# Patient Record
Sex: Female | Born: 1935 | Race: White | Hispanic: No | Marital: Married | Smoking: Former smoker
Health system: Southern US, Community
[De-identification: ages and names within clinical notes are randomized; demographics above are authoritative.]

## PROBLEM LIST (undated history)

## (undated) DIAGNOSIS — D649 Anemia, unspecified: Secondary | ICD-10-CM

## (undated) DIAGNOSIS — F329 Major depressive disorder, single episode, unspecified: Secondary | ICD-10-CM

## (undated) DIAGNOSIS — E538 Deficiency of other specified B group vitamins: Secondary | ICD-10-CM

## (undated) DIAGNOSIS — R7303 Prediabetes: Secondary | ICD-10-CM

## (undated) DIAGNOSIS — D509 Iron deficiency anemia, unspecified: Secondary | ICD-10-CM

## (undated) DIAGNOSIS — E871 Hypo-osmolality and hyponatremia: Secondary | ICD-10-CM

## (undated) DIAGNOSIS — F0390 Unspecified dementia without behavioral disturbance: Secondary | ICD-10-CM

## (undated) DIAGNOSIS — M1990 Unspecified osteoarthritis, unspecified site: Secondary | ICD-10-CM

## (undated) DIAGNOSIS — I10 Essential (primary) hypertension: Secondary | ICD-10-CM

## (undated) HISTORY — PX: COLONOSCOPY: SHX174

## (undated) HISTORY — PX: ABDOMINAL HYSTERECTOMY: SHX81

## (undated) HISTORY — PX: HEMANGIOMA EXCISION: SHX1734

## (undated) HISTORY — DX: Unspecified osteoarthritis, unspecified site: M19.90

## (undated) HISTORY — PX: JOINT REPLACEMENT: SHX530

## (undated) HISTORY — PX: TUBAL LIGATION: SHX77

## (undated) HISTORY — PX: TONSILLECTOMY: SUR1361

---

## 1998-08-11 ENCOUNTER — Ambulatory Visit (HOSPITAL_COMMUNITY): Admission: RE | Admit: 1998-08-11 | Discharge: 1998-08-11 | Payer: Self-pay | Admitting: *Deleted

## 1999-05-16 ENCOUNTER — Ambulatory Visit (HOSPITAL_COMMUNITY): Admission: RE | Admit: 1999-05-16 | Discharge: 1999-05-16 | Payer: Self-pay | Admitting: *Deleted

## 1999-05-25 ENCOUNTER — Ambulatory Visit (HOSPITAL_COMMUNITY): Admission: RE | Admit: 1999-05-25 | Discharge: 1999-05-25 | Payer: Self-pay | Admitting: *Deleted

## 1999-09-09 ENCOUNTER — Ambulatory Visit (HOSPITAL_COMMUNITY): Admission: RE | Admit: 1999-09-09 | Discharge: 1999-09-09 | Payer: Self-pay | Admitting: Emergency Medicine

## 1999-09-09 ENCOUNTER — Encounter: Payer: Self-pay | Admitting: Emergency Medicine

## 2000-01-30 ENCOUNTER — Encounter: Payer: Self-pay | Admitting: Internal Medicine

## 2000-01-30 ENCOUNTER — Ambulatory Visit (HOSPITAL_COMMUNITY): Admission: RE | Admit: 2000-01-30 | Discharge: 2000-01-30 | Payer: Self-pay | Admitting: Internal Medicine

## 2001-05-26 ENCOUNTER — Encounter: Payer: Self-pay | Admitting: Internal Medicine

## 2001-05-26 ENCOUNTER — Ambulatory Visit (HOSPITAL_COMMUNITY): Admission: RE | Admit: 2001-05-26 | Discharge: 2001-05-26 | Payer: Self-pay | Admitting: Internal Medicine

## 2002-03-10 ENCOUNTER — Other Ambulatory Visit: Admission: RE | Admit: 2002-03-10 | Discharge: 2002-03-10 | Payer: Self-pay | Admitting: Internal Medicine

## 2002-08-18 ENCOUNTER — Ambulatory Visit (HOSPITAL_COMMUNITY): Admission: RE | Admit: 2002-08-18 | Discharge: 2002-08-18 | Payer: Self-pay | Admitting: Internal Medicine

## 2002-11-08 ENCOUNTER — Encounter: Payer: Self-pay | Admitting: Emergency Medicine

## 2002-11-08 ENCOUNTER — Emergency Department (HOSPITAL_COMMUNITY): Admission: EM | Admit: 2002-11-08 | Discharge: 2002-11-08 | Payer: Self-pay | Admitting: Emergency Medicine

## 2003-09-09 ENCOUNTER — Ambulatory Visit (HOSPITAL_COMMUNITY): Admission: RE | Admit: 2003-09-09 | Discharge: 2003-09-09 | Payer: Self-pay | Admitting: Internal Medicine

## 2003-09-14 ENCOUNTER — Encounter (INDEPENDENT_AMBULATORY_CARE_PROVIDER_SITE_OTHER): Payer: Self-pay | Admitting: *Deleted

## 2003-09-14 ENCOUNTER — Ambulatory Visit (HOSPITAL_COMMUNITY): Admission: RE | Admit: 2003-09-14 | Discharge: 2003-09-14 | Payer: Self-pay | Admitting: *Deleted

## 2003-11-02 ENCOUNTER — Ambulatory Visit (HOSPITAL_COMMUNITY): Admission: RE | Admit: 2003-11-02 | Discharge: 2003-11-02 | Payer: Self-pay | Admitting: Internal Medicine

## 2004-02-18 ENCOUNTER — Ambulatory Visit (HOSPITAL_COMMUNITY): Admission: RE | Admit: 2004-02-18 | Discharge: 2004-02-18 | Payer: Self-pay | Admitting: Orthopedic Surgery

## 2004-12-12 ENCOUNTER — Ambulatory Visit (HOSPITAL_COMMUNITY): Admission: RE | Admit: 2004-12-12 | Discharge: 2004-12-12 | Payer: Self-pay | Admitting: Internal Medicine

## 2004-12-26 ENCOUNTER — Encounter: Admission: RE | Admit: 2004-12-26 | Discharge: 2004-12-26 | Payer: Self-pay | Admitting: Internal Medicine

## 2005-09-04 ENCOUNTER — Encounter: Admission: RE | Admit: 2005-09-04 | Discharge: 2005-09-04 | Payer: Self-pay | Admitting: Internal Medicine

## 2006-01-15 ENCOUNTER — Ambulatory Visit (HOSPITAL_COMMUNITY): Admission: RE | Admit: 2006-01-15 | Discharge: 2006-01-15 | Payer: Self-pay | Admitting: Internal Medicine

## 2007-02-03 ENCOUNTER — Ambulatory Visit (HOSPITAL_COMMUNITY): Admission: RE | Admit: 2007-02-03 | Discharge: 2007-02-03 | Payer: Self-pay | Admitting: *Deleted

## 2007-03-17 ENCOUNTER — Ambulatory Visit (HOSPITAL_COMMUNITY): Admission: RE | Admit: 2007-03-17 | Discharge: 2007-03-17 | Payer: Self-pay | Admitting: Internal Medicine

## 2010-07-11 NOTE — Op Note (Signed)
Amy Carroll, WENZ            ACCOUNT NO.:  [id number]   MEDICAL RECORD NO.:  [id number]          PATIENT TYPE:  AMB   LOCATION:  ENDO                         FACILITY:  [facility]   PHYSICIAN:  Georgiana Spinner, M.D.    DATE OF BIRTH:  12/05/1934   DATE OF PROCEDURE:  02/03/2007  DATE OF DISCHARGE:                               OPERATIVE REPORT   PROCEDURE:  Colon cancer screening.   ANESTHESIA:  Fentanyl 100 mcg, Versed 8 mg.   PROCEDURE:  With patient mildly sedated in the left lateral decubitus  position, the Pentax videoscopic colonoscope was inserted in the rectum,  passed under direct vision to the cecum, identified by ileocecal valve  and base of cecum, both of which were photographed.  In the cecum were  AVMs that were photographed.  From this point, the endoscope was slowly  withdrawn, taking circumferential views of colonic mucosa, as we  withdrew all the way to the rectum stopping in the sigmoid colon where  diverticulosis was noted and photographed, stopping again in the rectum  which appeared normal on direct and showed hemorrhoids on retroflexed  view.  The endoscope was straightened and withdrawn.  The patient's  vital signs and pulse oximeter remained stable.  The patient tolerated  the procedure well without apparent complications.   FINDINGS:  Diverticulosis of sigmoid colon, AVMs of cecum and internal  hemorrhoids.   PLAN:  Have patient follow-up with me in 5 years or as needed.  Of note,  the patient took half the prep.  There were some areas where small  polyps could be missed because of brownish stool liquid that could be  fully suctioned were encountered.           ______________________________  Georgiana Spinner, M.D.     GMO/MEDQ  D:  02/03/2007  T:  02/03/2007  Job:  [id number]

## 2010-07-14 NOTE — Op Note (Signed)
NAME:  Amy Carroll, Amy Carroll                      ACCOUNT NO.:  [id number]   MEDICAL RECORD NO.:  [id number]                   PATIENT TYPE:  AMB   LOCATION:  ENDO                                 FACILITY:  [facility]   PHYSICIAN:  Georgiana Spinner, M.D.                 DATE OF BIRTH:  1934/10/26   DATE OF PROCEDURE:  09/14/2003  DATE OF DISCHARGE:                                 OPERATIVE REPORT   PROCEDURE:  Colonoscopy.   INDICATIONS FOR PROCEDURE:  Colon polyp.   ANESTHESIA:  Demerol 80, Versed 8 mg.   DESCRIPTION OF PROCEDURE:  The patient mildly sedated in the left lateral  decubitus position, the Olympus videoscopic colonoscope was inserted in the  rectum and passed through a tortuous diverticula filled sigmoid colon to the  cecum.  Identified by base of cecum and ileocecal valve.  Of note, he had  two separate, fairly large, AVMs noted.  These were photographed only since  there was no evidence of bleeding and the colonoscope was then slowly  withdrawn, taking circumferential views of the remaining colonic mucosa,  stopping at 20 cm on the way as we pulled back into the rectum to view a  polyp which was photographed and removed using snare cautery technique,  Setting of 20/200 blended current.  Tissue was retrieved for pathology.  The  endoscope was pulled back to the rectum which appeared normal and showed  small hemorrhoids on retroflexed view.  The endoscope was straightened and  withdrawn.  The patient's vital signs and pulse oximeter remained stable.  The patient tolerated the procedure well without apparent complications.   FINDINGS:  Mild to moderately severe diverticulosis of sigmoid colon,  internal hemorrhoids and polyp at 20 cm from the anal verge removed.  Await  biopsy report.  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   PLAN:  Await biopsy report.  The patient will call me for results and follow  up with me as an  outpatient and we will discuss her problems with her when  we see her as an outpatient.                                               Georgiana Spinner, M.D.    GMO/MEDQ  D:  09/14/2003  T:  09/14/2003  Job:  [id number]

## 2011-03-02 DIAGNOSIS — Z23 Encounter for immunization: Secondary | ICD-10-CM | POA: Diagnosis not present

## 2011-06-12 DIAGNOSIS — H43399 Other vitreous opacities, unspecified eye: Secondary | ICD-10-CM | POA: Diagnosis not present

## 2011-07-09 DIAGNOSIS — Z09 Encounter for follow-up examination after completed treatment for conditions other than malignant neoplasm: Secondary | ICD-10-CM | POA: Diagnosis not present

## 2011-07-09 DIAGNOSIS — N6019 Diffuse cystic mastopathy of unspecified breast: Secondary | ICD-10-CM | POA: Diagnosis not present

## 2011-07-18 DIAGNOSIS — H43399 Other vitreous opacities, unspecified eye: Secondary | ICD-10-CM | POA: Diagnosis not present

## 2011-08-29 DIAGNOSIS — H43399 Other vitreous opacities, unspecified eye: Secondary | ICD-10-CM | POA: Diagnosis not present

## 2011-08-30 DIAGNOSIS — H53029 Refractive amblyopia, unspecified eye: Secondary | ICD-10-CM | POA: Diagnosis not present

## 2011-08-30 DIAGNOSIS — H43819 Vitreous degeneration, unspecified eye: Secondary | ICD-10-CM | POA: Diagnosis not present

## 2011-08-30 DIAGNOSIS — H269 Unspecified cataract: Secondary | ICD-10-CM | POA: Diagnosis not present

## 2011-08-30 DIAGNOSIS — H3530 Unspecified macular degeneration: Secondary | ICD-10-CM | POA: Diagnosis not present

## 2011-12-17 DIAGNOSIS — I10 Essential (primary) hypertension: Secondary | ICD-10-CM | POA: Diagnosis not present

## 2011-12-17 DIAGNOSIS — E7800 Pure hypercholesterolemia, unspecified: Secondary | ICD-10-CM | POA: Diagnosis not present

## 2011-12-20 DIAGNOSIS — Z23 Encounter for immunization: Secondary | ICD-10-CM | POA: Diagnosis not present

## 2011-12-20 DIAGNOSIS — M899 Disorder of bone, unspecified: Secondary | ICD-10-CM | POA: Diagnosis not present

## 2011-12-20 DIAGNOSIS — E559 Vitamin D deficiency, unspecified: Secondary | ICD-10-CM | POA: Diagnosis not present

## 2011-12-20 DIAGNOSIS — Z0000 Encounter for general adult medical examination without abnormal findings: Secondary | ICD-10-CM | POA: Diagnosis not present

## 2012-01-17 DIAGNOSIS — M1710 Unilateral primary osteoarthritis, unspecified knee: Secondary | ICD-10-CM | POA: Diagnosis not present

## 2012-04-03 ENCOUNTER — Other Ambulatory Visit: Payer: Self-pay | Admitting: Orthopedic Surgery

## 2012-04-04 ENCOUNTER — Other Ambulatory Visit: Payer: Self-pay | Admitting: Orthopedic Surgery

## 2012-05-05 DIAGNOSIS — M1710 Unilateral primary osteoarthritis, unspecified knee: Secondary | ICD-10-CM | POA: Diagnosis not present

## 2012-05-09 ENCOUNTER — Encounter (HOSPITAL_COMMUNITY): Payer: Self-pay | Admitting: Pharmacy Technician

## 2012-05-19 MED ORDER — ONDANSETRON HCL 4 MG/2ML IJ SOLN
INTRAMUSCULAR | Status: AC
  Filled 2012-05-19: qty 2

## 2012-05-20 ENCOUNTER — Encounter (HOSPITAL_COMMUNITY)
Admission: RE | Admit: 2012-05-20 | Discharge: 2012-05-20 | Disposition: A | Discharge status: Home / Self Care | Payer: Medicare Other | Source: Ambulatory Visit | Attending: Orthopedic Surgery | Admitting: Orthopedic Surgery

## 2012-05-20 ENCOUNTER — Encounter (HOSPITAL_COMMUNITY): Payer: Self-pay

## 2012-05-20 DIAGNOSIS — M25569 Pain in unspecified knee: Secondary | ICD-10-CM | POA: Diagnosis not present

## 2012-05-20 DIAGNOSIS — Z01812 Encounter for preprocedural laboratory examination: Secondary | ICD-10-CM | POA: Diagnosis not present

## 2012-05-20 DIAGNOSIS — M1710 Unilateral primary osteoarthritis, unspecified knee: Secondary | ICD-10-CM | POA: Diagnosis not present

## 2012-05-20 DIAGNOSIS — Z87891 Personal history of nicotine dependence: Secondary | ICD-10-CM | POA: Diagnosis not present

## 2012-05-20 DIAGNOSIS — I10 Essential (primary) hypertension: Secondary | ICD-10-CM | POA: Diagnosis not present

## 2012-05-20 HISTORY — DX: Essential (primary) hypertension: I10

## 2012-05-20 LAB — BASIC METABOLIC PANEL
GFR calc Af Amer: >90 mL/min (ref >90)
GFR calc non Af Amer: 80 mL/min — ABNORMAL LOW (ref >90)
Glucose, Bld: 89 mg/dL (ref 70–99)
Potassium: 3.4 mEq/L — ABNORMAL LOW (ref 3.5–5.1)
Sodium: 140 mEq/L (ref 135–145)

## 2012-05-20 LAB — ABO/RH: ABO/RH(D): A POS

## 2012-05-20 LAB — URINALYSIS, ROUTINE W REFLEX MICROSCOPIC
Ketones, ur: NEGATIVE mg/dL
Protein, ur: NEGATIVE mg/dL
Urobilinogen, UA: 0.2 mg/dL (ref 0.0–1.0)

## 2012-05-20 LAB — CBC
Hemoglobin: 15 g/dL (ref 12.0–15.0)
Platelets: 246 10*3/uL (ref 150–400)
RBC: 4.35 MIL/uL (ref 3.87–5.11)
WBC: 7.2 10*3/uL (ref 4.0–10.5)

## 2012-05-20 LAB — TYPE AND SCREEN
ABO/RH(D): A POS
Antibody Screen: NEGATIVE

## 2012-05-20 LAB — SURGICAL PCR SCREEN
MRSA, PCR: NEGATIVE
Staphylococcus aureus: NEGATIVE

## 2012-05-20 LAB — URINE MICROSCOPIC-ADD ON

## 2012-05-20 MED ORDER — CHLORHEXIDINE GLUCONATE 4 % EX LIQD: 60.0000 mL | Freq: Once | CUTANEOUS | Status: DC

## 2012-05-20 NOTE — Pre-Procedure Instructions (Signed)
Amy Carroll  05/20/2012   Your procedure is scheduled on:  Tuesday, April 1st  Report to [facility] at 0530 AM.  Call this number if you have problems the morning of surgery: [phone]   Remember:   Do not eat food or drink liquids after midnight.    Take these medicines the morning of surgery with A SIP OF WATER: Sular (Nisoldipine)   Do not wear jewelry, make-up or nail polish.  Do not wear lotions, powders, or perfume, deodorant.  Do not shave 48 hours prior to surgery.  Do not bring valuables to the hospital.  Contacts, dentures or bridgework may not be worn into surgery.  Leave suitcase in the car. After surgery it may be brought to your room.  For patients admitted to the hospital, checkout time is 11:00 AM the day of discharge.   Patients discharged the day of surgery will not be allowed to drive home.   Special Instructions: Shower using CHG 2 nights before surgery and the night before surgery.  If you shower the day of surgery use CHG.  Use special wash - you have one bottle of CHG for all showers.  You should use approximately 1/3 of the bottle for each shower.   Please read over the following fact sheets that you were given: Pain Booklet, Coughing and Deep Breathing, Blood Transfusion Information, MRSA Information and Surgical Site Infection Prevention

## 2012-05-21 LAB — URINE CULTURE

## 2012-05-26 MED ORDER — CEFAZOLIN SODIUM-DEXTROSE 2-3 GM-% IV SOLR
2.0000 g | INTRAVENOUS | Status: AC
  Administered 2012-05-27: 2 g via INTRAVENOUS
  Filled 2012-05-26: qty 50

## 2012-05-26 NOTE — H&P (Signed)
TOTAL KNEE ADMISSION H&P  Patient is being admitted for right total knee arthroplasty.  Subjective:  Chief Complaint:right knee pain.  HPI: Amy Carroll, 78 y.o. female, has a history of pain and functional disability in the right knee due to arthritis and has failed non-surgical conservative treatments for greater than 12 weeks to includeNSAID's and/or analgesics, corticosteriod injections, flexibility and strengthening excercises, use of assistive devices and activity modification.  Onset of symptoms was gradual, starting 7 years ago with gradually worsening course since that time. The patient noted no past surgery on the right knee(s).  Patient currently rates pain in the right knee(s) at 7 out of 10 with activity. Patient has worsening of pain with activity and weight bearing, pain that interferes with activities of daily living, crepitus and joint swelling.  Patient has evidence of subchondral sclerosis and joint space narrowing by imaging studies. This patient has had significant increase in pain leading up to the operation. There is no active infection.  There are no active problems to display for this patient.  Past Medical History  Diagnosis Date  . Hypertension     Past Surgical History  Procedure Laterality Date  . Abdominal hysterectomy      No prescriptions prior to admission   No Known Allergies  History  Substance Use Topics  . Smoking status: Former Games developer  . Smokeless tobacco: Not on file  . Alcohol Use: 0.6 oz/week    1 Glasses of wine per week    No family history on file.   Review of Systems  Constitutional: Negative.   HENT: Negative.   Eyes: Negative.   Respiratory: Negative.   Cardiovascular: Negative.   Gastrointestinal: Negative.   Genitourinary: Negative.   Musculoskeletal: Positive for joint pain.  Skin: Negative.   Neurological: Negative.   Endo/Heme/Allergies: Negative.   Psychiatric/Behavioral: Negative.     Objective:  Physical  Exam  Constitutional: She appears well-developed.  HENT:  Head: Normocephalic.  Eyes: Pupils are equal, round, and reactive to light.  Neck: Normal range of motion.  Cardiovascular: Normal rate.   Respiratory: Effort normal.  GI: Soft.  Neurological: She is alert.  Skin: Skin is warm.  right knee  Good rom - stable collaterals - effusion - dp 2/ 4  - ok sensation dorsal and plantar foot - intact extensor mechanism    Vital signs in last 24 hours:    Labs:   There is no height or weight on file to calculate BMI.   Imaging Review Plain radiographs demonstrate severe degenerative joint disease of the right knee(s). The overall alignment ismild varus. The bone quality appears to be good for age and reported activity level.  Assessment/Plan:  End stage arthritis, right knee   The patient history, physical examination, clinical judgment of the provider and imaging studies are consistent with end stage degenerative joint disease of the right knee(s) and total knee arthroplasty is deemed medically necessary. The treatment options including medical management, injection therapy arthroscopy and arthroplasty were discussed at length. The risks and benefits of total knee arthroplasty were presented and reviewed. The risks due to aseptic loosening, infection, stiffness, patella tracking problems, thromboembolic complications and other imponderables were discussed. The patient acknowledged the explanation, agreed to proceed with the plan and consent was signed. Patient is being admitted for inpatient treatment for surgery, pain control, PT, OT, prophylactic antibiotics, VTE prophylaxis, progressive ambulation and ADL's and discharge planning. The patient is planning to be discharged home with home health services

## 2012-05-27 ENCOUNTER — Encounter (HOSPITAL_COMMUNITY): Payer: Self-pay | Admitting: *Deleted

## 2012-05-27 ENCOUNTER — Inpatient Hospital Stay (HOSPITAL_COMMUNITY)
Admission: RE | Admit: 2012-05-27 | Discharge: 2012-05-30 | DRG: 470 | Disposition: A | Discharge status: Home Health | Payer: Medicare Other | Source: Ambulatory Visit | Attending: Orthopedic Surgery | Admitting: Orthopedic Surgery

## 2012-05-27 ENCOUNTER — Encounter (HOSPITAL_COMMUNITY): Payer: Self-pay | Admitting: Anesthesiology

## 2012-05-27 ENCOUNTER — Encounter (HOSPITAL_COMMUNITY)
Admission: RE | Disposition: A | Discharge status: Home Health | Payer: Self-pay | Source: Ambulatory Visit | Attending: Orthopedic Surgery

## 2012-05-27 ENCOUNTER — Inpatient Hospital Stay (HOSPITAL_COMMUNITY): Payer: Medicare Other | Admitting: Anesthesiology

## 2012-05-27 DIAGNOSIS — Z87891 Personal history of nicotine dependence: Secondary | ICD-10-CM

## 2012-05-27 DIAGNOSIS — M1710 Unilateral primary osteoarthritis, unspecified knee: Principal | ICD-10-CM | POA: Diagnosis present

## 2012-05-27 DIAGNOSIS — Z01812 Encounter for preprocedural laboratory examination: Secondary | ICD-10-CM

## 2012-05-27 DIAGNOSIS — I10 Essential (primary) hypertension: Secondary | ICD-10-CM | POA: Diagnosis not present

## 2012-05-27 DIAGNOSIS — M1711 Unilateral primary osteoarthritis, right knee: Secondary | ICD-10-CM

## 2012-05-27 HISTORY — PX: TOTAL KNEE ARTHROPLASTY: SHX125

## 2012-05-27 SURGERY — ARTHROPLASTY, KNEE, TOTAL
Anesthesia: General | Site: Knee | Laterality: Right | Wound class: Clean

## 2012-05-27 MED ORDER — PROMETHAZINE HCL 25 MG/ML IJ SOLN: 6.2500 mg | INTRAMUSCULAR | Status: DC | PRN

## 2012-05-27 MED ORDER — WARFARIN - PHARMACIST DOSING INPATIENT
Freq: Every day | Status: DC
  Administered 2012-05-29: 18:00:00

## 2012-05-27 MED ORDER — CEFAZOLIN SODIUM 1-5 GM-% IV SOLN
1.0000 g | Freq: Four times a day (QID) | INTRAVENOUS | Status: AC
  Administered 2012-05-27 (×2): 1 g via INTRAVENOUS
  Filled 2012-05-27 (×2): qty 50

## 2012-05-27 MED ORDER — CLONIDINE HCL (ANALGESIA) 100 MCG/ML EP SOLN
150.0000 ug | EPIDURAL | Status: DC
  Filled 2012-05-27: qty 1.5

## 2012-05-27 MED ORDER — CLONIDINE HCL (ANALGESIA) 100 MCG/ML EP SOLN
EPIDURAL | Status: DC | PRN
  Administered 2012-05-27: .75 mL via INTRA_ARTICULAR

## 2012-05-27 MED ORDER — NISOLDIPINE ER 17 MG PO TB24
17.0000 mg | ORAL_TABLET | Freq: Every day | ORAL | Status: DC
  Administered 2012-05-28 – 2012-05-30 (×3): 17 mg via ORAL
  Filled 2012-05-27 (×3): qty 1

## 2012-05-27 MED ORDER — FENTANYL CITRATE 0.05 MG/ML IJ SOLN: 25.0000 ug | INTRAMUSCULAR | Status: DC | PRN

## 2012-05-27 MED ORDER — DIPHENHYDRAMINE HCL 50 MG/ML IJ SOLN: 12.5000 mg | Freq: Four times a day (QID) | INTRAMUSCULAR | Status: DC | PRN

## 2012-05-27 MED ORDER — ONDANSETRON HCL 4 MG/2ML IJ SOLN
INTRAMUSCULAR | Status: DC | PRN
  Administered 2012-05-27: 4 mg via INTRAVENOUS

## 2012-05-27 MED ORDER — ONDANSETRON HCL 4 MG/2ML IJ SOLN: 4.0000 mg | Freq: Four times a day (QID) | INTRAMUSCULAR | Status: DC | PRN

## 2012-05-27 MED ORDER — SODIUM CHLORIDE 0.9 % IR SOLN
Status: DC | PRN
  Administered 2012-05-27: 3000 mL

## 2012-05-27 MED ORDER — KETOROLAC TROMETHAMINE 30 MG/ML IJ SOLN: 15.0000 mg | Freq: Once | INTRAMUSCULAR | Status: DC | PRN

## 2012-05-27 MED ORDER — MORPHINE SULFATE (PF) 1 MG/ML IV SOLN
INTRAVENOUS | Status: AC
  Administered 2012-05-27: 11:00:00
  Filled 2012-05-27: qty 25

## 2012-05-27 MED ORDER — LIDOCAINE HCL (CARDIAC) 20 MG/ML IV SOLN
INTRAVENOUS | Status: DC | PRN
  Administered 2012-05-27: 50 mg via INTRAVENOUS

## 2012-05-27 MED ORDER — ROCURONIUM BROMIDE 100 MG/10ML IV SOLN
INTRAVENOUS | Status: DC | PRN
  Administered 2012-05-27: 40 mg via INTRAVENOUS

## 2012-05-27 MED ORDER — MORPHINE SULFATE (PF) 1 MG/ML IV SOLN
INTRAVENOUS | Status: DC
  Administered 2012-05-27: 3 mg via INTRAVENOUS
  Administered 2012-05-27: 7 mg via INTRAVENOUS
  Administered 2012-05-28: 07:00:00 via INTRAVENOUS
  Administered 2012-05-28: 1 mg via INTRAVENOUS
  Filled 2012-05-27: qty 25

## 2012-05-27 MED ORDER — BUPIVACAINE HCL (PF) 0.5 % IJ SOLN
INTRAMUSCULAR | Status: AC
  Filled 2012-05-27: qty 10

## 2012-05-27 MED ORDER — SODIUM CHLORIDE 0.9 % IJ SOLN: 9.0000 mL | INTRAMUSCULAR | Status: DC | PRN

## 2012-05-27 MED ORDER — MENTHOL 3 MG MT LOZG: 1.0000 | LOZENGE | OROMUCOSAL | Status: DC | PRN

## 2012-05-27 MED ORDER — METHOCARBAMOL 100 MG/ML IJ SOLN
500.0000 mg | Freq: Four times a day (QID) | INTRAVENOUS | Status: DC | PRN
  Filled 2012-05-27: qty 5

## 2012-05-27 MED ORDER — LACTATED RINGERS IV SOLN
INTRAVENOUS | Status: DC | PRN
  Administered 2012-05-27 (×2): via INTRAVENOUS

## 2012-05-27 MED ORDER — MIDAZOLAM HCL 5 MG/5ML IJ SOLN
INTRAMUSCULAR | Status: DC | PRN
  Administered 2012-05-27: 2 mg via INTRAVENOUS

## 2012-05-27 MED ORDER — HYDROCHLOROTHIAZIDE 25 MG PO TABS
25.0000 mg | ORAL_TABLET | Freq: Every day | ORAL | Status: DC
  Administered 2012-05-27 – 2012-05-30 (×4): 25 mg via ORAL
  Filled 2012-05-27 (×4): qty 1

## 2012-05-27 MED ORDER — ONDANSETRON HCL 4 MG PO TABS
4.0000 mg | ORAL_TABLET | Freq: Four times a day (QID) | ORAL | Status: DC | PRN
  Administered 2012-05-28: 4 mg via ORAL
  Filled 2012-05-27: qty 1

## 2012-05-27 MED ORDER — METOCLOPRAMIDE HCL 10 MG PO TABS: 5.0000 mg | ORAL_TABLET | Freq: Three times a day (TID) | ORAL | Status: DC | PRN

## 2012-05-27 MED ORDER — DOCUSATE SODIUM 100 MG PO CAPS
100.0000 mg | ORAL_CAPSULE | Freq: Two times a day (BID) | ORAL | Status: DC
  Administered 2012-05-27 – 2012-05-29 (×4): 100 mg via ORAL
  Filled 2012-05-27 (×7): qty 1

## 2012-05-27 MED ORDER — KCL IN DEXTROSE-NACL 20-5-0.45 MEQ/L-%-% IV SOLN
INTRAVENOUS | Status: DC
  Administered 2012-05-27 – 2012-05-28 (×2): via INTRAVENOUS
  Filled 2012-05-27 (×5): qty 1000

## 2012-05-27 MED ORDER — METOCLOPRAMIDE HCL 5 MG/ML IJ SOLN: 5.0000 mg | Freq: Three times a day (TID) | INTRAMUSCULAR | Status: DC | PRN

## 2012-05-27 MED ORDER — NALOXONE HCL 0.4 MG/ML IJ SOLN: 0.4000 mg | INTRAMUSCULAR | Status: DC | PRN

## 2012-05-27 MED ORDER — IRBESARTAN 300 MG PO TABS
300.0000 mg | ORAL_TABLET | Freq: Every day | ORAL | Status: DC
  Administered 2012-05-27 – 2012-05-29 (×3): 300 mg via ORAL
  Filled 2012-05-27 (×4): qty 1

## 2012-05-27 MED ORDER — PROPOFOL 10 MG/ML IV BOLUS
INTRAVENOUS | Status: DC | PRN
  Administered 2012-05-27: 140 mg via INTRAVENOUS

## 2012-05-27 MED ORDER — FENTANYL CITRATE 0.05 MG/ML IJ SOLN
INTRAMUSCULAR | Status: DC | PRN
  Administered 2012-05-27 (×7): 50 ug via INTRAVENOUS

## 2012-05-27 MED ORDER — ZOLPIDEM TARTRATE 5 MG PO TABS: 5.0000 mg | ORAL_TABLET | Freq: Every evening | ORAL | Status: DC | PRN

## 2012-05-27 MED ORDER — BISACODYL 10 MG RE SUPP: 10.0000 mg | Freq: Every day | RECTAL | Status: DC | PRN

## 2012-05-27 MED ORDER — 0.9 % SODIUM CHLORIDE (POUR BTL) OPTIME
TOPICAL | Status: DC | PRN
  Administered 2012-05-27 (×2): 1000 mL

## 2012-05-27 MED ORDER — OXYCODONE HCL 5 MG PO TABS
5.0000 mg | ORAL_TABLET | ORAL | Status: DC | PRN
  Administered 2012-05-28 – 2012-05-30 (×11): 10 mg via ORAL
  Filled 2012-05-27 (×13): qty 2

## 2012-05-27 MED ORDER — TELMISARTAN-HCTZ 80-25 MG PO TABS: 1.0000 | ORAL_TABLET | Freq: Every day | ORAL | Status: DC

## 2012-05-27 MED ORDER — BUPIVACAINE HCL (PF) 0.5 % IJ SOLN
INTRAMUSCULAR | Status: AC
  Filled 2012-05-27: qty 30

## 2012-05-27 MED ORDER — MIDAZOLAM HCL 2 MG/2ML IJ SOLN: 0.5000 mg | Freq: Once | INTRAMUSCULAR | Status: DC | PRN

## 2012-05-27 MED ORDER — DIPHENHYDRAMINE HCL 12.5 MG/5ML PO ELIX: 12.5000 mg | ORAL_SOLUTION | Freq: Four times a day (QID) | ORAL | Status: DC | PRN

## 2012-05-27 MED ORDER — MORPHINE SULFATE 4 MG/ML IJ SOLN
INTRAMUSCULAR | Status: AC
  Filled 2012-05-27: qty 1

## 2012-05-27 MED ORDER — ACETAMINOPHEN 650 MG RE SUPP: 650.0000 mg | Freq: Four times a day (QID) | RECTAL | Status: DC | PRN

## 2012-05-27 MED ORDER — MEPERIDINE HCL 25 MG/ML IJ SOLN: 6.2500 mg | INTRAMUSCULAR | Status: DC | PRN

## 2012-05-27 MED ORDER — ACETAMINOPHEN 325 MG PO TABS
650.0000 mg | ORAL_TABLET | Freq: Four times a day (QID) | ORAL | Status: DC | PRN
  Administered 2012-05-27 (×2): 650 mg via ORAL
  Filled 2012-05-27 (×2): qty 2

## 2012-05-27 MED ORDER — NISOLDIPINE ER 20 MG PO TB24: 20.0000 mg | ORAL_TABLET | Freq: Every day | ORAL | Status: DC

## 2012-05-27 MED ORDER — METHOCARBAMOL 500 MG PO TABS
500.0000 mg | ORAL_TABLET | Freq: Four times a day (QID) | ORAL | Status: DC | PRN
  Administered 2012-05-27 – 2012-05-29 (×9): 500 mg via ORAL
  Filled 2012-05-27 (×12): qty 1

## 2012-05-27 MED ORDER — WARFARIN VIDEO: 1.0000 | Freq: Once | Status: DC

## 2012-05-27 MED ORDER — WARFARIN SODIUM 5 MG PO TABS
5.0000 mg | ORAL_TABLET | Freq: Once | ORAL | Status: AC
  Administered 2012-05-27: 5 mg via ORAL
  Filled 2012-05-27: qty 1

## 2012-05-27 MED ORDER — FENTANYL CITRATE 0.05 MG/ML IJ SOLN
INTRAMUSCULAR | Status: AC
  Administered 2012-05-27: 25 ug
  Filled 2012-05-27: qty 2

## 2012-05-27 MED ORDER — MORPHINE SULFATE 4 MG/ML IJ SOLN
INTRAMUSCULAR | Status: DC | PRN
  Administered 2012-05-27: 4 mg via SUBCUTANEOUS

## 2012-05-27 MED ORDER — COUMADIN BOOK
1.0000 | Freq: Once | Status: DC
  Filled 2012-05-27: qty 1

## 2012-05-27 MED ORDER — BUPIVACAINE HCL (PF) 0.25 % IJ SOLN
INTRAMUSCULAR | Status: AC
  Filled 2012-05-27: qty 30

## 2012-05-27 MED ORDER — BUPIVACAINE HCL (PF) 0.25 % IJ SOLN
INTRAMUSCULAR | Status: DC | PRN
  Administered 2012-05-27: 15 mL

## 2012-05-27 MED ORDER — FLEET ENEMA 7-19 GM/118ML RE ENEM: 1.0000 | ENEMA | Freq: Once | RECTAL | Status: AC | PRN

## 2012-05-27 MED ORDER — DIPHENHYDRAMINE HCL 12.5 MG/5ML PO ELIX: 12.5000 mg | ORAL_SOLUTION | ORAL | Status: DC | PRN

## 2012-05-27 MED ORDER — SENNOSIDES-DOCUSATE SODIUM 8.6-50 MG PO TABS: 1.0000 | ORAL_TABLET | Freq: Every evening | ORAL | Status: DC | PRN

## 2012-05-27 MED ORDER — PHENOL 1.4 % MT LIQD: 1.0000 | OROMUCOSAL | Status: DC | PRN

## 2012-05-27 SURGICAL SUPPLY — 83 items
BANDAGE ELASTIC 4 VELCRO ST LF (GAUZE/BANDAGES/DRESSINGS) ×2 IMPLANT
BANDAGE ESMARK 6X9 LF (GAUZE/BANDAGES/DRESSINGS) ×1 IMPLANT
BLADE SAG 18X100X1.27 (BLADE) ×2 IMPLANT
BLADE SAW SGTL 13.0X1.19X90.0M (BLADE) ×2 IMPLANT
BNDG COHESIVE 6X5 TAN STRL LF (GAUZE/BANDAGES/DRESSINGS) ×2 IMPLANT
BNDG ELASTIC 6X10 VLCR STRL LF (GAUZE/BANDAGES/DRESSINGS) ×2 IMPLANT
BNDG ESMARK 6X9 LF (GAUZE/BANDAGES/DRESSINGS) ×2
BOWL SMART MIX CTS (DISPOSABLE) ×2 IMPLANT
CEMENT BONE SIMPLEX SPEEDSET (Cement) ×4 IMPLANT
CLOTH BEACON ORANGE TIMEOUT ST (SAFETY) ×2 IMPLANT
COVER SURGICAL LIGHT HANDLE (MISCELLANEOUS) ×2 IMPLANT
CUFF TOURNIQUET SINGLE 34IN LL (TOURNIQUET CUFF) ×2 IMPLANT
CUFF TOURNIQUET SINGLE 44IN (TOURNIQUET CUFF) IMPLANT
DRAPE INCISE IOBAN 66X45 STRL (DRAPES) IMPLANT
DRAPE ORTHO SPLIT 77X108 STRL (DRAPES) ×3
DRAPE SURG ORHT 6 SPLT 77X108 (DRAPES) ×3 IMPLANT
DRAPE U-SHAPE 47X51 STRL (DRAPES) ×2 IMPLANT
DRAPE X-RAY CASS 24X20 (DRAPES) IMPLANT
DRSG PAD ABDOMINAL 8X10 ST (GAUZE/BANDAGES/DRESSINGS) ×4 IMPLANT
DURAPREP 26ML APPLICATOR (WOUND CARE) ×2 IMPLANT
ELECT REM PT RETURN 9FT ADLT (ELECTROSURGICAL) ×2
ELECTRODE REM PT RTRN 9FT ADLT (ELECTROSURGICAL) ×1 IMPLANT
EVACUATOR 1/8 PVC DRAIN (DRAIN) ×2 IMPLANT
FACESHIELD LNG OPTICON STERILE (SAFETY) IMPLANT
GAUZE XEROFORM 5X9 LF (GAUZE/BANDAGES/DRESSINGS) ×2 IMPLANT
GLOVE BIO SURGEON ST LM GN SZ9 (GLOVE) IMPLANT
GLOVE BIO SURGEON STRL SZ7.5 (GLOVE) ×4 IMPLANT
GLOVE BIO SURGEON STRL SZ8.5 (GLOVE) ×2 IMPLANT
GLOVE BIOGEL PI IND STRL 7.5 (GLOVE) ×1 IMPLANT
GLOVE BIOGEL PI IND STRL 8 (GLOVE) IMPLANT
GLOVE BIOGEL PI IND STRL 8.5 (GLOVE) ×1 IMPLANT
GLOVE BIOGEL PI INDICATOR 7.5 (GLOVE) ×1
GLOVE BIOGEL PI INDICATOR 8 (GLOVE)
GLOVE BIOGEL PI INDICATOR 8.5 (GLOVE) ×1
GLOVE ECLIPSE 7.0 STRL STRAW (GLOVE) ×4 IMPLANT
GLOVE SURG 8.5 LATEX PF (GLOVE) ×2 IMPLANT
GLOVE SURG ORTHO 8.0 STRL STRW (GLOVE) ×8 IMPLANT
GLOVE SURG SS PI 6.5 STRL IVOR (GLOVE) ×2 IMPLANT
GOWN PREVENTION PLUS LG XLONG (DISPOSABLE) ×2 IMPLANT
GOWN PREVENTION PLUS XLARGE (GOWN DISPOSABLE) ×2 IMPLANT
GOWN SRG XL XLNG 56XLVL 4 (GOWN DISPOSABLE) ×1 IMPLANT
GOWN STRL NON-REIN LRG LVL3 (GOWN DISPOSABLE) IMPLANT
GOWN STRL NON-REIN XL XLG LVL4 (GOWN DISPOSABLE) ×1
GOWN STRL REIN 2XL XLG LVL4 (GOWN DISPOSABLE) ×2 IMPLANT
HANDPIECE INTERPULSE COAX TIP (DISPOSABLE) ×1
HOOD PEEL AWAY FACE SHEILD DIS (HOOD) ×8 IMPLANT
IMMOBILIZER KNEE 20 (SOFTGOODS)
IMMOBILIZER KNEE 20 THIGH 36 (SOFTGOODS) IMPLANT
IMMOBILIZER KNEE 22 UNIV (SOFTGOODS) ×2 IMPLANT
IMMOBILIZER KNEE 24 THIGH 36 (MISCELLANEOUS) IMPLANT
IMMOBILIZER KNEE 24 UNIV (MISCELLANEOUS)
KIT BASIN OR (CUSTOM PROCEDURE TRAY) ×2 IMPLANT
KIT ROOM TURNOVER OR (KITS) ×2 IMPLANT
MANIFOLD NEPTUNE II (INSTRUMENTS) ×2 IMPLANT
MARKER SPHERE PSV REFLC THRD 5 (MARKER) IMPLANT
NEEDLE 18GX1X1/2 (RX/OR ONLY) (NEEDLE) ×2 IMPLANT
NEEDLE SPNL 18GX3.5 QUINCKE PK (NEEDLE) ×2 IMPLANT
NS IRRIG 1000ML POUR BTL (IV SOLUTION) ×4 IMPLANT
PACK TOTAL JOINT (CUSTOM PROCEDURE TRAY) ×2 IMPLANT
PAD ARMBOARD 7.5X6 YLW CONV (MISCELLANEOUS) ×2 IMPLANT
PAD CAST 4YDX4 CTTN HI CHSV (CAST SUPPLIES) ×1 IMPLANT
PADDING CAST COTTON 4X4 STRL (CAST SUPPLIES) ×1
PADDING CAST COTTON 6X4 STRL (CAST SUPPLIES) ×4 IMPLANT
PATELLA TRIATHLON SZ 29 9 MM (Orthopedic Implant) IMPLANT
PIN SCHANZ 4MM 130MM (PIN) IMPLANT
RUBBERBAND STERILE (MISCELLANEOUS) ×2 IMPLANT
SET HNDPC FAN SPRY TIP SCT (DISPOSABLE) ×1 IMPLANT
SPONGE GAUZE 4X4 12PLY (GAUZE/BANDAGES/DRESSINGS) ×2 IMPLANT
SPONGE LAP 18X18 X RAY DECT (DISPOSABLE) ×2 IMPLANT
STAPLER VISISTAT 35W (STAPLE) ×2 IMPLANT
SUCTION FRAZIER TIP 10 FR DISP (SUCTIONS) ×2 IMPLANT
SUT ETHILON 3 0 PS 1 (SUTURE) ×2 IMPLANT
SUT VIC AB 0 CTB1 27 (SUTURE) ×4 IMPLANT
SUT VIC AB 1 CT1 27 (SUTURE) ×5
SUT VIC AB 1 CT1 27XBRD ANBCTR (SUTURE) ×5 IMPLANT
SUT VIC AB 2-0 CT1 27 (SUTURE) ×3
SUT VIC AB 2-0 CT1 TAPERPNT 27 (SUTURE) ×3 IMPLANT
SYR 30ML SLIP (SYRINGE) ×2 IMPLANT
SYR TB 1ML LUER SLIP (SYRINGE) ×2 IMPLANT
TOWEL OR 17X24 6PK STRL BLUE (TOWEL DISPOSABLE) ×2 IMPLANT
TOWEL OR 17X26 10 PK STRL BLUE (TOWEL DISPOSABLE) ×6 IMPLANT
TRAY FOLEY CATH 14FR (SET/KITS/TRAYS/PACK) ×2 IMPLANT
WATER STERILE IRR 1000ML POUR (IV SOLUTION) ×2 IMPLANT

## 2012-05-27 NOTE — Anesthesia Postprocedure Evaluation (Signed)
  Anesthesia Post Note  Patient: Amy Carroll  Procedure(s) Performed: Procedure(s) (LRB): TOTAL KNEE ARTHROPLASTY (Right)  Anesthesia type: GA  Patient location: PACU  Post pain: Pain level controlled  Post assessment: Post-op Vital signs reviewed  Last Vitals:  Filed Vitals:   05/27/12 1045  BP: 145/70  Pulse: 91  Temp:   Resp: 11    Post vital signs: Reviewed  Level of consciousness: sedated  Complications: No apparent anesthesia complications

## 2012-05-27 NOTE — Progress Notes (Signed)
Report given to kay rn as caregiver 

## 2012-05-27 NOTE — Evaluation (Signed)
Physical Therapy Evaluation Patient Details Name: Amy Carroll MRN: [id number] DOB: 26-Sep-1934 Today's Date: 05/27/2012 Time: 9147-8295 PT Time Calculation (min): 48 min  PT Assessment / Plan / Recommendation Clinical Impression  Amy Carroll is a 78 y/o female s/p R tka.  Pt will benefit from acute PT intervention to maximize fundctional moiblity for safe discharge to home with spouse.      PT Assessment  Patient needs continued PT services    Follow Up Recommendations  Home health PT    Does the patient have the potential to tolerate intense rehabilitation      Barriers to Discharge None      Equipment Recommendations  None recommended by PT    Recommendations for Other Services     Frequency 7X/week    Precautions / Restrictions Precautions Precautions: Fall Restrictions Weight Bearing Restrictions: Yes RLE Weight Bearing: Weight bearing as tolerated   Pertinent Vitals/Pain 5/10 pain in knee.  C/o mild nausea during session but resolved in sitting. Pt using PCA for pain management.       Mobility  Bed Mobility Bed Mobility: Supine to Sit;Sitting - Scoot to Edge of Bed Supine to Sit: 4: Min assist;HOB flat Sitting - Scoot to [employer] of Bed: 4: Min assist Details for Bed Mobility Assistance: VCs for technique and use of rail,  assist for R LE. Transfers Transfers: Sit to Stand;Stand to [employer] Transfers Sit to Stand: 4: Min assist;From chair/3-in-1;With upper extremity assist;From elevated surface Stand to Sit: 4: Min assist;To chair/3-in-1;With upper extremity assist Stand Pivot Transfers: 3: Mod assist Details for Transfer Assistance: step by step verbal and tactile cueing.  Required assistance to steady pt during stand pivot transfer secondary to pain and weakness in RLE.   Ambulation/Gait Ambulation/Gait Assistance: Not tested (comment) Stairs: No Wheelchair Mobility Wheelchair Mobility: No    Exercises Total Joint Exercises Ankle  Circles/Pumps: Left;10 reps   PT Diagnosis: Difficulty walking;Generalized weakness;Acute pain  PT Problem List: Decreased strength;Decreased range of motion;Decreased activity tolerance;Decreased mobility;Decreased knowledge of use of DME;Pain PT Treatment Interventions: DME instruction;Gait training;Stair training;Functional mobility training;Therapeutic activities;Therapeutic exercise;Patient/family education   PT Goals Acute Rehab PT Goals PT Goal Formulation: With patient Time For Goal Achievement: 06/10/12 Potential to Achieve Goals: Good Pt will go Supine/Side to Sit: with modified independence PT Goal: Supine/Side to Sit - Progress: Goal set today Pt will go Sit to Supine/Side: with modified independence PT Goal: Sit to Supine/Side - Progress: Goal set today Pt will Transfer Bed to Chair/Chair to Bed: with modified independence PT Transfer Goal: Bed to Chair/Chair to Bed - Progress: Goal set today Pt will Ambulate: 51 - 150 feet;with supervision;with rolling walker PT Goal: Ambulate - Progress: Goal set today Pt will Go Up / Down Stairs: 6-9 stairs;with supervision;with least restrictive assistive device PT Goal: Up/Down Stairs - Progress: Goal set today Pt will Perform Home Exercise Program: with supervision, verbal cues required/provided PT Goal: Perform Home Exercise Program - Progress: Goal set today  Visit Information  Last PT Received On: 05/27/12 Assistance Needed: +1    Subjective Data  Subjective: Agree to PT eval   Prior Functioning  Home Living Lives With: Spouse Available Help at Discharge: Available 24 hours/day;Family Type of Home: House Home Access: Stairs to enter [employer] of Steps: 6 Entrance Stairs-Rails: Right;Left;Can reach both Home Layout: Two level;Bed/bath upstairs Bathroom Shower/Tub: Tub/shower unit Home Adaptive Equipment: Bedside commode/3-in-1;Walker - rolling Prior Function Level of Independence: Independent Able to Take  Stairs?: Yes Driving: Yes Vocation:  Retired    Copywriter, advertising Overall Cognitive Status: Appears within functional limits for tasks assessed/performed Arousal/Alertness: Awake/alert Orientation Level: Oriented X4 / Intact Behavior During Session: WFL for tasks performed    Extremity/Trunk Assessment Right Lower Extremity Assessment RLE ROM/Strength/Tone: Unable to fully assess;Due to pain Left Lower Extremity Assessment LLE ROM/Strength/Tone: Within functional levels   Balance    End of Session PT - End of Session Equipment Utilized During Treatment: Gait belt;Right knee immobilizer Activity Tolerance: Patient limited by fatigue;Patient limited by pain Patient left: in chair;with call bell/phone within reach;with family/visitor present Nurse Communication: Mobility status;Weight bearing status CPM Right Knee CPM Right Knee: Off  GP     Amy Carroll 05/27/2012, 5:13 PM Amy Carroll DPT [phone]

## 2012-05-27 NOTE — Consult Note (Signed)
ANTICOAGULATION CONSULT NOTE - Initial Consult  Pharmacy Consult for Coumadin Indication: VTE prophylaxis s/p R-TKA  Allergies: No Known Allergies  Height/Weight: Height: 5' 3.39" (161 cm) Weight: 154 lb 5.2 oz (70 kg) IBW/kg (Calculated) : 53.29  Vital Signs: BP 126/55  Pulse 84  Temp(Src) 97.6 F (36.4 C) (Oral)  Resp 14  Ht 5' 3.39" (1.61 m)  Wt 154 lb 5.2 oz (70 kg)  BMI 27.01 kg/m2  SpO2 97%  Active Problems: Principal Problem:   Osteoarthritis of right knee   Labs:  Lab Results  Component Value Date   INR 0.88 05/20/2012   Estimated Creatinine Clearance: 57.6 ml/min (by C-G formula based on Cr of 0.77).  Medical / Surgical History: Past Medical History  Diagnosis Date  . Hypertension    Past Surgical History  Procedure Laterality Date  . Abdominal hysterectomy      Medications:  Prescriptions prior to admission  Medication Sig Dispense Refill  . nisoldipine (SULAR) 20 MG 24 hr tablet Take 20 mg by mouth daily.      Marland Kitchen telmisartan-hydrochlorothiazide (MICARDIS HCT) 80-25 MG per tablet Take 1 tablet by mouth daily.       Scheduled:  .  ceFAZolin (ANCEF) IV  1 g Intravenous Q6H  . [COMPLETED]  ceFAZolin (ANCEF) IV  2 g Intravenous On Call to OR  . docusate sodium  100 mg Oral BID  . [COMPLETED] fentaNYL      . hydrochlorothiazide  25 mg Oral Daily  . irbesartan  300 mg Oral Daily  . morphine   Intravenous Q4H  . [COMPLETED] morphine      . [START ON 05/28/2012] nisoldipine  17 mg Oral Daily  . [DISCONTINUED] cloNIDine  150 mcg Epidural To OR  . [DISCONTINUED] nisoldipine  20 mg Oral Daily  . [DISCONTINUED] telmisartan-hydrochlorothiazide  1 tablet Oral Daily    Assessment:  78 y.o.female with Osteoarthritis of right knee, now s/p R-TKA who will be started on Coumadin for VTE prophylaxis .  Coumadin score of 2.  Hgb 15.  Baseline INR 0.88.   Goal of Therapy:     Target INR of 2-3   Plan:   Coumadin 5 mg po today. Coumadin discharge  education started.  Daily INR's, CBC.   Dimitris Shanahan, Elisha Headland,  Pharm.D.. 05/27/2012,  2:39 PM

## 2012-05-27 NOTE — Brief Op Note (Signed)
05/27/2012  10:32 AM  PATIENT:  Amy Carroll  78 y.o. female  PRE-OPERATIVE DIAGNOSIS:  Right Knee Osteoarthrosis  POST-OPERATIVE DIAGNOSIS:  Right Knee Osteoarthrosis  PROCEDURE:  Procedure(s): TOTAL KNEE ARTHROPLASTY  SURGEON:  Surgeon(s): Cammy Copa, MD  ASSISTANT: Jodene Nam  ANESTHESIA:   general  EBL: 100 ml    Total I/O In: 1500 [I.V.:1500] Out: 300 [Urine:200; Blood:100]  BLOOD ADMINISTERED: none  DRAINS: (right) Hemovact drain(s) in the knee with  Suction Clamped   LOCAL MEDICATIONS USED:  none  SPECIMEN:  No Specimen  COUNTS:  YES  TOURNIQUET:   Total Tourniquet Time Documented: Thigh (Right) - 92 minutes Total: Thigh (Right) - 92 minutes   DICTATION: .Other Dictation: Dictation Number [id number]  PLAN OF CARE: Admit to inpatient   PATIENT DISPOSITION:  PACU - hemodynamically stable

## 2012-05-27 NOTE — Transfer of Care (Signed)
Immediate Anesthesia Transfer of Care Note  Patient: Amy Carroll  Procedure(s) Performed: Procedure(s) with comments: TOTAL KNEE ARTHROPLASTY (Right) - Right Total Knee Arthroplasty  Patient Location: PACU  Anesthesia Type:General  Level of Consciousness: awake, alert  and oriented  Airway & Oxygen Therapy: Patient Spontanous Breathing and Patient connected to nasal cannula oxygen  Post-op Assessment: Report given to PACU RN, Post -op Vital signs reviewed and stable and Patient moving all extremities  Post vital signs: Reviewed and stable  Complications: No apparent anesthesia complications

## 2012-05-27 NOTE — Anesthesia Preprocedure Evaluation (Signed)
Anesthesia Evaluation  Patient identified by MRN, date of birth, ID band Patient awake    Reviewed: Allergy & Precautions, H&P , Patient's Chart, lab work & pertinent test results, reviewed documented beta blocker date and time   History of Anesthesia Complications Negative for: history of anesthetic complications  Airway Mallampati: II TM Distance: >3 FB Neck ROM: full    Dental no notable dental hx.    Pulmonary neg pulmonary ROS,  breath sounds clear to auscultation  Pulmonary exam normal       Cardiovascular Exercise Tolerance: Good hypertension, negative cardio ROS  Rhythm:regular Rate:Normal     Neuro/Psych negative neurological ROS  negative psych ROS   GI/Hepatic negative GI ROS, Neg liver ROS,   Endo/Other  negative endocrine ROS  Renal/GU negative Renal ROS     Musculoskeletal   Abdominal   Peds  Hematology negative hematology ROS (+)   Anesthesia Other Findings   Reproductive/Obstetrics negative OB ROS                           Anesthesia Physical Anesthesia Plan  ASA: II  Anesthesia Plan: General ETT   Post-op Pain Management:    Induction:   Airway Management Planned:   Additional Equipment:   Intra-op Plan:   Post-operative Plan:   Informed Consent: I have reviewed the patients History and Physical, chart, labs and discussed the procedure including the risks, benefits and alternatives for the proposed anesthesia with the patient or authorized representative who has indicated his/her understanding and acceptance.   Dental Advisory Given  Plan Discussed with: CRNA, Surgeon and Anesthesiologist  Anesthesia Plan Comments:         Anesthesia Quick Evaluation

## 2012-05-27 NOTE — Preoperative (Signed)
Beta Blockers   Reason not to administer Beta Blockers:Not Applicable 

## 2012-05-27 NOTE — Progress Notes (Signed)
Orthopedic Tech Progress Note Patient Details:  Amy Carroll [birth year] [id number]  CPM Right Knee CPM Right Knee: On Right Knee Flexion (Degrees): 40 Right Knee Extension (Degrees): 0 Additional Comments: trapeze bar   Amy Carroll, Amy Carroll 05/27/2012, 11:39 AM

## 2012-05-27 NOTE — Interval H&P Note (Signed)
History and Physical Interval Note:  05/27/2012 10:32 AM  Amy Carroll  has presented today for surgery, with the diagnosis of Right Knee Osteoarthrosis  The various methods of treatment have been discussed with the patient and family. After consideration of risks, benefits and other options for treatment, the patient has consented to  Procedure(s) with comments: TOTAL KNEE ARTHROPLASTY (Right) - Right Total Knee Arthroplasty as a surgical intervention .  The patient's history has been reviewed, patient examined, no change in status, stable for surgery.  I have reviewed the patient's chart and labs.  Questions were answered to the patient's satisfaction.     DEAN,GREGORY SCOTT

## 2012-05-27 NOTE — Progress Notes (Signed)
UR COMPLETED  

## 2012-05-28 ENCOUNTER — Encounter (HOSPITAL_COMMUNITY): Payer: Self-pay | Admitting: Orthopedic Surgery

## 2012-05-28 LAB — CBC
MCH: 34 pg (ref 26.0–34.0)
MCV: 97.3 fL (ref 78.0–100.0)
Platelets: 192 10*3/uL (ref 150–400)
RBC: 3.29 MIL/uL — ABNORMAL LOW (ref 3.87–5.11)

## 2012-05-28 LAB — BASIC METABOLIC PANEL
CO2: 31 mEq/L (ref 19–32)
Calcium: 8.2 mg/dL — ABNORMAL LOW (ref 8.4–10.5)
Creatinine, Ser: 0.63 mg/dL (ref 0.50–1.10)
Glucose, Bld: 137 mg/dL — ABNORMAL HIGH (ref 70–99)

## 2012-05-28 MED ORDER — WARFARIN SODIUM 5 MG PO TABS
5.0000 mg | ORAL_TABLET | Freq: Once | ORAL | Status: AC
  Administered 2012-05-28: 5 mg via ORAL
  Filled 2012-05-28: qty 1

## 2012-05-28 NOTE — Progress Notes (Signed)
Seen and agreed 05/28/2012 Fredrich Birks PTA [phone] pager [phone] office

## 2012-05-28 NOTE — Op Note (Signed)
[name], STEHLE            ACCOUNT NO.:  [id number]  MEDICAL RECORD NO.:  [id number]  LOCATION:  5N30C                        FACILITY:  [facility]  PHYSICIAN:  Burnard Bunting, M.D.    DATE OF BIRTH:  [birth year]  DATE OF PROCEDURE:  05/27/2012 DATE OF DISCHARGE:                              OPERATIVE REPORT   PREOPERATIVE DIAGNOSIS:  Right knee arthritis.  POSTOPERATIVE DIAGNOSIS:  Right knee arthritis.  PROCEDURE:  Right total knee replacement.  SURGEON:  Burnard Bunting, M.D.  ASSISTANT:  Wende Neighbors, PA-C  ANESTHESIA:  General endotracheal.  ESTIMATED BLOOD LOSS:  100 mL.  DRAINS:  Hemovac x1.  TOURNIQUET TIME:  Approximately, an hour and 36 minutes at 3 mmHg.  INDICATIONS:  Amy Carroll is a patient with end-stage right knee arthritis, who presents for total knee replacement after explanation of risks and benefits.  PROCEDURE IN DETAIL:  The patient was brought to the operating room, where general endotracheal anesthesia was induced.  Preoperative antibiotics were administered.  Time-out was called.  Right leg was prescrubbed with alcohol and Betadine, which was allowed to dry, prepped with DuraPrep solution and draped in sterile manner.  Collier Flowers was used to cover the operative field.  Leg was elevated, exsanguinated with Esmarch.  Tourniquet was inflated.  Stryker components were used to track the 1 knee size, 4 tibia base plate, 9 mm posterior cruciate size 3 femur posterior cruciate sacrificing, 9 mm poly, and 28 mm 3 PEG patella.  Following exposure of lateral patellofemoral ligament was released, soft tissue was elevated off the proximal medial tibia because of her preop varus deformity.  Fat tissue was removed from the anterior distal femur.  At this time, intramedullary alignment was used to make a tibial cut perpendicular mechanical axis of the tibia taking 9 off the least affected lateral side.  Distal femoral cut was then made 10 mm. This did  except a  9 mm spacer.  Form 1 and box cut was then performed. Tibia was then keel punched align with the middle third of the tibial tubercle.  Patella was then prepared freehand cutting from 20-13 offset, 20 mm patella was then drilled.  Trial components were placed and the patient had excellent stability.  Full extension and good flexion with stability varus valgus stress at 0-39 degrees and excellent patellar tracking with no thumbs technique.  Trial components were removed. Thorough irrigation performed.  True components cemented into position with excess cement removed.  Same stability and range of motion parameters were maintained.  Two 9 mm spacer placed after tourniquet released and bleeding points were controlled using electrocautery. Thorough irrigation again performed and the incision was closed over bolster using #1 Vicryl suture, reapproximated the arthrotomy which had been marked at the time of the initial approach with a #1 Vicryl suture, and then this was followed by interrupted inverted 0 Vicryl suture, 2-0 Vicryl suture, and skin staples.  Solution of Marcaine, morphine, and clonidine was injected into the knee.  Hemovac drain was placed.  This will be removed tomorrow.  Bulky dressing was placed Velna Hatchet Vernon's assistance prior all times during the case for retraction of important neurovascular structures, opening and closing, her assistance  was medical necessity.     Burnard Bunting, M.D.     GSD/MEDQ  D:  05/27/2012  T:  05/28/2012  Job:  [id number]

## 2012-05-28 NOTE — Progress Notes (Signed)
Subjective: Pt stable - pain controlled   Objective: Vital signs in last 24 hours: Temp:  [97.6 F (36.4 C)-98.8 F (37.1 C)] 98.8 F (37.1 C) (04/02 0637) Pulse Rate:  [79-86] 85 (04/02 0637) Resp:  [14-18] 16 (04/02 0646) BP: (120-137)/(52-61) 120/55 mmHg (04/02 0637) SpO2:  [94 %-98 %] 97 % (04/02 0646) Weight:  [70 kg (154 lb 5.2 oz)] 70 kg (154 lb 5.2 oz) (04/01 1418)  Intake/Output from previous day: 04/01 0701 - 04/02 0700 In: 1500 [I.V.:1500] Out: 3375 [Urine:3000; Drains:275; Blood:100] Intake/Output this shift: Total I/O In: 1140 [P.O.:240; I.V.:900] Out: -   Exam:  Neurologically intact Neurovascular intact Sensation intact distally Intact pulses distally Dorsiflexion/Plantar flexion intact  Labs:  Recent Labs  05/28/12 0630  HGB 11.2*    Recent Labs  05/28/12 0630  WBC 6.1  RBC 3.29*  HCT 32.0*  PLT 192    Recent Labs  05/28/12 0630  NA 140  K 3.0*  CL 100  CO2 31  BUN 10  CREATININE 0.63  GLUCOSE 137*  CALCIUM 8.2*    Recent Labs  05/28/12 0630  INR 1.09    Assessment/Plan: PT and CPM today - needs HHPT and HH nursing for Friday dc   Casee Knepp SCOTT 05/28/2012, 11:30 AM

## 2012-05-28 NOTE — Progress Notes (Signed)
ANTICOAGULATION CONSULT NOTE - Follow Up Consult  Pharmacy Consult for Coumadin Indication: VTE prophylaxis s/p R-TKA   Recent Labs  05/28/12 0630  HGB 11.2*  HCT 32.0*  PLT 192  LABPROT 14.0  INR 1.09  CREATININE 0.63   Lab Results  Component Value Date   INR 1.09 05/28/2012   INR 0.88 05/20/2012   Medications:  Scheduled:  . [COMPLETED]  ceFAZolin (ANCEF) IV  1 g Intravenous Q6H  . coumadin book  1 each Does not apply Once  . docusate sodium  100 mg Oral BID  . hydrochlorothiazide  25 mg Oral Daily  . irbesartan  300 mg Oral Daily  . morphine   Intravenous Q4H  . nisoldipine  17 mg Oral Daily  . [COMPLETED] warfarin  5 mg Oral ONCE-1800  . warfarin  1 each Does not apply Once  . Warfarin - Pharmacist Dosing Inpatient   Does not apply q1800  . [DISCONTINUED] cloNIDine  150 mcg Epidural To OR  . [DISCONTINUED] nisoldipine  20 mg Oral Daily  . [DISCONTINUED] telmisartan-hydrochlorothiazide  1 tablet Oral Daily   Assessment:  78 y/o female 1 Day Post-Op s/p R-TKA placed on Coumadin for VTE prophylaxis   INR 1.09.  No bleeding complications noted from anticoagulation  Goal of Therapy:  Target INR 2-3    Plan:  Repeat Coumadin 5 mg today.  Daily INR's, CBC.    Amy Carroll, Deetta Perla.D 05/28/2012, 1:04 PM

## 2012-05-28 NOTE — Care Management Note (Signed)
CARE MANAGEMENT NOTE 05/28/2012  Patient:  ROXY, MASTANDREA   Account Number:  [id number]  Date Initiated:  05/27/2012  Documentation initiated by:  [facility]  Subjective/Objective Assessment:   total right knee arthroplasty     Action/Plan:   SNF vs [facility]  05/28/12 Patient preoperativerly setup with [facility], no changes.patient has rolling walker, 3in1 and CPM.   Anticipated DC Date:  05/30/2012   Anticipated DC Plan:  HOME W HOME HEALTH SERVICES      DC Planning Services  CM consult      [facility] Choice  HOME HEALTH   Choice offered to / List presented to:  C-1 Patient        HH arranged  HH-1 RN  HH-2 PT      [facility] agency  [facility]   Status of service:  Completed, signed off Medicare Important Message given?   (If response is "NO", the following Medicare IM given date fields will be blank) Date Medicare IM given:   Date Additional Medicare IM given:    Discharge Disposition:  HOME W HOME HEALTH SERVICES  Per UR Regulation:    If discussed at Long Length of Stay Meetings, dates discussed:    Comments:

## 2012-05-28 NOTE — Evaluation (Signed)
Occupational Therapy Evaluation Patient Details Name: Amy Carroll MRN: [id number] DOB: Aug 29, 1934 Today's Date: 05/28/2012 Time: 4098-1191 OT Time Calculation (min): 34 min  OT Assessment / Plan / Recommendation Clinical Impression   This 78 y.o. Female admitted for Rt. TKA.  Pt. Appears to have memory deficits, and will need reinformcement and repetition to retain information.  Pt will benefit from OT for the below listed deficits,  to maximize safety and independence with BADLs to allow her to return home with spouse at supervision to min a level.      OT Assessment  Patient needs continued OT Services    Follow Up Recommendations  Home health OT;Supervision/Assistance - 24 hour    Barriers to Discharge None    Equipment Recommendations  None recommended by OT    Recommendations for Other Services    Frequency  Min 2X/week    Precautions / Restrictions Precautions Precautions: Knee Precaution Booklet Issued: No Required Braces or Orthoses: Knee Immobilizer - Right Knee Immobilizer - Right: On when out of bed or walking Restrictions Weight Bearing Restrictions: Yes RLE Weight Bearing: Weight bearing as tolerated       ADL  Eating/Feeding: Independent Where Assessed - Eating/Feeding: Bed level Grooming: Wash/dry hands;Minimal assistance Where Assessed - Grooming: Supported standing Upper Body Bathing: Minimal assistance Where Assessed - Upper Body Bathing: Supported sitting Lower Body Bathing: Maximal assistance Where Assessed - Lower Body Bathing: Supported sit to stand Upper Body Dressing: Minimal assistance Where Assessed - Upper Body Dressing: Unsupported sitting Lower Body Dressing: +1 Total assistance Where Assessed - Lower Body Dressing: Supported sit to Pharmacist, hospital: Minimal assistance Statistician Method: Sit to Barista: Raised toilet seat with arms (or 3-in-1 over toilet) Toileting - Clothing Manipulation and  Hygiene: Minimal assistance Where Assessed - Glass blower/designer Manipulation and Hygiene: Standing Transfers/Ambulation Related to ADLs: Min A.  Requires max cues for sequencing ADL Comments: Pt. requires cues/assist for safety    OT Diagnosis: Generalized weakness;Acute pain  OT Problem List: Decreased strength;Decreased activity tolerance;Decreased cognition;Decreased safety awareness;Decreased knowledge of use of DME or AE;Decreased knowledge of precautions;Pain OT Treatment Interventions: Self-care/ADL training;DME and/or AE instruction;Therapeutic activities;Patient/family education   OT Goals Acute Rehab OT Goals OT Goal Formulation: With patient Time For Goal Achievement: 06/04/12 Potential to Achieve Goals: Good ADL Goals Pt Will Perform Grooming: with supervision;Standing at sink ADL Goal: Grooming - Progress: Goal set today Pt Will Perform Lower Body Bathing: with supervision;Sit to stand from chair;Sit to stand from bed ADL Goal: Lower Body Bathing - Progress: Goal set today Pt Will Perform Lower Body Dressing: with supervision;Sit to stand from chair;Sit to stand from bed ADL Goal: Lower Body Dressing - Progress: Goal set today Pt Will Transfer to Toilet: with supervision;Ambulation;3-in-1 ADL Goal: Toilet Transfer - Progress: Goal set today Pt Will Perform Toileting - Clothing Manipulation: with supervision;Standing ADL Goal: Toileting - Clothing Manipulation - Progress: Goal set today Pt Will Perform Tub/Shower Transfer: with min assist;Shower seat without back;Ambulation;Tub transfer;Shower transfer;with DME ADL Goal: Tub/Shower Transfer - Progress: Goal set today  Visit Information  Last OT Received On: 05/28/12 Assistance Needed: +2    Subjective Data  Subjective: "That must be new.  I've never seen it before"  re: KI Patient Stated Goal: To get better   Prior Functioning     Home Living Lives With: Spouse Available Help at Discharge: Available 24  hours/day;Family Type of Home: House Home Access: Stairs to enter [employer] of Steps: 6 Entrance  Stairs-Rails: Right;Left;Can reach both Home Layout: Two level;Bed/bath upstairs;Full bath on main level Bathroom Shower/Tub: Tub/shower unit;Walk-in shower Bathroom Toilet: Standard Home Adaptive Equipment: Bedside commode/3-in-1;Walker - rolling Prior Function Level of Independence: Independent Able to Take Stairs?: Yes Driving: Yes Vocation: Retired Musician: No difficulties         Vision/Perception     Copywriter, advertising Overall Cognitive Status: Impaired Area of Impairment: Memory Arousal/Alertness: Awake/alert Orientation Level: Appears intact for tasks assessed Behavior During Session: [facility] for tasks performed Memory Deficits: Pt asking the same questions repetitively.  She asked to go to the BR, and as therapist was assiting her OOB, she asked, "what are we going to do?"  SHe was then reminded what we were going to do, and when she stood, she stated "what are we going to do?"   Every time IV alarmed, pt asked what the noise was with no recollection that she had asked and been told previously several times. (Pt with no recollection of using KI - see "s" statement)    Extremity/Trunk Assessment Right Upper Extremity Assessment RUE ROM/Strength/Tone: Within functional levels RUE Coordination: WFL - gross/fine motor Left Upper Extremity Assessment LUE ROM/Strength/Tone: Within functional levels LUE Coordination: WFL - gross/fine motor Trunk Assessment Trunk Assessment: Normal     Mobility Bed Mobility Bed Mobility: Supine to Sit;Sitting - Scoot to Edge of Bed Supine to Sit: 4: Min assist;HOB flat Sitting - Scoot to [place] of Bed: 4: Min assist Sit to Supine: 4: Min assist Details for Bed Mobility Assistance: VC for technique.  Assist R LE. Transfers Transfers: Sit to Stand;Stand to Sit Sit to Stand: 4: Min guard;From bed;From  toilet;With upper extremity assist Stand to Sit: 4: Min guard;To bed;To toilet Details for Transfer Assistance: Cues for hand placement        Balance     End of Session OT - End of Session Equipment Utilized During Treatment: Right knee immobilizer Activity Tolerance: Patient tolerated treatment well Patient left: in chair;with call bell/phone within reach;Other (comment) (on EOB with PT)  GO    Jeani Hawking, OTR/L [phone] Jeani Hawking M 05/28/2012, 3:07 PM

## 2012-05-28 NOTE — Progress Notes (Signed)
Physical Therapy Treatment Patient Details Name: Amy Carroll MRN: [id number] DOB: 14-Aug-1934 Today's Date: 05/28/2012 Time: 8413-2440 PT Time Calculation (min): 46 min  PT Assessment / Plan / Recommendation Comments on Treatment Session  Patient progressing well this morning with mobility and ambulation. Patient is anxious reguarding pain however motivated and progressing well. Will increase ambulation and therex this afternoon    Follow Up Recommendations  Home health PT     Does the patient have the potential to tolerate intense rehabilitation     Barriers to Discharge        Equipment Recommendations  None recommended by PT    Recommendations for Other Services    Frequency 7X/week   Plan Discharge plan remains appropriate;Frequency remains appropriate    Precautions / Restrictions Precautions Precautions: Fall Restrictions RLE Weight Bearing: Weight bearing as tolerated   Pertinent Vitals/Pain no apparent distress     Mobility  Bed Mobility Supine to Sit: 4: Min assist;HOB flat Sitting - Scoot to Edge of Bed: 4: Min assist Details for Bed Mobility Assistance: VCs for technique and use of rail,  assist for R LE. Transfers Sit to Stand: 4: Min guard;From bed;From toilet;With upper extremity assist Stand to Sit: 4: Min guard;With upper extremity assist;To toilet;To chair/3-in-1;With armrests Details for Transfer Assistance: Cues for safe hand placement and R LE positioning Ambulation/Gait Ambulation/Gait Assistance: 4: Min assist Ambulation Distance (Feet): 25 Feet Assistive device: Rolling walker Ambulation/Gait Assistance Details: Cues for gait sequence, WBAT and RW management. A for stability Gait Pattern: Step-to pattern;Decreased step length - right;Decreased step length - left;Antalgic Gait velocity: decreased    Exercises Total Joint Exercises Quad Sets: AROM;Right;10 reps Heel Slides: AAROM;Right;10 reps   PT Diagnosis:    PT Problem List:   PT  Treatment Interventions:     PT Goals Acute Rehab PT Goals PT Goal: Supine/Side to Sit - Progress: Progressing toward goal PT Transfer Goal: Bed to Chair/Chair to Bed - Progress: Progressing toward goal PT Goal: Ambulate - Progress: Progressing toward goal PT Goal: Perform Home Exercise Program - Progress: Progressing toward goal  Visit Information  Last PT Received On: 05/28/12 Assistance Needed: +1    Subjective Data      Cognition  Cognition Overall Cognitive Status: Appears within functional limits for tasks assessed/performed Arousal/Alertness: Awake/alert Orientation Level: Appears intact for tasks assessed Behavior During Session: [facility] for tasks performed    Balance     End of Session PT - End of Session Equipment Utilized During Treatment: Gait belt;Right knee immobilizer Activity Tolerance: Patient tolerated treatment well Patient left: in chair;with call bell/phone within reach Nurse Communication: Mobility status   GP     Fredrich Birks 05/28/2012, 11:06 AM 05/28/2012 Fredrich Birks PTA [phone] pager [phone] office

## 2012-05-28 NOTE — Progress Notes (Signed)
Physical Therapy Treatment Patient Details Name: Amy Carroll MRN: [id number] DOB: 1934-06-18 Today's Date: 05/28/2012 Time: 1330-1400 PT Time Calculation (min): 30 min  PT Assessment / Plan / Recommendation Comments on Treatment Session  Patient progressing with mobility, transfers, ambulation, and exercises.  Patient was limited in ability to recall this morning's treatment session.    Follow Up Recommendations  Home health PT     Does the patient have the potential to tolerate intense rehabilitation     Barriers to Discharge        Equipment Recommendations  None recommended by PT    Recommendations for Other Services    Frequency 7X/week   Plan Discharge plan remains appropriate;Frequency remains appropriate    Precautions / Restrictions Precautions Precautions: Knee Precaution Booklet Issued: No Required Braces or Orthoses: Knee Immobilizer - Right Knee Immobilizer - Right: On when out of bed or walking Restrictions Weight Bearing Restrictions: Yes RLE Weight Bearing: Weight bearing as tolerated   Pertinent Vitals/Pain     Mobility  Bed Mobility Bed Mobility: Sit to Supine Supine to Sit: 4: Min assist;HOB flat Sitting - Scoot to Edge of Bed: 4: Min assist Sit to Supine: 4: Min assist Details for Bed Mobility Assistance: VC for technique.  Assist R LE. Transfers Transfers: Sit to Stand;Stand to Sit Sit to Stand: 4: Min guard;From bed;From toilet;With upper extremity assist Stand to Sit: 4: Min guard Details for Transfer Assistance: Cues for safe hand placement and R LE postioning. Ambulation/Gait Ambulation/Gait Assistance: 4: Min guard Ambulation Distance (Feet): 60 Feet Assistive device: Rolling walker Ambulation/Gait Assistance Details: Cues for stance time on RLE.  Cues for  Gait Pattern: Decreased step length - right;Decreased step length - left;Step-to pattern Gait velocity: Decreased General Gait Details: VC for stance time on right and  posture. Stairs: No    Exercises Total Joint Exercises Quad Sets: AROM;Right;10 reps Short Arc QuadBarbaraann Boys;Right;10 reps Heel Slides: AROM;10 reps Straight Leg Raises: AAROM;Right;10 reps   PT Diagnosis:    PT Problem List:   PT Treatment Interventions:     PT Goals Acute Rehab PT Goals PT Goal: Supine/Side to Sit - Progress: Progressing toward goal PT Goal: Sit to Supine/Side - Progress: Progressing toward goal PT Transfer Goal: Bed to Chair/Chair to Bed - Progress: Progressing toward goal PT Goal: Ambulate - Progress: Progressing toward goal PT Goal: Perform Home Exercise Program - Progress: Progressing toward goal  Visit Information  Last PT Received On: 05/28/12 Assistance Needed: +2    Subjective Data      Cognition  Cognition Overall Cognitive Status: Impaired Area of Impairment: Memory Arousal/Alertness: Awake/alert Orientation Level: Appears intact for tasks assessed Behavior During Session: [name] for tasks performed Memory Deficits: Patient with limited recall of patient's earlier treatment session.      Balance     End of Session PT - End of Session Equipment Utilized During Treatment: Gait belt;Right knee immobilizer Activity Tolerance: Patient tolerated treatment well Patient left: in chair;with call bell/phone within reach Nurse Communication: Mobility status   GP     Catha Gosselin, SPTA 05/28/2012, 2:20 PM

## 2012-05-29 LAB — PROTIME-INR: Prothrombin Time: 20.4 seconds — ABNORMAL HIGH (ref 11.6–15.2)

## 2012-05-29 LAB — CBC
HCT: 30.4 % — ABNORMAL LOW (ref 36.0–46.0)
Hemoglobin: 10.4 g/dL — ABNORMAL LOW (ref 12.0–15.0)
MCH: 33.5 pg (ref 26.0–34.0)
MCHC: 34.2 g/dL (ref 30.0–36.0)

## 2012-05-29 MED ORDER — POTASSIUM CHLORIDE CRYS ER 20 MEQ PO TBCR
30.0000 meq | EXTENDED_RELEASE_TABLET | Freq: Two times a day (BID) | ORAL | Status: DC
  Administered 2012-05-29 (×2): 30 meq via ORAL
  Filled 2012-05-29 (×4): qty 1

## 2012-05-29 MED ORDER — WARFARIN SODIUM 2.5 MG PO TABS
2.5000 mg | ORAL_TABLET | Freq: Once | ORAL | Status: AC
  Administered 2012-05-29: 2.5 mg via ORAL
  Filled 2012-05-29: qty 1

## 2012-05-29 NOTE — Progress Notes (Signed)
ANTICOAGULATION CONSULT NOTE - Follow Up Consult  Pharmacy Consult for Coumadin Indication: VTE prophylaxis s/p R-TKA  Labs:  Recent Labs  05/28/12 0630 05/29/12 0450  HGB 11.2* 10.4*  HCT 32.0* 30.4*  PLT 192 187  LABPROT 14.0 20.4*  INR 1.09 1.82*  CREATININE 0.63  --    Lab Results  Component Value Date   INR 1.82* 05/29/2012   INR 1.09 05/28/2012   INR 0.88 05/20/2012   Medications:  Scheduled:  . coumadin book  1 each Does not apply Once  . docusate sodium  100 mg Oral BID  . hydrochlorothiazide  25 mg Oral Daily  . irbesartan  300 mg Oral Daily  . nisoldipine  17 mg Oral Daily  . potassium chloride  30 mEq Oral BID  . [COMPLETED] warfarin  5 mg Oral ONCE-1800  . warfarin  1 each Does not apply Once  . Warfarin - Pharmacist Dosing Inpatient   Does not apply q1800  . [DISCONTINUED] morphine   Intravenous Q4H    Assessment:  78 y/o female 2 Days Post-Op R-TKA on Coumadin for VTE prophylaxis   Large increase in INR after 2 doses,  1.09 > 1.82.  No identified drug interactions.  No bleeding complications noted   Goal of Therapy:  Target INR 2-3    Plan:  Reduce Coumadin dose to 2.5 mg today.  Daily INR's, CBC.   Yafet Cline, Deetta Perla.D 05/29/2012, 1:26 PM

## 2012-05-29 NOTE — Progress Notes (Signed)
Seen and agreed 05/29/2012 Candiss Galeana Elizabeth PTA [phone] pager [phone] office    

## 2012-05-29 NOTE — Progress Notes (Signed)
Physical Therapy Treatment Patient Details Name: Amy Carroll MRN: [id number] DOB: [birth year] Today's Date: 05/29/2012 Time: 4098-1191 PT Time Calculation (min): 33 min  PT Assessment / Plan / Recommendation Comments on Treatment Session  Patient progressing well with mobility, transfers, ambulation, and exercises. Will attempt stair this afternoon.    Follow Up Recommendations  Home health PT     Does the patient have the potential to tolerate intense rehabilitation     Barriers to Discharge        Equipment Recommendations  None recommended by PT    Recommendations for Other Services    Frequency 7X/week   Plan Discharge plan remains appropriate;Frequency remains appropriate    Precautions / Restrictions Precautions Precautions: Knee Required Braces or Orthoses: Knee Immobilizer - Right Knee Immobilizer - Right: On when out of bed or walking Restrictions Weight Bearing Restrictions: Yes RLE Weight Bearing: Weight bearing as tolerated   Pertinent Vitals/Pain no apparent distress     Mobility  Bed Mobility Bed Mobility: Supine to Sit;Sitting - Scoot to Edge of Bed Supine to Sit: 4: Min assist Sitting - Scoot to [place] of Bed: 5: Supervision Transfers Transfers: Sit to Stand;Stand to Sit Sit to Stand: 4: Min guard Stand to Sit: 4: Min guard Details for Transfer Assistance: Cues for hand placement. Ambulation/Gait Ambulation/Gait Assistance: 4: Min guard Ambulation Distance (Feet): 100 Feet Assistive device: Rolling walker Ambulation/Gait Assistance Details: Cues for equal stance time RLE, Cues for posture. Gait Pattern: Step-through pattern;Decreased step length - left;Decreased step length - right Gait velocity: Decreased General Gait Details: VC for stance time and posture. Stairs: No    Exercises Total Joint Exercises Quad Sets: AROM;Right;10 reps Short Arc QuadBarbaraann Boys;Right;10 reps Heel Slides: AROM;Right;10 reps Straight Leg Raises: AAROM;Right;10  reps   PT Diagnosis:    PT Problem List:   PT Treatment Interventions:     PT Goals Acute Rehab PT Goals PT Goal: Supine/Side to Sit - Progress: Progressing toward goal PT Goal: Ambulate - Progress: Progressing toward goal  Visit Information  Last PT Received On: 05/29/12 Assistance Needed: +1    Subjective Data      Cognition  Cognition Overall Cognitive Status: Appears within functional limits for tasks assessed/performed Arousal/Alertness: Awake/alert Orientation Level: Appears intact for tasks assessed Behavior During Session: [facility] for tasks performed    Balance     End of Session PT - End of Session Equipment Utilized During Treatment: Gait belt;Right knee immobilizer Activity Tolerance: Patient tolerated treatment well Patient left: in bed;with call bell/phone within reach Nurse Communication: Other (comment) CPM Right Knee CPM Right Knee: Off Right Knee Flexion (Degrees): 50 Right Knee Extension (Degrees): 0   GP     Kerryn Tennant JEAN SPTA 05/29/2012, 8:43 AM

## 2012-05-29 NOTE — Progress Notes (Addendum)
Occupational Therapy Treatment Patient Details Name: Amy Carroll MRN: [id number] DOB: October 03, 1934 Today's Date: 05/29/2012 Time: 4098-1191 OT Time Calculation (min): 39 min  OT Assessment / Plan / Recommendation Comments on Treatment Session Pt with improvement in ADLs today. Cues for safety during session as pt was keeping walker too far away when walking. Pt self-distracted and needed redirection during session.     Follow Up Recommendations  Home health OT;Supervision/Assistance - 24 hour    Barriers to Discharge       Equipment Recommendations  None recommended by OT    Recommendations for Other Services    Frequency Min 2X/week   Plan Discharge plan remains appropriate    Precautions / Restrictions Precautions Precautions: Knee Required Braces or Orthoses: Knee Immobilizer - Right Knee Immobilizer - Right: On when out of bed or walking Restrictions Weight Bearing Restrictions: Yes RLE Weight Bearing: Weight bearing as tolerated   Pertinent Vitals/Pain 3/10 pain in Rt knee. Pt requested pain meds, nursing notified    ADL  Grooming: Wash/dry hands;Wash/dry face;Teeth care;Supervision/safety Where Assessed - Grooming: Supported standing Lower Body Bathing: Minimal assistance Where Assessed - Lower Body Bathing: Unsupported sitting Lower Body Dressing: Minimal assistance Where Assessed - Lower Body Dressing: Unsupported sitting Toilet Transfer: Min guard Toilet Transfer Method: Sit to Barista: Raised toilet seat with arms (or 3-in-1 over toilet) Toileting - Clothing Manipulation and Hygiene: Min guard Where Assessed - Glass blower/designer Manipulation and Hygiene: Standing Equipment Used: Gait belt;Rolling walker;Knee Immobilizer Transfers/Ambulation Related to ADLs: Min G with verbal cues to keep walker closer to her ADL Comments: Min A for lower body ADLs without use of AE.      OT Goals Acute Rehab OT Goals OT Goal Formulation: With  patient Time For Goal Achievement: 06/04/12 Potential to Achieve Goals: Good ADL Goals Pt Will Perform Grooming: with supervision;Standing at sink ADL Goal: Grooming - Progress: Met Pt Will Perform Lower Body Bathing: with supervision;Sit to stand from chair;Sit to stand from bed ADL Goal: Lower Body Bathing - Progress: Progressing toward goals Pt Will Perform Lower Body Dressing: with supervision;Sit to stand from chair;Sit to stand from bed ADL Goal: Lower Body Dressing - Progress: Progressing toward goals Pt Will Transfer to Toilet: with supervision;Ambulation;3-in-1 ADL Goal: Toilet Transfer - Progress: Progressing toward goals Pt Will Perform Toileting - Clothing Manipulation: with supervision;Standing ADL Goal: Toileting - Clothing Manipulation - Progress: Progressing toward goals Pt Will Perform Tub/Shower Transfer: with min assist;Shower seat without back;Ambulation;Tub transfer;Shower transfer;with DME  Visit Information  Last OT Received On: 05/29/12 Assistance Needed: +1    Subjective Data    "Why are we putting this on?" (referring to knee immobilizer)   Prior Functioning       Cognition  Cognition Overall Cognitive Status: Impaired Area of Impairment: Memory and Attention Arousal/Alertness: Awake/alert  Orientation Level: Appears intact for tasks assessed  Behavior During Session: [facility] for tasks performed  Memory Deficits: Pt asking multiple times about knee immobilizer and purpose. Also asked nurse repetively about medication that the nurse had just explained.  Attention Deficits:Pt taking excessive amount of time during session due to inattention and also asking repetivie questions due to decreased memory. Arousal/Alertness: Awake/alert Orientation Level: Appears intact for tasks assessed Behavior During Session: [facility] for tasks performed    Mobility  Bed Mobility Bed Mobility: Supine to Sit;Sitting - Scoot to Edge of Bed;Sit to Supine Supine to Sit: 4: Min  assist (assist to move right leg) Sitting - Scoot to [place]  of Bed: 5: Supervision Sit to Supine: 5: Supervision Transfers Transfers: Sit to Stand;Stand to Sit Sit to Stand: 4: Min guard;With upper extremity assist;From bed;From chair/3-in-1 Stand to Sit: 4: Min guard;To bed;To chair/3-in-1 Details for Transfer Assistance: cues for hand placement             End of Session OT - End of Session Equipment Utilized During Treatment: Gait belt;Right knee immobilizer Activity Tolerance: Patient tolerated treatment well Patient left: in bed;with call bell/phone within reach Nurse Communication: Patient requests pain meds  GO     Earlie Raveling OTR/L [phone] 05/29/2012, 10:47 AM

## 2012-05-29 NOTE — Progress Notes (Signed)
Physical Therapy Treatment Patient Details Name: Amy Carroll MRN: [id number] DOB: Jul 22, 1934 Today's Date: 05/29/2012 Time: 1341-1416 PT Time Calculation (min): 35 min  PT Assessment / Plan / Recommendation Comments on Treatment Session  Patient able to tolerate stair training well. Continue to ambulation and stair training in AM and anticipate DC in AM    Follow Up Recommendations  Home health PT     Does the patient have the potential to tolerate intense rehabilitation     Barriers to Discharge        Equipment Recommendations  None recommended by PT    Recommendations for Other Services    Frequency 7X/week   Plan Discharge plan remains appropriate;Frequency remains appropriate    Precautions / Restrictions Precautions Precautions: Knee Required Braces or Orthoses: Knee Immobilizer - Right Knee Immobilizer - Right: On when out of bed or walking Restrictions Weight Bearing Restrictions: Yes RLE Weight Bearing: Weight bearing as tolerated   Pertinent Vitals/Pain     Mobility  Bed Mobility Bed Mobility: Supine to Sit;Sitting - Scoot to Edge of Bed;Sit to Supine Supine to Sit: 4: Min assist (assist to move right leg) Sitting - Scoot to Edge of Bed: 5: Supervision Sit to Supine: 5: Supervision Transfers Sit to Stand: 4: Min guard;With upper extremity assist;From chair/3-in-1 Stand to Sit: 4: Min guard;To chair/3-in-1 Details for Transfer Assistance: cues for hand placement  Ambulation/Gait Ambulation/Gait Assistance: 4: Min guard Ambulation Distance (Feet): 200 Feet Assistive device: Rolling walker Ambulation/Gait Assistance Details: Cues for posture and to increase weight through LEs Gait Pattern: Step-through pattern;Decreased step length - left;Decreased step length - right General Gait Details: VC for stance time and posture. Stairs: Yes Stairs Assistance: 4: Min assist Stairs Assistance Details (indicate cue type and reason): A for balance. Cues for  technique Stair Management Technique: Two rails;Forwards Number of Stairs: 3    Exercises Total Joint Exercises Quad Sets: AROM;Right;15 reps Short Arc QuadBarbaraann Boys;Right;15 reps Heel Slides: AROM;Right;15 reps Straight Leg Raises: AAROM;Right;15 reps   PT Diagnosis:    PT Problem List:   PT Treatment Interventions:     PT Goals Acute Rehab PT Goals PT Transfer Goal: Bed to Chair/Chair to Bed - Progress: Progressing toward goal PT Goal: Ambulate - Progress: Progressing toward goal PT Goal: Up/Down Stairs - Progress: Progressing toward goal PT Goal: Perform Home Exercise Program - Progress: Progressing toward goal  Visit Information  Last PT Received On: 05/29/12 Assistance Needed: +1    Subjective Data      Cognition  Cognition Overall Cognitive Status: Appears within functional limits for tasks assessed/performed Area of Impairment: Attention;Memory Arousal/Alertness: Awake/alert Orientation Level: Appears intact for tasks assessed Behavior During Session: [facility] for tasks performed Attention - Other Comments: Pt taking excessive amount of time during session due to inattention and also asking repetivie questions due to decreased memory.  Memory Deficits: Pt asking multiple times about knee immobilizer and purpose. Also asked nurse repetively about medication that the nurse had just explained.     Balance     End of Session PT - End of Session Equipment Utilized During Treatment: Gait belt;Right knee immobilizer Activity Tolerance: Patient tolerated treatment well Patient left: with call bell/phone within reach;in chair   GP     Robinette, Adline Potter 05/29/2012, 2:24 PM 05/29/2012 Fredrich Birks PTA [phone] pager [phone] office

## 2012-05-29 NOTE — Progress Notes (Signed)
Subjective: Pt stable - pain controlled - progressing on cpm   Objective: Vital signs in last 24 hours: Temp:  [98.2 F (36.8 C)-99.6 F (37.6 C)] 99.3 F (37.4 C) (04/03 0535) Pulse Rate:  [69-103] 69 (04/03 0535) Resp:  [16-20] 18 (04/03 0535) BP: (116-134)/(49-62) 117/57 mmHg (04/03 0535) SpO2:  [93 %-94 %] 94 % (04/03 0535)  Intake/Output from previous day: 04/02 0701 - 04/03 0700 In: 1620 [P.O.:720; I.V.:900] Out: -  Intake/Output this shift:    Exam:  Neurovascular intact Sensation intact distally Intact pulses distally Dorsiflexion/Plantar flexion intact  Labs:  Recent Labs  05/28/12 0630 05/29/12 0450  HGB 11.2* 10.4*    Recent Labs  05/28/12 0630 05/29/12 0450  WBC 6.1 7.7  RBC 3.29* 3.10*  HCT 32.0* 30.4*  PLT 192 187    Recent Labs  05/28/12 0630  NA 140  K 3.0*  CL 100  CO2 31  BUN 10  CREATININE 0.63  GLUCOSE 137*  CALCIUM 8.2*    Recent Labs  05/28/12 0630 05/29/12 0450  INR 1.09 1.82*    Assessment/Plan: Pt stable - more CPM and PT - change dressing am - possible dc am - give K   Mary Hockey SCOTT 05/29/2012, 8:25 AM

## 2012-05-30 LAB — CBC
HCT: 27.8 % — ABNORMAL LOW (ref 36.0–46.0)
Hemoglobin: 9.8 g/dL — ABNORMAL LOW (ref 12.0–15.0)
MCH: 34.6 pg — ABNORMAL HIGH (ref 26.0–34.0)
MCHC: 35.3 g/dL (ref 30.0–36.0)

## 2012-05-30 MED ORDER — OXYCODONE HCL 5 MG PO TABS: 5.0000 mg | ORAL_TABLET | ORAL | Status: DC | PRN

## 2012-05-30 MED ORDER — METHOCARBAMOL 500 MG PO TABS: 500.0000 mg | ORAL_TABLET | Freq: Four times a day (QID) | ORAL | Status: DC | PRN

## 2012-05-30 MED ORDER — WARFARIN SODIUM 5 MG PO TABS: 5.0000 mg | ORAL_TABLET | Freq: Every day | ORAL | Status: DC

## 2012-05-30 MED ORDER — WARFARIN SODIUM 2.5 MG PO TABS
2.5000 mg | ORAL_TABLET | Freq: Once | ORAL | Status: DC
  Filled 2012-05-30: qty 1

## 2012-05-30 NOTE — Progress Notes (Signed)
ANTICOAGULATION CONSULT NOTE - Follow Up Consult  Pharmacy Consult for Coumadin Indication: VTE prophylaxis s/p R-TKA  Labs:  Recent Labs  05/28/12 0630 05/29/12 0450 05/30/12 0430  HGB 11.2* 10.4* 9.8*  HCT 32.0* 30.4* 27.8*  PLT 192 187 176  LABPROT 14.0 20.4* 24.0*  INR 1.09 1.82* 2.26*  CREATININE 0.63  --   --    Lab Results  Component Value Date   INR 2.26* 05/30/2012   INR 1.82* 05/29/2012   INR 1.09 05/28/2012   Medications:  Scheduled:  . coumadin book  1 each Does not apply Once  . docusate sodium  100 mg Oral BID  . hydrochlorothiazide  25 mg Oral Daily  . irbesartan  300 mg Oral Daily  . nisoldipine  17 mg Oral Daily  . potassium chloride  30 mEq Oral BID  . [COMPLETED] warfarin  2.5 mg Oral ONCE-1800  . warfarin  1 each Does not apply Once  . Warfarin - Pharmacist Dosing Inpatient   Does not apply q1800    Assessment:  78 y/o female 4 days post-op for R-TKA on Coumadin for VTE prophylaxis   Started on Warfarin therapy 4/1 with 5 mg and had a large increase in INR after 2 doses,  1.09 > 1.82.  Dose reduced last evening and INR today is 2.26 and is within desired goal range.    No identified drug interactions.    No bleeding complications noted but she has had a slight drop in her hemoglobin 11.2>>10.4>>9.8.  Goal of Therapy:  Target INR 2-3    Plan:  Give Coumadin dose of 2.5 mg today.  Daily INR's, CBC.   Nadara Mustard, PharmD., MS Clinical Pharmacist Pager:  [phone] Thank you for allowing pharmacy to be part of this patients care team. 05/30/2012, 1:10 PM

## 2012-05-30 NOTE — Progress Notes (Signed)
PHYSICAL THERAPY NOTE  05/30/12 1100  PT Visit Information  Last PT Received On 05/30/12  PT Time Calculation  PT Start Time 1116  PT Stop Time 1146  PT Time Calculation (min) 30 min  Precautions  Precautions Knee  Restrictions  Weight Bearing Restrictions Yes  RLE Weight Bearing WBAT  Cognition  Overall Cognitive Status Appears within functional limits for tasks assessed/performed  Area of Impairment Attention;Memory  Behavior During Session [facility] for tasks performed  Bed Mobility  Bed Mobility Supine to Sit;Sit to Supine  Supine to Sit 6: Modified independent (Device/Increase time)  Sitting - Scoot to Edge of Bed 6: Modified independent (Device/Increase time)  Sit to Supine 6: Modified independent (Device/Increase time)  Details for Bed Mobility Assistance Pt able to manage R LE indpendently  Transfers  Transfers Sit to Stand;Stand to Sit  Sit to Stand 6: Modified independent (Device/Increase time)  Stand to Sit 6: Modified independent (Device/Increase time)  Stand Pivot Transfers 6: Modified independent (Device/Increase time)  Ambulation/Gait  Ambulation/Gait Assistance 5: Supervision  Ambulation Distance (Feet) 150 Feet  Assistive device Rolling walker  Ambulation/Gait Assistance Details cues for gait sequencing.   Gait Pattern Step-through pattern;Decreased step length - left;Decreased step length - right  Gait velocity Decreased  Stairs Yes  Stairs Assistance 5: Supervision;6: Modified independent (Device/Increase time)  Stair Management Technique Two rails;Forwards  Number of Stairs 6  Wheelchair Mobility  Wheelchair Mobility No  PT - End of Session  Equipment Utilized During Treatment Gait belt;Right knee immobilizer  Activity Tolerance Patient tolerated treatment well  Patient left with call bell/phone within reach;in chair  Nurse Communication Mobility status  PT - Assessment/Plan  Comments on Treatment Session Pt doing well with mobility and should be able to  d/c home today.   PT Plan Discharge plan remains appropriate;Frequency remains appropriate  PT Frequency 7X/week  Recommendations for Other Services OT consult  Follow Up Recommendations Home health PT  PT equipment None recommended by PT  Acute Rehab PT Goals  PT Goal Formulation With patient  Time For Goal Achievement 06/10/12  Potential to Achieve Goals Good  Pt will go Supine/Side to Sit with modified independence  PT Goal: Supine/Side to Sit - Progress Met  Pt will go Sit to Supine/Side with modified independence  PT Goal: Sit to Supine/Side - Progress Met  Pt will Transfer Bed to Chair/Chair to Bed with modified independence  PT Transfer Goal: Bed to Chair/Chair to Bed - Progress Met  Pt will Ambulate 51 - 150 feet;with supervision;with rolling walker  PT Goal: Ambulate - Progress Met  Pt will Go Up / Down Stairs 6-9 stairs;with supervision;with least restrictive assistive device  PT Goal: Up/Down Stairs - Progress Met  Pt will Perform Home Exercise Program with supervision, verbal cues required/provided  PT Goal: Perform Home Exercise Program - Progress Progressing toward goal  PT General Charges  $$ ACUTE PT VISIT 1 Procedure  PT Treatments  $Gait Training 8-22 mins  $Therapeutic Activity 8-22 mins  Khamya Topp L. Chan Sheahan DPT [phone]

## 2012-05-30 NOTE — Progress Notes (Signed)
Subjective: Pt stable - doing qwell - needs one more PT this am   Objective: Vital signs in last 24 hours: Temp:  [98.2 F (36.8 C)-98.7 F (37.1 C)] 98.7 F (37.1 C) (04/04 0557) Pulse Rate:  [60-102] 60 (04/04 0557) Resp:  [16-18] 16 (04/04 0557) BP: (101-119)/(16-67) 110/64 mmHg (04/04 0557) SpO2:  [93 %-98 %] 96 % (04/04 0557)  Intake/Output from previous day: 04/03 0701 - 04/04 0700 In: 480 [P.O.:480] Out: -  Intake/Output this shift:    Exam:  Neurovascular intact Sensation intact distally Intact pulses distally Dorsiflexion/Plantar flexion intact  Labs:  Recent Labs  05/28/12 0630 05/29/12 0450 05/30/12 0430  HGB 11.2* 10.4* 9.8*    Recent Labs  05/29/12 0450 05/30/12 0430  WBC 7.7 7.0  RBC 3.10* 2.83*  HCT 30.4* 27.8*  PLT 187 176    Recent Labs  05/28/12 0630  NA 140  K 3.0*  CL 100  CO2 31  BUN 10  CREATININE 0.63  GLUCOSE 137*  CALCIUM 8.2*    Recent Labs  05/29/12 0450 05/30/12 0430  INR 1.82* 2.26*    Assessment/Plan: Pt doing well dc home today   DEAN,GREGORY SCOTT 05/30/2012, 8:32 AM

## 2012-05-30 NOTE — Progress Notes (Signed)
Physical Therapy Treatment Patient Details Name: RAISHA BRABENDER MRN: [id number] DOB: 1934/03/30 Today's Date: 05/30/2012 Time: 3474-2595 PT Time Calculation (min): 14 min  PT Assessment / Plan / Recommendation Comments on Treatment Session  All acute PT goals met.     Follow Up Recommendations  Home health PT     Does the patient have the potential to tolerate intense rehabilitation     Barriers to Discharge        Equipment Recommendations  None recommended by PT    Recommendations for Other Services OT consult  Frequency 7X/week   Plan All goals met and education completed, patient dischaged from PT services    Precautions / Restrictions Precautions Precautions: Knee Precaution Booklet Issued: No Precaution Comments: educated pt on position of R knee to promote knee extension and provided pt with HEP handout.    Required Braces or Orthoses: Knee Immobilizer - Right Knee Immobilizer - Right: On when out of bed or walking Restrictions Weight Bearing Restrictions: Yes RLE Weight Bearing: Weight bearing as tolerated   Pertinent Vitals/Pain 2/10 pain in knee.      Mobility  Bed Mobility Bed Mobility: Not assessed Transfers Transfers: Not assessed Ambulation/Gait Ambulation/Gait Assistance: Not tested (comment)    Exercises Total Joint Exercises Ankle Circles/Pumps: Left;10 reps Quad Sets: AROM;Right;15 reps Short Arc QuadBarbaraann Boys;Right;15 reps Heel Slides: AROM;Right;15 reps Straight Leg Raises: AAROM;Right;15 reps Goniometric ROM: 0-92 degrees AAROM in knee.    PT Diagnosis:    PT Problem List:   PT Treatment Interventions:     PT Goals Acute Rehab PT Goals PT Goal Formulation: With patient Time For Goal Achievement: 06/10/12 Potential to Achieve Goals: Good Pt will Perform Home Exercise Program: with supervision, verbal cues required/provided PT Goal: Perform Home Exercise Program - Progress: Met  Visit Information  Last PT Received On: 05/30/12     Subjective Data      Cognition  Cognition Overall Cognitive Status: Appears within functional limits for tasks assessed/performed Area of Impairment: Attention;Memory Arousal/Alertness: Awake/alert Behavior During Session: WFL for tasks performed    Balance     End of Session PT - End of Session Equipment Utilized During Treatment: Gait belt;Right knee immobilizer Activity Tolerance: Patient tolerated treatment well Patient left: with call bell/phone within reach;in chair Nurse Communication: Mobility status   GP     Remonia Otte 05/30/2012, 5:19 PM Dalaney Needle L. Keegan Bensch DPT [phone]

## 2012-05-31 DIAGNOSIS — Z5181 Encounter for therapeutic drug level monitoring: Secondary | ICD-10-CM | POA: Diagnosis not present

## 2012-05-31 DIAGNOSIS — Z96659 Presence of unspecified artificial knee joint: Secondary | ICD-10-CM | POA: Diagnosis not present

## 2012-05-31 DIAGNOSIS — Z4801 Encounter for change or removal of surgical wound dressing: Secondary | ICD-10-CM | POA: Diagnosis not present

## 2012-05-31 DIAGNOSIS — Z471 Aftercare following joint replacement surgery: Secondary | ICD-10-CM | POA: Diagnosis not present

## 2012-05-31 DIAGNOSIS — I10 Essential (primary) hypertension: Secondary | ICD-10-CM | POA: Diagnosis not present

## 2012-05-31 DIAGNOSIS — M1710 Unilateral primary osteoarthritis, unspecified knee: Secondary | ICD-10-CM | POA: Diagnosis not present

## 2012-06-02 DIAGNOSIS — Z96659 Presence of unspecified artificial knee joint: Secondary | ICD-10-CM | POA: Diagnosis not present

## 2012-06-02 DIAGNOSIS — Z471 Aftercare following joint replacement surgery: Secondary | ICD-10-CM | POA: Diagnosis not present

## 2012-06-02 DIAGNOSIS — I10 Essential (primary) hypertension: Secondary | ICD-10-CM | POA: Diagnosis not present

## 2012-06-02 DIAGNOSIS — Z5181 Encounter for therapeutic drug level monitoring: Secondary | ICD-10-CM | POA: Diagnosis not present

## 2012-06-02 DIAGNOSIS — Z4801 Encounter for change or removal of surgical wound dressing: Secondary | ICD-10-CM | POA: Diagnosis not present

## 2012-06-03 DIAGNOSIS — Z96659 Presence of unspecified artificial knee joint: Secondary | ICD-10-CM | POA: Diagnosis not present

## 2012-06-03 DIAGNOSIS — Z5181 Encounter for therapeutic drug level monitoring: Secondary | ICD-10-CM | POA: Diagnosis not present

## 2012-06-03 DIAGNOSIS — I10 Essential (primary) hypertension: Secondary | ICD-10-CM | POA: Diagnosis not present

## 2012-06-03 DIAGNOSIS — Z4801 Encounter for change or removal of surgical wound dressing: Secondary | ICD-10-CM | POA: Diagnosis not present

## 2012-06-03 DIAGNOSIS — Z471 Aftercare following joint replacement surgery: Secondary | ICD-10-CM | POA: Diagnosis not present

## 2012-06-04 DIAGNOSIS — Z96659 Presence of unspecified artificial knee joint: Secondary | ICD-10-CM | POA: Diagnosis not present

## 2012-06-04 DIAGNOSIS — Z471 Aftercare following joint replacement surgery: Secondary | ICD-10-CM | POA: Diagnosis not present

## 2012-06-04 DIAGNOSIS — I10 Essential (primary) hypertension: Secondary | ICD-10-CM | POA: Diagnosis not present

## 2012-06-04 DIAGNOSIS — Z4801 Encounter for change or removal of surgical wound dressing: Secondary | ICD-10-CM | POA: Diagnosis not present

## 2012-06-04 DIAGNOSIS — Z5181 Encounter for therapeutic drug level monitoring: Secondary | ICD-10-CM | POA: Diagnosis not present

## 2012-06-05 DIAGNOSIS — Z471 Aftercare following joint replacement surgery: Secondary | ICD-10-CM | POA: Diagnosis not present

## 2012-06-05 DIAGNOSIS — I10 Essential (primary) hypertension: Secondary | ICD-10-CM | POA: Diagnosis not present

## 2012-06-05 DIAGNOSIS — Z4801 Encounter for change or removal of surgical wound dressing: Secondary | ICD-10-CM | POA: Diagnosis not present

## 2012-06-05 DIAGNOSIS — Z96659 Presence of unspecified artificial knee joint: Secondary | ICD-10-CM | POA: Diagnosis not present

## 2012-06-05 DIAGNOSIS — Z5181 Encounter for therapeutic drug level monitoring: Secondary | ICD-10-CM | POA: Diagnosis not present

## 2012-06-06 DIAGNOSIS — Z96659 Presence of unspecified artificial knee joint: Secondary | ICD-10-CM | POA: Diagnosis not present

## 2012-06-06 DIAGNOSIS — Z4801 Encounter for change or removal of surgical wound dressing: Secondary | ICD-10-CM | POA: Diagnosis not present

## 2012-06-06 DIAGNOSIS — I10 Essential (primary) hypertension: Secondary | ICD-10-CM | POA: Diagnosis not present

## 2012-06-06 DIAGNOSIS — Z5181 Encounter for therapeutic drug level monitoring: Secondary | ICD-10-CM | POA: Diagnosis not present

## 2012-06-06 DIAGNOSIS — Z471 Aftercare following joint replacement surgery: Secondary | ICD-10-CM | POA: Diagnosis not present

## 2012-06-09 DIAGNOSIS — Z5181 Encounter for therapeutic drug level monitoring: Secondary | ICD-10-CM | POA: Diagnosis not present

## 2012-06-09 DIAGNOSIS — Z471 Aftercare following joint replacement surgery: Secondary | ICD-10-CM | POA: Diagnosis not present

## 2012-06-09 DIAGNOSIS — Z96659 Presence of unspecified artificial knee joint: Secondary | ICD-10-CM | POA: Diagnosis not present

## 2012-06-09 DIAGNOSIS — Z4801 Encounter for change or removal of surgical wound dressing: Secondary | ICD-10-CM | POA: Diagnosis not present

## 2012-06-09 DIAGNOSIS — I10 Essential (primary) hypertension: Secondary | ICD-10-CM | POA: Diagnosis not present

## 2012-06-09 DIAGNOSIS — M1710 Unilateral primary osteoarthritis, unspecified knee: Secondary | ICD-10-CM | POA: Diagnosis not present

## 2012-06-10 DIAGNOSIS — Z5181 Encounter for therapeutic drug level monitoring: Secondary | ICD-10-CM | POA: Diagnosis not present

## 2012-06-10 DIAGNOSIS — Z4801 Encounter for change or removal of surgical wound dressing: Secondary | ICD-10-CM | POA: Diagnosis not present

## 2012-06-10 DIAGNOSIS — I10 Essential (primary) hypertension: Secondary | ICD-10-CM | POA: Diagnosis not present

## 2012-06-10 DIAGNOSIS — Z96659 Presence of unspecified artificial knee joint: Secondary | ICD-10-CM | POA: Diagnosis not present

## 2012-06-10 DIAGNOSIS — Z471 Aftercare following joint replacement surgery: Secondary | ICD-10-CM | POA: Diagnosis not present

## 2012-06-11 DIAGNOSIS — Z471 Aftercare following joint replacement surgery: Secondary | ICD-10-CM | POA: Diagnosis not present

## 2012-06-11 DIAGNOSIS — I10 Essential (primary) hypertension: Secondary | ICD-10-CM | POA: Diagnosis not present

## 2012-06-11 DIAGNOSIS — Z96659 Presence of unspecified artificial knee joint: Secondary | ICD-10-CM | POA: Diagnosis not present

## 2012-06-11 DIAGNOSIS — Z5181 Encounter for therapeutic drug level monitoring: Secondary | ICD-10-CM | POA: Diagnosis not present

## 2012-06-11 DIAGNOSIS — Z4801 Encounter for change or removal of surgical wound dressing: Secondary | ICD-10-CM | POA: Diagnosis not present

## 2012-06-12 DIAGNOSIS — I10 Essential (primary) hypertension: Secondary | ICD-10-CM | POA: Diagnosis not present

## 2012-06-12 DIAGNOSIS — Z5181 Encounter for therapeutic drug level monitoring: Secondary | ICD-10-CM | POA: Diagnosis not present

## 2012-06-12 DIAGNOSIS — Z471 Aftercare following joint replacement surgery: Secondary | ICD-10-CM | POA: Diagnosis not present

## 2012-06-12 DIAGNOSIS — Z96659 Presence of unspecified artificial knee joint: Secondary | ICD-10-CM | POA: Diagnosis not present

## 2012-06-12 DIAGNOSIS — Z4801 Encounter for change or removal of surgical wound dressing: Secondary | ICD-10-CM | POA: Diagnosis not present

## 2012-06-13 DIAGNOSIS — Z4801 Encounter for change or removal of surgical wound dressing: Secondary | ICD-10-CM | POA: Diagnosis not present

## 2012-06-13 DIAGNOSIS — I10 Essential (primary) hypertension: Secondary | ICD-10-CM | POA: Diagnosis not present

## 2012-06-13 DIAGNOSIS — Z96659 Presence of unspecified artificial knee joint: Secondary | ICD-10-CM | POA: Diagnosis not present

## 2012-06-13 DIAGNOSIS — Z471 Aftercare following joint replacement surgery: Secondary | ICD-10-CM | POA: Diagnosis not present

## 2012-06-13 DIAGNOSIS — Z5181 Encounter for therapeutic drug level monitoring: Secondary | ICD-10-CM | POA: Diagnosis not present

## 2012-06-16 DIAGNOSIS — Z5181 Encounter for therapeutic drug level monitoring: Secondary | ICD-10-CM | POA: Diagnosis not present

## 2012-06-16 DIAGNOSIS — I10 Essential (primary) hypertension: Secondary | ICD-10-CM | POA: Diagnosis not present

## 2012-06-16 DIAGNOSIS — Z4801 Encounter for change or removal of surgical wound dressing: Secondary | ICD-10-CM | POA: Diagnosis not present

## 2012-06-16 DIAGNOSIS — Z96659 Presence of unspecified artificial knee joint: Secondary | ICD-10-CM | POA: Diagnosis not present

## 2012-06-16 DIAGNOSIS — Z471 Aftercare following joint replacement surgery: Secondary | ICD-10-CM | POA: Diagnosis not present

## 2012-06-16 NOTE — Discharge Summary (Signed)
Physician Discharge Summary  Patient ID: Amy Carroll MRN: [id number] DOB/AGE: [birth year] 78 y.o.  Admit date: 05/27/2012 Discharge date: 05/30/2012 Admission Diagnoses:  Principal Problem:   Osteoarthritis of right knee   Discharge Diagnoses:  Same  Surgeries: Procedure(s): TOTAL KNEE ARTHROPLASTY on 05/27/2012   Consultants:    Discharged Condition: Stable  Hospital Course: Amy Carroll is an 78 y.o. female who was admitted 05/27/2012 with a chief complaint of right knee pain, and found to have a diagnosis of Osteoarthritis of right knee.  They were brought to the operating room on 05/27/2012 and underwent the above named procedures.  She tolerated the procedure well - was walking in hall with PT on POD # 3 - incision ok at time of dc Antibiotics given:  Anti-infectives   Start     Dose/Rate Route Frequency Ordered Stop   05/27/12 1400  ceFAZolin (ANCEF) IVPB 1 g/50 mL premix     1 g 100 mL/hr over 30 Minutes Intravenous Every 6 hours 05/27/12 1327 05/27/12 2156   05/27/12 0600  ceFAZolin (ANCEF) IVPB 2 g/50 mL premix     2 g 100 mL/hr over 30 Minutes Intravenous On call to O.R. 05/26/12 1706 05/27/12 0743    .  Recent vital signs:  Filed Vitals:   05/30/12 0557  BP: 110/64  Pulse: 60  Temp: 98.7 F (37.1 C)  Resp: 16    Recent laboratory studies:  Results for orders placed during the hospital encounter of 05/27/12  PROTIME-INR      Result Value Range   Prothrombin Time 14.0  11.6 - 15.2 seconds   INR 1.09  0.00 - 1.49  CBC      Result Value Range   WBC 6.1  4.0 - 10.5 K/uL   RBC 3.29 (*) 3.87 - 5.11 MIL/uL   Hemoglobin 11.2 (*) 12.0 - 15.0 g/dL   HCT 40.9 (*) 81.1 - 91.4 %   MCV 97.3  78.0 - 100.0 fL   MCH 34.0  26.0 - 34.0 pg   MCHC 35.0  30.0 - 36.0 g/dL   RDW 78.2  95.6 - 21.3 %   Platelets 192  150 - 400 K/uL  BASIC METABOLIC PANEL      Result Value Range   Sodium 140  135 - 145 mEq/L   Potassium 3.0 (*) 3.5 - 5.1 mEq/L   Chloride 100  96 -  112 mEq/L   CO2 31  19 - 32 mEq/L   Glucose, Bld 137 (*) 70 - 99 mg/dL   BUN 10  6 - 23 mg/dL   Creatinine, Ser 0.86  0.50 - 1.10 mg/dL   Calcium 8.2 (*) 8.4 - 10.5 mg/dL   GFR calc non Af Amer 86 (*) >90 mL/min   GFR calc Af Amer >90  >90 mL/min  PROTIME-INR      Result Value Range   Prothrombin Time 20.4 (*) 11.6 - 15.2 seconds   INR 1.82 (*) 0.00 - 1.49  CBC      Result Value Range   WBC 7.7  4.0 - 10.5 K/uL   RBC 3.10 (*) 3.87 - 5.11 MIL/uL   Hemoglobin 10.4 (*) 12.0 - 15.0 g/dL   HCT 57.8 (*) 46.9 - 62.9 %   MCV 98.1  78.0 - 100.0 fL   MCH 33.5  26.0 - 34.0 pg   MCHC 34.2  30.0 - 36.0 g/dL   RDW 52.8  41.3 - 24.4 %   Platelets 187  150 -  400 K/uL  PROTIME-INR      Result Value Range   Prothrombin Time 24.0 (*) 11.6 - 15.2 seconds   INR 2.26 (*) 0.00 - 1.49  CBC      Result Value Range   WBC 7.0  4.0 - 10.5 K/uL   RBC 2.83 (*) 3.87 - 5.11 MIL/uL   Hemoglobin 9.8 (*) 12.0 - 15.0 g/dL   HCT 16.1 (*) 09.6 - 04.5 %   MCV 98.2  78.0 - 100.0 fL   MCH 34.6 (*) 26.0 - 34.0 pg   MCHC 35.3  30.0 - 36.0 g/dL   RDW 40.9  81.1 - 91.4 %   Platelets 176  150 - 400 K/uL    Discharge Medications:     Medication List    TAKE these medications       methocarbamol 500 MG tablet  Commonly known as:  ROBAXIN  Take 1 tablet (500 mg total) by mouth every 6 (six) hours as needed.     nisoldipine 20 MG 24 hr tablet  Commonly known as:  SULAR  Take 20 mg by mouth daily.     oxyCODONE 5 MG immediate release tablet  Commonly known as:  Oxy IR/ROXICODONE  Take 1-2 tablets (5-10 mg total) by mouth every 3 (three) hours as needed.     telmisartan-hydrochlorothiazide 80-25 MG per tablet  Commonly known as:  MICARDIS HCT  Take 1 tablet by mouth daily.     warfarin 5 MG tablet  Commonly known as:  COUMADIN  Take 1 tablet (5 mg total) by mouth daily.        Diagnostic Studies: Dg Chest 2 View  05/20/2012  *RADIOLOGY REPORT*  Clinical Data: Hypertension.  CHEST - 2 VIEW   Comparison: None.  Findings: Cardiomediastinal silhouette appears normal.  No acute pulmonary disease is noted.  Bony thorax is intact.  Old compression fracture of a lower thoracic vertebral body is noted.  IMPRESSION: No acute cardiopulmonary abnormality seen.   Original Report Authenticated By: Lupita Raider.,  M.D.     Disposition: 06-Home-Health Care Svc      Discharge Orders   Future Orders Complete By Expires     Call MD / Call 911  As directed     Comments:      If you experience chest pain or shortness of breath, CALL 911 and be transported to the hospital emergency room.  If you develope a fever above 101 F, pus (white drainage) or increased drainage or redness at the wound, or calf pain, call your surgeon's office.    Constipation Prevention  As directed     Comments:      Drink plenty of fluids.  Prune juice may be helpful.  You may use a stool softener, such as Colace (over the counter) 100 mg twice a day.  Use MiraLax (over the counter) for constipation as needed.    Diet - low sodium heart healthy  As directed     Discharge instructions  As directed     Comments:      1. CPM machine 2 hours per 8 for 6 hours per day total 2.Weight bearing as tolerated 3. Change dressing on Sunday and Tuesday - ok to leave opne to air and to shower without covering next thursday    Increase activity slowly as tolerated  As directed           Signed: Olufemi Mofield SCOTT 06/16/2012, 2:17 PM

## 2012-06-17 DIAGNOSIS — Z4801 Encounter for change or removal of surgical wound dressing: Secondary | ICD-10-CM | POA: Diagnosis not present

## 2012-06-17 DIAGNOSIS — Z96659 Presence of unspecified artificial knee joint: Secondary | ICD-10-CM | POA: Diagnosis not present

## 2012-06-17 DIAGNOSIS — I10 Essential (primary) hypertension: Secondary | ICD-10-CM | POA: Diagnosis not present

## 2012-06-17 DIAGNOSIS — Z471 Aftercare following joint replacement surgery: Secondary | ICD-10-CM | POA: Diagnosis not present

## 2012-06-17 DIAGNOSIS — Z5181 Encounter for therapeutic drug level monitoring: Secondary | ICD-10-CM | POA: Diagnosis not present

## 2012-06-18 DIAGNOSIS — I10 Essential (primary) hypertension: Secondary | ICD-10-CM | POA: Diagnosis not present

## 2012-06-18 DIAGNOSIS — Z471 Aftercare following joint replacement surgery: Secondary | ICD-10-CM | POA: Diagnosis not present

## 2012-06-18 DIAGNOSIS — Z4801 Encounter for change or removal of surgical wound dressing: Secondary | ICD-10-CM | POA: Diagnosis not present

## 2012-06-18 DIAGNOSIS — Z96659 Presence of unspecified artificial knee joint: Secondary | ICD-10-CM | POA: Diagnosis not present

## 2012-06-18 DIAGNOSIS — Z5181 Encounter for therapeutic drug level monitoring: Secondary | ICD-10-CM | POA: Diagnosis not present

## 2012-06-20 DIAGNOSIS — I10 Essential (primary) hypertension: Secondary | ICD-10-CM | POA: Diagnosis not present

## 2012-06-20 DIAGNOSIS — Z471 Aftercare following joint replacement surgery: Secondary | ICD-10-CM | POA: Diagnosis not present

## 2012-06-20 DIAGNOSIS — Z96659 Presence of unspecified artificial knee joint: Secondary | ICD-10-CM | POA: Diagnosis not present

## 2012-06-20 DIAGNOSIS — Z4801 Encounter for change or removal of surgical wound dressing: Secondary | ICD-10-CM | POA: Diagnosis not present

## 2012-06-20 DIAGNOSIS — Z5181 Encounter for therapeutic drug level monitoring: Secondary | ICD-10-CM | POA: Diagnosis not present

## 2012-06-23 DIAGNOSIS — Z4801 Encounter for change or removal of surgical wound dressing: Secondary | ICD-10-CM | POA: Diagnosis not present

## 2012-06-23 DIAGNOSIS — Z471 Aftercare following joint replacement surgery: Secondary | ICD-10-CM | POA: Diagnosis not present

## 2012-06-23 DIAGNOSIS — Z5181 Encounter for therapeutic drug level monitoring: Secondary | ICD-10-CM | POA: Diagnosis not present

## 2012-06-23 DIAGNOSIS — I10 Essential (primary) hypertension: Secondary | ICD-10-CM | POA: Diagnosis not present

## 2012-06-23 DIAGNOSIS — Z96659 Presence of unspecified artificial knee joint: Secondary | ICD-10-CM | POA: Diagnosis not present

## 2012-06-24 DIAGNOSIS — Z96659 Presence of unspecified artificial knee joint: Secondary | ICD-10-CM | POA: Diagnosis not present

## 2012-06-24 DIAGNOSIS — Z4801 Encounter for change or removal of surgical wound dressing: Secondary | ICD-10-CM | POA: Diagnosis not present

## 2012-06-24 DIAGNOSIS — I10 Essential (primary) hypertension: Secondary | ICD-10-CM | POA: Diagnosis not present

## 2012-06-24 DIAGNOSIS — Z471 Aftercare following joint replacement surgery: Secondary | ICD-10-CM | POA: Diagnosis not present

## 2012-06-24 DIAGNOSIS — Z5181 Encounter for therapeutic drug level monitoring: Secondary | ICD-10-CM | POA: Diagnosis not present

## 2012-06-25 DIAGNOSIS — Z5181 Encounter for therapeutic drug level monitoring: Secondary | ICD-10-CM | POA: Diagnosis not present

## 2012-06-25 DIAGNOSIS — Z96659 Presence of unspecified artificial knee joint: Secondary | ICD-10-CM | POA: Diagnosis not present

## 2012-06-25 DIAGNOSIS — Z471 Aftercare following joint replacement surgery: Secondary | ICD-10-CM | POA: Diagnosis not present

## 2012-06-25 DIAGNOSIS — Z4801 Encounter for change or removal of surgical wound dressing: Secondary | ICD-10-CM | POA: Diagnosis not present

## 2012-06-25 DIAGNOSIS — I10 Essential (primary) hypertension: Secondary | ICD-10-CM | POA: Diagnosis not present

## 2012-06-27 DIAGNOSIS — M1710 Unilateral primary osteoarthritis, unspecified knee: Secondary | ICD-10-CM | POA: Diagnosis not present

## 2012-06-27 DIAGNOSIS — Z96659 Presence of unspecified artificial knee joint: Secondary | ICD-10-CM | POA: Diagnosis not present

## 2012-06-30 DIAGNOSIS — M1710 Unilateral primary osteoarthritis, unspecified knee: Secondary | ICD-10-CM | POA: Diagnosis not present

## 2012-06-30 DIAGNOSIS — Z96659 Presence of unspecified artificial knee joint: Secondary | ICD-10-CM | POA: Diagnosis not present

## 2012-07-02 DIAGNOSIS — M1710 Unilateral primary osteoarthritis, unspecified knee: Secondary | ICD-10-CM | POA: Diagnosis not present

## 2012-07-02 DIAGNOSIS — Z96659 Presence of unspecified artificial knee joint: Secondary | ICD-10-CM | POA: Diagnosis not present

## 2012-07-09 DIAGNOSIS — M1710 Unilateral primary osteoarthritis, unspecified knee: Secondary | ICD-10-CM | POA: Diagnosis not present

## 2012-07-09 DIAGNOSIS — Z96659 Presence of unspecified artificial knee joint: Secondary | ICD-10-CM | POA: Diagnosis not present

## 2012-07-11 DIAGNOSIS — Z96659 Presence of unspecified artificial knee joint: Secondary | ICD-10-CM | POA: Diagnosis not present

## 2012-07-11 DIAGNOSIS — M1710 Unilateral primary osteoarthritis, unspecified knee: Secondary | ICD-10-CM | POA: Diagnosis not present

## 2012-07-14 DIAGNOSIS — Z96659 Presence of unspecified artificial knee joint: Secondary | ICD-10-CM | POA: Diagnosis not present

## 2012-07-14 DIAGNOSIS — M1710 Unilateral primary osteoarthritis, unspecified knee: Secondary | ICD-10-CM | POA: Diagnosis not present

## 2012-07-17 DIAGNOSIS — Z96659 Presence of unspecified artificial knee joint: Secondary | ICD-10-CM | POA: Diagnosis not present

## 2012-07-17 DIAGNOSIS — M1710 Unilateral primary osteoarthritis, unspecified knee: Secondary | ICD-10-CM | POA: Diagnosis not present

## 2012-07-22 ENCOUNTER — Ambulatory Visit (INDEPENDENT_AMBULATORY_CARE_PROVIDER_SITE_OTHER): Payer: Medicare Other | Admitting: Family Medicine

## 2012-07-22 VITALS — BP 117/70 | HR 77 | Temp 98.2°F | Resp 18 | Wt 146.0 lb

## 2012-07-22 DIAGNOSIS — L259 Unspecified contact dermatitis, unspecified cause: Secondary | ICD-10-CM | POA: Diagnosis not present

## 2012-07-22 DIAGNOSIS — L239 Allergic contact dermatitis, unspecified cause: Secondary | ICD-10-CM

## 2012-07-22 MED ORDER — HYDROXYZINE HCL 10 MG PO TABS: 10.0000 mg | ORAL_TABLET | Freq: Three times a day (TID) | ORAL | Status: DC | PRN

## 2012-07-22 NOTE — Patient Instructions (Signed)

## 2012-07-22 NOTE — Progress Notes (Signed)
Chief complaint: Itching scalp  History of present illness: Patient is a 78 year old female who is here with a one-day duration of itchy scalp. [place] and used different shampoo than usual. Patient denies any fever, any chills, any bleeding. Patient has not had any itching or rash anywhere else. Patient is just concerned and wants to make sure that this is no site of infection. Patient has no history of this previously. Did notice some improvement after taking a shower.  Past Medical History  Diagnosis Date  . Hypertension   . Arthritis    Past Surgical History  Procedure Laterality Date  . Abdominal hysterectomy    . Total knee arthroplasty Right 05/27/2012    Procedure: TOTAL KNEE ARTHROPLASTY;  Surgeon: Cammy Copa, MD;  Location: [facility];  Service: Orthopedics;  Laterality: Right;  Right Total Knee Arthroplasty  . Joint replacement    . Tubal ligation     Family History  Problem Relation Age of Onset  . Cancer Mother   . Heart disease Father   . Cancer Brother    History  Substance Use Topics  . Smoking status: Former Games developer  . Smokeless tobacco: Not on file  . Alcohol Use: 0.6 oz/week    1 Glasses of wine per week   Physical exam Blood pressure 117/70, pulse 77, temperature 98.2 F (36.8 C), temperature source Oral, resp. rate 18, weight 146 lb (66.225 kg). General: No apparent distress alert and oriented x3 mood and affect normal Respiratory: Patient's speak in full sentences and does not appear short of breath Skin: Warm dry intact with no signs of infection or rash Neuro: Cranial nerves II through XII are intact, neurovascularly intact in all extremities with 2+ DTRs and 2+ pulses. Scalp: On inspection it appears the patient does have a contact dermatitis. There is no significant redundant skin and no bleeding noted. There is no signs of lites her bed does. No signs of welts wheels or any maculopapular rash.  Assessment: Contact  dermatitis likely secondary to use of cheap shampoo  Plan: Patient was given some hydroxyzine low dose and warned of potential side effects. Patient knows of signs and symptoms of infection and will come back. Patient will go home and wash her hair as well. Will return in 48 hours if not better.

## 2012-07-23 DIAGNOSIS — M1710 Unilateral primary osteoarthritis, unspecified knee: Secondary | ICD-10-CM | POA: Diagnosis not present

## 2012-07-23 DIAGNOSIS — Z96659 Presence of unspecified artificial knee joint: Secondary | ICD-10-CM | POA: Diagnosis not present

## 2012-07-29 DIAGNOSIS — M1710 Unilateral primary osteoarthritis, unspecified knee: Secondary | ICD-10-CM | POA: Diagnosis not present

## 2012-07-29 DIAGNOSIS — Z96659 Presence of unspecified artificial knee joint: Secondary | ICD-10-CM | POA: Diagnosis not present

## 2012-07-31 DIAGNOSIS — Z96659 Presence of unspecified artificial knee joint: Secondary | ICD-10-CM | POA: Diagnosis not present

## 2012-07-31 DIAGNOSIS — M1710 Unilateral primary osteoarthritis, unspecified knee: Secondary | ICD-10-CM | POA: Diagnosis not present

## 2012-08-04 DIAGNOSIS — M1710 Unilateral primary osteoarthritis, unspecified knee: Secondary | ICD-10-CM | POA: Diagnosis not present

## 2012-08-04 DIAGNOSIS — Z96659 Presence of unspecified artificial knee joint: Secondary | ICD-10-CM | POA: Diagnosis not present

## 2012-08-07 DIAGNOSIS — Z96659 Presence of unspecified artificial knee joint: Secondary | ICD-10-CM | POA: Diagnosis not present

## 2012-08-07 DIAGNOSIS — M1710 Unilateral primary osteoarthritis, unspecified knee: Secondary | ICD-10-CM | POA: Diagnosis not present

## 2012-08-11 DIAGNOSIS — M1710 Unilateral primary osteoarthritis, unspecified knee: Secondary | ICD-10-CM | POA: Diagnosis not present

## 2012-08-11 DIAGNOSIS — Z96659 Presence of unspecified artificial knee joint: Secondary | ICD-10-CM | POA: Diagnosis not present

## 2012-08-14 DIAGNOSIS — M1710 Unilateral primary osteoarthritis, unspecified knee: Secondary | ICD-10-CM | POA: Diagnosis not present

## 2012-08-14 DIAGNOSIS — Z96659 Presence of unspecified artificial knee joint: Secondary | ICD-10-CM | POA: Diagnosis not present

## 2012-08-19 DIAGNOSIS — Z96659 Presence of unspecified artificial knee joint: Secondary | ICD-10-CM | POA: Diagnosis not present

## 2012-08-19 DIAGNOSIS — M1710 Unilateral primary osteoarthritis, unspecified knee: Secondary | ICD-10-CM | POA: Diagnosis not present

## 2012-08-27 DIAGNOSIS — M1710 Unilateral primary osteoarthritis, unspecified knee: Secondary | ICD-10-CM | POA: Diagnosis not present

## 2012-08-27 DIAGNOSIS — Z96659 Presence of unspecified artificial knee joint: Secondary | ICD-10-CM | POA: Diagnosis not present

## 2012-09-01 DIAGNOSIS — N6019 Diffuse cystic mastopathy of unspecified breast: Secondary | ICD-10-CM | POA: Diagnosis not present

## 2012-09-03 DIAGNOSIS — M1710 Unilateral primary osteoarthritis, unspecified knee: Secondary | ICD-10-CM | POA: Diagnosis not present

## 2012-09-03 DIAGNOSIS — Z96659 Presence of unspecified artificial knee joint: Secondary | ICD-10-CM | POA: Diagnosis not present

## 2012-09-05 DIAGNOSIS — Z96659 Presence of unspecified artificial knee joint: Secondary | ICD-10-CM | POA: Diagnosis not present

## 2012-09-05 DIAGNOSIS — M1710 Unilateral primary osteoarthritis, unspecified knee: Secondary | ICD-10-CM | POA: Diagnosis not present

## 2012-09-12 DIAGNOSIS — M1710 Unilateral primary osteoarthritis, unspecified knee: Secondary | ICD-10-CM | POA: Diagnosis not present

## 2012-09-12 DIAGNOSIS — Z96659 Presence of unspecified artificial knee joint: Secondary | ICD-10-CM | POA: Diagnosis not present

## 2012-10-15 DIAGNOSIS — H269 Unspecified cataract: Secondary | ICD-10-CM | POA: Diagnosis not present

## 2012-10-15 DIAGNOSIS — H3530 Unspecified macular degeneration: Secondary | ICD-10-CM | POA: Diagnosis not present

## 2012-10-15 DIAGNOSIS — H43819 Vitreous degeneration, unspecified eye: Secondary | ICD-10-CM | POA: Diagnosis not present

## 2012-10-15 DIAGNOSIS — H43399 Other vitreous opacities, unspecified eye: Secondary | ICD-10-CM | POA: Diagnosis not present

## 2012-12-02 DIAGNOSIS — H269 Unspecified cataract: Secondary | ICD-10-CM | POA: Diagnosis not present

## 2012-12-02 DIAGNOSIS — H43399 Other vitreous opacities, unspecified eye: Secondary | ICD-10-CM | POA: Diagnosis not present

## 2012-12-02 DIAGNOSIS — H3530 Unspecified macular degeneration: Secondary | ICD-10-CM | POA: Diagnosis not present

## 2012-12-02 DIAGNOSIS — H43819 Vitreous degeneration, unspecified eye: Secondary | ICD-10-CM | POA: Diagnosis not present

## 2012-12-22 DIAGNOSIS — I10 Essential (primary) hypertension: Secondary | ICD-10-CM | POA: Diagnosis not present

## 2012-12-22 DIAGNOSIS — E559 Vitamin D deficiency, unspecified: Secondary | ICD-10-CM | POA: Diagnosis not present

## 2012-12-22 DIAGNOSIS — Z0000 Encounter for general adult medical examination without abnormal findings: Secondary | ICD-10-CM | POA: Diagnosis not present

## 2012-12-25 DIAGNOSIS — Z0000 Encounter for general adult medical examination without abnormal findings: Secondary | ICD-10-CM | POA: Diagnosis not present

## 2012-12-25 DIAGNOSIS — Z1331 Encounter for screening for depression: Secondary | ICD-10-CM | POA: Diagnosis not present

## 2012-12-25 DIAGNOSIS — I10 Essential (primary) hypertension: Secondary | ICD-10-CM | POA: Diagnosis not present

## 2012-12-25 DIAGNOSIS — Z23 Encounter for immunization: Secondary | ICD-10-CM | POA: Diagnosis not present

## 2013-08-17 DIAGNOSIS — M1710 Unilateral primary osteoarthritis, unspecified knee: Secondary | ICD-10-CM | POA: Diagnosis not present

## 2013-11-30 DIAGNOSIS — Z1231 Encounter for screening mammogram for malignant neoplasm of breast: Secondary | ICD-10-CM | POA: Diagnosis not present

## 2013-12-01 DIAGNOSIS — Z23 Encounter for immunization: Secondary | ICD-10-CM | POA: Diagnosis not present

## 2013-12-28 DIAGNOSIS — I10 Essential (primary) hypertension: Secondary | ICD-10-CM | POA: Diagnosis not present

## 2013-12-31 DIAGNOSIS — I10 Essential (primary) hypertension: Secondary | ICD-10-CM | POA: Diagnosis not present

## 2013-12-31 DIAGNOSIS — Z0001 Encounter for general adult medical examination with abnormal findings: Secondary | ICD-10-CM | POA: Diagnosis not present

## 2013-12-31 DIAGNOSIS — E559 Vitamin D deficiency, unspecified: Secondary | ICD-10-CM | POA: Diagnosis not present

## 2013-12-31 DIAGNOSIS — R7309 Other abnormal glucose: Secondary | ICD-10-CM | POA: Diagnosis not present

## 2014-02-08 DIAGNOSIS — H3532 Exudative age-related macular degeneration: Secondary | ICD-10-CM | POA: Diagnosis not present

## 2014-02-08 DIAGNOSIS — H2513 Age-related nuclear cataract, bilateral: Secondary | ICD-10-CM | POA: Diagnosis not present

## 2014-02-08 DIAGNOSIS — H3531 Nonexudative age-related macular degeneration: Secondary | ICD-10-CM | POA: Diagnosis not present

## 2014-02-12 ENCOUNTER — Encounter (INDEPENDENT_AMBULATORY_CARE_PROVIDER_SITE_OTHER): Payer: Medicare Other | Admitting: Ophthalmology

## 2014-02-12 DIAGNOSIS — I10 Essential (primary) hypertension: Secondary | ICD-10-CM

## 2014-02-12 DIAGNOSIS — H43813 Vitreous degeneration, bilateral: Secondary | ICD-10-CM

## 2014-02-12 DIAGNOSIS — H3532 Exudative age-related macular degeneration: Secondary | ICD-10-CM

## 2014-02-12 DIAGNOSIS — H35033 Hypertensive retinopathy, bilateral: Secondary | ICD-10-CM | POA: Diagnosis not present

## 2014-02-12 DIAGNOSIS — H3531 Nonexudative age-related macular degeneration: Secondary | ICD-10-CM | POA: Diagnosis not present

## 2014-03-10 ENCOUNTER — Encounter (INDEPENDENT_AMBULATORY_CARE_PROVIDER_SITE_OTHER): Payer: Medicare Other | Admitting: Ophthalmology

## 2014-03-10 DIAGNOSIS — I10 Essential (primary) hypertension: Secondary | ICD-10-CM

## 2014-03-10 DIAGNOSIS — H35033 Hypertensive retinopathy, bilateral: Secondary | ICD-10-CM | POA: Diagnosis not present

## 2014-03-10 DIAGNOSIS — H43813 Vitreous degeneration, bilateral: Secondary | ICD-10-CM | POA: Diagnosis not present

## 2014-03-10 DIAGNOSIS — H3531 Nonexudative age-related macular degeneration: Secondary | ICD-10-CM

## 2014-03-10 DIAGNOSIS — H3532 Exudative age-related macular degeneration: Secondary | ICD-10-CM | POA: Diagnosis not present

## 2014-04-07 ENCOUNTER — Encounter (INDEPENDENT_AMBULATORY_CARE_PROVIDER_SITE_OTHER): Payer: Medicare Other | Admitting: Ophthalmology

## 2014-04-07 DIAGNOSIS — I10 Essential (primary) hypertension: Secondary | ICD-10-CM | POA: Diagnosis not present

## 2014-04-07 DIAGNOSIS — H3532 Exudative age-related macular degeneration: Secondary | ICD-10-CM | POA: Diagnosis not present

## 2014-04-07 DIAGNOSIS — H2513 Age-related nuclear cataract, bilateral: Secondary | ICD-10-CM

## 2014-04-07 DIAGNOSIS — H43813 Vitreous degeneration, bilateral: Secondary | ICD-10-CM | POA: Diagnosis not present

## 2014-04-07 DIAGNOSIS — H35033 Hypertensive retinopathy, bilateral: Secondary | ICD-10-CM | POA: Diagnosis not present

## 2014-04-07 DIAGNOSIS — H3531 Nonexudative age-related macular degeneration: Secondary | ICD-10-CM | POA: Diagnosis not present

## 2014-05-05 ENCOUNTER — Encounter (INDEPENDENT_AMBULATORY_CARE_PROVIDER_SITE_OTHER): Payer: Medicare Other | Admitting: Ophthalmology

## 2014-05-05 DIAGNOSIS — H3531 Nonexudative age-related macular degeneration: Secondary | ICD-10-CM

## 2014-05-05 DIAGNOSIS — I10 Essential (primary) hypertension: Secondary | ICD-10-CM | POA: Diagnosis not present

## 2014-05-05 DIAGNOSIS — H35033 Hypertensive retinopathy, bilateral: Secondary | ICD-10-CM

## 2014-05-05 DIAGNOSIS — H43813 Vitreous degeneration, bilateral: Secondary | ICD-10-CM | POA: Diagnosis not present

## 2014-05-05 DIAGNOSIS — H3532 Exudative age-related macular degeneration: Secondary | ICD-10-CM | POA: Diagnosis not present

## 2014-06-16 ENCOUNTER — Encounter (INDEPENDENT_AMBULATORY_CARE_PROVIDER_SITE_OTHER): Payer: Medicare Other | Admitting: Ophthalmology

## 2014-06-16 DIAGNOSIS — I10 Essential (primary) hypertension: Secondary | ICD-10-CM | POA: Diagnosis not present

## 2014-06-16 DIAGNOSIS — H43813 Vitreous degeneration, bilateral: Secondary | ICD-10-CM | POA: Diagnosis not present

## 2014-06-16 DIAGNOSIS — H3531 Nonexudative age-related macular degeneration: Secondary | ICD-10-CM | POA: Diagnosis not present

## 2014-06-16 DIAGNOSIS — H3532 Exudative age-related macular degeneration: Secondary | ICD-10-CM

## 2014-06-16 DIAGNOSIS — H35033 Hypertensive retinopathy, bilateral: Secondary | ICD-10-CM | POA: Diagnosis not present

## 2014-06-16 DIAGNOSIS — H2513 Age-related nuclear cataract, bilateral: Secondary | ICD-10-CM

## 2014-07-09 DIAGNOSIS — R7309 Other abnormal glucose: Secondary | ICD-10-CM | POA: Diagnosis not present

## 2014-08-11 ENCOUNTER — Encounter (INDEPENDENT_AMBULATORY_CARE_PROVIDER_SITE_OTHER): Payer: Medicare Other | Admitting: Ophthalmology

## 2014-08-11 DIAGNOSIS — H3531 Nonexudative age-related macular degeneration: Secondary | ICD-10-CM

## 2014-08-11 DIAGNOSIS — H2513 Age-related nuclear cataract, bilateral: Secondary | ICD-10-CM | POA: Diagnosis not present

## 2014-08-11 DIAGNOSIS — H35033 Hypertensive retinopathy, bilateral: Secondary | ICD-10-CM | POA: Diagnosis not present

## 2014-08-11 DIAGNOSIS — I10 Essential (primary) hypertension: Secondary | ICD-10-CM

## 2014-08-11 DIAGNOSIS — H43813 Vitreous degeneration, bilateral: Secondary | ICD-10-CM

## 2014-08-11 DIAGNOSIS — H3532 Exudative age-related macular degeneration: Secondary | ICD-10-CM

## 2014-08-18 DIAGNOSIS — H3531 Nonexudative age-related macular degeneration: Secondary | ICD-10-CM | POA: Diagnosis not present

## 2014-08-18 DIAGNOSIS — H2513 Age-related nuclear cataract, bilateral: Secondary | ICD-10-CM | POA: Diagnosis not present

## 2014-08-18 DIAGNOSIS — H3532 Exudative age-related macular degeneration: Secondary | ICD-10-CM | POA: Diagnosis not present

## 2014-10-13 ENCOUNTER — Encounter (INDEPENDENT_AMBULATORY_CARE_PROVIDER_SITE_OTHER): Payer: Medicare Other | Admitting: Ophthalmology

## 2014-10-13 DIAGNOSIS — H35033 Hypertensive retinopathy, bilateral: Secondary | ICD-10-CM | POA: Diagnosis not present

## 2014-10-13 DIAGNOSIS — H43813 Vitreous degeneration, bilateral: Secondary | ICD-10-CM | POA: Diagnosis not present

## 2014-10-13 DIAGNOSIS — H3532 Exudative age-related macular degeneration: Secondary | ICD-10-CM | POA: Diagnosis not present

## 2014-10-13 DIAGNOSIS — H3531 Nonexudative age-related macular degeneration: Secondary | ICD-10-CM

## 2014-10-13 DIAGNOSIS — I10 Essential (primary) hypertension: Secondary | ICD-10-CM | POA: Diagnosis not present

## 2014-10-14 ENCOUNTER — Ambulatory Visit (INDEPENDENT_AMBULATORY_CARE_PROVIDER_SITE_OTHER): Payer: Medicare Other | Admitting: Ophthalmology

## 2014-12-21 DIAGNOSIS — Z1231 Encounter for screening mammogram for malignant neoplasm of breast: Secondary | ICD-10-CM | POA: Diagnosis not present

## 2014-12-22 DIAGNOSIS — Z23 Encounter for immunization: Secondary | ICD-10-CM | POA: Diagnosis not present

## 2014-12-29 ENCOUNTER — Encounter (INDEPENDENT_AMBULATORY_CARE_PROVIDER_SITE_OTHER): Payer: Medicare Other | Admitting: Ophthalmology

## 2014-12-29 DIAGNOSIS — H35033 Hypertensive retinopathy, bilateral: Secondary | ICD-10-CM | POA: Diagnosis not present

## 2014-12-29 DIAGNOSIS — H2513 Age-related nuclear cataract, bilateral: Secondary | ICD-10-CM

## 2014-12-29 DIAGNOSIS — H353122 Nonexudative age-related macular degeneration, left eye, intermediate dry stage: Secondary | ICD-10-CM

## 2014-12-29 DIAGNOSIS — I10 Essential (primary) hypertension: Secondary | ICD-10-CM | POA: Diagnosis not present

## 2014-12-29 DIAGNOSIS — H43813 Vitreous degeneration, bilateral: Secondary | ICD-10-CM | POA: Diagnosis not present

## 2014-12-29 DIAGNOSIS — H353211 Exudative age-related macular degeneration, right eye, with active choroidal neovascularization: Secondary | ICD-10-CM | POA: Diagnosis not present

## 2015-01-04 DIAGNOSIS — I10 Essential (primary) hypertension: Secondary | ICD-10-CM | POA: Diagnosis not present

## 2015-01-10 DIAGNOSIS — I10 Essential (primary) hypertension: Secondary | ICD-10-CM | POA: Diagnosis not present

## 2015-01-10 DIAGNOSIS — R7301 Impaired fasting glucose: Secondary | ICD-10-CM | POA: Diagnosis not present

## 2015-01-10 DIAGNOSIS — Z780 Asymptomatic menopausal state: Secondary | ICD-10-CM | POA: Diagnosis not present

## 2015-01-10 DIAGNOSIS — M8580 Other specified disorders of bone density and structure, unspecified site: Secondary | ICD-10-CM | POA: Diagnosis not present

## 2015-01-25 DIAGNOSIS — M79672 Pain in left foot: Secondary | ICD-10-CM | POA: Diagnosis not present

## 2015-01-25 DIAGNOSIS — M792 Neuralgia and neuritis, unspecified: Secondary | ICD-10-CM | POA: Diagnosis not present

## 2015-01-25 DIAGNOSIS — M12272 Villonodular synovitis (pigmented), left ankle and foot: Secondary | ICD-10-CM | POA: Diagnosis not present

## 2015-01-25 DIAGNOSIS — M7989 Other specified soft tissue disorders: Secondary | ICD-10-CM | POA: Diagnosis not present

## 2015-02-08 DIAGNOSIS — M792 Neuralgia and neuritis, unspecified: Secondary | ICD-10-CM | POA: Diagnosis not present

## 2015-03-30 ENCOUNTER — Encounter (INDEPENDENT_AMBULATORY_CARE_PROVIDER_SITE_OTHER): Payer: Medicare Other | Admitting: Ophthalmology

## 2015-03-30 DIAGNOSIS — H43813 Vitreous degeneration, bilateral: Secondary | ICD-10-CM

## 2015-03-30 DIAGNOSIS — H2513 Age-related nuclear cataract, bilateral: Secondary | ICD-10-CM | POA: Diagnosis not present

## 2015-03-30 DIAGNOSIS — I10 Essential (primary) hypertension: Secondary | ICD-10-CM

## 2015-03-30 DIAGNOSIS — H353211 Exudative age-related macular degeneration, right eye, with active choroidal neovascularization: Secondary | ICD-10-CM

## 2015-03-30 DIAGNOSIS — H353122 Nonexudative age-related macular degeneration, left eye, intermediate dry stage: Secondary | ICD-10-CM

## 2015-03-30 DIAGNOSIS — H35033 Hypertensive retinopathy, bilateral: Secondary | ICD-10-CM | POA: Diagnosis not present

## 2015-06-01 ENCOUNTER — Encounter (INDEPENDENT_AMBULATORY_CARE_PROVIDER_SITE_OTHER): Payer: Medicare Other | Admitting: Ophthalmology

## 2015-06-01 DIAGNOSIS — H353122 Nonexudative age-related macular degeneration, left eye, intermediate dry stage: Secondary | ICD-10-CM | POA: Diagnosis not present

## 2015-06-01 DIAGNOSIS — H353211 Exudative age-related macular degeneration, right eye, with active choroidal neovascularization: Secondary | ICD-10-CM | POA: Diagnosis not present

## 2015-06-01 DIAGNOSIS — I10 Essential (primary) hypertension: Secondary | ICD-10-CM | POA: Diagnosis not present

## 2015-06-01 DIAGNOSIS — H43813 Vitreous degeneration, bilateral: Secondary | ICD-10-CM

## 2015-06-01 DIAGNOSIS — H2513 Age-related nuclear cataract, bilateral: Secondary | ICD-10-CM | POA: Diagnosis not present

## 2015-06-01 DIAGNOSIS — H35033 Hypertensive retinopathy, bilateral: Secondary | ICD-10-CM

## 2015-06-30 DIAGNOSIS — Z96651 Presence of right artificial knee joint: Secondary | ICD-10-CM | POA: Diagnosis not present

## 2015-06-30 DIAGNOSIS — M25461 Effusion, right knee: Secondary | ICD-10-CM | POA: Diagnosis not present

## 2015-07-26 DIAGNOSIS — R7301 Impaired fasting glucose: Secondary | ICD-10-CM | POA: Diagnosis not present

## 2015-07-26 DIAGNOSIS — I10 Essential (primary) hypertension: Secondary | ICD-10-CM | POA: Diagnosis not present

## 2015-08-01 DIAGNOSIS — I10 Essential (primary) hypertension: Secondary | ICD-10-CM | POA: Diagnosis not present

## 2015-08-01 DIAGNOSIS — R7303 Prediabetes: Secondary | ICD-10-CM | POA: Diagnosis not present

## 2015-08-01 DIAGNOSIS — M8580 Other specified disorders of bone density and structure, unspecified site: Secondary | ICD-10-CM | POA: Diagnosis not present

## 2015-08-03 ENCOUNTER — Encounter (INDEPENDENT_AMBULATORY_CARE_PROVIDER_SITE_OTHER): Payer: Medicare Other | Admitting: Ophthalmology

## 2015-08-03 DIAGNOSIS — H353122 Nonexudative age-related macular degeneration, left eye, intermediate dry stage: Secondary | ICD-10-CM | POA: Diagnosis not present

## 2015-08-03 DIAGNOSIS — I10 Essential (primary) hypertension: Secondary | ICD-10-CM | POA: Diagnosis not present

## 2015-08-03 DIAGNOSIS — H43813 Vitreous degeneration, bilateral: Secondary | ICD-10-CM

## 2015-08-03 DIAGNOSIS — H35033 Hypertensive retinopathy, bilateral: Secondary | ICD-10-CM | POA: Diagnosis not present

## 2015-08-03 DIAGNOSIS — H353211 Exudative age-related macular degeneration, right eye, with active choroidal neovascularization: Secondary | ICD-10-CM

## 2015-08-22 DIAGNOSIS — Z96651 Presence of right artificial knee joint: Secondary | ICD-10-CM | POA: Diagnosis not present

## 2015-08-22 DIAGNOSIS — M25461 Effusion, right knee: Secondary | ICD-10-CM | POA: Diagnosis not present

## 2015-10-19 ENCOUNTER — Encounter (INDEPENDENT_AMBULATORY_CARE_PROVIDER_SITE_OTHER): Payer: Medicare Other | Admitting: Ophthalmology

## 2015-10-19 DIAGNOSIS — H353211 Exudative age-related macular degeneration, right eye, with active choroidal neovascularization: Secondary | ICD-10-CM

## 2015-10-19 DIAGNOSIS — I10 Essential (primary) hypertension: Secondary | ICD-10-CM

## 2015-10-19 DIAGNOSIS — H43813 Vitreous degeneration, bilateral: Secondary | ICD-10-CM | POA: Diagnosis not present

## 2015-10-19 DIAGNOSIS — H35033 Hypertensive retinopathy, bilateral: Secondary | ICD-10-CM

## 2015-10-19 DIAGNOSIS — H2513 Age-related nuclear cataract, bilateral: Secondary | ICD-10-CM

## 2015-10-19 DIAGNOSIS — H353122 Nonexudative age-related macular degeneration, left eye, intermediate dry stage: Secondary | ICD-10-CM | POA: Diagnosis not present

## 2015-10-24 DIAGNOSIS — D225 Melanocytic nevi of trunk: Secondary | ICD-10-CM | POA: Diagnosis not present

## 2015-10-24 DIAGNOSIS — L821 Other seborrheic keratosis: Secondary | ICD-10-CM | POA: Diagnosis not present

## 2015-10-24 DIAGNOSIS — D1801 Hemangioma of skin and subcutaneous tissue: Secondary | ICD-10-CM | POA: Diagnosis not present

## 2015-10-24 DIAGNOSIS — L570 Actinic keratosis: Secondary | ICD-10-CM | POA: Diagnosis not present

## 2015-10-24 DIAGNOSIS — L812 Freckles: Secondary | ICD-10-CM | POA: Diagnosis not present

## 2015-10-24 DIAGNOSIS — L84 Corns and callosities: Secondary | ICD-10-CM | POA: Diagnosis not present

## 2015-11-30 DIAGNOSIS — H353130 Nonexudative age-related macular degeneration, bilateral, stage unspecified: Secondary | ICD-10-CM | POA: Diagnosis not present

## 2015-11-30 DIAGNOSIS — H2513 Age-related nuclear cataract, bilateral: Secondary | ICD-10-CM | POA: Diagnosis not present

## 2015-11-30 DIAGNOSIS — H353230 Exudative age-related macular degeneration, bilateral, stage unspecified: Secondary | ICD-10-CM | POA: Diagnosis not present

## 2015-12-01 ENCOUNTER — Ambulatory Visit (INDEPENDENT_AMBULATORY_CARE_PROVIDER_SITE_OTHER): Payer: Medicare Other | Admitting: Orthopedic Surgery

## 2015-12-01 DIAGNOSIS — M25461 Effusion, right knee: Secondary | ICD-10-CM | POA: Diagnosis not present

## 2015-12-01 DIAGNOSIS — Z96651 Presence of right artificial knee joint: Secondary | ICD-10-CM | POA: Diagnosis not present

## 2015-12-07 DIAGNOSIS — Z23 Encounter for immunization: Secondary | ICD-10-CM | POA: Diagnosis not present

## 2015-12-22 DIAGNOSIS — Z1231 Encounter for screening mammogram for malignant neoplasm of breast: Secondary | ICD-10-CM | POA: Diagnosis not present

## 2015-12-28 ENCOUNTER — Encounter (INDEPENDENT_AMBULATORY_CARE_PROVIDER_SITE_OTHER): Payer: Medicare Other | Admitting: Ophthalmology

## 2015-12-28 DIAGNOSIS — H353122 Nonexudative age-related macular degeneration, left eye, intermediate dry stage: Secondary | ICD-10-CM

## 2015-12-28 DIAGNOSIS — I10 Essential (primary) hypertension: Secondary | ICD-10-CM | POA: Diagnosis not present

## 2015-12-28 DIAGNOSIS — H43813 Vitreous degeneration, bilateral: Secondary | ICD-10-CM | POA: Diagnosis not present

## 2015-12-28 DIAGNOSIS — H353211 Exudative age-related macular degeneration, right eye, with active choroidal neovascularization: Secondary | ICD-10-CM

## 2015-12-28 DIAGNOSIS — H35033 Hypertensive retinopathy, bilateral: Secondary | ICD-10-CM | POA: Diagnosis not present

## 2016-01-31 DIAGNOSIS — R7303 Prediabetes: Secondary | ICD-10-CM | POA: Diagnosis not present

## 2016-01-31 DIAGNOSIS — Z0000 Encounter for general adult medical examination without abnormal findings: Secondary | ICD-10-CM | POA: Diagnosis not present

## 2016-01-31 DIAGNOSIS — I10 Essential (primary) hypertension: Secondary | ICD-10-CM | POA: Diagnosis not present

## 2016-02-06 DIAGNOSIS — M8580 Other specified disorders of bone density and structure, unspecified site: Secondary | ICD-10-CM | POA: Diagnosis not present

## 2016-02-06 DIAGNOSIS — Z23 Encounter for immunization: Secondary | ICD-10-CM | POA: Diagnosis not present

## 2016-02-06 DIAGNOSIS — R072 Precordial pain: Secondary | ICD-10-CM | POA: Diagnosis not present

## 2016-02-06 DIAGNOSIS — I10 Essential (primary) hypertension: Secondary | ICD-10-CM | POA: Diagnosis not present

## 2016-02-06 DIAGNOSIS — R7303 Prediabetes: Secondary | ICD-10-CM | POA: Diagnosis not present

## 2016-02-29 DIAGNOSIS — I10 Essential (primary) hypertension: Secondary | ICD-10-CM | POA: Diagnosis not present

## 2016-02-29 DIAGNOSIS — J438 Other emphysema: Secondary | ICD-10-CM | POA: Diagnosis not present

## 2016-02-29 DIAGNOSIS — R7303 Prediabetes: Secondary | ICD-10-CM | POA: Diagnosis not present

## 2016-02-29 DIAGNOSIS — R0789 Other chest pain: Secondary | ICD-10-CM | POA: Diagnosis not present

## 2016-03-14 ENCOUNTER — Encounter (INDEPENDENT_AMBULATORY_CARE_PROVIDER_SITE_OTHER): Payer: Medicare Other | Admitting: Ophthalmology

## 2016-03-16 ENCOUNTER — Encounter (INDEPENDENT_AMBULATORY_CARE_PROVIDER_SITE_OTHER): Payer: Medicare Other | Admitting: Ophthalmology

## 2016-03-16 DIAGNOSIS — I10 Essential (primary) hypertension: Secondary | ICD-10-CM | POA: Diagnosis not present

## 2016-03-16 DIAGNOSIS — H2513 Age-related nuclear cataract, bilateral: Secondary | ICD-10-CM | POA: Diagnosis not present

## 2016-03-16 DIAGNOSIS — H353211 Exudative age-related macular degeneration, right eye, with active choroidal neovascularization: Secondary | ICD-10-CM

## 2016-03-16 DIAGNOSIS — H35033 Hypertensive retinopathy, bilateral: Secondary | ICD-10-CM | POA: Diagnosis not present

## 2016-03-16 DIAGNOSIS — H43813 Vitreous degeneration, bilateral: Secondary | ICD-10-CM | POA: Diagnosis not present

## 2016-03-16 DIAGNOSIS — H353122 Nonexudative age-related macular degeneration, left eye, intermediate dry stage: Secondary | ICD-10-CM | POA: Diagnosis not present

## 2016-03-19 DIAGNOSIS — Z780 Asymptomatic menopausal state: Secondary | ICD-10-CM | POA: Diagnosis not present

## 2016-03-19 DIAGNOSIS — I10 Essential (primary) hypertension: Secondary | ICD-10-CM | POA: Diagnosis not present

## 2016-03-19 DIAGNOSIS — R7303 Prediabetes: Secondary | ICD-10-CM | POA: Diagnosis not present

## 2016-03-23 DIAGNOSIS — R0789 Other chest pain: Secondary | ICD-10-CM | POA: Diagnosis not present

## 2016-04-02 ENCOUNTER — Ambulatory Visit (INDEPENDENT_AMBULATORY_CARE_PROVIDER_SITE_OTHER): Payer: Medicare Other | Admitting: Orthopedic Surgery

## 2016-04-02 ENCOUNTER — Encounter (INDEPENDENT_AMBULATORY_CARE_PROVIDER_SITE_OTHER): Payer: Self-pay | Admitting: Orthopedic Surgery

## 2016-04-02 DIAGNOSIS — M25461 Effusion, right knee: Secondary | ICD-10-CM | POA: Diagnosis not present

## 2016-04-02 DIAGNOSIS — Z96651 Presence of right artificial knee joint: Secondary | ICD-10-CM | POA: Diagnosis not present

## 2016-04-02 MED ORDER — LIDOCAINE HCL 1 % IJ SOLN
5.0000 mL | INTRAMUSCULAR | Status: AC | PRN
  Administered 2016-04-02: 5 mL

## 2016-04-02 NOTE — Progress Notes (Signed)
Office Visit Note   Patient: Amy Carroll           Date of Birth: November 03, 1934           MRN: [id number] Visit Date: 04/02/2016 Requested by: Lucilla Lame, MD [address] [place], [place] [address] PCP: Lucilla Lame, MD  Subjective: Chief Complaint  Patient presents with  . Right Knee - Follow-up    HPI Amy Carroll is a patient with right knee effusion.  She had a total knee replacement in 2014.  She's had no fevers and chills.  Labs in October 2017 showed slight increase in CRP but sedimentation rate was normal.  She states that the knee stays swollen all the time but she really denies any fever chills or pain in the knee.  No smoking no recent travel.  No medications for this problem.              Review of Systems All systems reviewed are negative as they relate to the chief complaint within the history of present illness.  Patient denies  fevers or chills.    Assessment & Plan: Visit Diagnoses:  1. Presence of right artificial knee joint   2. Effusion, right knee     Plan: Impression is right knee effusion.  Plan on a regional labs today and I also want to aspirate that knee.  Did get a little bit of some cloudy fluid out and I sent that for Gram stain aerobic and anaerobic culture as well as cell count.  This could be either occult indolent infection or potentially polyethylene wear which is giving her a reaction.  We'll see what the culture shows no call her with those results  Follow-Up Instructions: No Follow-up on file.   Orders:  No orders of the defined types were placed in this encounter.  No orders of the defined types were placed in this encounter.     Procedures: Large Joint Inj Date/Time: 04/02/2016 3:00 PM Performed by: Meredith Pel Authorized by: Meredith Pel   Consent Given by:  Patient Site marked: the procedure site was marked   Timeout: prior to procedure the correct patient, procedure, and site was  verified   Indications:  Pain, joint swelling and diagnostic evaluation Location:  Knee Site:  R knee Prep: patient was prepped and draped in usual sterile fashion   Needle Size:  18 G Needle Length:  1.5 inches Approach:  Superolateral Ultrasound Guidance: No   Fluoroscopic Guidance: No   Arthrogram: No   Medications:  5 mL lidocaine 1 % Aspiration Attempted: Yes   Aspirate amount (mL):  45 Aspirate:  Cloudy Patient tolerance:  Patient tolerated the procedure well with no immediate complications   He should be noted that with this particular aspiration great care was taken to prep and drape with alcohol Betadine as well as chlorhexidine gluconate.  This was done on both the aspiration portion as well as the numbing portion of the procedure  Clinical Data: No additional findings.  Objective: Vital Signs: There were no vitals taken for this visit.  Physical Exam   Constitutional: Patient appears well-developed HEENT:  Head: Normocephalic Eyes:EOM are normal Neck: Normal range of motion Cardiovascular: Normal rate Pulmonary/chest: Effort normal Neurologic: Patient is alert Skin: Skin is warm Psychiatric: Patient has normal mood and affect    Ortho Exam examination the right knee demonstrates excellent range of motion moderate effusion no proximal lymphadenopathy diminished warmth with the right knee compared to  her last visit.  Collateral ligaments are stable.  Extensor mechanism is intact.  Skin is intact around the right knee.    Specialty Comments:  No specialty comments available.  Imaging: No results found.   PMFS History: Patient Active Problem List   Diagnosis Date Noted  . Presence of right artificial knee joint 04/02/2016  . Effusion, right knee 04/02/2016  . Osteoarthritis of right knee 05/27/2012    Class: Diagnosis of   Past Medical History:  Diagnosis Date  . Arthritis   . Hypertension     Family History  Problem Relation Age of Onset  .  Cancer Mother   . Heart disease Father   . Cancer Brother     Past Surgical History:  Procedure Laterality Date  . ABDOMINAL HYSTERECTOMY    . JOINT REPLACEMENT    . TOTAL KNEE ARTHROPLASTY Right 05/27/2012   Procedure: TOTAL KNEE ARTHROPLASTY;  Surgeon: Meredith Pel, MD;  Location: [facility];  Service: Orthopedics;  Laterality: Right;  Right Total Knee Arthroplasty  . TUBAL LIGATION     Social History   Occupational History  . Not on file.   Social History Main Topics  . Smoking status: Former Research scientist (life sciences)  . Smokeless tobacco: Never Used  . Alcohol use 0.6 oz/week    1 Glasses of wine per week  . Drug use: No  . Sexual activity: Not on file

## 2016-04-03 LAB — CBC WITH DIFFERENTIAL/PLATELET
BASOS ABS: 0 {cells}/uL (ref 0–200)
Basophils Relative: 0 %
Eosinophils Absolute: 54 cells/uL (ref 15–500)
Eosinophils Relative: 1 %
HCT: 39.4 % (ref 35.0–45.0)
Hemoglobin: 13.2 g/dL (ref 11.7–15.5)
LYMPHS PCT: 29 %
Lymphs Abs: 1566 cells/uL (ref 850–3900)
MCH: 32.1 pg (ref 27.0–33.0)
MCHC: 33.5 g/dL (ref 32.0–36.0)
MCV: 95.9 fL (ref 80.0–100.0)
MONOS PCT: 9 %
MPV: 9.1 fL (ref 7.5–12.5)
Monocytes Absolute: 486 cells/uL (ref 200–950)
Neutro Abs: 3294 cells/uL (ref 1500–7800)
Neutrophils Relative %: 61 %
PLATELETS: 317 10*3/uL (ref 140–400)
RBC: 4.11 MIL/uL (ref 3.80–5.10)
RDW: 13.3 % (ref 11.0–15.0)
WBC: 5.4 10*3/uL (ref 3.8–10.8)

## 2016-04-03 LAB — SEDIMENTATION RATE: SED RATE: 7 mm/h (ref 0–30)

## 2016-04-03 LAB — SYNOVIAL CELL COUNT + DIFF, W/ CRYSTALS
BASOPHILS, %: 0 %
EOSINOPHILS-SYNOVIAL: 0 % (ref 0–2)
LYMPHOCYTES-SYNOVIAL FLD: 14 % (ref 0–74)
MONOCYTE/MACROPHAGE: 3 % (ref 0–69)
NEUTROPHIL, SYNOVIAL: 83 % — AB (ref 0–24)
Synoviocytes, %: 0 % (ref 0–15)
WBC, Synovial: 18420 cells/uL — ABNORMAL HIGH (ref <150)

## 2016-04-03 LAB — C-REACTIVE PROTEIN: CRP: 11.3 mg/L — AB (ref <8.0)

## 2016-04-07 LAB — BODY FLUID CULTURE
Gram Stain: NONE SEEN
Organism ID, Bacteria: NO GROWTH

## 2016-04-09 ENCOUNTER — Telehealth (INDEPENDENT_AMBULATORY_CARE_PROVIDER_SITE_OTHER): Payer: Self-pay | Admitting: Orthopedic Surgery

## 2016-04-09 NOTE — Telephone Encounter (Signed)
Patient request a call back with test results

## 2016-04-10 NOTE — Telephone Encounter (Signed)
IC patient and advised per Dr Randel Pigg note on reports- LMVM

## 2016-05-23 ENCOUNTER — Encounter (INDEPENDENT_AMBULATORY_CARE_PROVIDER_SITE_OTHER): Payer: Medicare Other | Admitting: Ophthalmology

## 2016-05-23 DIAGNOSIS — H353211 Exudative age-related macular degeneration, right eye, with active choroidal neovascularization: Secondary | ICD-10-CM | POA: Diagnosis not present

## 2016-05-23 DIAGNOSIS — H35033 Hypertensive retinopathy, bilateral: Secondary | ICD-10-CM | POA: Diagnosis not present

## 2016-05-23 DIAGNOSIS — H43813 Vitreous degeneration, bilateral: Secondary | ICD-10-CM

## 2016-05-23 DIAGNOSIS — I10 Essential (primary) hypertension: Secondary | ICD-10-CM | POA: Diagnosis not present

## 2016-05-23 DIAGNOSIS — H353122 Nonexudative age-related macular degeneration, left eye, intermediate dry stage: Secondary | ICD-10-CM

## 2016-05-23 DIAGNOSIS — H2513 Age-related nuclear cataract, bilateral: Secondary | ICD-10-CM

## 2016-08-01 ENCOUNTER — Encounter (INDEPENDENT_AMBULATORY_CARE_PROVIDER_SITE_OTHER): Payer: Medicare Other | Admitting: Ophthalmology

## 2016-08-01 DIAGNOSIS — H35033 Hypertensive retinopathy, bilateral: Secondary | ICD-10-CM

## 2016-08-01 DIAGNOSIS — H353211 Exudative age-related macular degeneration, right eye, with active choroidal neovascularization: Secondary | ICD-10-CM | POA: Diagnosis not present

## 2016-08-01 DIAGNOSIS — H43813 Vitreous degeneration, bilateral: Secondary | ICD-10-CM

## 2016-08-01 DIAGNOSIS — H2513 Age-related nuclear cataract, bilateral: Secondary | ICD-10-CM | POA: Diagnosis not present

## 2016-08-01 DIAGNOSIS — H353122 Nonexudative age-related macular degeneration, left eye, intermediate dry stage: Secondary | ICD-10-CM | POA: Diagnosis not present

## 2016-08-01 DIAGNOSIS — I10 Essential (primary) hypertension: Secondary | ICD-10-CM | POA: Diagnosis not present

## 2016-10-04 ENCOUNTER — Ambulatory Visit (INDEPENDENT_AMBULATORY_CARE_PROVIDER_SITE_OTHER): Payer: Medicare Other | Admitting: Orthopedic Surgery

## 2016-10-15 DIAGNOSIS — I10 Essential (primary) hypertension: Secondary | ICD-10-CM | POA: Diagnosis not present

## 2016-10-15 DIAGNOSIS — R7303 Prediabetes: Secondary | ICD-10-CM | POA: Diagnosis not present

## 2016-10-18 ENCOUNTER — Encounter (INDEPENDENT_AMBULATORY_CARE_PROVIDER_SITE_OTHER): Payer: Self-pay | Admitting: Orthopedic Surgery

## 2016-10-18 ENCOUNTER — Ambulatory Visit (INDEPENDENT_AMBULATORY_CARE_PROVIDER_SITE_OTHER): Payer: Medicare Other | Admitting: Orthopedic Surgery

## 2016-10-18 ENCOUNTER — Ambulatory Visit (INDEPENDENT_AMBULATORY_CARE_PROVIDER_SITE_OTHER): Payer: Medicare Other

## 2016-10-18 DIAGNOSIS — G8929 Other chronic pain: Secondary | ICD-10-CM

## 2016-10-18 DIAGNOSIS — M25561 Pain in right knee: Secondary | ICD-10-CM

## 2016-10-18 MED ORDER — DIAZEPAM 5 MG PO TABS: ORAL_TABLET | ORAL | 0 refills | Status: DC

## 2016-10-19 LAB — SYNOVIAL CELL COUNT + DIFF, W/ CRYSTALS
Basophils, %: 0 %
Eosinophils-Synovial: 2 % (ref 0–2)
Lymphocytes-Synovial Fld: 10 % (ref 0–74)
Monocyte/Macrophage: 5 % (ref 0–69)
Neutrophil, Synovial: 83 % — ABNORMAL HIGH (ref 0–24)
SYNOVIOCYTES, %: 0 % (ref 0–15)
WBC, SYNOVIAL: 7065 {cells}/uL — AB (ref <150)

## 2016-10-21 DIAGNOSIS — M25561 Pain in right knee: Secondary | ICD-10-CM

## 2016-10-21 DIAGNOSIS — G8929 Other chronic pain: Secondary | ICD-10-CM | POA: Diagnosis not present

## 2016-10-21 MED ORDER — LIDOCAINE HCL 1 % IJ SOLN
5.0000 mL | INTRAMUSCULAR | Status: AC | PRN
  Administered 2016-10-21: 5 mL

## 2016-10-21 NOTE — Progress Notes (Signed)
Office Visit Note   Patient: Amy Carroll           Date of Birth: [birth year]           MRN: [id number] Visit Date: 10/18/2016 Requested by: Lucilla Lame, [place], [place] [address] PCP: Lucilla Lame, MD  Subjective: Chief Complaint  Patient presents with  . Right Knee - Follow-up    HPI: Amy Carroll is a 82 year old patient with right knee pain.  She reports recurrent swelling in the knee but no pain.  States the knee feels warm to touch but she has not had any fevers.  She denies any weakness giving way popping or locking.  Does have history of right total knee replacement done 4 years ago.  Again she denies any pain symptoms.              ROS: All systems reviewed are negative as they relate to the chief complaint within the history of present illness.  Patient denies  fevers or chills.   Assessment & Plan: Visit Diagnoses:  1. Chronic pain of right knee     Plan: Impression is right knee effusion.  Aspiration performed today.  At the time of this dictation cultures are negative.  The fluid itself was somewhat bloody.  I will write her prescription for Valium to take for sleep as needed.  I'll see her back in 6 months for clinical recheck.  Follow-Up Instructions: No Follow-up on file.   Orders:  Orders Placed This Encounter  Procedures  . Body Fluid Culture  . XR KNEE 3 VIEW RIGHT  . Cell count + diff,  w/ cryst-synvl fld   Meds ordered this encounter  Medications  . diazepam (VALIUM) 5 MG tablet    Sig: 1 po qhs prn    Dispense:  35 tablet    Refill:  0      Procedures: Large Joint Inj Date/Time: 10/21/2016 8:53 PM Performed by: Meredith Pel Authorized by: Meredith Pel   Consent Given by:  Patient Site marked: the procedure site was marked   Timeout: prior to procedure the correct patient, procedure, and site was verified   Indications:  Pain, joint swelling and diagnostic  evaluation Location:  Knee Site:  R knee Prep: patient was prepped and draped in usual sterile fashion   Needle Size:  18 G Needle Length:  1.5 inches Approach:  Superolateral Ultrasound Guidance: No   Fluoroscopic Guidance: No   Arthrogram: No   Medications:  5 mL lidocaine 1 % Aspiration Attempted: Yes   Aspirate amount (mL):  45 Aspirate:  Blood-tinged Lab: fluid sent for laboratory analysis   Patient tolerance:  Patient tolerated the procedure well with no immediate complications     Clinical Data: No additional findings.  Objective: Vital Signs: There were no vitals taken for this visit.  Physical Exam:   Constitutional: Patient appears well-developed HEENT:  Head: Normocephalic Eyes:EOM are normal Neck: Normal range of motion Cardiovascular: Normal rate Pulmonary/chest: Effort normal Neurologic: Patient is alert Skin: Skin is warm Psychiatric: Patient has normal mood and affect    Ortho Exam: Orthopedic exam demonstrates moderate right knee effusion with no lymphadenopathy range of motion is excellent extensor mechanism is intact pedal pulses palpable patella mobility is excellent.  No masses lymph adenopathy or skin changes noted in the right knee region  Specialty Comments:  No specialty comments available.  Imaging: No results found.   PMFS History: Patient Active Problem  List   Diagnosis Date Noted  . Presence of right artificial knee joint 04/02/2016  . Effusion, right knee 04/02/2016  . Osteoarthritis of right knee 05/27/2012    Class: Diagnosis of   Past Medical History:  Diagnosis Date  . Arthritis   . Hypertension     Family History  Problem Relation Age of Onset  . Cancer Mother   . Heart disease Father   . Cancer Brother     Past Surgical History:  Procedure Laterality Date  . ABDOMINAL HYSTERECTOMY    . JOINT REPLACEMENT    . TOTAL KNEE ARTHROPLASTY Right 05/27/2012   Procedure: TOTAL KNEE ARTHROPLASTY;  Surgeon: Meredith Pel, MD;  Location: [facility];  Service: Orthopedics;  Laterality: Right;  Right Total Knee Arthroplasty  . TUBAL LIGATION     Social History   Occupational History  . Not on file.   Social History Main Topics  . Smoking status: Former Research scientist (life sciences)  . Smokeless tobacco: Never Used  . Alcohol use 0.6 oz/week    1 Glasses of wine per week  . Drug use: No  . Sexual activity: Not on file

## 2016-10-22 DIAGNOSIS — I10 Essential (primary) hypertension: Secondary | ICD-10-CM | POA: Diagnosis not present

## 2016-10-22 DIAGNOSIS — R5383 Other fatigue: Secondary | ICD-10-CM | POA: Diagnosis not present

## 2016-10-22 DIAGNOSIS — M8580 Other specified disorders of bone density and structure, unspecified site: Secondary | ICD-10-CM | POA: Diagnosis not present

## 2016-10-22 DIAGNOSIS — R7303 Prediabetes: Secondary | ICD-10-CM | POA: Diagnosis not present

## 2016-10-23 LAB — BODY FLUID CULTURE
Gram Stain: NONE SEEN
Organism ID, Bacteria: NO GROWTH

## 2016-10-24 ENCOUNTER — Encounter (INDEPENDENT_AMBULATORY_CARE_PROVIDER_SITE_OTHER): Payer: Medicare Other | Admitting: Ophthalmology

## 2016-10-24 DIAGNOSIS — H353122 Nonexudative age-related macular degeneration, left eye, intermediate dry stage: Secondary | ICD-10-CM | POA: Diagnosis not present

## 2016-10-24 DIAGNOSIS — H43813 Vitreous degeneration, bilateral: Secondary | ICD-10-CM | POA: Diagnosis not present

## 2016-10-24 DIAGNOSIS — H35033 Hypertensive retinopathy, bilateral: Secondary | ICD-10-CM

## 2016-10-24 DIAGNOSIS — H2513 Age-related nuclear cataract, bilateral: Secondary | ICD-10-CM | POA: Diagnosis not present

## 2016-10-24 DIAGNOSIS — I10 Essential (primary) hypertension: Secondary | ICD-10-CM | POA: Diagnosis not present

## 2016-10-24 DIAGNOSIS — H353211 Exudative age-related macular degeneration, right eye, with active choroidal neovascularization: Secondary | ICD-10-CM | POA: Diagnosis not present

## 2016-10-24 NOTE — Progress Notes (Signed)
Pls call labs neg for infection - still unclear source of swelling - need f/u 6 mos

## 2016-10-31 DIAGNOSIS — H2513 Age-related nuclear cataract, bilateral: Secondary | ICD-10-CM | POA: Diagnosis not present

## 2016-10-31 DIAGNOSIS — H353111 Nonexudative age-related macular degeneration, right eye, early dry stage: Secondary | ICD-10-CM | POA: Diagnosis not present

## 2016-10-31 DIAGNOSIS — H40013 Open angle with borderline findings, low risk, bilateral: Secondary | ICD-10-CM | POA: Diagnosis not present

## 2016-10-31 DIAGNOSIS — H353221 Exudative age-related macular degeneration, left eye, with active choroidal neovascularization: Secondary | ICD-10-CM | POA: Diagnosis not present

## 2016-12-27 DIAGNOSIS — Z1231 Encounter for screening mammogram for malignant neoplasm of breast: Secondary | ICD-10-CM | POA: Diagnosis not present

## 2017-01-23 ENCOUNTER — Encounter (INDEPENDENT_AMBULATORY_CARE_PROVIDER_SITE_OTHER): Payer: Medicare Other | Admitting: Ophthalmology

## 2017-01-23 DIAGNOSIS — H35033 Hypertensive retinopathy, bilateral: Secondary | ICD-10-CM | POA: Diagnosis not present

## 2017-01-23 DIAGNOSIS — H43813 Vitreous degeneration, bilateral: Secondary | ICD-10-CM

## 2017-01-23 DIAGNOSIS — I10 Essential (primary) hypertension: Secondary | ICD-10-CM

## 2017-01-23 DIAGNOSIS — H353122 Nonexudative age-related macular degeneration, left eye, intermediate dry stage: Secondary | ICD-10-CM

## 2017-01-23 DIAGNOSIS — H2513 Age-related nuclear cataract, bilateral: Secondary | ICD-10-CM

## 2017-01-23 DIAGNOSIS — H353211 Exudative age-related macular degeneration, right eye, with active choroidal neovascularization: Secondary | ICD-10-CM

## 2017-01-30 DIAGNOSIS — H2513 Age-related nuclear cataract, bilateral: Secondary | ICD-10-CM | POA: Diagnosis not present

## 2017-01-30 DIAGNOSIS — H353111 Nonexudative age-related macular degeneration, right eye, early dry stage: Secondary | ICD-10-CM | POA: Diagnosis not present

## 2017-01-30 DIAGNOSIS — H353221 Exudative age-related macular degeneration, left eye, with active choroidal neovascularization: Secondary | ICD-10-CM | POA: Diagnosis not present

## 2017-01-30 DIAGNOSIS — H40013 Open angle with borderline findings, low risk, bilateral: Secondary | ICD-10-CM | POA: Diagnosis not present

## 2017-02-08 DIAGNOSIS — N390 Urinary tract infection, site not specified: Secondary | ICD-10-CM | POA: Diagnosis not present

## 2017-02-08 DIAGNOSIS — I10 Essential (primary) hypertension: Secondary | ICD-10-CM | POA: Diagnosis not present

## 2017-02-08 DIAGNOSIS — R7303 Prediabetes: Secondary | ICD-10-CM | POA: Diagnosis not present

## 2017-02-08 DIAGNOSIS — R5383 Other fatigue: Secondary | ICD-10-CM | POA: Diagnosis not present

## 2017-02-11 DIAGNOSIS — I10 Essential (primary) hypertension: Secondary | ICD-10-CM | POA: Diagnosis not present

## 2017-02-11 DIAGNOSIS — M8580 Other specified disorders of bone density and structure, unspecified site: Secondary | ICD-10-CM | POA: Diagnosis not present

## 2017-02-11 DIAGNOSIS — R5383 Other fatigue: Secondary | ICD-10-CM | POA: Diagnosis not present

## 2017-02-11 DIAGNOSIS — Z0000 Encounter for general adult medical examination without abnormal findings: Secondary | ICD-10-CM | POA: Diagnosis not present

## 2017-02-11 DIAGNOSIS — R7303 Prediabetes: Secondary | ICD-10-CM | POA: Diagnosis not present

## 2017-02-11 DIAGNOSIS — Z23 Encounter for immunization: Secondary | ICD-10-CM | POA: Diagnosis not present

## 2017-02-13 DIAGNOSIS — H2513 Age-related nuclear cataract, bilateral: Secondary | ICD-10-CM | POA: Diagnosis not present

## 2017-02-13 DIAGNOSIS — H40013 Open angle with borderline findings, low risk, bilateral: Secondary | ICD-10-CM | POA: Diagnosis not present

## 2017-02-13 DIAGNOSIS — H353211 Exudative age-related macular degeneration, right eye, with active choroidal neovascularization: Secondary | ICD-10-CM | POA: Diagnosis not present

## 2017-02-13 DIAGNOSIS — H353121 Nonexudative age-related macular degeneration, left eye, early dry stage: Secondary | ICD-10-CM | POA: Diagnosis not present

## 2017-04-22 ENCOUNTER — Ambulatory Visit (INDEPENDENT_AMBULATORY_CARE_PROVIDER_SITE_OTHER): Payer: Medicare Other | Admitting: Orthopedic Surgery

## 2017-04-22 ENCOUNTER — Encounter (INDEPENDENT_AMBULATORY_CARE_PROVIDER_SITE_OTHER): Payer: Self-pay | Admitting: Orthopedic Surgery

## 2017-04-22 DIAGNOSIS — M25461 Effusion, right knee: Secondary | ICD-10-CM

## 2017-04-23 ENCOUNTER — Encounter (INDEPENDENT_AMBULATORY_CARE_PROVIDER_SITE_OTHER): Payer: Self-pay | Admitting: Orthopedic Surgery

## 2017-04-23 LAB — SYNOVIAL CELL COUNT + DIFF, W/ CRYSTALS
Basophils, %: 0 %
EOSINOPHILS-SYNOVIAL: 0 % (ref 0–2)
Lymphocytes-Synovial Fld: 15 % (ref 0–74)
MONOCYTE/MACROPHAGE: 6 % (ref 0–69)
Neutrophil, Synovial: 79 % — ABNORMAL HIGH (ref 0–24)
Synoviocytes, %: 0 % (ref 0–15)
WBC, Synovial: 14585 cells/uL — ABNORMAL HIGH (ref <150)

## 2017-04-23 NOTE — Progress Notes (Signed)
Office Visit Note   Patient: Amy Carroll           Date of Birth: Feb 10, 1935           MRN: [id number] Visit Date: 04/22/2017 Requested by: Amy Carroll, [place] [place], [place] [phone] PCP: Amy Lame, MD  Subjective: Chief Complaint  Patient presents with  . Right Knee - Follow-up    HPI: Amy Carroll is a patient who underwent right total knee replacement in April 2014.  She feels good.  Has no issues with the right knee.  Does not have any fevers or chills.  She has had persistent right knee effusion.  Workup to date has been negative.  He has had slightly elevated white count in the fluid but never any growth in culture she is living at [facility].              ROS: All systems reviewed are negative as they relate to the chief complaint within the history of present illness.  Patient denies  fevers or chills.   Assessment & Plan: Visit Diagnoses:  1. Effusion, right knee     Plan: Impression is right knee effusion following total knee arthroplasty.  Range of motion is excellent.  Aspiration of the knee joint is performed today after sterile prepping and draping.  At about 40 cc out which at the time of this dictation shows a white count in the fluid of 14,000.  Cultures are pending.  Would not favor any intervention at this time unless cultures are positive.  Come back in 6 months.  Follow-Up Instructions: Return in about 6 months (around 10/20/2017).   Orders:  Orders Placed This Encounter  Procedures  . Anaerobic and Aerobic Culture  . Cell count + diff,  w/ cryst-synvl fld   No orders of the defined types were placed in this encounter.     Procedures: No procedures performed   Clinical Data: No additional findings.  Objective: Vital Signs: There were no vitals taken for this visit.  Physical Exam:   Constitutional: Patient appears well-developed HEENT:  Head: Normocephalic Eyes:EOM are normal Neck:  Normal range of motion Cardiovascular: Normal rate Pulmonary/chest: Effort normal Neurologic: Patient is alert Skin: Skin is warm Psychiatric: Patient has normal mood and affect    Ortho Exam: Orthopedic exam demonstrates mild right knee effusion.  Collateral and cruciate ligaments are stable.  Range of motion is excellent with flexion easily to 100 degrees or more.  No proximal lymphadenopathy.  No other masses lymphadenopathy or skin changes noted in the right knee region  Specialty Comments:  No specialty comments available.  Imaging: No results found.   PMFS History: Patient Active Problem List   Diagnosis Date Noted  . Presence of right artificial knee joint 04/02/2016  . Effusion, right knee 04/02/2016  . Osteoarthritis of right knee 05/27/2012    Class: Diagnosis of   Past Medical History:  Diagnosis Date  . Arthritis   . Hypertension     Family History  Problem Relation Age of Onset  . Cancer Mother   . Heart disease Father   . Cancer Brother     Past Surgical History:  Procedure Laterality Date  . ABDOMINAL HYSTERECTOMY    . JOINT REPLACEMENT    . TOTAL KNEE ARTHROPLASTY Right 05/27/2012   Procedure: TOTAL KNEE ARTHROPLASTY;  Surgeon: Meredith Pel, MD;  Location: [facility];  Service: Orthopedics;  Laterality: Right;  Right Total Knee Arthroplasty  .  TUBAL LIGATION     Social History   Occupational History  . Not on file  Tobacco Use  . Smoking status: Former Research scientist (life sciences)  . Smokeless tobacco: Never Used  Substance and Sexual Activity  . Alcohol use: Yes    Alcohol/week: 0.6 oz    Types: 1 Glasses of wine per week  . Drug use: No  . Sexual activity: Not on file

## 2017-04-24 ENCOUNTER — Encounter (INDEPENDENT_AMBULATORY_CARE_PROVIDER_SITE_OTHER): Payer: Medicare Other | Admitting: Ophthalmology

## 2017-04-24 DIAGNOSIS — I10 Essential (primary) hypertension: Secondary | ICD-10-CM | POA: Diagnosis not present

## 2017-04-24 DIAGNOSIS — H43813 Vitreous degeneration, bilateral: Secondary | ICD-10-CM | POA: Diagnosis not present

## 2017-04-24 DIAGNOSIS — H353122 Nonexudative age-related macular degeneration, left eye, intermediate dry stage: Secondary | ICD-10-CM | POA: Diagnosis not present

## 2017-04-24 DIAGNOSIS — H2513 Age-related nuclear cataract, bilateral: Secondary | ICD-10-CM | POA: Diagnosis not present

## 2017-04-24 DIAGNOSIS — H353211 Exudative age-related macular degeneration, right eye, with active choroidal neovascularization: Secondary | ICD-10-CM | POA: Diagnosis not present

## 2017-04-24 DIAGNOSIS — H35033 Hypertensive retinopathy, bilateral: Secondary | ICD-10-CM

## 2017-04-25 NOTE — Progress Notes (Signed)
Please call patient with results. Thanks - no growth in culture

## 2017-04-28 LAB — ANAEROBIC AND AEROBIC CULTURE
AER RESULT:: NO GROWTH
MICRO NUMBER: 90243597
MICRO NUMBER: 90243598
SPECIMEN QUALITY: ADEQUATE
SPECIMEN QUALITY: ADEQUATE

## 2017-06-18 DIAGNOSIS — H353211 Exudative age-related macular degeneration, right eye, with active choroidal neovascularization: Secondary | ICD-10-CM | POA: Diagnosis not present

## 2017-06-18 DIAGNOSIS — H2513 Age-related nuclear cataract, bilateral: Secondary | ICD-10-CM | POA: Diagnosis not present

## 2017-06-18 DIAGNOSIS — H40013 Open angle with borderline findings, low risk, bilateral: Secondary | ICD-10-CM | POA: Diagnosis not present

## 2017-06-18 DIAGNOSIS — H353121 Nonexudative age-related macular degeneration, left eye, early dry stage: Secondary | ICD-10-CM | POA: Diagnosis not present

## 2017-07-04 ENCOUNTER — Encounter (INDEPENDENT_AMBULATORY_CARE_PROVIDER_SITE_OTHER): Payer: Medicare Other | Admitting: Ophthalmology

## 2017-07-04 DIAGNOSIS — H353122 Nonexudative age-related macular degeneration, left eye, intermediate dry stage: Secondary | ICD-10-CM

## 2017-07-04 DIAGNOSIS — H43813 Vitreous degeneration, bilateral: Secondary | ICD-10-CM

## 2017-07-04 DIAGNOSIS — H35033 Hypertensive retinopathy, bilateral: Secondary | ICD-10-CM | POA: Diagnosis not present

## 2017-07-04 DIAGNOSIS — H353211 Exudative age-related macular degeneration, right eye, with active choroidal neovascularization: Secondary | ICD-10-CM | POA: Diagnosis not present

## 2017-07-04 DIAGNOSIS — H2513 Age-related nuclear cataract, bilateral: Secondary | ICD-10-CM | POA: Diagnosis not present

## 2017-07-04 DIAGNOSIS — I10 Essential (primary) hypertension: Secondary | ICD-10-CM | POA: Diagnosis not present

## 2017-07-30 DIAGNOSIS — H353211 Exudative age-related macular degeneration, right eye, with active choroidal neovascularization: Secondary | ICD-10-CM | POA: Diagnosis not present

## 2017-07-30 DIAGNOSIS — H40013 Open angle with borderline findings, low risk, bilateral: Secondary | ICD-10-CM | POA: Diagnosis not present

## 2017-07-30 DIAGNOSIS — H2513 Age-related nuclear cataract, bilateral: Secondary | ICD-10-CM | POA: Diagnosis not present

## 2017-07-30 DIAGNOSIS — H353121 Nonexudative age-related macular degeneration, left eye, early dry stage: Secondary | ICD-10-CM | POA: Diagnosis not present

## 2017-08-12 DIAGNOSIS — M8580 Other specified disorders of bone density and structure, unspecified site: Secondary | ICD-10-CM | POA: Diagnosis not present

## 2017-08-12 DIAGNOSIS — I10 Essential (primary) hypertension: Secondary | ICD-10-CM | POA: Diagnosis not present

## 2017-08-12 DIAGNOSIS — R7303 Prediabetes: Secondary | ICD-10-CM | POA: Diagnosis not present

## 2017-08-19 DIAGNOSIS — M8580 Other specified disorders of bone density and structure, unspecified site: Secondary | ICD-10-CM | POA: Diagnosis not present

## 2017-08-19 DIAGNOSIS — M5431 Sciatica, right side: Secondary | ICD-10-CM | POA: Diagnosis not present

## 2017-08-19 DIAGNOSIS — R7303 Prediabetes: Secondary | ICD-10-CM | POA: Diagnosis not present

## 2017-08-19 DIAGNOSIS — I10 Essential (primary) hypertension: Secondary | ICD-10-CM | POA: Diagnosis not present

## 2017-08-28 DIAGNOSIS — M5431 Sciatica, right side: Secondary | ICD-10-CM | POA: Diagnosis not present

## 2017-09-19 ENCOUNTER — Encounter (INDEPENDENT_AMBULATORY_CARE_PROVIDER_SITE_OTHER): Payer: Medicare Other | Admitting: Ophthalmology

## 2017-09-19 DIAGNOSIS — H353211 Exudative age-related macular degeneration, right eye, with active choroidal neovascularization: Secondary | ICD-10-CM

## 2017-09-19 DIAGNOSIS — I10 Essential (primary) hypertension: Secondary | ICD-10-CM

## 2017-09-19 DIAGNOSIS — H2513 Age-related nuclear cataract, bilateral: Secondary | ICD-10-CM | POA: Diagnosis not present

## 2017-09-19 DIAGNOSIS — H353122 Nonexudative age-related macular degeneration, left eye, intermediate dry stage: Secondary | ICD-10-CM | POA: Diagnosis not present

## 2017-09-19 DIAGNOSIS — H43813 Vitreous degeneration, bilateral: Secondary | ICD-10-CM

## 2017-09-19 DIAGNOSIS — H35033 Hypertensive retinopathy, bilateral: Secondary | ICD-10-CM | POA: Diagnosis not present

## 2017-09-20 ENCOUNTER — Telehealth (INDEPENDENT_AMBULATORY_CARE_PROVIDER_SITE_OTHER): Payer: Self-pay | Admitting: Orthopedic Surgery

## 2017-09-20 NOTE — Telephone Encounter (Signed)
mailed

## 2017-09-20 NOTE — Telephone Encounter (Signed)
y

## 2017-09-20 NOTE — Telephone Encounter (Signed)
Pt request please mail  Handicap Placard

## 2017-09-20 NOTE — Telephone Encounter (Signed)
[place] for placard? Patient not seen since 03/2017

## 2017-10-21 ENCOUNTER — Encounter (INDEPENDENT_AMBULATORY_CARE_PROVIDER_SITE_OTHER): Payer: Self-pay | Admitting: Orthopedic Surgery

## 2017-10-21 ENCOUNTER — Ambulatory Visit (INDEPENDENT_AMBULATORY_CARE_PROVIDER_SITE_OTHER): Payer: Medicare Other

## 2017-10-21 ENCOUNTER — Ambulatory Visit (INDEPENDENT_AMBULATORY_CARE_PROVIDER_SITE_OTHER): Payer: Self-pay

## 2017-10-21 ENCOUNTER — Ambulatory Visit (INDEPENDENT_AMBULATORY_CARE_PROVIDER_SITE_OTHER): Payer: Medicare Other | Admitting: Orthopedic Surgery

## 2017-10-21 DIAGNOSIS — Z96651 Presence of right artificial knee joint: Secondary | ICD-10-CM | POA: Diagnosis not present

## 2017-10-21 DIAGNOSIS — M5410 Radiculopathy, site unspecified: Secondary | ICD-10-CM

## 2017-10-21 DIAGNOSIS — M5441 Lumbago with sciatica, right side: Secondary | ICD-10-CM | POA: Diagnosis not present

## 2017-10-21 NOTE — Progress Notes (Signed)
Office Visit Note   Patient: Amy Carroll           Date of Birth: 05-17-1934           MRN: [id number] Visit Date: 10/21/2017 Requested by: Lucilla Lame, [address] [place] [address], [place] [phone] PCP: Lucilla Lame, MD  Subjective: Chief Complaint  Patient presents with  . Right Knee - Follow-up    HPI: Amy Carroll is an 83 year old patient with right leg pain knee pain and back pain.  In general she is been taking Celebrex for a month because of what sounds like radiating sciatica on the right-hand side.  She does state that the pain is primarily in the mid thigh anteriorly but it does radiate down into the leg.  Denies any numbness and tingling.  She is had persistent effusion in both knees but this is been worked up and is negative for infection or loosening.  She currently is not very symptomatic with the knee but does have some pain which wakes her up in the early morning hours.  Denies any fevers or chills.              ROS: All systems reviewed are negative as they relate to the chief complaint within the history of present illness.  Patient denies  fevers or chills.   Assessment & Plan: Visit Diagnoses:  1. Presence of right artificial knee joint     Plan: Impression is recurrent effusion in the right knee with no evidence of infection or loosening.  Not having much pain in that right knee.  Radiographs of the spine are pretty unremarkable but she is having what sounds like right-sided sciatica.  I think MRI scanning in a month would be the next step.  I demonstrated to her different exercises to do for hamstring stretching and strengthening.  Water walking would be good.  If she is not better in a month then I think we should scan the back.  Still unclear why she has recurrent effusions in the knee but we have ruled out infection and loosening in the past.  I will see her back in about 6 months for clinical recheck or sooner if she needs MRI  scanning on her back.  Follow-Up Instructions: No follow-ups on file.   Orders:  Orders Placed This Encounter  Procedures  . XR Knee 1-2 Views Right  . XR Knee 1-2 Views Left   No orders of the defined types were placed in this encounter.     Procedures: No procedures performed   Clinical Data: No additional findings.  Objective: Vital Signs: There were no vitals taken for this visit.  Physical Exam:   Constitutional: Patient appears well-developed HEENT:  Head: Normocephalic Eyes:EOM are normal Neck: Normal range of motion Cardiovascular: Normal rate Pulmonary/chest: Effort normal Neurologic: Patient is alert Skin: Skin is warm Psychiatric: Patient has normal mood and affect    Ortho Exam: Ortho exam demonstrates no warmth in either knee but there is a moderate effusion in the right knee compared to no effusion in the left knee.  Not much in the way of groin pain with internal/external rotation of either leg.  Pedal pulses palpable.  No other masses lymph adenopathy or skin changes noted in the knee regions.  Extensor mechanism is intact on the right.  Specialty Comments:  No specialty comments available.  Imaging: Xr Knee 1-2 Views Right  Result Date: 10/21/2017 AP lateral right knee reviewed.  Posterior cruciate sacrificing  total knee replacement cemented in good position and alignment with no change compared to radiographs from 1 year ago.  No evidence of bony erosion or ostial lysis pockets.    PMFS History: Patient Active Problem List   Diagnosis Date Noted  . Presence of right artificial knee joint 04/02/2016  . Effusion, right knee 04/02/2016  . Osteoarthritis of right knee 05/27/2012    Class: Diagnosis of   Past Medical History:  Diagnosis Date  . Arthritis   . Hypertension     Family History  Problem Relation Age of Onset  . Cancer Mother   . Heart disease Father   . Cancer Brother     Past Surgical History:  Procedure Laterality Date    . ABDOMINAL HYSTERECTOMY    . JOINT REPLACEMENT    . TOTAL KNEE ARTHROPLASTY Right 05/27/2012   Procedure: TOTAL KNEE ARTHROPLASTY;  Surgeon: Meredith Pel, MD;  Location: [facility];  Service: Orthopedics;  Laterality: Right;  Right Total Knee Arthroplasty  . TUBAL LIGATION     Social History   Occupational History  . Not on file  Tobacco Use  . Smoking status: Former Research scientist (life sciences)  . Smokeless tobacco: Never Used  Substance and Sexual Activity  . Alcohol use: Yes    Alcohol/week: 1.0 standard drinks    Types: 1 Glasses of wine per week  . Drug use: No  . Sexual activity: Not on file

## 2017-11-20 ENCOUNTER — Ambulatory Visit (INDEPENDENT_AMBULATORY_CARE_PROVIDER_SITE_OTHER): Payer: Medicare Other | Admitting: Orthopedic Surgery

## 2017-11-20 ENCOUNTER — Encounter (INDEPENDENT_AMBULATORY_CARE_PROVIDER_SITE_OTHER): Payer: Self-pay | Admitting: Orthopedic Surgery

## 2017-11-20 DIAGNOSIS — M5410 Radiculopathy, site unspecified: Secondary | ICD-10-CM

## 2017-11-20 NOTE — Progress Notes (Signed)
Office Visit Note   Patient: Amy Carroll           Date of Birth: 05-04-1934           MRN: [id number] Visit Date: 11/20/2017 Requested by: Lucilla Lame, [address], [place] [address] PCP: Lucilla Lame, MD  Subjective: Chief Complaint  Patient presents with  . Lower Back - Pain    HPI: Patient presents for follow-up evaluation of right leg and back pain.*Month ago and we were going to follow this along.  She reports night pain and pain that wakes her from sleep.  She had right total knee replacement done in the past.  Patient states that the pain starts in her low back radiates around the buttock and down into the leg.  All of her symptoms are on the right side.  She still reports a lot of leg pain which occasionally radiates below the knee but stays above the knee most of the time.              ROS: All systems reviewed are negative as they relate to the chief complaint within the history of present illness.  Patient denies  fevers or chills.   Assessment & Plan: Visit Diagnoses: No diagnosis found.  Plan: Impression is radicular right leg pain long duration with recurrent effusion in the right knee.  We worked up this effusion in the past and nothing clearly definitively can be determined.  The components do not appear loose and does not appear to be infected.  Plan at this time is to get MRI scan of her lumbar spine to evaluate for right-sided sciatica.  I will see her back after that study.  Follow-Up Instructions: No follow-ups on file.   Orders:  No orders of the defined types were placed in this encounter.  No orders of the defined types were placed in this encounter.     Procedures: No procedures performed   Clinical Data: No additional findings.  Objective: Vital Signs: There were no vitals taken for this visit.  Physical Exam:   Constitutional: Patient appears well-developed HEENT:  Head:  Normocephalic Eyes:EOM are normal Neck: Normal range of motion Cardiovascular: Normal rate Pulmonary/chest: Effort normal Neurologic: Patient is alert Skin: Skin is warm Psychiatric: Patient has normal mood and affect    Ortho Exam: Ortho exam demonstrates no groin pain with internal extra rotation of the leg.  Pedal pulses palpable.  There is some moderate effusion in that right knee.  Extensor mechanism is intact.  No real warmth to the knee.  Equivocal nerve root tension signs on the right negative on the left.  No definite paresthesias L1 S1 bilaterally.  Specialty Comments:  No specialty comments available.  Imaging: No results found.   PMFS History: Patient Active Problem List   Diagnosis Date Noted  . Presence of right artificial knee joint 04/02/2016  . Effusion, right knee 04/02/2016  . Osteoarthritis of right knee 05/27/2012    Class: Diagnosis of   Past Medical History:  Diagnosis Date  . Arthritis   . Hypertension     Family History  Problem Relation Age of Onset  . Cancer Mother   . Heart disease Father   . Cancer Brother     Past Surgical History:  Procedure Laterality Date  . ABDOMINAL HYSTERECTOMY    . JOINT REPLACEMENT    . TOTAL KNEE ARTHROPLASTY Right 05/27/2012   Procedure: TOTAL KNEE ARTHROPLASTY;  Surgeon: Tonna Corner  Marlou Sa, MD;  Location: [facility];  Service: Orthopedics;  Laterality: Right;  Right Total Knee Arthroplasty  . TUBAL LIGATION     Social History   Occupational History  . Not on file  Tobacco Use  . Smoking status: Former Research scientist (life sciences)  . Smokeless tobacco: Never Used  Substance and Sexual Activity  . Alcohol use: Yes    Alcohol/week: 1.0 standard drinks    Types: 1 Glasses of wine per week  . Drug use: No  . Sexual activity: Not on file

## 2017-11-21 ENCOUNTER — Telehealth (INDEPENDENT_AMBULATORY_CARE_PROVIDER_SITE_OTHER): Payer: Self-pay | Admitting: Orthopedic Surgery

## 2017-11-21 ENCOUNTER — Encounter (INDEPENDENT_AMBULATORY_CARE_PROVIDER_SITE_OTHER): Payer: Self-pay | Admitting: Orthopedic Surgery

## 2017-11-21 NOTE — Telephone Encounter (Signed)
Patient has MRI of back set up for 9/29. She said that at her last appt Dr. Marlou Sa also wanted her to have an MRI of her left leg since she has still been having problems with it so long. Can this be added to the order? She said if she needs to r/s to later time that would be fine. # [phone]

## 2017-11-21 NOTE — Telephone Encounter (Signed)
Can you please advise on this? I do not have your dictation to look back at. It seems like it would just be of her back. I think that you thought the radicular leg pain was referred from back. Please advise/clarify so I can call her. Thanks.

## 2017-11-21 NOTE — Telephone Encounter (Signed)
You I would just go with the back for now.  MRI scan on that knee is not, give [place] much information other than she has a big effusion.  I think the plan was to look at the back first.  We did call her back to make sure that she is talking about the right leg and on the left leg because it is the right knee that is giving her trouble.  In the right leg it is giving her trouble.

## 2017-11-21 NOTE — Telephone Encounter (Signed)
FYI I talked with her and advised what you suggested. I did confirm with her, she was referring to the right knee/leg. She said that she had a history of right knee surgery-no pain pain in the knee at all just radicular right leg pain in general.

## 2017-11-22 NOTE — Telephone Encounter (Signed)
Ok thx.

## 2017-11-24 ENCOUNTER — Ambulatory Visit
Admission: RE | Admit: 2017-11-24 | Discharge: 2017-11-24 | Disposition: A | Discharge status: Home / Self Care | Payer: Medicare Other | Source: Ambulatory Visit | Attending: Orthopedic Surgery | Admitting: Orthopedic Surgery

## 2017-11-24 DIAGNOSIS — M5410 Radiculopathy, site unspecified: Secondary | ICD-10-CM

## 2017-11-24 DIAGNOSIS — M48061 Spinal stenosis, lumbar region without neurogenic claudication: Secondary | ICD-10-CM | POA: Diagnosis not present

## 2017-11-29 ENCOUNTER — Ambulatory Visit (INDEPENDENT_AMBULATORY_CARE_PROVIDER_SITE_OTHER): Payer: Medicare Other | Admitting: Orthopedic Surgery

## 2017-12-02 ENCOUNTER — Encounter (INDEPENDENT_AMBULATORY_CARE_PROVIDER_SITE_OTHER): Payer: Self-pay | Admitting: Orthopedic Surgery

## 2017-12-02 ENCOUNTER — Ambulatory Visit (INDEPENDENT_AMBULATORY_CARE_PROVIDER_SITE_OTHER): Payer: Medicare Other | Admitting: Orthopedic Surgery

## 2017-12-02 DIAGNOSIS — M79604 Pain in right leg: Secondary | ICD-10-CM | POA: Diagnosis not present

## 2017-12-02 NOTE — Progress Notes (Signed)
Office Visit Note   Patient: Amy Carroll           Date of Birth: 1935/01/03           MRN: [id number] Visit Date: 12/02/2017 Requested by: Lucilla Lame, [address], [place] [id number] PCP: Lucilla Lame, MD  Subjective: Chief Complaint  Patient presents with  . Lower Back - Follow-up    HPI: Patient presents for evaluation of right leg pain and low back pain.  Symptoms are worse when she is lying down and they do radiate down the right leg but not below the knee.  Going from sitting to standing is worse.  She has night pain.  No fevers and chills.  15 years ago she did have a T11 fracture which is gone on to kyphosis.  MRI of the lumbar spine is reviewed today.  It does show some central stenosis and spondylolisthesis at L4-5.  There is also fairly significant compression fracture at T11.  The pain in the leg is keeping her from wanting to use it.  She is using a cane now and a prescription is provided for that.              ROS: All systems reviewed are negative as they relate to the chief complaint within the history of present illness.  Patient denies  fevers or chills.   Assessment & Plan: Visit Diagnoses:  1. Pain in right leg     Plan: Impression is right leg pain which could be explained in part from the findings on the MRI scan of her lumbar spine.  She does and has kept a persistent effusion in that right knee.  Extensive work-up for infection has been negative in the past but that is always a consideration.  Plan at this time is MRI scan of the femur because she is reporting some hip pain in the groin as well as distal thigh pain.  Like to evaluate for femoral loosening as well as for occult right hip arthritis.  She really does not have much in the way of loss of range of motion on exam today.  Would also like to refer her to Dr. Ernestina Patches for L-spine ESI and I will see her back after the MRI scan.  We may need to restart the  work-up for infection at that time with knee aspiration.  Follow-Up Instructions: Return for after MRI.   Orders:  Orders Placed This Encounter  Procedures  . MR FRMUR RIGHT WO CONTRAST  . Ambulatory referral to Physical Medicine Rehab   No orders of the defined types were placed in this encounter.     Procedures: No procedures performed   Clinical Data: No additional findings.  Objective: Vital Signs: There were no vitals taken for this visit.  Physical Exam:   Constitutional: Patient appears well-developed HEENT:  Head: Normocephalic Eyes:EOM are normal Neck: Normal range of motion Cardiovascular: Normal rate Pulmonary/chest: Effort normal Neurologic: Patient is alert Skin: Skin is warm Psychiatric: Patient has normal mood and affect    Ortho Exam: Orthopedic exam demonstrates moderate right knee effusion.  Range of motion is excellent.  No groin pain or restriction of motion of that right hip.  No nerve root tension signs.  Pedal pulses palpable in the right.  No proximal lymphadenopathy present.  Specialty Comments:  No specialty comments available.  Imaging: No results found.   PMFS History: Patient Active Problem List   Diagnosis Date Noted  .  Presence of right artificial knee joint 04/02/2016  . Effusion, right knee 04/02/2016  . Osteoarthritis of right knee 05/27/2012    Class: Diagnosis of   Past Medical History:  Diagnosis Date  . Arthritis   . Hypertension     Family History  Problem Relation Age of Onset  . Cancer Mother   . Heart disease Father   . Cancer Brother     Past Surgical History:  Procedure Laterality Date  . ABDOMINAL HYSTERECTOMY    . JOINT REPLACEMENT    . TOTAL KNEE ARTHROPLASTY Right 05/27/2012   Procedure: TOTAL KNEE ARTHROPLASTY;  Surgeon: Meredith Pel, MD;  Location: [facility];  Service: Orthopedics;  Laterality: Right;  Right Total Knee Arthroplasty  . TUBAL LIGATION     Social History   Occupational History    . Not on file  Tobacco Use  . Smoking status: Former Research scientist (life sciences)  . Smokeless tobacco: Never Used  Substance and Sexual Activity  . Alcohol use: Yes    Alcohol/week: 1.0 standard drinks    Types: 1 Glasses of wine per week  . Drug use: No  . Sexual activity: Not on file

## 2017-12-03 DIAGNOSIS — H353211 Exudative age-related macular degeneration, right eye, with active choroidal neovascularization: Secondary | ICD-10-CM | POA: Diagnosis not present

## 2017-12-03 DIAGNOSIS — H353121 Nonexudative age-related macular degeneration, left eye, early dry stage: Secondary | ICD-10-CM | POA: Diagnosis not present

## 2017-12-03 DIAGNOSIS — H40013 Open angle with borderline findings, low risk, bilateral: Secondary | ICD-10-CM | POA: Diagnosis not present

## 2017-12-03 DIAGNOSIS — H2513 Age-related nuclear cataract, bilateral: Secondary | ICD-10-CM | POA: Diagnosis not present

## 2017-12-04 ENCOUNTER — Ambulatory Visit (INDEPENDENT_AMBULATORY_CARE_PROVIDER_SITE_OTHER): Payer: Medicare Other | Admitting: Orthopedic Surgery

## 2017-12-04 ENCOUNTER — Encounter (INDEPENDENT_AMBULATORY_CARE_PROVIDER_SITE_OTHER): Payer: Medicare Other | Admitting: Ophthalmology

## 2017-12-04 DIAGNOSIS — H353211 Exudative age-related macular degeneration, right eye, with active choroidal neovascularization: Secondary | ICD-10-CM | POA: Diagnosis not present

## 2017-12-04 DIAGNOSIS — H2513 Age-related nuclear cataract, bilateral: Secondary | ICD-10-CM | POA: Diagnosis not present

## 2017-12-04 DIAGNOSIS — I10 Essential (primary) hypertension: Secondary | ICD-10-CM

## 2017-12-04 DIAGNOSIS — H35033 Hypertensive retinopathy, bilateral: Secondary | ICD-10-CM | POA: Diagnosis not present

## 2017-12-04 DIAGNOSIS — H353122 Nonexudative age-related macular degeneration, left eye, intermediate dry stage: Secondary | ICD-10-CM

## 2017-12-04 DIAGNOSIS — H43813 Vitreous degeneration, bilateral: Secondary | ICD-10-CM | POA: Diagnosis not present

## 2017-12-07 ENCOUNTER — Ambulatory Visit
Admission: RE | Admit: 2017-12-07 | Discharge: 2017-12-07 | Disposition: A | Discharge status: Home / Self Care | Payer: Medicare Other | Source: Ambulatory Visit | Attending: Orthopedic Surgery | Admitting: Orthopedic Surgery

## 2017-12-07 DIAGNOSIS — M25451 Effusion, right hip: Secondary | ICD-10-CM | POA: Diagnosis not present

## 2017-12-07 DIAGNOSIS — M79604 Pain in right leg: Secondary | ICD-10-CM

## 2017-12-16 ENCOUNTER — Encounter (INDEPENDENT_AMBULATORY_CARE_PROVIDER_SITE_OTHER): Payer: Self-pay | Admitting: Orthopedic Surgery

## 2017-12-16 ENCOUNTER — Ambulatory Visit (INDEPENDENT_AMBULATORY_CARE_PROVIDER_SITE_OTHER): Payer: Medicare Other | Admitting: Orthopedic Surgery

## 2017-12-16 DIAGNOSIS — M79604 Pain in right leg: Secondary | ICD-10-CM | POA: Diagnosis not present

## 2017-12-16 NOTE — Progress Notes (Signed)
Office Visit Note   Patient: Amy Carroll           Date of Birth: 07-Dec-1934           MRN: [id number] Visit Date: 12/16/2017 Requested by: Lucilla Lame, [address], [place] [id number] PCP: Lucilla Lame, MD  Subjective: Chief Complaint  Patient presents with  . Follow-up    HPI: Amy Carroll is a patient here to follow-up right femur MRI scan.  The scan did show a right knee effusion along with fairly severe right hip arthritis.  She also has some moderate spinal stenosis in her back.  She is having right leg pain.  Difficult to ascertain what percentage of her pain is coming from the hip arthritis and what percentage is coming from her back.              ROS: All systems reviewed are negative as they relate to the chief complaint within the history of present illness.  Patient denies  fevers or chills.   Assessment & Plan: Visit Diagnoses:  1. Pain in right leg     Plan: Impression is right hip arthritis spinal stenosis and some type of particle reaction going on in the knee.  We have evaluated the knee on multiple occasions for infection in does not look to be infected by aspiration or by lab work.  I think that most of her pain is likely coming from the hip and some of it is likely coming from the back.  I like to do diagnostic and therapeutic injections in the both.  To that end I would like to have Dr. Ernestina Patches inject the right hip on Thursday and then 2 weeks later inject the back.  We will try to see what percentage of her pain is coming from these areas.  I will see her back in about 4 weeks just to reassess the situation and likely plan for poly-exchange in that right knee at that time.  Follow-Up Instructions: Return in about 4 weeks (around 01/13/2018).   Orders:  Orders Placed This Encounter  Procedures  . Ambulatory referral to Physical Medicine Rehab   No orders of the defined types were placed in this  encounter.     Procedures: No procedures performed   Clinical Data: No additional findings.  Objective: Vital Signs: There were no vitals taken for this visit.  Physical Exam:   Constitutional: Patient appears well-developed HEENT:  Head: Normocephalic Eyes:EOM are normal Neck: Normal range of motion Cardiovascular: Normal rate Pulmonary/chest: Effort normal Neurologic: Patient is alert Skin: Skin is warm Psychiatric: Patient has normal mood and affect    Ortho Exam: Ortho exam demonstrates full active and passive range of motion of the left hip.  On the right-hand side it is somewhat painful.  She still has a right knee effusion.  No nerve retention signs.  She is walking with a cane.  The rest of her exam is unchanged  Specialty Comments:  No specialty comments available.  Imaging: No results found.   PMFS History: Patient Active Problem List   Diagnosis Date Noted  . Presence of right artificial knee joint 04/02/2016  . Effusion, right knee 04/02/2016  . Osteoarthritis of right knee 05/27/2012    Class: Diagnosis of   Past Medical History:  Diagnosis Date  . Arthritis   . Hypertension     Family History  Problem Relation Age of Onset  . Cancer Mother   . Heart disease  Father   . Cancer Brother     Past Surgical History:  Procedure Laterality Date  . ABDOMINAL HYSTERECTOMY    . JOINT REPLACEMENT    . TOTAL KNEE ARTHROPLASTY Right 05/27/2012   Procedure: TOTAL KNEE ARTHROPLASTY;  Surgeon: Meredith Pel, MD;  Location: [facility];  Service: Orthopedics;  Laterality: Right;  Right Total Knee Arthroplasty  . TUBAL LIGATION     Social History   Occupational History  . Not on file  Tobacco Use  . Smoking status: Former Research scientist (life sciences)  . Smokeless tobacco: Never Used  Substance and Sexual Activity  . Alcohol use: Yes    Alcohol/week: 1.0 standard drinks    Types: 1 Glasses of wine per week  . Drug use: No  . Sexual activity: Not on file

## 2017-12-19 ENCOUNTER — Encounter (INDEPENDENT_AMBULATORY_CARE_PROVIDER_SITE_OTHER): Payer: Self-pay | Admitting: Physical Medicine and Rehabilitation

## 2017-12-19 ENCOUNTER — Ambulatory Visit (INDEPENDENT_AMBULATORY_CARE_PROVIDER_SITE_OTHER): Payer: Medicare Other | Admitting: Physical Medicine and Rehabilitation

## 2017-12-19 ENCOUNTER — Ambulatory Visit (INDEPENDENT_AMBULATORY_CARE_PROVIDER_SITE_OTHER): Payer: Self-pay

## 2017-12-19 DIAGNOSIS — M47816 Spondylosis without myelopathy or radiculopathy, lumbar region: Secondary | ICD-10-CM

## 2017-12-19 DIAGNOSIS — M48062 Spinal stenosis, lumbar region with neurogenic claudication: Secondary | ICD-10-CM

## 2017-12-19 DIAGNOSIS — M25551 Pain in right hip: Secondary | ICD-10-CM | POA: Diagnosis not present

## 2017-12-19 NOTE — Progress Notes (Signed)
 .  Numeric Pain Rating Scale and Functional Assessment Average Pain 6   In the last MONTH (on 0-10 scale) has pain interfered with the following?  1. General activity like being  able to carry out your everyday physical activities such as walking, climbing stairs, carrying groceries, or moving a chair?  Rating(4)    -Dye Allergies.  

## 2017-12-19 NOTE — Progress Notes (Signed)
DEVRI KREHER - 83 y.o. female MRN [id number]  Date of birth: 09/21/1934  Office Visit Note: Visit Date: 12/19/2017 PCP: Lucilla Lame, MD Referred by: Lucilla Lame,*  Subjective: Chief Complaint  Patient presents with  . Right Hip - Pain   HPI:  LADAN VANDERZANDEN is a 83 y.o. female who comes in today At the request of Dr. Anderson Malta for her right hip and leg pain.  She reports chronic worsening severe at times pain in the right hip and groin which is her biggest issue.  She reports this started several months ago without specific trauma.  She is been followed by Dr. Marlou Sa with good conservative care and he has completed MRI of the lumbar spine and hip.  This did show severe hip arthritis.  Also showed moderate multifactorial stenosis at L4-5 with facet arthropathy and listhesis at L4-5 and L5-S2 and somewhat of a stairstep fashion.  She has had old compression fracture with some increased kyphosis as well.  She does report some pain down the leg laterally to the ankle.  Denies real paresthesia or focal weakness although feels weak at times.  She has had no red flag complaints of bowel or bladder issues or focal night pain.  Pain is been relieved at some degree with Tylenol and changing positions.  Review of Systems  Constitutional: Negative for chills, fever, malaise/fatigue and weight loss.  HENT: Negative for hearing loss and sinus pain.   Eyes: Negative for blurred vision, double vision and photophobia.  Respiratory: Negative for cough and shortness of breath.   Cardiovascular: Negative for chest pain, palpitations and leg swelling.  Gastrointestinal: Negative for abdominal pain, nausea and vomiting.  Genitourinary: Negative for flank pain.  Musculoskeletal: Positive for back pain and joint pain. Negative for myalgias.       Right leg pain  Skin: Negative for itching and rash.  Neurological: Negative for tremors, focal weakness and weakness.    Endo/Heme/Allergies: Negative.   Psychiatric/Behavioral: Negative for depression.  All other systems reviewed and are negative.  Otherwise per HPI.  Assessment & Plan: Visit Diagnoses:  1. Pain in right hip   2. Spinal stenosis of lumbar region with neurogenic claudication   3. Spondylosis without myelopathy or radiculopathy, lumbar region     Plan: Findings:  Seemingly 2 distinct issues with the worst appearing to be the right hip with concordant hip and groin pain consistent with exam and imaging findings of severe arthritis.  She also reports pain down the leg and somewhat of an L5 distribution to the ankle.  No real paresthesia.  Does have moderate multifactorial stenosis at L4-5 with listhesis and a stairstep fashion but no frank nerve compression.  Today we are going to complete diagnostic anesthetic hip arthrogram on the right.  Depending on relief were seeing her back in a few weeks for possible lumbar spine intervention.  Obviously this would be more of an L5 transforaminal approach given her symptoms.  Depending on relief of the hip injection however if it is relieving the other symptoms as well she will return to see Dr. Marlou Sa in follow-up.  I have seen patients with L5 or sciatic nerve type distribution pattern even with just hip arthritis which likely comes from innervation of the hip joint capsule posteriorly on the sciatic nerve.  She will continue current activities and current medications.  As of note patient did get a lot of relief with her hip and groin complaints with the diagnostic injection.  Meds & Orders: No orders of the defined types were placed in this encounter.   Orders Placed This Encounter  Procedures  . Large Joint Inj: R hip joint  . XR C-ARM NO REPORT    Follow-up: Return for Right L5 transforaminal epidural steroid injection and f/u Dr. Marlou Sa.   Procedures: Large Joint Inj: R hip joint on 12/19/2017 2:40 PM Indications: pain and diagnostic  evaluation Details: 22 G needle, anterior approach  Arthrogram: Yes  Medications: 80 mg triamcinolone acetonide 40 MG/ML; 3 mL bupivacaine 0.5 % Outcome: tolerated well, no immediate complications  Arthrogram demonstrated excellent flow of contrast throughout the joint surface without extravasation or obvious defect.  The patient had relief of symptoms during the anesthetic phase of the injection.  Procedure, treatment alternatives, risks and benefits explained, specific risks discussed. Consent was given by the patient. Immediately prior to procedure a time out was called to verify the correct patient, procedure, equipment, support staff and site/side marked as required. Patient was prepped and draped in the usual sterile fashion.      No notes on file   Clinical History: MRI LUMBAR SPINE WITHOUT CONTRAST  TECHNIQUE: Multiplanar, multisequence MR imaging of the lumbar spine was performed. No intravenous contrast was administered.  COMPARISON:  None.  FINDINGS: Segmentation:  5 lumbar type vertebral bodies assumed.  Alignment: Increased kyphotic curvature in the lower thoracic region due to an old compression fracture at T11. See below. 6-7 mm of anterolisthesis at L4-5. One or 2 mm of anterolisthesis at L5-S1. There is 3 mm retrolisthesis at T12-L1 and 2 mm of retrolisthesis at L1-2.  Vertebrae: As noted above, there is an old anterior wedge compression fracture at T11 which is completely healed without residual edema. There is increased kyphotic angulation at this level.  Conus medullaris and cauda equina: Conus extends to the T12 level. Conus and cauda equina appear normal.  Paraspinal and other soft tissues: Renal cysts, not primarily or completely evaluated.  Disc levels:  T10-11: Because of the old fracture and kyphotic angulation, there is narrowing of the canal with effacement of the ventral subarachnoid space but no actual cord compression. Foramina  appear sufficiently patent.  T11-12: Unremarkable.  T12-L1: Retrolisthesis of 3 mm. Disc degeneration and bulging. No apparent compressive stenosis.  L1-2: 2 mm retrolisthesis. Disc degeneration and bulging. No compressive stenosis.  L2-3: Desiccation and bulging of the disc slightly more towards the left. Mild narrowing of the left lateral recess but no apparent compressive stenosis.  L3-4: Mild disc bulge and facet degeneration. No compressive stenosis.  L4-5: Bilateral facet arthropathy with 6-7 mm of anterolisthesis. Bulging of the disc. Moderate canal stenosis at this level. Definite neural compression is not established, but the appearance could worsen with standing or flexion.  L5-S1: Bilateral facet degeneration with 1 or 2 mm of anterolisthesis. Bulging of the disc. No apparent compressive stenosis.  IMPRESSION: Old healed anterior wedge compression fracture of T11 with kyphotic angulation of the spine at this level. No apparent neural compression however.  T12-L1 disc degeneration with 3 mm retrolisthesis but no apparent compressive stenosis.  Mild non-compressive degenerative changes at L1-2, L2-3 and L3-4.  L4-5 shows bilateral facet arthropathy with anterolisthesis of 6-7 mm and bulging of the disc. There is moderate canal stenosis at this level. Definite neural compression is not demonstrated, but the appearance could worsen with standing or flexion. The findings could certainly be associated with low back pain.  L5-S1 facet osteoarthritis which could be a cause of back  pain or referred facet syndrome pain. No compressive stenosis.   Electronically Signed   By: Nelson Chimes M.D.   On: 11/25/2017 08:47     Objective:  VS:  HT:    WT:   BMI:     BP:   HR: bpm  TEMP: ( )  RESP:  Physical Exam  Constitutional: She is oriented to person, place, and time. She appears well-developed and well-nourished.  Eyes: Pupils are equal, round,  and reactive to light. Conjunctivae and EOM are normal.  Cardiovascular: Normal rate and intact distal pulses.  Pulmonary/Chest: Effort normal.  Musculoskeletal:  Patient stands with a forward flexed lumbar spine with increased upper kyphosis.  She has pain going into full extension with lumbar extension and facet joint loading.  She has exquisite pain with right hip internal rotation and decreased range of motion.  She has a negative slump test bilaterally although it is difficult for her to do sitting.  She has good distal strength.  Neurological: She is alert and oriented to person, place, and time. She exhibits normal muscle tone. Coordination normal.  Skin: Skin is warm and dry. No rash noted. No erythema.  Psychiatric: She has a normal mood and affect. Her behavior is normal.  Nursing note and vitals reviewed.   Ortho Exam Imaging: No results found.

## 2017-12-19 NOTE — Patient Instructions (Signed)

## 2017-12-21 DIAGNOSIS — Z23 Encounter for immunization: Secondary | ICD-10-CM | POA: Diagnosis not present

## 2017-12-26 MED ORDER — TRIAMCINOLONE ACETONIDE 40 MG/ML IJ SUSP
80.0000 mg | INTRAMUSCULAR | Status: AC | PRN
  Administered 2017-12-19: 80 mg via INTRA_ARTICULAR

## 2017-12-26 MED ORDER — BUPIVACAINE HCL 0.5 % IJ SOLN
3.0000 mL | INTRAMUSCULAR | Status: AC | PRN
  Administered 2017-12-19: 3 mL via INTRA_ARTICULAR

## 2017-12-30 DIAGNOSIS — Z1231 Encounter for screening mammogram for malignant neoplasm of breast: Secondary | ICD-10-CM | POA: Diagnosis not present

## 2018-01-02 ENCOUNTER — Encounter (INDEPENDENT_AMBULATORY_CARE_PROVIDER_SITE_OTHER): Payer: Self-pay | Admitting: Physical Medicine and Rehabilitation

## 2018-01-06 ENCOUNTER — Encounter (INDEPENDENT_AMBULATORY_CARE_PROVIDER_SITE_OTHER): Payer: Medicare Other | Admitting: Ophthalmology

## 2018-01-06 DIAGNOSIS — H2513 Age-related nuclear cataract, bilateral: Secondary | ICD-10-CM

## 2018-01-06 DIAGNOSIS — H43813 Vitreous degeneration, bilateral: Secondary | ICD-10-CM

## 2018-01-06 DIAGNOSIS — I10 Essential (primary) hypertension: Secondary | ICD-10-CM

## 2018-01-06 DIAGNOSIS — H35033 Hypertensive retinopathy, bilateral: Secondary | ICD-10-CM | POA: Diagnosis not present

## 2018-01-06 DIAGNOSIS — H353211 Exudative age-related macular degeneration, right eye, with active choroidal neovascularization: Secondary | ICD-10-CM | POA: Diagnosis not present

## 2018-01-06 DIAGNOSIS — H353122 Nonexudative age-related macular degeneration, left eye, intermediate dry stage: Secondary | ICD-10-CM | POA: Diagnosis not present

## 2018-01-13 ENCOUNTER — Ambulatory Visit (INDEPENDENT_AMBULATORY_CARE_PROVIDER_SITE_OTHER): Payer: Medicare Other | Admitting: Orthopedic Surgery

## 2018-01-13 ENCOUNTER — Encounter (INDEPENDENT_AMBULATORY_CARE_PROVIDER_SITE_OTHER): Payer: Self-pay | Admitting: Orthopedic Surgery

## 2018-01-13 DIAGNOSIS — M79604 Pain in right leg: Secondary | ICD-10-CM

## 2018-01-15 ENCOUNTER — Encounter (INDEPENDENT_AMBULATORY_CARE_PROVIDER_SITE_OTHER): Payer: Self-pay | Admitting: Orthopedic Surgery

## 2018-01-15 NOTE — Progress Notes (Signed)
Office Visit Note   Patient: Amy Carroll           Date of Birth: 1934/03/11           MRN: [id number] Visit Date: 01/13/2018 Requested by: Lucilla Lame, [address] [place], [place] [address] PCP: Lucilla Lame, MD  Subjective: Chief Complaint  Patient presents with  . Right Leg - Follow-up    HPI: There is a patient who is here to follow-up her right hip injection.  This is done 12/19/2017.  This gave her actually significant relief.  Never got the back injected.  She takes occasional Tylenol.  Not having any fevers and chills.  MRI scan also showed particle type effusion in that right knee.  She is had no pain in the knee but has had an effusion for over a year.  Infection work-up has been negative.              ROS: All systems reviewed are negative as they relate to the chief complaint within the history of present illness.  Patient denies  fevers or chills.   Assessment & Plan: Visit Diagnoses:  1. Pain in right leg     Plan: Impression is arthritis causing the right leg symptoms with persistent right knee effusion.  Plan is to refer her to [facility] dermatology for evaluation for metal allergy.  I think we need to get that metal allergy worked out before just doing a poly-exchange if that comes the past.  We will see her back after that evaluation.  She really does not have any great history of issues with her watches or jewelry other than occasional earlobe irritation from pierced earrings.  Follow-Up Instructions: Return in about 6 weeks (around 02/24/2018).   Orders:  Orders Placed This Encounter  Procedures  . Ambulatory referral to Rheumatology   No orders of the defined types were placed in this encounter.     Procedures: No procedures performed   Clinical Data: No additional findings.  Objective: Vital Signs: There were no vitals taken for this visit.  Physical Exam:   Constitutional: Patient appears  well-developed HEENT:  Head: Normocephalic Eyes:EOM are normal Neck: Normal range of motion Cardiovascular: Normal rate Pulmonary/chest: Effort normal Neurologic: Patient is alert Skin: Skin is warm Psychiatric: Patient has normal mood and affect    Ortho Exam: Ortho exam demonstrates full active and passive range of motion of the left knee.  On the right knee she does have an effusion but no lymphadenopathy.  Does have a little groin pain with internal extra rotation of the leg but less so than prior to the injection.  Pedal pulses palpable.  Gait is close to normal.  Specialty Comments:  No specialty comments available.  Imaging: No results found.   PMFS History: Patient Active Problem List   Diagnosis Date Noted  . Presence of right artificial knee joint 04/02/2016  . Effusion, right knee 04/02/2016  . Osteoarthritis of right knee 05/27/2012    Class: Diagnosis of   Past Medical History:  Diagnosis Date  . Arthritis   . Hypertension     Family History  Problem Relation Age of Onset  . Cancer Mother   . Heart disease Father   . Cancer Brother     Past Surgical History:  Procedure Laterality Date  . ABDOMINAL HYSTERECTOMY    . JOINT REPLACEMENT    . TOTAL KNEE ARTHROPLASTY Right 05/27/2012   Procedure: TOTAL KNEE ARTHROPLASTY;  Surgeon:  Meredith Pel, MD;  Location: [facility];  Service: Orthopedics;  Laterality: Right;  Right Total Knee Arthroplasty  . TUBAL LIGATION     Social History   Occupational History  . Not on file  Tobacco Use  . Smoking status: Former Research scientist (life sciences)  . Smokeless tobacco: Never Used  Substance and Sexual Activity  . Alcohol use: Yes    Alcohol/week: 1.0 standard drinks    Types: 1 Glasses of wine per week  . Drug use: No  . Sexual activity: Not on file

## 2018-01-28 ENCOUNTER — Telehealth (INDEPENDENT_AMBULATORY_CARE_PROVIDER_SITE_OTHER): Payer: Self-pay | Admitting: Orthopedic Surgery

## 2018-01-28 DIAGNOSIS — H353230 Exudative age-related macular degeneration, bilateral, stage unspecified: Secondary | ICD-10-CM | POA: Diagnosis not present

## 2018-01-28 DIAGNOSIS — H2513 Age-related nuclear cataract, bilateral: Secondary | ICD-10-CM | POA: Diagnosis not present

## 2018-01-28 DIAGNOSIS — H40013 Open angle with borderline findings, low risk, bilateral: Secondary | ICD-10-CM | POA: Diagnosis not present

## 2018-01-28 DIAGNOSIS — H353130 Nonexudative age-related macular degeneration, bilateral, stage unspecified: Secondary | ICD-10-CM | POA: Diagnosis not present

## 2018-01-28 NOTE — Telephone Encounter (Signed)
Looks like Lauren sent order for rheumatologist? Was that correct in addition to the dermatologist? Or just the dermatologist?

## 2018-01-28 NOTE — Telephone Encounter (Signed)
Patient was called to schedule Rheumatology appt. Patient is confused, stating doctor did not mention anything about a Rheumatology appt. Doctor did mention a referral to an allergist, and patient was waiting on a call for that referral. Patient wants to discuss rheumatology referral before she schedules an appt. Please contact patient to discuss rheumatology referral.

## 2018-01-28 NOTE — Telephone Encounter (Signed)
She needs to be referred to a dermatologist for skin testing for allergies thanks

## 2018-01-29 ENCOUNTER — Other Ambulatory Visit (INDEPENDENT_AMBULATORY_CARE_PROVIDER_SITE_OTHER): Payer: Self-pay

## 2018-01-29 DIAGNOSIS — Z889 Allergy status to unspecified drugs, medicaments and biological substances status: Secondary | ICD-10-CM

## 2018-01-29 NOTE — Telephone Encounter (Signed)
Patient aware that was a mistake and it has been corrected and a order sent to Dermatology

## 2018-02-03 ENCOUNTER — Telehealth (INDEPENDENT_AMBULATORY_CARE_PROVIDER_SITE_OTHER): Payer: Self-pay | Admitting: Orthopedic Surgery

## 2018-02-03 ENCOUNTER — Other Ambulatory Visit (INDEPENDENT_AMBULATORY_CARE_PROVIDER_SITE_OTHER): Payer: Self-pay | Admitting: *Deleted

## 2018-02-03 DIAGNOSIS — Z889 Allergy status to unspecified drugs, medicaments and biological substances status: Secondary | ICD-10-CM

## 2018-02-03 NOTE — Telephone Encounter (Signed)
Can we send her to allergist instead please

## 2018-02-03 NOTE — Telephone Encounter (Signed)
Pt called stating that Dr.Dean referred her to a Dermatologist but the Dermatologist  told her she needed to go to an allergist.

## 2018-02-03 NOTE — Telephone Encounter (Signed)
Allergist referral placed

## 2018-02-06 DIAGNOSIS — H401111 Primary open-angle glaucoma, right eye, mild stage: Secondary | ICD-10-CM | POA: Diagnosis not present

## 2018-02-06 DIAGNOSIS — H25041 Posterior subcapsular polar age-related cataract, right eye: Secondary | ICD-10-CM | POA: Diagnosis not present

## 2018-02-06 DIAGNOSIS — H268 Other specified cataract: Secondary | ICD-10-CM | POA: Diagnosis not present

## 2018-02-28 ENCOUNTER — Encounter (INDEPENDENT_AMBULATORY_CARE_PROVIDER_SITE_OTHER): Payer: Medicare Other | Admitting: Ophthalmology

## 2018-03-07 DIAGNOSIS — I10 Essential (primary) hypertension: Secondary | ICD-10-CM | POA: Diagnosis not present

## 2018-03-07 DIAGNOSIS — R7303 Prediabetes: Secondary | ICD-10-CM | POA: Diagnosis not present

## 2018-03-07 DIAGNOSIS — M8580 Other specified disorders of bone density and structure, unspecified site: Secondary | ICD-10-CM | POA: Diagnosis not present

## 2018-03-07 DIAGNOSIS — R5383 Other fatigue: Secondary | ICD-10-CM | POA: Diagnosis not present

## 2018-03-07 DIAGNOSIS — Z0000 Encounter for general adult medical examination without abnormal findings: Secondary | ICD-10-CM | POA: Diagnosis not present

## 2018-03-12 ENCOUNTER — Encounter (INDEPENDENT_AMBULATORY_CARE_PROVIDER_SITE_OTHER): Payer: Medicare Other | Admitting: Ophthalmology

## 2018-03-12 DIAGNOSIS — H35033 Hypertensive retinopathy, bilateral: Secondary | ICD-10-CM

## 2018-03-12 DIAGNOSIS — H353211 Exudative age-related macular degeneration, right eye, with active choroidal neovascularization: Secondary | ICD-10-CM | POA: Diagnosis not present

## 2018-03-12 DIAGNOSIS — I10 Essential (primary) hypertension: Secondary | ICD-10-CM

## 2018-03-12 DIAGNOSIS — H2512 Age-related nuclear cataract, left eye: Secondary | ICD-10-CM

## 2018-03-12 DIAGNOSIS — H43813 Vitreous degeneration, bilateral: Secondary | ICD-10-CM | POA: Diagnosis not present

## 2018-03-12 DIAGNOSIS — H353122 Nonexudative age-related macular degeneration, left eye, intermediate dry stage: Secondary | ICD-10-CM | POA: Diagnosis not present

## 2018-03-13 DIAGNOSIS — R7303 Prediabetes: Secondary | ICD-10-CM | POA: Diagnosis not present

## 2018-03-13 DIAGNOSIS — I10 Essential (primary) hypertension: Secondary | ICD-10-CM | POA: Diagnosis not present

## 2018-03-13 DIAGNOSIS — R5383 Other fatigue: Secondary | ICD-10-CM | POA: Diagnosis not present

## 2018-03-13 DIAGNOSIS — M8580 Other specified disorders of bone density and structure, unspecified site: Secondary | ICD-10-CM | POA: Diagnosis not present

## 2018-03-17 ENCOUNTER — Telehealth: Payer: Self-pay | Admitting: *Deleted

## 2018-03-17 ENCOUNTER — Encounter: Payer: Self-pay | Admitting: Allergy and Immunology

## 2018-03-17 ENCOUNTER — Ambulatory Visit (INDEPENDENT_AMBULATORY_CARE_PROVIDER_SITE_OTHER): Payer: Medicare Other | Admitting: Allergy and Immunology

## 2018-03-17 ENCOUNTER — Telehealth (INDEPENDENT_AMBULATORY_CARE_PROVIDER_SITE_OTHER): Payer: Self-pay

## 2018-03-17 DIAGNOSIS — L230 Allergic contact dermatitis due to metals: Secondary | ICD-10-CM | POA: Insufficient documentation

## 2018-03-17 NOTE — Telephone Encounter (Signed)
Mildred at [facility], Dr. Mariane Masters office would like to know if they could get a sample of the metal/hardware that was placed into patient's knee to be tested?  Cb# is [phone].  Please advise.  Thank you.

## 2018-03-17 NOTE — Telephone Encounter (Signed)
Pls call Amy Carroll to see if thsat is possible

## 2018-03-17 NOTE — Telephone Encounter (Signed)
Called Dr Randel Pigg office to request sample of metal that was use for knee replacement per Dr Verlin Fester. We would need a sample so we can test patient Amy Carroll stated she would check with Dr Marlou Sa and call us back.

## 2018-03-17 NOTE — Patient Instructions (Addendum)
Allergic contact dermatitis due to metals The patient's history suggest the possibility of allergic contact dermatitis to metal in her prosthetic.  We will contact Dr. Nicki Reaper Dean's office so as to procure a sample of the metal alloy used in the patient's prosthetic.  The patient will return in the near future for metal patch testing.  Instructions have been provided.  Further recommendations will be made based upon patch test results.   Return for metal patch testing.

## 2018-03-17 NOTE — Assessment & Plan Note (Signed)
The patient's history suggest the possibility of allergic contact dermatitis to metal in her prosthetic.  We will contact Dr. Nicki Reaper Dean's office so as to procure a sample of the metal alloy used in the patient's prosthetic.  The patient will return in the near future for metal patch testing.  Instructions have been provided.  Further recommendations will be made based upon patch test results.

## 2018-03-17 NOTE — Progress Notes (Signed)
New Patient Note  RE: FATEN FRIESON MRN: [id number] DOB: [birth year] Date of Office Visit: 03/17/2018  Referring provider: Meredith Pel, MD Primary care provider: Merrilee Seashore, MD  Chief Complaint: Other (possible metal allergy)   History of present illness: Amy Carroll is a 84 y.o. female seen today in consultation requested by Lucilla Lame, MD.  She reports that she had right total knee arthroplasty 3 or 4 years ago under the care of Dr. Alphonzo Severance.  Since that time, she has had right knee swelling and "some heat."  She states that over 6 months she has knee aspirations, however recently very little if any fluid has been aspirated.  She had right knee MRI which "showed debris" in the joint.  The question has surfaced if the inflammation of the knee is related to metal allergy.  She notes that she is unable to wear earrings because the earring posts would cause irritation and pruritus of the earlobes.  She has not worn earrings over the past few decades with the exception of occasional gold earrings.  Assessment and plan: Allergic contact dermatitis due to metals The patient's history suggest the possibility of allergic contact dermatitis to metal in her prosthetic.  We will contact Dr. Nicki Reaper Dean's office so as to procure a sample of the metal alloy used in the patient's prosthetic.  The patient will return in the near future for metal patch testing.  Instructions have been provided.  Further recommendations will be made based upon patch test results.   Diagnostics: Return in the near future for metal patch testing.    Physical examination: Blood pressure 138/88, pulse 83, temperature 97.6 F (36.4 C), temperature source Oral, resp. rate 16, height 5\' 3"  (1.6 m), weight 146 lb (66.2 kg), SpO2 98 %.  General: Alert, interactive, in no acute distress. Neck: Supple without lymphadenopathy. Lungs: Clear to auscultation without wheezing, rhonchi or  rales. CV: Normal S1, S2 without murmurs. Abdomen: Nondistended, nontender. Skin: Warm and dry, without lesions or rashes. Extremities:  No clubbing, cyanosis or dependent edema. Neuro:   Grossly intact.  Review of systems:  Review of systems negative except as noted in HPI / PMHx or noted below: Review of Systems  Constitutional: Negative.   HENT: Negative.   Eyes: Negative.   Respiratory: Negative.   Cardiovascular: Negative.   Gastrointestinal: Negative.   Genitourinary: Negative.   Musculoskeletal: Negative.   Skin: Negative.   Neurological: Negative.   Endo/Heme/Allergies: Negative.   Psychiatric/Behavioral: Negative.     Past medical history:  Past Medical History:  Diagnosis Date  . Arthritis   . Hypertension     Past surgical history:  Past Surgical History:  Procedure Laterality Date  . ABDOMINAL HYSTERECTOMY    . JOINT REPLACEMENT    . TONSILLECTOMY    . TOTAL KNEE ARTHROPLASTY Right 05/27/2012   Procedure: TOTAL KNEE ARTHROPLASTY;  Surgeon: Meredith Pel, MD;  Location: [facility];  Service: Orthopedics;  Laterality: Right;  Right Total Knee Arthroplasty  . TUBAL LIGATION      Family history: Family History  Problem Relation Age of Onset  . Cancer Mother   . Heart disease Father   . Cancer Brother   . Asthma Brother     Social history: Social History   Socioeconomic History  . Marital status: Married    Spouse name: Not on file  . Number of children: Not on file  . Years of education: Not on file  . Highest education  level: Not on file  Occupational History  . Not on file  Social Needs  . Financial resource strain: Not on file  . Food insecurity:    Worry: Not on file    Inability: Not on file  . Transportation needs:    Medical: Not on file    Non-medical: Not on file  Tobacco Use  . Smoking status: Former Research scientist (life sciences)  . Smokeless tobacco: Never Used  Substance and Sexual Activity  . Alcohol use: Yes    Alcohol/week: 1.0 standard drinks      Types: 1 Glasses of wine per week  . Drug use: No  . Sexual activity: Not on file  Lifestyle  . Physical activity:    Days per week: Not on file    Minutes per session: Not on file  . Stress: Not on file  Relationships  . Social connections:    Talks on phone: Not on file    Gets together: Not on file    Attends religious service: Not on file    Active member of club or organization: Not on file    Attends meetings of clubs or organizations: Not on file    Relationship status: Not on file  . Intimate partner violence:    Fear of current or ex partner: Not on file    Emotionally abused: Not on file    Physically abused: Not on file    Forced sexual activity: Not on file  Other Topics Concern  . Not on file  Social History Narrative  . Not on file   Environmental History: The patient lives in a 82 year old apartment with carpeting in the bedroom and central air/heat.  She is a non-smoker without pets.  There is no known mold/water damage in the home.  Allergies as of 03/17/2018   No Known Allergies     Medication List       Accurate as of March 17, 2018  3:32 PM. Always use your most recent med list.        amLODipine 5 MG tablet Commonly known as:  NORVASC   BESIVANCE 0.6 % Susp Generic drug:  Besifloxacin HCl   COMBIGAN 0.2-0.5 % ophthalmic solution Generic drug:  brimonidine-timolol   diazepam 5 MG tablet Commonly known as:  VALIUM 1 po qhs prn   dorzolamide 2 % ophthalmic solution Commonly known as:  TRUSOPT   latanoprost 0.005 % ophthalmic solution Commonly known as:  XALATAN   telmisartan-hydrochlorothiazide 80-25 MG tablet Commonly known as:  MICARDIS HCT Take 1 tablet by mouth daily.       Known medication allergies: No Known Allergies  I appreciate the opportunity to take part in [place] care. Please do not hesitate to contact me with questions.  Sincerely,   R. Edgar Frisk, MD

## 2018-03-18 ENCOUNTER — Telehealth (INDEPENDENT_AMBULATORY_CARE_PROVIDER_SITE_OTHER): Payer: Self-pay | Admitting: Orthopedic Surgery

## 2018-03-18 NOTE — Telephone Encounter (Signed)
Jackelyn Poling can you call Nicki Reaper?  See below.

## 2018-03-18 NOTE — Telephone Encounter (Signed)
Called Amy Carroll w/[employer] to ask if getting a sample of the metal/hardware that was placed in patient's knee is possible.  Email was sent to [facility], along with operative report showing [employer] components used and were placed in the knee May 27, 2012.   Awaiting his reply.

## 2018-03-31 NOTE — Telephone Encounter (Signed)
Abigail Butts from [facility] dropped off metal and components description for patient. Given to [facility] to take to Dr Verlin Fester to review please call Abigail Butts cause she needs to pick up afterwards. Number listed in the back of sheet

## 2018-04-23 ENCOUNTER — Ambulatory Visit (INDEPENDENT_AMBULATORY_CARE_PROVIDER_SITE_OTHER): Payer: Medicare Other | Admitting: Orthopedic Surgery

## 2018-04-23 ENCOUNTER — Encounter (INDEPENDENT_AMBULATORY_CARE_PROVIDER_SITE_OTHER): Payer: Self-pay | Admitting: Orthopedic Surgery

## 2018-04-23 DIAGNOSIS — M25551 Pain in right hip: Secondary | ICD-10-CM | POA: Diagnosis not present

## 2018-04-25 ENCOUNTER — Encounter (INDEPENDENT_AMBULATORY_CARE_PROVIDER_SITE_OTHER): Payer: Self-pay | Admitting: Orthopedic Surgery

## 2018-04-25 NOTE — Progress Notes (Signed)
Office Visit Note   Patient: Amy Carroll           Date of Birth: Apr 17, 1934           MRN: [id number] Visit Date: 04/23/2018 Requested by: Lucilla Lame, MD [place], [place] [id number] PCP: Merrilee Seashore, MD  Subjective: Chief Complaint  Patient presents with  . Follow-up    HPI: [name] is a patient here for 7-month follow-up from right knee effusion.  She had total knee replacement done about 4 years ago.  She is had a right knee effusion since about 6 or 8 months after the index procedure.  Infection work-up including blood work and multiple aspirations has been negative.  She does have what appears to be particle wear within the knee.  She may also have a metal allergy.  We are having that worked up by the allergist with the actual prosthesis so he can create a skin patch.  She also has right hip arthritis.  She is had an injection with Dr. Ernestina Patches in the right hip in the past which is been helpful.              ROS: All systems reviewed are negative as they relate to the chief complaint within the history of present illness.  Patient denies  fevers or chills.   Assessment & Plan: Visit Diagnoses:  1. Pain in right hip     Plan: Impression is right hip arthritis.  Organ to have the right hip injected again for pain relief.  She is getting a second opinion regarding the knee which I think is a good idea.  My plan was to evaluate for metal allergy.  She is not metal allergic then in the absence of pain I think synovectomy and liner exchange could be considered.  The amount of effusion she is had in the knee for this long is atypical but again she is not really having any pain with it.  Alternatively if she is not allergic than we may have to consider some type of revision arthroplasty with [facility] zirconium components.  Follow-Up Instructions: No follow-ups on file.   Orders:  Orders Placed This Encounter  Procedures  .  Ambulatory referral to Physical Medicine Rehab   No orders of the defined types were placed in this encounter.     Procedures: No procedures performed   Clinical Data: No additional findings.  Objective: Vital Signs: There were no vitals taken for this visit.  Physical Exam:   Constitutional: Patient appears well-developed HEENT:  Head: Normocephalic Eyes:EOM are normal Neck: Normal range of motion Cardiovascular: Normal rate Pulmonary/chest: Effort normal Neurologic: Patient is alert Skin: Skin is warm Psychiatric: Patient has normal mood and affect    Ortho Exam: Ortho exam demonstrates moderate effusion with good range of motion and no real warmth to the knee.  Mild groin pain on the right with internal/external rotation.  No proximal lymphadenopathy.  Pedal pulses palpable.  Patient's gait is becoming a little bit more shuffling.  Specialty Comments:  No specialty comments available.  Imaging: No results found.   PMFS History: Patient Active Problem List   Diagnosis Date Noted  . Allergic contact dermatitis due to metals 03/17/2018  . Presence of right artificial knee joint 04/02/2016  . Effusion, right knee 04/02/2016  . Osteoarthritis of right knee 05/27/2012    Class: Diagnosis of   Past Medical History:  Diagnosis Date  . Arthritis   .  Hypertension     Family History  Problem Relation Age of Onset  . Cancer Mother   . Heart disease Father   . Cancer Brother   . Asthma Brother     Past Surgical History:  Procedure Laterality Date  . ABDOMINAL HYSTERECTOMY    . JOINT REPLACEMENT    . TONSILLECTOMY    . TOTAL KNEE ARTHROPLASTY Right 05/27/2012   Procedure: TOTAL KNEE ARTHROPLASTY;  Surgeon: Meredith Pel, MD;  Location: [facility];  Service: Orthopedics;  Laterality: Right;  Right Total Knee Arthroplasty  . TUBAL LIGATION     Social History   Occupational History  . Not on file  Tobacco Use  . Smoking status: Former Research scientist (life sciences)  . Smokeless  tobacco: Never Used  Substance and Sexual Activity  . Alcohol use: Yes    Alcohol/week: 1.0 standard drinks    Types: 1 Glasses of wine per week  . Drug use: No  . Sexual activity: Not on file

## 2018-05-07 ENCOUNTER — Encounter (INDEPENDENT_AMBULATORY_CARE_PROVIDER_SITE_OTHER): Payer: Self-pay | Admitting: Physical Medicine and Rehabilitation

## 2018-05-07 ENCOUNTER — Other Ambulatory Visit: Payer: Self-pay

## 2018-05-07 ENCOUNTER — Ambulatory Visit (INDEPENDENT_AMBULATORY_CARE_PROVIDER_SITE_OTHER): Payer: Medicare Other | Admitting: Physical Medicine and Rehabilitation

## 2018-05-07 ENCOUNTER — Ambulatory Visit (INDEPENDENT_AMBULATORY_CARE_PROVIDER_SITE_OTHER): Payer: Self-pay

## 2018-05-07 DIAGNOSIS — M25551 Pain in right hip: Secondary | ICD-10-CM | POA: Diagnosis not present

## 2018-05-07 NOTE — Progress Notes (Signed)
Amy Carroll - 84 y.o. female MRN [id number]  Date of birth: [birth year]  Office Visit Note: Visit Date: 05/07/2018 PCP: Merrilee Seashore, MD Referred by: Merrilee Seashore, MD  Subjective: Chief Complaint  Patient presents with  . Right Hip - Pain   HPI: Amy Carroll is a 84 y.o. female who comes in today At the request of Dr. Anderson Malta for diagnostic and hopefully therapeutic right hip anesthetic arthrogram.  She had prior injection and October 2019 and it was recorded at the time that she had excellent relief during the anesthetic phase.  Her husband also provides information today that that was indeed the case.  However the patient is a poor historian and really does not remember how long it seemed to last but she does know that she has been having worsening hip pain for several months difficulty with walking and a lot of complaints in general of posterior hip pain.  She denies really any groin pain.  She does have hip osteoarthritis but she also has lumbar spine she has a moderate stenosis.  She has no radicular complaints completely down the leg or paresthesia.  We did complete the injection today as a repeat.  ROS Otherwise per HPI.  Assessment & Plan: Visit Diagnoses:  1. Pain in right hip     Plan: Findings:  Once again diagnostic anesthetic arthrogram shows excellent relief during the anesthetic phase.    Meds & Orders: No orders of the defined types were placed in this encounter.   Orders Placed This Encounter  Procedures  . Large Joint Inj: R hip joint  . XR C-ARM NO REPORT    Follow-up: No follow-ups on file.   Procedures: Large Joint Inj: R hip joint on 05/07/2018 1:45 PM Indications: pain and diagnostic evaluation Details: 22 G needle, anterior approach  Arthrogram: Yes  Medications: 80 mg triamcinolone acetonide 40 MG/ML; 3 mL bupivacaine 0.5 % Outcome: tolerated well, no immediate complications  Arthrogram demonstrated excellent flow of  contrast throughout the joint surface without extravasation or obvious defect.  The patient had relief of symptoms during the anesthetic phase of the injection.  Procedure, treatment alternatives, risks and benefits explained, specific risks discussed. Consent was given by the patient. Immediately prior to procedure a time out was called to verify the correct patient, procedure, equipment, support staff and site/side marked as required. Patient was prepped and draped in the usual sterile fashion.      No notes on file   Clinical History: MRI LUMBAR SPINE WITHOUT CONTRAST  TECHNIQUE: Multiplanar, multisequence MR imaging of the lumbar spine was performed. No intravenous contrast was administered.  COMPARISON:  None.  FINDINGS: Segmentation:  5 lumbar type vertebral bodies assumed.  Alignment: Increased kyphotic curvature in the lower thoracic region due to an old compression fracture at T11. See below. 6-7 mm of anterolisthesis at L4-5. One or 2 mm of anterolisthesis at L5-S1. There is 3 mm retrolisthesis at T12-L1 and 2 mm of retrolisthesis at L1-2.  Vertebrae: As noted above, there is an old anterior wedge compression fracture at T11 which is completely healed without residual edema. There is increased kyphotic angulation at this level.  Conus medullaris and cauda equina: Conus extends to the T12 level. Conus and cauda equina appear normal.  Paraspinal and other soft tissues: Renal cysts, not primarily or completely evaluated.  Disc levels:  T10-11: Because of the old fracture and kyphotic angulation, there is narrowing of the canal with effacement of the ventral  subarachnoid space but no actual cord compression. Foramina appear sufficiently patent.  T11-12: Unremarkable.  T12-L1: Retrolisthesis of 3 mm. Disc degeneration and bulging. No apparent compressive stenosis.  L1-2: 2 mm retrolisthesis. Disc degeneration and bulging. No compressive stenosis.   L2-3: Desiccation and bulging of the disc slightly more towards the left. Mild narrowing of the left lateral recess but no apparent compressive stenosis.  L3-4: Mild disc bulge and facet degeneration. No compressive stenosis.  L4-5: Bilateral facet arthropathy with 6-7 mm of anterolisthesis. Bulging of the disc. Moderate canal stenosis at this level. Definite neural compression is not established, but the appearance could worsen with standing or flexion.  L5-S1: Bilateral facet degeneration with 1 or 2 mm of anterolisthesis. Bulging of the disc. No apparent compressive stenosis.  IMPRESSION: Old healed anterior wedge compression fracture of T11 with kyphotic angulation of the spine at this level. No apparent neural compression however.  T12-L1 disc degeneration with 3 mm retrolisthesis but no apparent compressive stenosis.  Mild non-compressive degenerative changes at L1-2, L2-3 and L3-4.  L4-5 shows bilateral facet arthropathy with anterolisthesis of 6-7 mm and bulging of the disc. There is moderate canal stenosis at this level. Definite neural compression is not demonstrated, but the appearance could worsen with standing or flexion. The findings could certainly be associated with low back pain.  L5-S1 facet osteoarthritis which could be a cause of back pain or referred facet syndrome pain. No compressive stenosis.   Electronically Signed   By: Nelson Chimes M.D.   On: 11/25/2017 08:47   She reports that she has quit smoking. She has never used smokeless tobacco. No results for input(s): HGBA1C, LABURIC in the last 8760 hours.  Objective:  VS:  HT:    WT:   BMI:     BP:   HR: bpm  TEMP: ( )  RESP:  Physical Exam  Ortho Exam Imaging: No results found.  Past Medical/Family/Surgical/Social History: Medications & Allergies reviewed per EMR, new medications updated. Patient Active Problem List   Diagnosis Date Noted  . Allergic contact dermatitis due to  metals 03/17/2018  . Presence of right artificial knee joint 04/02/2016  . Effusion, right knee 04/02/2016  . Osteoarthritis of right knee 05/27/2012    Class: Diagnosis of   Past Medical History:  Diagnosis Date  . Arthritis   . Hypertension    Family History  Problem Relation Age of Onset  . Cancer Mother   . Heart disease Father   . Cancer Brother   . Asthma Brother    Past Surgical History:  Procedure Laterality Date  . ABDOMINAL HYSTERECTOMY    . JOINT REPLACEMENT    . TONSILLECTOMY    . TOTAL KNEE ARTHROPLASTY Right 05/27/2012   Procedure: TOTAL KNEE ARTHROPLASTY;  Surgeon: Meredith Pel, MD;  Location: [facility];  Service: Orthopedics;  Laterality: Right;  Right Total Knee Arthroplasty  . TUBAL LIGATION     Social History   Occupational History  . Not on file  Tobacco Use  . Smoking status: Former Research scientist (life sciences)  . Smokeless tobacco: Never Used  Substance and Sexual Activity  . Alcohol use: Yes    Alcohol/week: 1.0 standard drinks    Types: 1 Glasses of wine per week  . Drug use: No  . Sexual activity: Not on file

## 2018-05-07 NOTE — Progress Notes (Signed)
 .  Numeric Pain Rating Scale and Functional Assessment Average Pain 5   In the last MONTH (on 0-10 scale) has pain interfered with the following?  1. General activity like being  able to carry out your everyday physical activities such as walking, climbing stairs, carrying groceries, or moving a chair?  Rating(4)   -Dye Allergies.  

## 2018-05-09 ENCOUNTER — Telehealth: Payer: Self-pay | Admitting: Allergy and Immunology

## 2018-05-09 MED ORDER — TRIAMCINOLONE ACETONIDE 40 MG/ML IJ SUSP
80.0000 mg | INTRAMUSCULAR | Status: AC | PRN
  Administered 2018-05-07: 80 mg via INTRA_ARTICULAR

## 2018-05-09 MED ORDER — BUPIVACAINE HCL 0.5 % IJ SOLN
3.0000 mL | INTRAMUSCULAR | Status: AC | PRN
  Administered 2018-05-07: 3 mL via INTRA_ARTICULAR

## 2018-05-09 NOTE — Telephone Encounter (Signed)
Patient called and said she is waiting on Dr. Verlin Fester to call her back about a test that Dr. Alphonzo Severance wanted her to have. Not sure what that test is. She said it should be in her chart.

## 2018-05-09 NOTE — Telephone Encounter (Signed)
Please advise 

## 2018-05-09 NOTE — Telephone Encounter (Signed)
Please advise on how to proceed.

## 2018-05-12 NOTE — Telephone Encounter (Signed)
Patient is calling to follow up on her message from Friday.  I explained to her that Dr Verlin Fester is out of the office and a nurse will give her a call once he responds.

## 2018-05-12 NOTE — Telephone Encounter (Signed)
Please inform that patient that she was supposed to go to [facility] or [facility] office on a Monday to have the metal patch test applied (with interpretation on Wed and Friday). Thanks.

## 2018-05-12 NOTE — Telephone Encounter (Signed)
Spoke to patient states she will wait until the pandemic calms down before she schedules appt.

## 2018-05-21 ENCOUNTER — Encounter (INDEPENDENT_AMBULATORY_CARE_PROVIDER_SITE_OTHER): Payer: Medicare Other | Admitting: Ophthalmology

## 2018-05-21 ENCOUNTER — Other Ambulatory Visit: Payer: Self-pay

## 2018-05-21 DIAGNOSIS — H33122 Parasitic cyst of retina, left eye: Secondary | ICD-10-CM | POA: Diagnosis not present

## 2018-05-21 DIAGNOSIS — H43813 Vitreous degeneration, bilateral: Secondary | ICD-10-CM | POA: Diagnosis not present

## 2018-05-21 DIAGNOSIS — H353211 Exudative age-related macular degeneration, right eye, with active choroidal neovascularization: Secondary | ICD-10-CM | POA: Diagnosis not present

## 2018-05-21 DIAGNOSIS — I10 Essential (primary) hypertension: Secondary | ICD-10-CM

## 2018-05-21 DIAGNOSIS — H35033 Hypertensive retinopathy, bilateral: Secondary | ICD-10-CM | POA: Diagnosis not present

## 2018-06-26 DIAGNOSIS — M25461 Effusion, right knee: Secondary | ICD-10-CM | POA: Diagnosis not present

## 2018-06-26 DIAGNOSIS — Z96651 Presence of right artificial knee joint: Secondary | ICD-10-CM | POA: Diagnosis not present

## 2018-06-26 DIAGNOSIS — Z471 Aftercare following joint replacement surgery: Secondary | ICD-10-CM | POA: Diagnosis not present

## 2018-07-01 DIAGNOSIS — M25461 Effusion, right knee: Secondary | ICD-10-CM | POA: Diagnosis not present

## 2018-07-01 DIAGNOSIS — Z96651 Presence of right artificial knee joint: Secondary | ICD-10-CM | POA: Diagnosis not present

## 2018-07-02 ENCOUNTER — Ambulatory Visit (INDEPENDENT_AMBULATORY_CARE_PROVIDER_SITE_OTHER): Payer: Medicare Other | Admitting: Orthopedic Surgery

## 2018-07-02 ENCOUNTER — Ambulatory Visit (INDEPENDENT_AMBULATORY_CARE_PROVIDER_SITE_OTHER): Payer: Self-pay | Admitting: Orthopedic Surgery

## 2018-07-03 ENCOUNTER — Ambulatory Visit (INDEPENDENT_AMBULATORY_CARE_PROVIDER_SITE_OTHER): Payer: Medicare Other | Admitting: Orthopedic Surgery

## 2018-07-04 ENCOUNTER — Ambulatory Visit: Payer: Self-pay | Admitting: Orthopedic Surgery

## 2018-07-11 NOTE — Progress Notes (Signed)
Please place orders in Epic as patient is being scheduled for a pre-op,appointment! Thank you!

## 2018-07-16 DIAGNOSIS — I10 Essential (primary) hypertension: Secondary | ICD-10-CM | POA: Diagnosis not present

## 2018-07-16 DIAGNOSIS — Z01818 Encounter for other preprocedural examination: Secondary | ICD-10-CM | POA: Diagnosis not present

## 2018-07-16 DIAGNOSIS — B373 Candidiasis of vulva and vagina: Secondary | ICD-10-CM | POA: Diagnosis not present

## 2018-07-16 DIAGNOSIS — Z96659 Presence of unspecified artificial knee joint: Secondary | ICD-10-CM | POA: Diagnosis not present

## 2018-07-16 DIAGNOSIS — N390 Urinary tract infection, site not specified: Secondary | ICD-10-CM | POA: Diagnosis not present

## 2018-07-16 DIAGNOSIS — R7303 Prediabetes: Secondary | ICD-10-CM | POA: Diagnosis not present

## 2018-07-28 NOTE — H&P (Signed)
TOTAL KNEE REVISION ADMISSION H&P  Patient is being admitted for right total knee arthroplasty revision  Subjective:  Chief Complaint: Septic arthritis, right knee  HPI: Amy Carroll is a 84 year old female who presents for pre-operative visit in preparation for their right total knee revision, which is scheduled on 08-06-2018 with Dr. Wynelle Link at [facility]. The patient has had previous total knee arthroplasty on the right knee in August 2014 by Dr. Marlou Sa. The patient has had symptoms in the right knee including recurrent effusion which has impacted their quality of life and ability to do activities of daily living. The patient currently has a diagnosis of right knee septic arthritis. Aspirate from arthrocentesis performed in the office on 07/01/2018 showed a cell count of >16,000 with 82% neutrophils. No organisms identified on aerobic/anaerobic cultures.   Patient Active Problem List   Diagnosis Date Noted  . Allergic contact dermatitis due to metals 03/17/2018  . Presence of right artificial knee joint 04/02/2016  . Effusion, right knee 04/02/2016  . Osteoarthritis of right knee 05/27/2012    Class: Diagnosis of   Past Medical History:  Diagnosis Date  . Arthritis   . Hypertension     Past Surgical History:  Procedure Laterality Date  . ABDOMINAL HYSTERECTOMY    . JOINT REPLACEMENT    . TONSILLECTOMY    . TOTAL KNEE ARTHROPLASTY Right 05/27/2012   Procedure: TOTAL KNEE ARTHROPLASTY;  Surgeon: Meredith Pel, MD;  Location: [place];  Service: Orthopedics;  Laterality: Right;  Right Total Knee Arthroplasty  . TUBAL LIGATION      No current facility-administered medications for this encounter.    Current Outpatient Medications  Medication Sig Dispense Refill Last Dose  . amLODipine (NORVASC) 5 MG tablet    Taking  . BESIVANCE 0.6 % SUSP    Taking  . COMBIGAN 0.2-0.5 % ophthalmic solution    Taking  . diazepam (VALIUM) 5 MG tablet 1 po qhs prn (Patient not taking:  Reported on 03/17/2018) 35 tablet 0 Not Taking  . dorzolamide (TRUSOPT) 2 % ophthalmic solution    Taking  . latanoprost (XALATAN) 0.005 % ophthalmic solution    Taking  . telmisartan-hydrochlorothiazide (MICARDIS HCT) 80-25 MG per tablet Take 1 tablet by mouth daily.   Taking   No Known Allergies  Social History   Tobacco Use  . Smoking status: Former Research scientist (life sciences)  . Smokeless tobacco: Never Used  Substance Use Topics  . Alcohol use: Yes    Alcohol/week: 1.0 standard drinks    Types: 1 Glasses of wine per week    Family History  Problem Relation Age of Onset  . Cancer Mother   . Heart disease Father   . Cancer Brother   . Asthma Brother      Review of Systems  Constitutional: Negative for chills and fever.  HENT: Negative for congestion, sore throat and tinnitus.   Eyes: Negative for double vision, photophobia and pain.  Respiratory: Negative for cough, shortness of breath and wheezing.   Cardiovascular: Negative for chest pain, palpitations and orthopnea.  Gastrointestinal: Negative for heartburn, nausea and vomiting.  Genitourinary: Negative for dysuria, frequency and urgency.  Musculoskeletal: Positive for joint pain.  Neurological: Negative for dizziness, weakness and headaches.    Objective:  Physical Exam   Well nourished and well developed.  General: Alert and oriented x3, cooperative and pleasant, no acute distress.  Head: normocephalic, atraumatic, neck supple.  Eyes: EOMI.  Respiratory: breath sounds clear in all fields,  no wheezing, rales, or rhonchi. Cardiovascular: Regular rate and rhythm, no murmurs, gallops or rubs.  Abdomen: non-tender to palpation and soft, normoactive bowel sounds. Musculoskeletal:  Right Knee Exam:  Well healed incision from previous TKA.  Massive effusion, no associated warmth.  Range of motion: 2-3 degrees of hyperextension to 120 degrees of flexion.  Significant varus-valgus and anterior-posterior laxity.   Calves soft and  nontender. Motor function intact in LE. Strength 5/5 LE bilaterally. Neuro: Distal pulses 2+. Sensation to light touch intact in LE.   Vital signs in last 24 hours: Blood pressure: 128/78 mmHg  Labs:   Estimated body mass index is 25.86 kg/m as calculated from the following:   Height as of 03/17/18: 5\' 3"  (1.6 m).   Weight as of 03/17/18: 66.2 kg.   Imaging Review Radiographs- AP and lateral of the right knee obtained at the hospital were reviewed, they demonstrate a right knee prosthesis in good position without evidence of periprosthetic abnormalities. She has excellent alignment.    Assessment/Plan:  Septic arthritis, right knee  The patient history, physical examination, clinical judgment of the provider and imaging studies are consistent with septic arthritis of the right knee(s) and total knee arthroplasty revision is deemed medically necessary. The treatment options including medical management, injection therapy arthroscopy and arthroplasty were discussed at length. The risks and benefits of total knee arthroplasty were presented and reviewed. The risks due to aseptic loosening, infection, stiffness, patella tracking problems, thromboembolic complications and other imponderables were discussed. The patient acknowledged the explanation, agreed to proceed with the plan and consent was signed. Patient is being admitted for inpatient treatment for surgery, pain control, PT, OT, prophylactic antibiotics, VTE prophylaxis, progressive ambulation and ADL's and discharge planning. The patient is planning to be discharged home.  Therapy Plans: outpatient therapy at [facility] Disposition: Home with husband Planned DVT Prophylaxis: aspirin 325mg  BID DME needed: none PCP: Dr. Kela Millin TXA: IV Allergies: NKDA Anesthesia Concerns: none BMI: 24.8  - Patient was instructed on what medications to stop prior to surgery. - Follow-up visit in 2 weeks with Dr. Wynelle Link - Begin physical  therapy following surgery - Pre-operative lab work as pre-surgical testing - Prescriptions will be provided in hospital at time of discharge  Theresa Duty, PA-C Orthopedic Surgery [facility] [place]

## 2018-07-30 ENCOUNTER — Encounter (INDEPENDENT_AMBULATORY_CARE_PROVIDER_SITE_OTHER): Payer: Medicare Other | Admitting: Ophthalmology

## 2018-07-30 ENCOUNTER — Other Ambulatory Visit: Payer: Self-pay

## 2018-07-30 DIAGNOSIS — H35033 Hypertensive retinopathy, bilateral: Secondary | ICD-10-CM | POA: Diagnosis not present

## 2018-07-30 DIAGNOSIS — H2512 Age-related nuclear cataract, left eye: Secondary | ICD-10-CM | POA: Diagnosis not present

## 2018-07-30 DIAGNOSIS — H353122 Nonexudative age-related macular degeneration, left eye, intermediate dry stage: Secondary | ICD-10-CM | POA: Diagnosis not present

## 2018-07-30 DIAGNOSIS — I10 Essential (primary) hypertension: Secondary | ICD-10-CM | POA: Diagnosis not present

## 2018-07-30 DIAGNOSIS — H43813 Vitreous degeneration, bilateral: Secondary | ICD-10-CM | POA: Diagnosis not present

## 2018-07-30 DIAGNOSIS — H353211 Exudative age-related macular degeneration, right eye, with active choroidal neovascularization: Secondary | ICD-10-CM

## 2018-07-31 NOTE — Progress Notes (Signed)
07/16/2018- Medical Clearance from Dr. Ashby Dawes on chart.

## 2018-07-31 NOTE — Patient Instructions (Addendum)
Amy Carroll  07/31/2018   Your procedure is scheduled on: Wednesday 08/06/2018  Report to [facility] Main  Entrance              Report to Admitting at  1245  PM               YOU NEED TO HAVE A COVID 19 TEST ON 08-04-18  @ 3:00 PM, THIS TEST MUST BE DONE BEFORE SURGERY, COME TO [place] ENTRANCE. ONCE YOUR TEST IS COMPLETED, PLEASE BEGIN THE QUARANTINE INSTRUCTIONS AS OUTLINED IN YOUR HANDOUT.   Call this number if you have problems the morning of surgery [phone]    Remember: Do not eat food or drink liquids :After Midnight.               NO SOLID FOOD AFTER MIDNIGHT THE NIGHT PRIOR TO SURGERY. NOTHING BY MOUTH EXCEPT CLEAR LIQUIDS UNTIL 0915 am..              PLEASE FINISH ENSURE DRINK PER SURGEON ORDER  WHICH NEEDS TO BE COMPLETED AT   0430 am.   CLEAR LIQUID DIET   Foods Allowed                                                                     Foods Excluded  Coffee and tea, regular and decaf                             liquids that you cannot  Plain Jell-O in any flavor                                             see through such as: Fruit ices (not with fruit pulp)                                     milk, soups, orange juice  Iced Popsicles                                    All solid food Carbonated beverages, regular and diet                                    Cranberry, grape and apple juices Sports drinks like Gatorade Lightly seasoned clear broth or consume(fat free) Sugar, honey syrup  Sample Menu Breakfast                                Lunch                                     Supper Cranberry juice  Beef broth                            Chicken broth Jell-O                                     Grape juice                           Apple juice Coffee or tea                        Jell-O                                      Popsicle                                                 Coffee or tea                        Coffee or tea  _____________________________________________________________________               BRUSH YOUR TEETH MORNING OF SURGERY AND RINSE YOUR MOUTH OUT, NO CHEWING GUM CANDY OR MINTS.     Take these medicines the morning of surgery with A SIP OF WATER: Amlodipine (Norvasc). You may bring and use your eye drops                                  You may not have any metal on your body including hair pins and              piercings  Do not wear jewelry, make-up, lotions, powders or perfumes, deodorant             Do not wear nail polish.  Do not shave  48 hours prior to surgery.               Do not bring valuables to the hospital. [place].  Contacts, dentures or bridgework may not be worn into surgery.                    Please read over the following fact sheets you were given: _____________________________________________________________________             [facility] - Preparing for Surgery Before surgery, you can play an important role.  Because skin is not sterile, your skin needs to be as free of germs as possible.  You can reduce the number of germs on your skin by washing with CHG (chlorahexidine gluconate) soap before surgery.  CHG is an antiseptic cleaner which kills germs and bonds with the skin to continue killing germs even after washing. Please DO NOT use if you have an allergy to CHG or antibacterial soaps.  If your skin becomes reddened/irritated stop using the CHG and inform your nurse when you arrive at Short Stay. Do not  shave (including legs and underarms) for at least 48 hours prior to the first CHG shower.  You may shave your face/neck. Please follow these instructions carefully:  1.  Shower with CHG Soap the night before surgery and the  morning of Surgery.  2.  If you choose to wash your hair, wash your hair first as usual with your  normal  shampoo.  3.  After  you shampoo, rinse your hair and body thoroughly to remove the  shampoo.                           4.  Use CHG as you would any other liquid soap.  You can apply chg directly  to the skin and wash                       Gently with a scrungie or clean washcloth.  5.  Apply the CHG Soap to your body ONLY FROM THE NECK DOWN.   Do not use on face/ open                           Wound or open sores. Avoid contact with eyes, ears mouth and genitals (private parts).                       Wash face,  Genitals (private parts) with your normal soap.             6.  Wash thoroughly, paying special attention to the area where your surgery  will be performed.  7.  Thoroughly rinse your body with warm water from the neck down.  8.  DO NOT shower/wash with your normal soap after using and rinsing off  the CHG Soap.                9.  Pat yourself dry with a clean towel.            10.  Wear clean pajamas.            11.  Place clean sheets on your bed the night of your first shower and do not  sleep with pets. Day of Surgery : Do not apply any lotions/deodorants the morning of surgery.  Please wear clean clothes to the hospital/surgery center.  FAILURE TO FOLLOW THESE INSTRUCTIONS MAY RESULT IN THE CANCELLATION OF YOUR SURGERY PATIENT SIGNATURE_________________________________  NURSE SIGNATURE__________________________________  ________________________________________________________________________   Adam Phenix  An incentive spirometer is a tool that can help keep your lungs clear and active. This tool measures how well you are filling your lungs with each breath. Taking long deep breaths may help reverse or decrease the chance of developing breathing (pulmonary) problems (especially infection) following:  A long period of time when you are unable to move or be active. BEFORE THE PROCEDURE   If the spirometer includes an indicator to show your best effort, your nurse or respiratory therapist  will set it to a desired goal.  If possible, sit up straight or lean slightly forward. Try not to slouch.  Hold the incentive spirometer in an upright position. INSTRUCTIONS FOR USE  1. Sit on the edge of your bed if possible, or sit up as far as you can in bed or on a chair. 2. Hold the incentive spirometer in an upright position. 3. Breathe out normally. 4. Place the mouthpiece in your  mouth and seal your lips tightly around it. 5. Breathe in slowly and as deeply as possible, raising the piston or the ball toward the top of the column. 6. Hold your breath for 3-5 seconds or for as long as possible. Allow the piston or ball to fall to the bottom of the column. 7. Remove the mouthpiece from your mouth and breathe out normally. 8. Rest for a few seconds and repeat Steps 1 through 7 at least 10 times every 1-2 hours when you are awake. Take your time and take a few normal breaths between deep breaths. 9. The spirometer may include an indicator to show your best effort. Use the indicator as a goal to work toward during each repetition. 10. After each set of 10 deep breaths, practice coughing to be sure your lungs are clear. If you have an incision (the cut made at the time of surgery), support your incision when coughing by placing a pillow or rolled up towels firmly against it. Once you are able to get out of bed, walk around indoors and cough well. You may stop using the incentive spirometer when instructed by your caregiver.  RISKS AND COMPLICATIONS  Take your time so you do not get dizzy or light-headed.  If you are in pain, you may need to take or ask for pain medication before doing incentive spirometry. It is harder to take a deep breath if you are having pain. AFTER USE  Rest and breathe slowly and easily.  It can be helpful to keep track of a log of your progress. Your caregiver can provide you with a simple table to help with this. If you are using the spirometer at home, follow  these instructions: [place] IF:   You are having difficultly using the spirometer.  You have trouble using the spirometer as often as instructed.  Your pain medication is not giving enough relief while using the spirometer.  You develop fever of 100.5 F (38.1 C) or higher. SEEK IMMEDIATE MEDICAL CARE IF:   You cough up bloody sputum that had not been present before.  You develop fever of 102 F (38.9 C) or greater.  You develop worsening pain at or near the incision site. MAKE SURE YOU:   Understand these instructions.  Will watch your condition.  Will get help right away if you are not doing well or get worse. Document Released: 06/25/2006 Document Revised: 05/07/2011 Document Reviewed: 08/26/2006 ExitCare Patient Information 2014 ExitCare, [place].   ________________________________________________________________________  WHAT IS A BLOOD TRANSFUSION? Blood Transfusion Information  A transfusion is the replacement of blood or some of its parts. Blood is made up of multiple cells which provide different functions.  Red blood cells carry oxygen and are used for blood loss replacement.  White blood cells fight against infection.  Platelets control bleeding.  Plasma helps clot blood.  Other blood products are available for specialized needs, such as hemophilia or other clotting disorders. BEFORE THE TRANSFUSION  Who gives blood for transfusions?   Healthy volunteers who are fully evaluated to make sure their blood is safe. This is blood bank blood. Transfusion therapy is the safest it has ever been in the practice of medicine. Before blood is taken from a donor, a complete history is taken to make sure that person has no history of diseases nor engages in risky social behavior (examples are intravenous drug use or sexual activity with multiple partners). The donor's travel history is screened to minimize risk of transmitting  infections, such as malaria. The  donated blood is tested for signs of infectious diseases, such as HIV and hepatitis. The blood is then tested to be sure it is compatible with you in order to minimize the chance of a transfusion reaction. If you or a relative donates blood, this is often done in anticipation of surgery and is not appropriate for emergency situations. It takes many days to process the donated blood. RISKS AND COMPLICATIONS Although transfusion therapy is very safe and saves many lives, the main dangers of transfusion include:   Getting an infectious disease.  Developing a transfusion reaction. This is an allergic reaction to something in the blood you were given. Every precaution is taken to prevent this. The decision to have a blood transfusion has been considered carefully by your caregiver before blood is given. Blood is not given unless the benefits outweigh the risks. AFTER THE TRANSFUSION  Right after receiving a blood transfusion, you will usually feel much better and more energetic. This is especially true if your red blood cells have gotten low (anemic). The transfusion raises the level of the red blood cells which carry oxygen, and this usually causes an energy increase.  The nurse administering the transfusion will monitor you carefully for complications. HOME CARE INSTRUCTIONS  No special instructions are needed after a transfusion. You may find your energy is better. Speak with your caregiver about any limitations on activity for underlying diseases you may have. SEEK MEDICAL CARE IF:   Your condition is not improving after your transfusion.  You develop redness or irritation at the intravenous (IV) site. SEEK IMMEDIATE MEDICAL CARE IF:  Any of the following symptoms occur over the next 12 hours:  Shaking chills.  You have a temperature by mouth above 102 F (38.9 C), not controlled by medicine.  Chest, back, or muscle pain.  People around you feel you are not acting correctly or are  confused.  Shortness of breath or difficulty breathing.  Dizziness and fainting.  You get a rash or develop hives.  You have a decrease in urine output.  Your urine turns a dark color or changes to pink, red, or brown. Any of the following symptoms occur over the next 10 days:  You have a temperature by mouth above 102 F (38.9 C), not controlled by medicine.  Shortness of breath.  Weakness after normal activity.  The white part of the eye turns yellow (jaundice).  You have a decrease in the amount of urine or are urinating less often.  Your urine turns a dark color or changes to pink, red, or brown. Document Released: 02/10/2000 Document Revised: 05/07/2011 Document Reviewed: 09/29/2007 [facility] Patient Information 2014 [place], [place].  _______________________________________________________________________

## 2018-08-01 ENCOUNTER — Other Ambulatory Visit: Payer: Self-pay

## 2018-08-01 ENCOUNTER — Encounter (HOSPITAL_COMMUNITY): Payer: Self-pay

## 2018-08-01 ENCOUNTER — Encounter (HOSPITAL_COMMUNITY)
Admission: RE | Admit: 2018-08-01 | Discharge: 2018-08-01 | Disposition: A | Discharge status: Home / Self Care | Payer: Medicare Other | Source: Ambulatory Visit | Attending: Orthopedic Surgery | Admitting: Orthopedic Surgery

## 2018-08-01 DIAGNOSIS — I119 Hypertensive heart disease without heart failure: Secondary | ICD-10-CM | POA: Insufficient documentation

## 2018-08-01 DIAGNOSIS — Z1159 Encounter for screening for other viral diseases: Secondary | ICD-10-CM | POA: Insufficient documentation

## 2018-08-01 DIAGNOSIS — Z01818 Encounter for other preprocedural examination: Secondary | ICD-10-CM | POA: Diagnosis not present

## 2018-08-01 DIAGNOSIS — M009 Pyogenic arthritis, unspecified: Secondary | ICD-10-CM | POA: Diagnosis not present

## 2018-08-01 LAB — CBC
HCT: 37.1 % (ref 36.0–46.0)
Hemoglobin: 11.8 g/dL — ABNORMAL LOW (ref 12.0–15.0)
MCH: 32 pg (ref 26.0–34.0)
MCHC: 31.8 g/dL (ref 30.0–36.0)
MCV: 100.5 fL — ABNORMAL HIGH (ref 80.0–100.0)
Platelets: 303 10*3/uL (ref 150–400)
RBC: 3.69 MIL/uL — ABNORMAL LOW (ref 3.87–5.11)
RDW: 12.4 % (ref 11.5–15.5)
WBC: 5.1 10*3/uL (ref 4.0–10.5)
nRBC: 0 % (ref 0.0–0.2)

## 2018-08-01 LAB — COMPREHENSIVE METABOLIC PANEL
ALT: 17 U/L (ref 0–44)
AST: 14 U/L — ABNORMAL LOW (ref 15–41)
Albumin: 3.8 g/dL (ref 3.5–5.0)
Alkaline Phosphatase: 74 U/L (ref 38–126)
Anion gap: 8 (ref 5–15)
BUN: 43 mg/dL — ABNORMAL HIGH (ref 8–23)
CO2: 26 mmol/L (ref 22–32)
Calcium: 8.7 mg/dL — ABNORMAL LOW (ref 8.9–10.3)
Chloride: 102 mmol/L (ref 98–111)
Creatinine, Ser: 0.95 mg/dL (ref 0.44–1.00)
GFR calc Af Amer: >60 mL/min (ref >60)
GFR calc non Af Amer: 56 mL/min — ABNORMAL LOW (ref >60)
Glucose, Bld: 100 mg/dL — ABNORMAL HIGH (ref 70–99)
Potassium: 3.9 mmol/L (ref 3.5–5.1)
Sodium: 136 mmol/L (ref 135–145)
Total Bilirubin: 0.7 mg/dL (ref 0.3–1.2)
Total Protein: 7.7 g/dL (ref 6.5–8.1)

## 2018-08-01 LAB — APTT: aPTT: 36 seconds (ref 24–36)

## 2018-08-01 LAB — PROTIME-INR
INR: 0.9 (ref 0.8–1.2)
Prothrombin Time: 12.1 seconds (ref 11.4–15.2)

## 2018-08-01 LAB — SURGICAL PCR SCREEN
MRSA, PCR: NEGATIVE
Staphylococcus aureus: NEGATIVE

## 2018-08-02 LAB — ABO/RH: ABO/RH(D): A POS

## 2018-08-04 ENCOUNTER — Other Ambulatory Visit (HOSPITAL_COMMUNITY)
Admission: RE | Admit: 2018-08-04 | Discharge: 2018-08-04 | Disposition: A | Discharge status: Home / Self Care | Payer: Medicare Other | Source: Ambulatory Visit | Attending: Orthopedic Surgery | Admitting: Orthopedic Surgery

## 2018-08-04 LAB — SARS CORONAVIRUS 2 BY RT PCR (HOSPITAL ORDER, PERFORMED IN ~~LOC~~ HOSPITAL LAB): SARS Coronavirus 2: NEGATIVE

## 2018-08-05 MED ORDER — BUPIVACAINE LIPOSOME 1.3 % IJ SUSP
20.0000 mL | Freq: Once | INTRAMUSCULAR | Status: DC
  Filled 2018-08-05: qty 20

## 2018-08-05 NOTE — Progress Notes (Signed)
SPOKE W/  _patient     SCREENING SYMPTOMS OF COVID 19:   COUGH--no  RUNNY NOSE--- no  SORE THROAT---no  NASAL CONGESTION----no  SNEEZING----no  SHORTNESS OF BREATH---no  DIFFICULTY BREATHING---no  TEMP >100.0 -----no  UNEXPLAINED BODY ACHES------no  CHILLS --------no   HEADACHES ---------no  LOSS OF SMELL/ TASTE --------no    HAVE YOU OR ANY FAMILY MEMBER TRAVELLED PAST 14 DAYS OUT OF THE   COUNTY---no STATE----no COUNTRY----no  HAVE YOU OR ANY FAMILY MEMBER BEEN EXPOSED TO ANYONE WITH COVID 19? no    

## 2018-08-06 ENCOUNTER — Encounter (HOSPITAL_COMMUNITY): Payer: Self-pay | Admitting: General Practice

## 2018-08-06 ENCOUNTER — Encounter: Payer: Self-pay | Admitting: Orthopedic Surgery

## 2018-08-06 ENCOUNTER — Other Ambulatory Visit: Payer: Self-pay

## 2018-08-06 ENCOUNTER — Inpatient Hospital Stay (HOSPITAL_COMMUNITY): Payer: Medicare Other | Admitting: Physician Assistant

## 2018-08-06 ENCOUNTER — Inpatient Hospital Stay (HOSPITAL_COMMUNITY): Payer: Medicare Other | Admitting: Anesthesiology

## 2018-08-06 ENCOUNTER — Encounter (HOSPITAL_COMMUNITY)
Admission: RE | Disposition: A | Discharge status: Home Health | Payer: Self-pay | Source: Home / Self Care | Attending: Orthopedic Surgery

## 2018-08-06 ENCOUNTER — Telehealth (HOSPITAL_COMMUNITY): Payer: Self-pay | Admitting: *Deleted

## 2018-08-06 ENCOUNTER — Inpatient Hospital Stay (HOSPITAL_COMMUNITY)
Admission: RE | Admit: 2018-08-06 | Discharge: 2018-08-08 | DRG: 468 | Disposition: A | Discharge status: Home Health | Payer: Medicare Other | Attending: Orthopedic Surgery | Admitting: Orthopedic Surgery

## 2018-08-06 DIAGNOSIS — Z809 Family history of malignant neoplasm, unspecified: Secondary | ICD-10-CM | POA: Diagnosis not present

## 2018-08-06 DIAGNOSIS — T84012A Broken internal right knee prosthesis, initial encounter: Secondary | ICD-10-CM | POA: Diagnosis not present

## 2018-08-06 DIAGNOSIS — Y792 Prosthetic and other implants, materials and accessory orthopedic devices associated with adverse incidents: Secondary | ICD-10-CM | POA: Diagnosis present

## 2018-08-06 DIAGNOSIS — T8453XA Infection and inflammatory reaction due to internal right knee prosthesis, initial encounter: Secondary | ICD-10-CM | POA: Diagnosis not present

## 2018-08-06 DIAGNOSIS — Z96651 Presence of right artificial knee joint: Secondary | ICD-10-CM | POA: Diagnosis not present

## 2018-08-06 DIAGNOSIS — T84092A Other mechanical complication of internal right knee prosthesis, initial encounter: Principal | ICD-10-CM | POA: Diagnosis present

## 2018-08-06 DIAGNOSIS — T84018A Broken internal joint prosthesis, other site, initial encounter: Secondary | ICD-10-CM

## 2018-08-06 DIAGNOSIS — Z825 Family history of asthma and other chronic lower respiratory diseases: Secondary | ICD-10-CM

## 2018-08-06 DIAGNOSIS — M25561 Pain in right knee: Secondary | ICD-10-CM | POA: Diagnosis not present

## 2018-08-06 DIAGNOSIS — Z8249 Family history of ischemic heart disease and other diseases of the circulatory system: Secondary | ICD-10-CM

## 2018-08-06 DIAGNOSIS — G8918 Other acute postprocedural pain: Secondary | ICD-10-CM | POA: Diagnosis not present

## 2018-08-06 DIAGNOSIS — Z87891 Personal history of nicotine dependence: Secondary | ICD-10-CM

## 2018-08-06 DIAGNOSIS — Z96659 Presence of unspecified artificial knee joint: Secondary | ICD-10-CM

## 2018-08-06 DIAGNOSIS — I10 Essential (primary) hypertension: Secondary | ICD-10-CM | POA: Diagnosis present

## 2018-08-06 HISTORY — PX: TOTAL KNEE REVISION: SHX996

## 2018-08-06 LAB — TYPE AND SCREEN
ABO/RH(D): A POS
Antibody Screen: NEGATIVE

## 2018-08-06 SURGERY — TOTAL KNEE REVISION
Anesthesia: Spinal | Laterality: Right

## 2018-08-06 MED ORDER — FLEET ENEMA 7-19 GM/118ML RE ENEM: 1.0000 | ENEMA | Freq: Once | RECTAL | Status: DC | PRN

## 2018-08-06 MED ORDER — SODIUM CHLORIDE (PF) 0.9 % IJ SOLN
INTRAMUSCULAR | Status: AC
  Filled 2018-08-06: qty 50

## 2018-08-06 MED ORDER — ONDANSETRON HCL 4 MG/2ML IJ SOLN: 4.0000 mg | Freq: Four times a day (QID) | INTRAMUSCULAR | Status: DC | PRN

## 2018-08-06 MED ORDER — FENTANYL CITRATE (PF) 100 MCG/2ML IJ SOLN
INTRAMUSCULAR | Status: AC
  Filled 2018-08-06: qty 2

## 2018-08-06 MED ORDER — MORPHINE SULFATE (PF) 2 MG/ML IV SOLN
1.0000 mg | INTRAVENOUS | Status: DC | PRN
  Administered 2018-08-06: 1 mg via INTRAVENOUS
  Filled 2018-08-06: qty 1

## 2018-08-06 MED ORDER — MIDAZOLAM HCL 2 MG/2ML IJ SOLN: 1.0000 mg | INTRAMUSCULAR | Status: DC

## 2018-08-06 MED ORDER — AMLODIPINE BESYLATE 5 MG PO TABS
5.0000 mg | ORAL_TABLET | Freq: Every day | ORAL | Status: DC
  Administered 2018-08-07 – 2018-08-08 (×2): 5 mg via ORAL
  Filled 2018-08-06 (×2): qty 1

## 2018-08-06 MED ORDER — SODIUM CHLORIDE 0.9 % IR SOLN
Status: DC | PRN
  Administered 2018-08-06: 1000 mL

## 2018-08-06 MED ORDER — METHOCARBAMOL 500 MG PO TABS
500.0000 mg | ORAL_TABLET | Freq: Four times a day (QID) | ORAL | Status: DC | PRN
  Administered 2018-08-06 – 2018-08-08 (×3): 500 mg via ORAL
  Filled 2018-08-06 (×3): qty 1

## 2018-08-06 MED ORDER — BUPIVACAINE HCL (PF) 0.25 % IJ SOLN
INTRAMUSCULAR | Status: AC
  Filled 2018-08-06: qty 30

## 2018-08-06 MED ORDER — TIMOLOL MALEATE 0.5 % OP SOLN
1.0000 [drp] | Freq: Two times a day (BID) | OPHTHALMIC | Status: DC
  Administered 2018-08-06 – 2018-08-07 (×3): 1 [drp] via OPHTHALMIC
  Filled 2018-08-06: qty 5

## 2018-08-06 MED ORDER — BUPIVACAINE LIPOSOME 1.3 % IJ SUSP
INTRAMUSCULAR | Status: DC | PRN
  Administered 2018-08-06: 20 mL

## 2018-08-06 MED ORDER — CHLORHEXIDINE GLUCONATE 4 % EX LIQD: 60.0000 mL | Freq: Once | CUTANEOUS | Status: DC

## 2018-08-06 MED ORDER — ONDANSETRON HCL 4 MG PO TABS: 4.0000 mg | ORAL_TABLET | Freq: Four times a day (QID) | ORAL | Status: DC | PRN

## 2018-08-06 MED ORDER — METHOCARBAMOL 500 MG IVPB - SIMPLE MED
500.0000 mg | Freq: Four times a day (QID) | INTRAVENOUS | Status: DC | PRN
  Filled 2018-08-06: qty 50

## 2018-08-06 MED ORDER — HYDROMORPHONE HCL 1 MG/ML IJ SOLN: 0.2500 mg | INTRAMUSCULAR | Status: DC | PRN

## 2018-08-06 MED ORDER — BRIMONIDINE TARTRATE 0.2 % OP SOLN
1.0000 [drp] | Freq: Two times a day (BID) | OPHTHALMIC | Status: DC
  Administered 2018-08-06 – 2018-08-07 (×3): 1 [drp] via OPHTHALMIC
  Filled 2018-08-06: qty 5

## 2018-08-06 MED ORDER — ACETAMINOPHEN 10 MG/ML IV SOLN
1000.0000 mg | Freq: Four times a day (QID) | INTRAVENOUS | Status: DC
  Administered 2018-08-06: 1000 mg via INTRAVENOUS
  Filled 2018-08-06: qty 100

## 2018-08-06 MED ORDER — STERILE WATER FOR IRRIGATION IR SOLN
Status: DC | PRN
  Administered 2018-08-06: 2000 mL

## 2018-08-06 MED ORDER — 0.9 % SODIUM CHLORIDE (POUR BTL) OPTIME
TOPICAL | Status: DC | PRN
  Administered 2018-08-06: 14:00:00 1000 mL

## 2018-08-06 MED ORDER — DORZOLAMIDE HCL 2 % OP SOLN
1.0000 [drp] | Freq: Two times a day (BID) | OPHTHALMIC | Status: DC
  Administered 2018-08-06 – 2018-08-07 (×3): 1 [drp] via OPHTHALMIC
  Filled 2018-08-06: qty 10

## 2018-08-06 MED ORDER — LACTATED RINGERS IV SOLN
INTRAVENOUS | Status: DC
  Administered 2018-08-06 (×2): via INTRAVENOUS

## 2018-08-06 MED ORDER — TRAMADOL HCL 50 MG PO TABS: 50.0000 mg | ORAL_TABLET | Freq: Four times a day (QID) | ORAL | Status: DC | PRN

## 2018-08-06 MED ORDER — SODIUM CHLORIDE 0.9 % IV SOLN
INTRAVENOUS | Status: DC | PRN
  Administered 2018-08-06: 30 ug/min via INTRAVENOUS

## 2018-08-06 MED ORDER — DEXAMETHASONE SODIUM PHOSPHATE 10 MG/ML IJ SOLN
8.0000 mg | Freq: Once | INTRAMUSCULAR | Status: AC
  Administered 2018-08-06: 10 mg via INTRAVENOUS

## 2018-08-06 MED ORDER — DIPHENHYDRAMINE HCL 12.5 MG/5ML PO ELIX
12.5000 mg | ORAL_SOLUTION | ORAL | Status: DC | PRN
  Administered 2018-08-07: 25 mg via ORAL
  Filled 2018-08-06: qty 10

## 2018-08-06 MED ORDER — POVIDONE-IODINE 10 % EX SWAB: 2.0000 "application " | Freq: Once | CUTANEOUS | Status: DC

## 2018-08-06 MED ORDER — CEFAZOLIN SODIUM-DEXTROSE 2-4 GM/100ML-% IV SOLN
2.0000 g | Freq: Four times a day (QID) | INTRAVENOUS | Status: AC
  Administered 2018-08-06 – 2018-08-07 (×2): 2 g via INTRAVENOUS
  Filled 2018-08-06 (×2): qty 100

## 2018-08-06 MED ORDER — POLYETHYLENE GLYCOL 3350 17 G PO PACK: 17.0000 g | PACK | Freq: Every day | ORAL | Status: DC | PRN

## 2018-08-06 MED ORDER — OXYCODONE HCL 5 MG PO TABS
5.0000 mg | ORAL_TABLET | ORAL | Status: DC | PRN
  Administered 2018-08-07 – 2018-08-08 (×3): 5 mg via ORAL
  Filled 2018-08-06 (×3): qty 1

## 2018-08-06 MED ORDER — PROMETHAZINE HCL 25 MG/ML IJ SOLN: 6.2500 mg | INTRAMUSCULAR | Status: DC | PRN

## 2018-08-06 MED ORDER — DOCUSATE SODIUM 100 MG PO CAPS
100.0000 mg | ORAL_CAPSULE | Freq: Two times a day (BID) | ORAL | Status: DC
  Administered 2018-08-06 – 2018-08-08 (×3): 100 mg via ORAL
  Filled 2018-08-06 (×4): qty 1

## 2018-08-06 MED ORDER — DEXAMETHASONE SODIUM PHOSPHATE 10 MG/ML IJ SOLN
10.0000 mg | Freq: Once | INTRAMUSCULAR | Status: AC
  Administered 2018-08-07: 10 mg via INTRAVENOUS
  Filled 2018-08-06: qty 1

## 2018-08-06 MED ORDER — FENTANYL CITRATE (PF) 100 MCG/2ML IJ SOLN
50.0000 ug | INTRAMUSCULAR | Status: DC
  Administered 2018-08-06: 75 ug via INTRAVENOUS

## 2018-08-06 MED ORDER — METOCLOPRAMIDE HCL 5 MG/ML IJ SOLN: 5.0000 mg | Freq: Three times a day (TID) | INTRAMUSCULAR | Status: DC | PRN

## 2018-08-06 MED ORDER — MIDAZOLAM HCL 2 MG/2ML IJ SOLN
INTRAMUSCULAR | Status: AC
  Filled 2018-08-06: qty 2

## 2018-08-06 MED ORDER — SODIUM CHLORIDE (PF) 0.9 % IJ SOLN
INTRAMUSCULAR | Status: AC
  Filled 2018-08-06: qty 10

## 2018-08-06 MED ORDER — PHENOL 1.4 % MT LIQD
1.0000 | OROMUCOSAL | Status: DC | PRN
  Filled 2018-08-06: qty 177

## 2018-08-06 MED ORDER — OLMESARTAN MEDOXOMIL-HCTZ 40-25 MG PO TABS: 1.0000 | ORAL_TABLET | Freq: Every day | ORAL | Status: DC

## 2018-08-06 MED ORDER — IRBESARTAN 150 MG PO TABS
300.0000 mg | ORAL_TABLET | Freq: Every day | ORAL | Status: DC
  Administered 2018-08-07 – 2018-08-08 (×2): 300 mg via ORAL
  Filled 2018-08-06 (×2): qty 2

## 2018-08-06 MED ORDER — MENTHOL 3 MG MT LOZG: 1.0000 | LOZENGE | OROMUCOSAL | Status: DC | PRN

## 2018-08-06 MED ORDER — TRANEXAMIC ACID-NACL 1000-0.7 MG/100ML-% IV SOLN
1000.0000 mg | INTRAVENOUS | Status: AC
  Administered 2018-08-06: 1000 mg via INTRAVENOUS
  Filled 2018-08-06: qty 100

## 2018-08-06 MED ORDER — SODIUM CHLORIDE (PF) 0.9 % IJ SOLN
INTRAMUSCULAR | Status: DC | PRN
  Administered 2018-08-06: 60 mL

## 2018-08-06 MED ORDER — ROPIVACAINE HCL 7.5 MG/ML IJ SOLN
INTRAMUSCULAR | Status: DC | PRN
  Administered 2018-08-06: 20 mL via PERINEURAL

## 2018-08-06 MED ORDER — LATANOPROST 0.005 % OP SOLN
1.0000 [drp] | Freq: Every day | OPHTHALMIC | Status: DC
  Administered 2018-08-06 – 2018-08-07 (×2): 1 [drp] via OPHTHALMIC
  Filled 2018-08-06: qty 2.5

## 2018-08-06 MED ORDER — SODIUM CHLORIDE 0.9 % IV SOLN
INTRAVENOUS | Status: DC
  Administered 2018-08-06: 18:00:00 via INTRAVENOUS

## 2018-08-06 MED ORDER — PROPOFOL 10 MG/ML IV BOLUS
INTRAVENOUS | Status: AC
  Filled 2018-08-06: qty 60

## 2018-08-06 MED ORDER — TRANEXAMIC ACID-NACL 1000-0.7 MG/100ML-% IV SOLN
1000.0000 mg | Freq: Once | INTRAVENOUS | Status: AC
  Administered 2018-08-06: 18:00:00 1000 mg via INTRAVENOUS
  Filled 2018-08-06: qty 100

## 2018-08-06 MED ORDER — METOCLOPRAMIDE HCL 5 MG PO TABS: 5.0000 mg | ORAL_TABLET | Freq: Three times a day (TID) | ORAL | Status: DC | PRN

## 2018-08-06 MED ORDER — BISACODYL 10 MG RE SUPP: 10.0000 mg | Freq: Every day | RECTAL | Status: DC | PRN

## 2018-08-06 MED ORDER — CLONIDINE HCL (ANALGESIA) 100 MCG/ML EP SOLN
EPIDURAL | Status: DC | PRN
  Administered 2018-08-06: 70 ug

## 2018-08-06 MED ORDER — ACETAMINOPHEN 500 MG PO TABS
1000.0000 mg | ORAL_TABLET | Freq: Four times a day (QID) | ORAL | Status: AC
  Administered 2018-08-06 – 2018-08-07 (×2): 1000 mg via ORAL
  Administered 2018-08-07: 500 mg via ORAL
  Administered 2018-08-07: 06:00:00 1000 mg via ORAL
  Filled 2018-08-06 (×4): qty 2

## 2018-08-06 MED ORDER — PROPOFOL 500 MG/50ML IV EMUL
INTRAVENOUS | Status: DC | PRN
  Administered 2018-08-06: 75 ug/kg/min via INTRAVENOUS

## 2018-08-06 MED ORDER — ASPIRIN EC 325 MG PO TBEC
325.0000 mg | DELAYED_RELEASE_TABLET | Freq: Two times a day (BID) | ORAL | Status: DC
  Administered 2018-08-07 – 2018-08-08 (×3): 325 mg via ORAL
  Filled 2018-08-06 (×3): qty 1

## 2018-08-06 MED ORDER — HYDROCHLOROTHIAZIDE 25 MG PO TABS
25.0000 mg | ORAL_TABLET | Freq: Every day | ORAL | Status: DC
  Administered 2018-08-07 – 2018-08-08 (×2): 25 mg via ORAL
  Filled 2018-08-06 (×2): qty 1

## 2018-08-06 MED ORDER — ONDANSETRON HCL 4 MG/2ML IJ SOLN
INTRAMUSCULAR | Status: DC | PRN
  Administered 2018-08-06: 4 mg via INTRAVENOUS

## 2018-08-06 MED ORDER — CEFAZOLIN SODIUM-DEXTROSE 2-4 GM/100ML-% IV SOLN
2.0000 g | INTRAVENOUS | Status: AC
  Administered 2018-08-06: 2 g via INTRAVENOUS
  Filled 2018-08-06: qty 100

## 2018-08-06 MED ORDER — BRIMONIDINE TARTRATE-TIMOLOL 0.2-0.5 % OP SOLN: 1.0000 [drp] | Freq: Two times a day (BID) | OPHTHALMIC | Status: DC

## 2018-08-06 MED ORDER — PROPOFOL 500 MG/50ML IV EMUL
INTRAVENOUS | Status: DC | PRN
  Administered 2018-08-06: 30 mg via INTRAVENOUS

## 2018-08-06 SURGICAL SUPPLY — 72 items
ADAPTER BOLT FEMORAL +2/-2 (Knees) ×2 IMPLANT
BAG DECANTER FOR FLEXI CONT (MISCELLANEOUS) ×2 IMPLANT
BAG ZIPLOCK 12X15 (MISCELLANEOUS) IMPLANT
BANDAGE ACE 6X5 VEL STRL LF (GAUZE/BANDAGES/DRESSINGS) ×2 IMPLANT
BLADE SAG 18X100X1.27 (BLADE) ×2 IMPLANT
BLADE SAW SGTL 11.0X1.19X90.0M (BLADE) ×2 IMPLANT
BLADE SURG SZ10 CARB STEEL (BLADE) ×4 IMPLANT
BNDG ELASTIC 6X10 VLCR STRL LF (GAUZE/BANDAGES/DRESSINGS) ×2 IMPLANT
BONE CEMENT GENTAMICIN (Cement) ×6 IMPLANT
CEMENT BONE GENTAMICIN 40 (Cement) ×3 IMPLANT
CEMENT RESTRICTOR DEPUY SZ 4 (Cement) ×2 IMPLANT
CLOTH BEACON ORANGE TIMEOUT ST (SAFETY) ×2 IMPLANT
COVER SURGICAL LIGHT HANDLE (MISCELLANEOUS) ×2 IMPLANT
COVER WAND RF STERILE (DRAPES) IMPLANT
CUFF TOURN SGL QUICK 34 (TOURNIQUET CUFF) ×1
CUFF TRNQT CYL 34X4.125X (TOURNIQUET CUFF) ×1 IMPLANT
DECANTER SPIKE VIAL GLASS SM (MISCELLANEOUS) IMPLANT
DRAPE U-SHAPE 47X51 STRL (DRAPES) ×2 IMPLANT
DRSG ADAPTIC 3X8 NADH LF (GAUZE/BANDAGES/DRESSINGS) ×2 IMPLANT
DRSG EMULSION OIL 3X16 NADH (GAUZE/BANDAGES/DRESSINGS) ×2 IMPLANT
DRSG PAD ABDOMINAL 8X10 ST (GAUZE/BANDAGES/DRESSINGS) ×2 IMPLANT
DURAPREP 26ML APPLICATOR (WOUND CARE) ×2 IMPLANT
ELECT REM PT RETURN 15FT ADLT (MISCELLANEOUS) ×2 IMPLANT
EVACUATOR 1/8 PVC DRAIN (DRAIN) ×2 IMPLANT
FEM TC3 RT PFC SIGMA SZ2.5 (Orthopedic Implant) ×2 IMPLANT
FEMORAL ADAPTER (Orthopedic Implant) ×2 IMPLANT
FEMORAL TC3 RT PFC SIGMA SZ2.5 (Orthopedic Implant) ×1 IMPLANT
GAUZE SPONGE 4X4 12PLY STRL (GAUZE/BANDAGES/DRESSINGS) ×2 IMPLANT
GLOVE BIO SURGEON STRL SZ7 (GLOVE) ×2 IMPLANT
GLOVE BIO SURGEON STRL SZ8 (GLOVE) ×2 IMPLANT
GLOVE BIOGEL PI IND STRL 7.0 (GLOVE) ×1 IMPLANT
GLOVE BIOGEL PI IND STRL 8 (GLOVE) ×1 IMPLANT
GLOVE BIOGEL PI INDICATOR 7.0 (GLOVE) ×1
GLOVE BIOGEL PI INDICATOR 8 (GLOVE) ×1
GOWN STRL REUS W/TWL LRG LVL3 (GOWN DISPOSABLE) ×4 IMPLANT
HANDPIECE INTERPULSE COAX TIP (DISPOSABLE) ×1
HOLDER FOLEY CATH W/STRAP (MISCELLANEOUS) IMPLANT
IMMOBILIZER KNEE 20 (SOFTGOODS) ×2
IMMOBILIZER KNEE 20 THIGH 36 (SOFTGOODS) ×1 IMPLANT
INSERT TC3 RP TIB  2.5 17.5MM (Knees) ×1 IMPLANT
INSERT TC3 RP TIB 2.5 17.5MM (Knees) ×1 IMPLANT
JET LAVAGE IRRISEPT WOUND (IRRIGATION / IRRIGATOR) ×2
KIT TURNOVER KIT A (KITS) IMPLANT
LAVAGE JET IRRISEPT WOUND (IRRIGATION / IRRIGATOR) ×1 IMPLANT
MANIFOLD NEPTUNE II (INSTRUMENTS) ×2 IMPLANT
NS IRRIG 1000ML POUR BTL (IV SOLUTION) ×2 IMPLANT
PACK TOTAL KNEE CUSTOM (KITS) ×2 IMPLANT
PADDING CAST COTTON 6X4 STRL (CAST SUPPLIES) ×4 IMPLANT
PIN STEINMAN FIXATION KNEE (PIN) ×2 IMPLANT
POST AUG PFC 4MM SZ 2.5 (Knees) ×4 IMPLANT
PROTECTOR NERVE ULNAR (MISCELLANEOUS) ×2 IMPLANT
SET HNDPC FAN SPRY TIP SCT (DISPOSABLE) ×1 IMPLANT
STEM TIBIA PFC 13X30MM (Stem) ×2 IMPLANT
STEM UNIVERSAL REVISION 75X18 (Stem) ×2 IMPLANT
STRIP CLOSURE SKIN 1/2X4 (GAUZE/BANDAGES/DRESSINGS) ×2 IMPLANT
SUT MNCRL AB 4-0 PS2 18 (SUTURE) ×2 IMPLANT
SUT STRATAFIX 0 PDS 27 VIOLET (SUTURE) ×2
SUT VIC AB 2-0 CT1 27 (SUTURE) ×3
SUT VIC AB 2-0 CT1 TAPERPNT 27 (SUTURE) ×3 IMPLANT
SUTURE STRATFX 0 PDS 27 VIOLET (SUTURE) ×1 IMPLANT
SWAB COLLECTION DEVICE MRSA (MISCELLANEOUS) IMPLANT
SWAB CULTURE ESWAB REG 1ML (MISCELLANEOUS) IMPLANT
SYR 50ML LL SCALE MARK (SYRINGE) ×4 IMPLANT
TOWER CARTRIDGE SMART MIX (DISPOSABLE) ×2 IMPLANT
TRAY FOLEY BAG SILVER LF 14FR (CATHETERS) ×2 IMPLANT
TRAY FOLEY MTR SLVR 16FR STAT (SET/KITS/TRAYS/PACK) IMPLANT
TRAY REVISION SZ 2.5 (Knees) ×2 IMPLANT
TRAY SLEEVE CEM ML (Knees) ×2 IMPLANT
TUBE KAMVAC SUCTION (TUBING) IMPLANT
WATER STERILE IRR 1000ML POUR (IV SOLUTION) ×2 IMPLANT
WEDGE STEP 2.5 10MM (Knees) ×4 IMPLANT
WRAP KNEE MAXI GEL POST OP (GAUZE/BANDAGES/DRESSINGS) ×2 IMPLANT

## 2018-08-06 NOTE — Transfer of Care (Signed)
Immediate Anesthesia Transfer of Care Note  Patient: Amy Carroll  Procedure(s) Performed: TOTAL KNEE REVISION (Right )  Patient Location: PACU  Anesthesia Type:Spinal  Level of Consciousness: sedated, patient cooperative and responds to stimulation  Airway & Oxygen Therapy: Patient Spontanous Breathing and Patient connected to face mask oxygen  Post-op Assessment: Report given to RN and Post -op Vital signs reviewed and stable  Post vital signs: Reviewed and stable  Last Vitals:  Vitals Value Taken Time  BP 91/56 08/06/2018  4:01 PM  Temp    Pulse 76 08/06/2018  4:03 PM  Resp 18 08/06/2018  4:03 PM  SpO2 100 % 08/06/2018  4:03 PM  Vitals shown include unvalidated device data.  Last Pain:  Vitals:   08/06/18 1250  TempSrc:   PainSc: 0-No pain         Complications: No apparent anesthesia complications

## 2018-08-06 NOTE — Progress Notes (Signed)
Assisted Dr. Myrtie Soman with Right Knee Adductor canal block. Side rails up, monitors on throughout procedure. See vital signs in flow sheet. Tolerated Procedure well.

## 2018-08-06 NOTE — Anesthesia Procedure Notes (Signed)
Spinal  Start time: 08/06/2018 1:55 PM End time: 08/06/2018 2:00 PM Staffing Resident/CRNA: Gean Maidens, CRNA Performed: resident/CRNA  Preanesthetic Checklist Completed: patient identified, site marked, surgical consent, pre-op evaluation, timeout performed, IV checked, risks and benefits discussed and monitors and equipment checked Spinal Block Patient position: sitting Prep: DuraPrep Patient monitoring: heart rate, continuous pulse ox and blood pressure Approach: midline Location: L3-4 Injection technique: single-shot Needle Needle type: Pencan  Needle gauge: 24 G Needle length: 9 cm Needle insertion depth: 6 cm Additional Notes Pt sitting position, sterile prep and drape, negative paresthesia/heme

## 2018-08-06 NOTE — Telephone Encounter (Signed)
Got it.

## 2018-08-06 NOTE — Brief Op Note (Signed)
08/06/2018  3:35 PM  PATIENT:  Amy Carroll  84 y.o. female  PRE-OPERATIVE DIAGNOSIS:  septic arthritis right knee  POST-OPERATIVE DIAGNOSIS:  septic arthritis right knee  PROCEDURE:  Procedure(s): TOTAL KNEE REVISION (Right)  SURGEON:  Surgeon(s) and Role:    Gaynelle Arabian, MD - Primary  PHYSICIAN ASSISTANT:   ASSISTANTS: Griffith Citron, PA-C   ANESTHESIA:   Adductor canal and spinal  EBL:  100 mL   BLOOD ADMINISTERED:none  DRAINS: (Medium) Hemovact drain(s) in the right knee with  Suction Open   LOCAL MEDICATIONS USED:  OTHER Exparel  COUNTS:  YES  TOURNIQUET:  * Missing tourniquet times found for documented tourniquets in log: [id number] * Total Tourniquet Time Documented: Thigh (Right) - 29 minutes Total: Thigh (Right) - 29 minutes   DICTATION: .Other Dictation: Dictation Number [id number]  PLAN OF CARE: Admit to inpatient   PATIENT DISPOSITION:  PACU - hemodynamically stable.

## 2018-08-06 NOTE — Anesthesia Procedure Notes (Signed)
Anesthesia [facility] Block: Adductor canal block   Pre-Anesthetic Checklist: ,, timeout performed, Correct Patient, Correct Site, Correct Laterality, Correct Procedure, Correct Position, site marked, Risks and benefits discussed,  Surgical consent,  Pre-op evaluation,  At surgeon's request and post-op pain management  Laterality: Right  Prep: chloraprep       Needles:  Injection technique: Single-shot  Needle Type: Echogenic Needle     Needle Length: 9cm      Additional Needles:   Procedures:,,,, ultrasound used (permanent image in chart),,,,  Narrative:  Start time: 08/06/2018 1:40 PM End time: 08/06/2018 1:47 PM Injection made incrementally with aspirations every 5 mL.  Performed by: Personally  Anesthesiologist: Myrtie Soman, MD  Additional Notes: Patient tolerated the procedure well without complications

## 2018-08-06 NOTE — Anesthesia Postprocedure Evaluation (Signed)
Anesthesia Post Note  Patient: Amy Carroll  Procedure(s) Performed: TOTAL KNEE REVISION (Right )     Patient location during evaluation: PACU Anesthesia Type: Spinal Level of consciousness: oriented and awake and alert Pain management: pain level controlled Vital Signs Assessment: post-procedure vital signs reviewed and stable Respiratory status: spontaneous breathing, respiratory function stable and patient connected to nasal cannula oxygen Cardiovascular status: blood pressure returned to baseline and stable Postop Assessment: no headache, no backache and no apparent nausea or vomiting Anesthetic complications: no    Last Vitals:  Vitals:   08/06/18 1700 08/06/18 1715  BP: 126/72 110/64  Pulse: 74 74  Resp: 16 15  Temp: (!) 36.3 C (!) 36.3 C  SpO2: 100% 100%    Last Pain:  Vitals:   08/06/18 1715  TempSrc:   PainSc: 0-No pain                 Kailena Lubas S

## 2018-08-06 NOTE — Progress Notes (Signed)
PT Cancellation Note  Patient Details Name: CONI HOMESLEY MRN: [id number] DOB: 09/03/1934   Cancelled Treatment:    Reason Eval/Treat Not Completed: Medical issues which prohibited therapy(spinal has not yet worn off. Will follow. )   Philomena Doheny PT 08/06/2018  Acute Rehabilitation Services Pager [phone] Office [phone]

## 2018-08-06 NOTE — Discharge Instructions (Signed)
Dr. Gaynelle Arabian Total Joint Specialist [facility] [address] [place], [place] [address] [phone]  TOTAL KNEE REVISION POSTOPERATIVE DIRECTIONS  Knee Rehabilitation, Guidelines Following Surgery  Results after knee surgery are often greatly improved when you follow the exercise, range of motion and muscle strengthening exercises prescribed by your doctor. Safety measures are also important to protect the knee from further injury. Any time any of these exercises cause you to have increased pain or swelling in your knee joint, decrease the amount until you are comfortable again and slowly increase them. If you have problems or questions, call your caregiver or physical therapist for advice.   HOME CARE INSTRUCTIONS   Remove items at home which could result in a fall. This includes throw rugs or furniture in walking pathways.   ICE to the affected knee every three hours for 30 minutes at a time and then as needed for pain and swelling.  Continue to use ice on the knee for pain and swelling from surgery. You may notice swelling that will progress down to the foot and ankle.  This is normal after surgery.  Elevate the leg when you are not up walking on it.    Continue to use the breathing machine which will help keep your temperature down.  It is common for your temperature to cycle up and down following surgery, especially at night when you are not up moving around and exerting yourself.  The breathing machine keeps your lungs expanded and your temperature down.  Do not place pillow under knee, focus on keeping the knee straight while resting  DIET You may resume your previous home diet once your are discharged from the hospital.  DRESSING / WOUND CARE / SHOWERING You may change your dressing 3-5 days after surgery.  Then change the dressing every day with sterile gauze.  Please use good hand washing techniques before changing the dressing.  Do not use any lotions or  creams on the incision until instructed by your surgeon. You may start showering once you are discharged home but do not submerge the incision under water. Just pat the incision dry and apply a dry gauze dressing on daily. Change the surgical dressing daily and reapply a dry dressing each time.  ACTIVITY Walk with your walker as instructed. Use walker as long as suggested by your caregivers. Avoid periods of inactivity such as sitting longer than an hour when not asleep. This helps prevent blood clots.  You may resume a sexual relationship in one month or when given the OK by your doctor.  You may return to work once you are cleared by your doctor.  Do not drive a car for 6 weeks or until released by you surgeon.  Do not drive while taking narcotics.  WEIGHT BEARING Weight bearing as tolerated with assist device (walker, cane, etc) as directed, use it as long as suggested by your surgeon or therapist, typically at least 4-6 weeks.  POSTOPERATIVE CONSTIPATION PROTOCOL Constipation - defined medically as fewer than three stools per week and severe constipation as less than one stool per week.  One of the most common issues patients have following surgery is constipation.  Even if you have a regular bowel pattern at home, your normal regimen is likely to be disrupted due to multiple reasons following surgery.  Combination of anesthesia, postoperative narcotics, change in appetite and fluid intake all can affect your bowels.  In order to avoid complications following surgery, here are some  recommendations in order to help you during your recovery period. ° °Colace (docusate) - Pick up an over-the-counter form of Colace or another stool softener and take twice a day as long as you are requiring postoperative pain medications.  Take with a full glass of water daily.  If you experience loose stools or diarrhea, hold the colace until you stool forms back up.  If your symptoms do not get better within 1  week or if they get worse, check with your doctor. ° °Dulcolax (bisacodyl) - Pick up over-the-counter and take as directed by the product packaging as needed to assist with the movement of your bowels.  Take with a full glass of water.  Use this product as needed if not relieved by Colace only.  ° °MiraLax (polyethylene glycol) - Pick up over-the-counter to have on hand.  MiraLax is a solution that will increase the amount of water in your bowels to assist with bowel movements.  Take as directed and can mix with a glass of water, juice, soda, coffee, or tea.  Take if you go more than two days without a movement. °Do not use MiraLax more than once per day. Call your doctor if you are still constipated or irregular after using this medication for 7 days in a row. ° °If you continue to have problems with postoperative constipation, please contact the office for further assistance and recommendations.  If you experience "the worst abdominal pain ever" or develop nausea or vomiting, please contact the office immediatly for further recommendations for treatment. ° °ITCHING °If you experience itching with your medications, try taking only a single pain pill, or even half a pain pill at a time.  You can also use Benadryl over the counter for itching or also to help with sleep.  ° °TED HOSE STOCKINGS °Wear the elastic stockings on both legs for three weeks following surgery during the day but you may remove then at night for sleeping. ° °MEDICATIONS °See your medication summary on the “After Visit Summary” that the nursing staff will review with you prior to discharge.  You may have some home medications which will be placed on hold until you complete the course of blood thinner medication.  It is important for you to complete the blood thinner medication as prescribed by your surgeon.  Continue your approved medications as instructed at time of discharge. ° °PRECAUTIONS °If you experience chest pain or shortness of breath -  call 911 immediately for transfer to the hospital emergency department.  °If you develop a fever greater that 101 F, purulent drainage from wound, increased redness or drainage from wound, foul odor from the wound/dressing, or calf pain - CONTACT YOUR SURGEON.   °                                                °FOLLOW-UP APPOINTMENTS °Make sure you keep all of your appointments after your operation with your surgeon and caregivers. You should call the office at the above phone number and make an appointment for approximately two weeks after the date of your surgery or on the date instructed by your surgeon outlined in the "After Visit Summary". ° °RANGE OF MOTION AND STRENGTHENING EXERCISES  °Rehabilitation of the knee is important following a knee injury or an operation. After just a few days of immobilization, the muscles of   the thigh which control the knee become weakened and shrink (atrophy). Knee exercises are designed to build up the tone and strength of the thigh muscles and to improve knee motion. Often times heat used for twenty to thirty minutes before working out will loosen up your tissues and help with improving the range of motion but do not use heat for the first two weeks following surgery. These exercises can be done on a training (exercise) mat, on the floor, on a table or on a bed. Use what ever works the best and is most comfortable for you Knee exercises include:  °• Leg Lifts - While your knee is still immobilized in a splint or cast, you can do straight leg raises. Lift the leg to 60 degrees, hold for 3 sec, and slowly lower the leg. Repeat 10-20 times 2-3 times daily. Perform this exercise against resistance later as your knee gets better.  °• Quad and Hamstring Sets - Tighten up the muscle on the front of the thigh (Quad) and hold for 5-10 sec. Repeat this 10-20 times hourly. Hamstring sets are done by pushing the foot backward against an object and holding for 5-10 sec. Repeat as with quad  sets.  °· Leg Slides: Lying on your back, slowly slide your foot toward your buttocks, bending your knee up off the floor (only go as far as is comfortable). Then slowly slide your foot back down until your leg is flat on the floor again. °· Angel Wings: Lying on your back spread your legs to the side as far apart as you can without causing discomfort.  °A rehabilitation program following serious knee injuries can speed recovery and prevent re-injury in the future due to weakened muscles. Contact your doctor or a physical therapist for more information on knee rehabilitation.  ° °IF YOU ARE TRANSFERRED TO A SKILLED REHAB FACILITY °If the patient is transferred to a skilled rehab facility following release from the hospital, a list of the current medications will be sent to the facility for the patient to continue.  When discharged from the skilled rehab facility, please have the facility set up the patient's Home Health Physical Therapy prior to being released. Also, the skilled facility will be responsible for providing the patient with their medications at time of release from the facility to include their pain medication, the muscle relaxants, and their blood thinner medication. If the patient is still at the rehab facility at time of the two week follow up appointment, the skilled rehab facility will also need to assist the patient in arranging follow up appointment in our office and any transportation needs. ° °MAKE SURE YOU:  °• Understand these instructions.  °• Get help right away if you are not doing well or get worse.  ° ° °Pick up stool softner and laxative for home use following surgery while on pain medications. °Do not submerge incision under water. °Please use good hand washing techniques while changing dressing each day. °May shower starting three days after surgery. °Please use a clean towel to pat the incision dry following showers. °Continue to use ice for pain and swelling after surgery. °Do not  use any lotions or creams on the incision until instructed by your surgeon. ° °

## 2018-08-06 NOTE — Anesthesia Preprocedure Evaluation (Signed)
Anesthesia Evaluation  Patient identified by MRN, date of birth, ID band Patient awake    Reviewed: Allergy & Precautions, NPO status , Patient's Chart, lab work & pertinent test results  Airway Mallampati: II  TM Distance: >3 FB Neck ROM: Full    Dental no notable dental hx.    Pulmonary neg pulmonary ROS, former smoker,    Pulmonary exam normal breath sounds clear to auscultation       Cardiovascular hypertension, Normal cardiovascular exam Rhythm:Regular Rate:Normal     Neuro/Psych negative neurological ROS  negative psych ROS   GI/Hepatic negative GI ROS, Neg liver ROS,   Endo/Other  negative endocrine ROS  Renal/GU negative Renal ROS  negative genitourinary   Musculoskeletal negative musculoskeletal ROS (+)   Abdominal   Peds negative pediatric ROS (+)  Hematology negative hematology ROS (+)   Anesthesia Other Findings   Reproductive/Obstetrics negative OB ROS                             Anesthesia Physical Anesthesia Plan  ASA: II  Anesthesia Plan: Spinal   Post-op Pain Management:  Regional for Post-op pain   Induction: Intravenous  PONV Risk Score and Plan:   Airway Management Planned: Simple Face Mask  Additional Equipment:   Intra-op Plan:   Post-operative Plan:   Informed Consent: I have reviewed the patients History and Physical, chart, labs and discussed the procedure including the risks, benefits and alternatives for the proposed anesthesia with the patient or authorized representative who has indicated his/her understanding and acceptance.     Dental advisory given  Plan Discussed with: CRNA and Surgeon  Anesthesia Plan Comments:         Anesthesia Quick Evaluation

## 2018-08-06 NOTE — Interval H&P Note (Signed)
History and Physical Interval Note:  08/06/2018 12:38 PM  Amy Carroll  has presented today for surgery, with the diagnosis of septic arthritis right knee.  The various methods of treatment have been discussed with the patient and family. After consideration of risks, benefits and other options for treatment, the patient has consented to  Procedure(s): TOTAL KNEE REVISION (Right) as a surgical intervention.  The patient's history has been reviewed, patient examined, no change in status, stable for surgery.  I have reviewed the patient's chart and labs.  Questions were answered to the patient's satisfaction.     [name]

## 2018-08-07 ENCOUNTER — Encounter (HOSPITAL_COMMUNITY): Payer: Self-pay | Admitting: Orthopedic Surgery

## 2018-08-07 LAB — CBC
HCT: 30 % — ABNORMAL LOW (ref 36.0–46.0)
Hemoglobin: 9.8 g/dL — ABNORMAL LOW (ref 12.0–15.0)
MCH: 32.8 pg (ref 26.0–34.0)
MCHC: 32.7 g/dL (ref 30.0–36.0)
MCV: 100.3 fL — ABNORMAL HIGH (ref 80.0–100.0)
Platelets: 223 10*3/uL (ref 150–400)
RBC: 2.99 MIL/uL — ABNORMAL LOW (ref 3.87–5.11)
RDW: 12.4 % (ref 11.5–15.5)
WBC: 9.2 10*3/uL (ref 4.0–10.5)
nRBC: 0 % (ref 0.0–0.2)

## 2018-08-07 LAB — BASIC METABOLIC PANEL
Anion gap: 7 (ref 5–15)
BUN: 12 mg/dL (ref 8–23)
CO2: 23 mmol/L (ref 22–32)
Calcium: 8 mg/dL — ABNORMAL LOW (ref 8.9–10.3)
Chloride: 107 mmol/L (ref 98–111)
Creatinine, Ser: 0.75 mg/dL (ref 0.44–1.00)
GFR calc Af Amer: >60 mL/min (ref >60)
GFR calc non Af Amer: >60 mL/min (ref >60)
Glucose, Bld: 155 mg/dL — ABNORMAL HIGH (ref 70–99)
Potassium: 3.5 mmol/L (ref 3.5–5.1)
Sodium: 137 mmol/L (ref 135–145)

## 2018-08-07 MED ORDER — ASPIRIN 325 MG PO TBEC: 325.0000 mg | DELAYED_RELEASE_TABLET | Freq: Two times a day (BID) | ORAL | 0 refills | Status: AC

## 2018-08-07 MED ORDER — TRAMADOL HCL 50 MG PO TABS: 50.0000 mg | ORAL_TABLET | Freq: Four times a day (QID) | ORAL | 0 refills | Status: DC | PRN

## 2018-08-07 MED ORDER — METHOCARBAMOL 500 MG PO TABS: 500.0000 mg | ORAL_TABLET | Freq: Four times a day (QID) | ORAL | 0 refills | Status: DC | PRN

## 2018-08-07 MED ORDER — OXYCODONE HCL 5 MG PO TABS: 5.0000 mg | ORAL_TABLET | Freq: Four times a day (QID) | ORAL | 0 refills | Status: DC | PRN

## 2018-08-07 NOTE — Plan of Care (Signed)
  Problem: Education: ?Goal: Knowledge of General Education information will improve ?Description: Including pain rating scale, medication(s)/side effects and non-pharmacologic comfort measures ?Outcome: Progressing ?  ?Problem: Clinical Measurements: ?Goal: Will remain free from infection ?Outcome: Progressing ?  ?Problem: Clinical Measurements: ?Goal: Diagnostic test results will improve ?Outcome: Progressing ?  ?Problem: Clinical Measurements: ?Goal: Respiratory complications will improve ?Outcome: Progressing ?  ?Problem: Clinical Measurements: ?Goal: Cardiovascular complication will be avoided ?Outcome: Progressing ?  ?

## 2018-08-07 NOTE — Op Note (Signed)
NAME: NORELL, HENNINGSON MEDICAL RECORD WJ:[id number] ACCOUNT [id number] DATE OF BIRTH:11/08/1934 FACILITY: [facility] LOCATION: [id number] PHYSICIAN:Edi Gorniak Dulcy Fanny, MD  OPERATIVE REPORT  DATE OF PROCEDURE:  08/06/2018  PREOPERATIVE DIAGNOSIS:  Questionable septic arthritis, right knee.  POSTOPERATIVE DIAGNOSIS:  Questionable septic arthritis, right knee.  PROCEDURE:  Right total knee arthroplasty revision.  SURGEON:  Ollen Gross, MD  ASSISTANT:  Dennie Bible, PA-C  ANESTHESIA:  Adductor canal block and spinal.  DRAINS:  Hemovac x1.  TOURNIQUET TIME:  Up 26 minutes at 300 mmHg, down 8 minutes, up additional 23 minutes at 300 mmHg.  COMPLICATIONS:  None.  CONDITION:  Stable to recovery.  BRIEF CLINICAL NOTE:  The patient is an 84 year old female who had a right total knee arthroplasty done several years ago elsewhere and has had recurrent pain and effusion in the knee for several years.  She has had aspirations previously, which showed  no infection.  I saw her in second opinion and performed another aspiration, which showed elevated white cell count and elevated neutrophils, but negative culture.  I re-aspirated and sent it for Synovasure, and it did show markers consistent with a  possible periprosthetic infection.  Again, culture was negative.  Clinical symptoms were suggestive of possible infection.  If it is infected, then it was a low virulent organism.  We decided to perform a revision arthroplasty, and depending on  intraoperative findings would more than likely do a 1-stage revision for this.  We discussed this in detail, and she presents today for treatment.  PROCEDURE IN DETAIL:  After successful administration of adductor canal block and spinal anesthetic, a tourniquet was placed high on the right thigh and right lower extremity, prepped and draped in the usual sterile fashion.  Extremity was wrapped in  Esmarch, knee flexed and tourniquet inflated to 300 mmHg.  Midline  incision was made with a 10 blade through subcutaneous tissue to the extensor mechanism.  A fresh blade was used to make a medial parapatellar arthrotomy.  Immediately upon entering the  knee, we did not encounter fluid but encountered an abundant amount of particulate debris which had the appearance of small rice bodies.  There were thousands of these particles that were emanating from the knee.  They were present posteriorly in the  medial and lateral gutters and the suprapatellar pouch.  There was no evidence of any purulent fluid.  We did send a stat Gram stain of synovial fluid which showed no organisms with moderate white cells. A thorough synovectomy was performed.  Soft tissue  over the proximal medial tibia and subperiosteally elevated the joint line with a knife and into the semimembranosus bursa with a Cobb elevator.  Soft tissue laterally was elevated with attention being paid to avoiding the patellar tendon on the tibial  tubercle.  The patella was everted, knee flexed 90 degrees, and the tibial polyethylene removed from the tibial tray.  Circumferential retractors were placed around the tibia and tibia subluxed forward.  I then used an oscillating saw to disrupt the  interface between the tibial component and bone.  The tibial component was removed with essentially no bone loss.  We then thoroughly irrigated the tibial canal and reamed up to 13 mm for placement of a 13 mm stem.  I then placed the extramedullary  tibial alignment guide, referencing proximally at the medial aspect of the tibial tubercle and distally along the second metatarsal axis and tibial crest.  Block was pinned to remove about 2 mm off the previous  resection level.  Tibial resection was made  with an oscillating saw.  Size 2.5 for the MBT revision tray was the most appropriate size, and the proximal tibia was then prepared with the modular drill, then modular drill plus stem extension.  I then broached for a 29 mm sleeve for  rotational  control.  Tibial preparation was then completed.  I placed a cement restrictor, sized it for a size 4 and then placed a size 4 cement restrictor in the tibial canal at the appropriate depth.  We then addressed the femur.  The femoral component was removed by using osteotomes to disrupt the interface between the femoral component and bone.  The component was removed with essentially no bone loss.  We accessed the femoral canal and thoroughly  irrigated.  I reamed up to 18 mm, which had an excellent press fit.  An 18 mm reamer was left in the canal, and that served as our intramedullary alignment guide.  Distal femoral cutting blocks were placed, and I removed 2 mm off the medial and lateral  sides.  We then placed a sizer and size 2.5 was most appropriate for the femur.  The rotation of the AP cutting block was marked off the epicondylar axis and confirmed by placing a spacer block in the flexion space to create a rectangular flexion gap.   Block was pinned in this rotation, and we resected minimal bone anterior and with the chamfers.  Posteriorly had to go up 4 mm to get any bone.  Four mm posterior augments were placed on the medial and lateral sides.  The femoral preparation was  completed with the intercondylar block for the TC3 component for size 2.5.  We then placed the trials.  On the tibial side, it is a size 2.5 MBT revision tray with 10 mm medial and lateral augments to build the joint line back up and a 29 sleeve and 13 x 30 stem extension.  This had excellent fit and it was impacted with the  keel punch.  On the femoral side, a size 2.5 TC3 femur with 4 mm posterior augments medial and lateral, an 18 x 75 stem in a +2 position in 5 degrees of valgus.  A 15 mm trial was placed and full extension was achieved with excellent varus/valgus and  anterior/posterior balance throughout full range of motion.  We then everted the patella.  I had to do a patelloplasty, removing all the soft  tissue to access the patellar component which did not show any wear and which tracked normally in the trochlear  groove.  Thus, we did not need to revise the patellar component.  I then released the tourniquet for 8 minutes while the components were assembled on the back table.  We thoroughly irrigated with IrriSept solution now and at the end of the procedure.  I  stopped any minor bleeding that was identified with electrocautery.  After 8 minutes, we rewrapped the leg in Esmarch and reinflated the tourniquet to 300 mmHg.  The first tourniquet time was 26 minutes.  The components were assembled on the back table  while the tourniquet was down.  After the tourniquet was reinflated, the cement was mixed, which is 3 batches of gentamicin-impregnated cement.  The cut bone surfaces were prepared with pulsatile lavage.  Once the cement was ready for implantation, the  tibial component was cemented in place in the canal and on the cut bone surface.  The femoral component was cemented distally with  a press-fit stem.  A 15 mm insert trial was placed.  The knee held in full extension and all extruded cement removed.  When  the cement fully hardened, we tested again.  With the 15 there was a tiny bit of AP play, so we went to 17.5, which had fantastic stability throughout full range of motion.  The permanent 17.5 mm TC3 rotating platform insert for the size 2.5 was placed,  and the knee was reduced with outstanding stability throughout full range of motion.  The wound was further irrigated with the IrriSept solution.  A Hemovac drain was placed in the joint and the arthrotomy closed over the drain with a running StrataFix  suture.  The tourniquet was released for the second tourniquet time of 23 minutes.  Subcu was then closed with interrupted 2-0 Vicryl and skin with staples.  Note that prior to closing the retinacular layer and the extensor mechanism, I had injected the  extensor mechanism, the posterior capsule,  the subcu tissues, and the periosteum of the femur with a combination of 20 mL of Exparel and 60 mL of saline.  This injection was performed while the cement was hardening.  After closure, then the drains were  hooked to suction.  Incision clean and dry and a bulky sterile dressing applied.  The patient was awakened and transported to recovery in stable condition.  Note that the surgical assistant was a medical necessity for this procedure to do it in a safe and expeditious manner.  Surgical assistance was necessary for retraction of vital ligaments and neurovascular structures and also for proper positioning of  the limb for safe and accurate removal of the old implant and safe and accurate placement of the new implant.  LN/NUANCE  D:08/06/2018 T:08/07/2018 JOB:[id number]

## 2018-08-07 NOTE — Plan of Care (Signed)
  Problem: Acute Rehab PT Goals(only PT should resolve) Goal: Patient Will Transfer Sit To/From Stand Outcome: Progressing Flowsheets (Taken 08/07/2018 1234) Patient will transfer sit to/from stand:  with supervision  from elevated surface Goal: Pt Will Ambulate Outcome: Progressing Flowsheets (Taken 08/07/2018 1234) Pt will Ambulate:  100 feet  with supervision  with rolling walker Goal: Pt/caregiver will Perform Home Exercise Program Outcome: Progressing Flowsheets (Taken 08/07/2018 1234) Pt/caregiver will Perform Home Exercise Program:  For increased ROM  For increased strengthening  With Supervision, verbal cues required/provided

## 2018-08-07 NOTE — Progress Notes (Signed)
   Subjective: 1 Day Post-Op Procedure(s) (LRB): TOTAL KNEE REVISION (Right) Patient reports pain as mild.   Patient seen in rounds with Dr. Wynelle Link. Patient is well, and has had no acute complaints or problems other than difficulty sleeping last night. Denies chest pain, SOB, or calf pain. Foley catheter removed this AM. Therapy cancelled yesterday due to residual effects of spinal anesthesia.  We will begin therapy today.   Objective: Vital signs in last 24 hours: Temp:  [96.8 F (36 C)-98.3 F (36.8 C)] 97.5 F (36.4 C) (06/11 0446) Pulse Rate:  [74-90] 75 (06/11 0446) Resp:  [14-30] 18 (06/11 0446) BP: (91-145)/(56-85) 119/71 (06/11 0446) SpO2:  [94 %-100 %] 100 % (06/11 0446) Weight:  [65 kg] 65 kg (06/10 1234)  Intake/Output from previous day:  Intake/Output Summary (Last 24 hours) at 08/07/2018 0819 Last data filed at 08/07/2018 0600 Gross per 24 hour  Intake 2865.21 ml  Output 1590 ml  Net 1275.21 ml    Labs: Recent Labs    08/07/18 0523  HGB 9.8*   Recent Labs    08/07/18 0523  WBC 9.2  RBC 2.99*  HCT 30.0*  PLT 223   Recent Labs    08/07/18 0523  NA 137  K 3.5  CL 107  CO2 23  BUN 12  CREATININE 0.75  GLUCOSE 155*  CALCIUM 8.0*   Exam: General - Patient is Alert and Oriented Extremity - Neurologically intact Neurovascular intact Sensation intact distally Dorsiflexion/Plantar flexion intact Dressing - dressing C/D/I Motor Function - intact, moving foot and toes well on exam.   Past Medical History:  Diagnosis Date  . Arthritis   . Hypertension     Assessment/Plan: 1 Day Post-Op Procedure(s) (LRB): TOTAL KNEE REVISION (Right) Principal Problem:   Failed total knee arthroplasty (HCC) Active Problems:   Failed total right knee replacement (HCC)  Estimated body mass index is 24.59 kg/m as calculated from the following:   Height as of this encounter: 5\' 4"  (1.626 m).   Weight as of this encounter: 65 kg. Advance diet Up with  therapy  DVT Prophylaxis - Aspirin Weight bearing as tolerated. D/C O2 and pulse ox and try on room air. Hemovac left in place, will continue therapy.  Intraop gram stain showed moderate WBCs, no organisms seen. Will follow culture.   Plan is to go Home after hospital stay. Plan for discharge tomorrow pending progress with therapy. Scheduled for outpatient physical therapy at [facility].  Theresa Duty, PA-C Orthopedic Surgery 08/07/2018, 8:19 AM

## 2018-08-07 NOTE — Evaluation (Signed)
Physical Therapy Evaluation Patient Details Name: Amy Carroll MRN: [id number] DOB: 06-08-1934 Today's Date: 08/07/2018   History of Present Illness  Amy Carroll is a 84 year old female who is POD 1 s/p Rt TKA revision secondary to septic arthritis wtih PMH of Rt TKA in 2014.    Clinical Impression  Amy Carroll is 1 day s/p Rt TKA revision. She is independent at baseline for mobility and typically uses a quad cane at home. She currently requires min assist for bed mobility, transfers, and gait and is limited from 8-60 degrees for AROM of Rt knee. She was able to ambulate ~ 60 feet with min guard and cues for safe management of RW this session and reported increased knee pain. She does not have any stairs to enter home and does not require stair mobility for safe discharge and her husband is available 24/7 to provide assistance as needed. She will benefit from additional therapy session to provide/review HEP handout and progress mobility.     Follow Up Recommendations Home health PT    Equipment Recommendations  Other (comment)(pt requesting toilet riser)    Recommendations for Other Services       Precautions / Restrictions Precautions Precautions: Fall Restrictions Weight Bearing Restrictions: No      Mobility  Bed Mobility Overal bed mobility: Needs Assistance Bed Mobility: Supine to Sit     Supine to sit: Min assist     General bed mobility comments: pt required assistance for sequencing and management of Rt LE  Transfers Overall transfer level: Needs assistance Equipment used: Rolling walker (2 wheeled) Transfers: Sit to/from Stand Sit to Stand: Min assist;From elevated surface         General transfer comment: pt requries cues for hand placement on bed to initiate stand and elevated surface, pt with reduced WB on Rt LE  Ambulation/Gait Ambulation/Gait assistance: Min guard Gait Distance (Feet): 60 Feet Assistive device: Rolling walker (2  wheeled) Gait Pattern/deviations: Step-to pattern;Decreased step length - right;Decreased step length - left;Decreased stance time - right;Decreased stride length;Decreased weight shift to right;Antalgic Gait velocity: decreased          Balance Overall balance assessment: Needs assistance Sitting-balance support: No upper extremity supported;Feet supported Sitting balance-Leahy Scale: Good     Standing balance support: Bilateral upper extremity supported;During functional activity Standing balance-Leahy Scale: Fair Standing balance comment: pt requires UE support to maintain balance in standing, she was able to perform hand hygeine at sink in hospital room while leaning on sink          Pertinent Vitals/Pain Pain Assessment: 0-10 Pain Score: 5  Pain Location: Rt knee Pain Descriptors / Indicators: Aching;Sore Pain Intervention(s): Limited activity within patient's tolerance;Monitored during session    [place] expects to be discharged to:: Private residence Living Arrangements: Spouse/significant other Available Help at Discharge: Family Type of Home: House Home Access: Level entry     Home Layout: One [place]: Environmental consultant - 2 wheels;Cane - quad;Other (comment);Shower seat - built in Additional Comments: ; she has two built in shower seate but is unsure how well they will work for her because they are low    Prior Function Level of Independence: Independent with assistive device(s)            Extremity/Trunk Assessment   Upper Extremity Assessment Upper Extremity Assessment: Overall WFL for tasks assessed    Lower Extremity Assessment Lower Extremity Assessment: RLE deficits/detail RLE Deficits / Details: Rt quadriceps weakness 3/5  limited by pain and edema    Cervical / Trunk Assessment Cervical / Trunk Assessment: Normal  Communication   Communication: No difficulties  Cognition Arousal/Alertness: Awake/alert Behavior During  Therapy: WFL for tasks assessed/performed Overall Cognitive Status: Within Functional Limits for tasks assessed         General Comments General comments (skin integrity, edema, etc.): Rt knee AROM approximately (8-60)    Exercises Total Joint Exercises Quad Sets: Right;5 reps;Supine Heel Slides: AROM;5 reps;Right   Assessment/Plan    PT Assessment Patient needs continued PT services  PT Problem List Decreased strength;Decreased balance;Pain;Decreased range of motion;Decreased mobility;Decreased activity tolerance;Decreased coordination;Decreased skin integrity       PT Treatment Interventions DME instruction;Functional mobility training;Balance training;Patient/family education;Gait training;Therapeutic activities;Stair training;Therapeutic exercise;Neuromuscular re-education;Manual techniques    PT Goals (Current goals can be found in the Care Plan section)  Acute Rehab PT Goals Patient Stated Goal: pt did not state specific goal besides going home PT Goal Formulation: With patient Time For Goal Achievement: 08/10/18 Potential to Achieve Goals: Good    Frequency BID    AM-PAC PT "6 Clicks" Mobility  Outcome Measure Help needed turning from your back to your side while in a flat bed without using bedrails?: A Little Help needed moving from lying on your back to sitting on the side of a flat bed without using bedrails?: A Little Help needed moving to and from a bed to a chair (including a wheelchair)?: A Little Help needed standing up from a chair using your arms (e.g., wheelchair or bedside chair)?: A Little Help needed to walk in hospital room?: A Little Help needed climbing 3-5 steps with a railing? : A Little 6 Click Score: 18    End of Session Equipment Utilized During Treatment: Gait belt Activity Tolerance: Patient tolerated treatment well Patient left: in bed;with call bell/phone within reach;with bed alarm set Nurse Communication: Mobility status PT Visit  Diagnosis: Unsteadiness on feet (R26.81);Other abnormalities of gait and mobility (R26.89);Muscle weakness (generalized) (M62.81);Difficulty in walking, not elsewhere classified (R26.2);Pain Pain - Right/Left: Right Pain - part of body: Knee    Time: 0071-2197 PT Time Calculation (min) (ACUTE ONLY): 43 min   Charges:   PT Evaluation $PT Eval Low Complexity: 1 Low PT Treatments $Gait Training: 8-22 mins $Therapeutic Exercise: 8-22 mins        Kipp Brood, PT, DPT, [facility] Physical Therapist with [facility]  08/07/2018 12:31 PM

## 2018-08-07 NOTE — Progress Notes (Signed)
Physical Therapy Treatment Patient Details Name: Amy Carroll MRN: [id number] DOB: 09-Jul-1934 Today's Date: 08/07/2018    History of Present Illness Amy Carroll is a 84 year old female who is POD 1 s/p Rt TKA revision secondary to septic arthritis wtih PMH of Rt TKA in 2014.    PT Comments    Pt educated on purpose and benefits of completing HEP to assist with knee mobility and strengthening.  Reviewed all exercises with verbalized understanding.  Gait training with min guard, very slow cadence with RW for stability, no LOB.  EOS pt left in bed with ice on knee, bed alarm set, call bell within reach and RN aware of status.  No reoprts of increased pain through session.    Follow Up Recommendations  Home health PT     Equipment Recommendations  Other (comment)(pt requested toilet raiser)    Recommendations for Other Services       Precautions / Restrictions Precautions Precautions: Fall Restrictions Weight Bearing Restrictions: No Other Position/Activity Restrictions: WBAT    Mobility  Bed Mobility   Bed Mobility: Supine to Sit;Sit to Supine     Supine to sit: Min assist     General bed mobility comments: pt required assistance for sequencing and management of Rt LE  Transfers Overall transfer level: Needs assistance Equipment used: Rolling walker (2 wheeled) Transfers: Sit to/from Stand Sit to Stand: Min guard         General transfer comment: pt requries cues for hand placement on bed to initiate stand and elevated surface, pt with reduced WB on Rt LE  Ambulation/Gait Ambulation/Gait assistance: Min guard Gait Distance (Feet): 85 Feet Assistive device: Rolling walker (2 wheeled) Gait Pattern/deviations: Step-to pattern;Decreased step length - right;Decreased step length - left;Decreased stance time - right;Decreased stride length;Decreased weight shift to right;Antalgic Gait velocity: decreased   General Gait Details: slow cadence, no  LOB   Stairs             Wheelchair Mobility    Modified Rankin (Stroke Patients Only)       Balance                                            Cognition Arousal/Alertness: Awake/alert Behavior During Therapy: WFL for tasks assessed/performed Overall Cognitive Status: Within Functional Limits for tasks assessed                                        Exercises Total Joint Exercises Ankle Circles/Pumps: Both;10 reps;Supine Quad Sets: Right;Supine;10 reps Short Arc Quad: Strengthening;Right;10 reps;Supine Heel Slides: AROM;Right;10 reps Hip ABduction/ADduction: Both;10 reps;Supine Straight Leg Raises: Right;10 reps;Supine Long Arc Quad: Right;10 reps;Seated    General Comments        Pertinent Vitals/Pain Pain Assessment: 0-10 Pain Score: 5  Pain Location: Rt knee Pain Descriptors / Indicators: Aching;Sore Pain Intervention(s): Monitored during session;Repositioned;Ice applied    Home Living                      Prior Function            PT Goals (current goals can now be found in the care plan section)      Frequency    BID      PT Plan Current  plan remains appropriate    Co-evaluation              AM-PAC PT "6 Clicks" Mobility   Outcome Measure  Help needed turning from your back to your side while in a flat bed without using bedrails?: A Little Help needed moving from lying on your back to sitting on the side of a flat bed without using bedrails?: A Little Help needed moving to and from a bed to a chair (including a wheelchair)?: A Little Help needed standing up from a chair using your arms (e.g., wheelchair or bedside chair)?: A Little Help needed to walk in hospital room?: A Little Help needed climbing 3-5 steps with a railing? : A Little 6 Click Score: 18    End of Session Equipment Utilized During Treatment: Gait belt Activity Tolerance: Patient tolerated treatment well Patient  left: in bed;with call bell/phone within reach;with bed alarm set(Ice applied to knee, RN aware, called for CPM) Nurse Communication: Mobility status PT Visit Diagnosis: Unsteadiness on feet (R26.81);Other abnormalities of gait and mobility (R26.89);Muscle weakness (generalized) (M62.81);Difficulty in walking, not elsewhere classified (R26.2);Pain Pain - Right/Left: Right Pain - part of body: Knee     Time: 0034-9179 PT Time Calculation (min) (ACUTE ONLY): 58 min  Charges:  $Gait Training: 23-37 mins $Therapeutic Exercise: 8-22 mins $Therapeutic Activity: 8-22 mins                     [address] LPTA; CBIS [phone]  Aldona Lento 08/07/2018, 4:59 PM

## 2018-08-08 LAB — BASIC METABOLIC PANEL
Anion gap: 9 (ref 5–15)
BUN: 14 mg/dL (ref 8–23)
CO2: 25 mmol/L (ref 22–32)
Calcium: 8.3 mg/dL — ABNORMAL LOW (ref 8.9–10.3)
Chloride: 104 mmol/L (ref 98–111)
Creatinine, Ser: 0.7 mg/dL (ref 0.44–1.00)
GFR calc Af Amer: >60 mL/min (ref >60)
GFR calc non Af Amer: >60 mL/min (ref >60)
Glucose, Bld: 111 mg/dL — ABNORMAL HIGH (ref 70–99)
Potassium: 3.2 mmol/L — ABNORMAL LOW (ref 3.5–5.1)
Sodium: 138 mmol/L (ref 135–145)

## 2018-08-08 LAB — CBC
HCT: 29.9 % — ABNORMAL LOW (ref 36.0–46.0)
Hemoglobin: 9.5 g/dL — ABNORMAL LOW (ref 12.0–15.0)
MCH: 32.4 pg (ref 26.0–34.0)
MCHC: 31.8 g/dL (ref 30.0–36.0)
MCV: 102 fL — ABNORMAL HIGH (ref 80.0–100.0)
Platelets: 235 10*3/uL (ref 150–400)
RBC: 2.93 MIL/uL — ABNORMAL LOW (ref 3.87–5.11)
RDW: 12.6 % (ref 11.5–15.5)
WBC: 8.8 10*3/uL (ref 4.0–10.5)
nRBC: 0 % (ref 0.0–0.2)

## 2018-08-08 MED ORDER — POTASSIUM CHLORIDE CRYS ER 20 MEQ PO TBCR
40.0000 meq | EXTENDED_RELEASE_TABLET | Freq: Once | ORAL | Status: AC
  Administered 2018-08-08: 40 meq via ORAL
  Filled 2018-08-08: qty 2

## 2018-08-08 NOTE — Plan of Care (Signed)
  Problem: Education: Goal: Knowledge of General Education information will improve Description: Including pain rating scale, medication(s)/side effects and non-pharmacologic comfort measures Outcome: Adequate for Discharge   Problem: Health Behavior/Discharge Planning: Goal: Ability to manage health-related needs will improve Outcome: Adequate for Discharge   Problem: Clinical Measurements: Goal: Ability to maintain clinical measurements within normal limits will improve Outcome: Adequate for Discharge Goal: Will remain free from infection Outcome: Adequate for Discharge Goal: Diagnostic test results will improve Outcome: Adequate for Discharge Goal: Respiratory complications will improve Outcome: Adequate for Discharge Goal: Cardiovascular complication will be avoided Outcome: Adequate for Discharge   Problem: Activity: Goal: Risk for activity intolerance will decrease Outcome: Adequate for Discharge   Problem: Nutrition: Goal: Adequate nutrition will be maintained Outcome: Adequate for Discharge   Problem: Coping: Goal: Level of anxiety will decrease Outcome: Adequate for Discharge   Problem: Elimination: Goal: Will not experience complications related to bowel motility Outcome: Adequate for Discharge Goal: Will not experience complications related to urinary retention Outcome: Adequate for Discharge   Problem: Pain Managment: Goal: General experience of comfort will improve Outcome: Adequate for Discharge   Problem: Safety: Goal: Ability to remain free from injury will improve Outcome: Adequate for Discharge   Problem: Skin Integrity: Goal: Risk for impaired skin integrity will decrease Outcome: Adequate for Discharge  Home with husband.  Discharge teaching done with patient and her husband over the phone.

## 2018-08-08 NOTE — Discharge Summary (Signed)
Physician Discharge Summary   Patient ID: Amy Carroll MRN: [id number] DOB/AGE: 1934-03-23 84 y.o.  Admit date: 08/06/2018 Discharge date: 08/08/2018  Primary Diagnosis: Questionable septic arthritis, right knee  Admission Diagnoses:  Past Medical History:  Diagnosis Date   Arthritis    Hypertension    Discharge Diagnoses:   Principal Problem:   Failed total knee arthroplasty ([place]) Active Problems:   Failed total right knee replacement ([place])  Estimated body mass index is 24.59 kg/m as calculated from the following:   Height as of this encounter: 5\' 4"  (1.626 m).   Weight as of this encounter: 65 kg.  Procedure:  Procedure(s) (LRB): TOTAL KNEE REVISION (Right)   Consults: None  HPI: The patient is an 84 year old female who had a right total knee arthroplasty done several years ago elsewhere and has had recurrent pain and effusion in the knee for several years.  She has had aspirations previously, which showed no infection.  I saw her in second opinion and performed another aspiration, which showed elevated white cell count and elevated neutrophils, but negative culture.  I re-aspirated and sent it for Synovasure, and it did show markers consistent with a possible periprosthetic infection.  Again, culture was negative.  Clinical symptoms were suggestive of possible infection.  If it is infected, then it was a low virulent organism.  We decided to perform a revision arthroplasty, and depending on intraoperative findings would more than likely do a 1-stage revision for this.  We discussed this in detail, and she presents today for treatment.   Laboratory Data: Admission on 08/06/2018, Discharged on 08/08/2018  Component Date Value Ref Range Status   Specimen Description 08/06/2018    Final                   Value:KNEE RIGHT Performed at [facility], [place] [address] [place], [place] [address]    Special Requests 08/06/2018    Final   Value:FLUID ON SWABS Performed at [facility], [place] [address], [place], [place] [address]    Gram Stain 08/06/2018    Final                   Value:MODERATE WBC PRESENT,BOTH PMN AND MONONUCLEAR NO ORGANISMS SEEN Gram Stain Report Called to,Read Back By and Verified With: T.POZIL AT 1533 ON 08/06/18 BY N.THOMPSON Performed at [facility], [place] [address] [place], [place] [address]    Culture 08/06/2018    Final                   Value:NO GROWTH 2 DAYS NO GROWTH 5 DAYS Performed at [place] [address] [place], [place] [address]    Report Status 08/06/2018 PENDING   Incomplete   WBC 08/07/2018 9.2  4.0 - 10.5 K/uL Final   RBC 08/07/2018 2.99* 3.87 - 5.11 MIL/uL Final   Hemoglobin 08/07/2018 9.8* 12.0 - 15.0 g/dL Final   HCT 08/07/2018 30.0* 36.0 - 46.0 % Final   MCV 08/07/2018 100.3* 80.0 - 100.0 fL Final   MCH 08/07/2018 32.8  26.0 - 34.0 pg Final   MCHC 08/07/2018 32.7  30.0 - 36.0 g/dL Final   RDW 08/07/2018 12.4  11.5 - 15.5 % Final   Platelets 08/07/2018 223  150 - 400 K/uL Final   nRBC 08/07/2018 0.0  0.0 - 0.2 % Final   Performed at [facility], [place] [address], [place], [place] [address]  Sodium 08/07/2018 137  135 - 145 mmol/L Final   Potassium 08/07/2018 3.5  3.5 - 5.1 mmol/L Final   Chloride 08/07/2018 107  98 - 111 mmol/L Final   CO2 08/07/2018 23  22 - 32 mmol/L Final   Glucose, Bld 08/07/2018 155* 70 - 99 mg/dL Final   BUN 08/07/2018 12  8 - 23 mg/dL Final   Creatinine, Ser 08/07/2018 0.75  0.44 - 1.00 mg/dL Final   Calcium 08/07/2018 8.0* 8.9 - 10.3 mg/dL Final   GFR calc non Af Amer 08/07/2018 >60  >60 mL/min Final   GFR calc Af Amer 08/07/2018 >60  >60 mL/min Final   Anion gap 08/07/2018 7  5 - 15 Final   Performed at [facility], [place] [address] [place], [place] [address]   WBC 08/08/2018 8.8  4.0 - 10.5 K/uL Final   RBC 08/08/2018 2.93* 3.87 - 5.11 MIL/uL  Final   Hemoglobin 08/08/2018 9.5* 12.0 - 15.0 g/dL Final   HCT 08/08/2018 29.9* 36.0 - 46.0 % Final   MCV 08/08/2018 102.0* 80.0 - 100.0 fL Final   MCH 08/08/2018 32.4  26.0 - 34.0 pg Final   MCHC 08/08/2018 31.8  30.0 - 36.0 g/dL Final   RDW 08/08/2018 12.6  11.5 - 15.5 % Final   Platelets 08/08/2018 235  150 - 400 K/uL Final   nRBC 08/08/2018 0.0  0.0 - 0.2 % Final   Performed at [facility], [place] [address] [place], [place] [address]   Sodium 08/08/2018 138  135 - 145 mmol/L Final   Potassium 08/08/2018 3.2* 3.5 - 5.1 mmol/L Final   Chloride 08/08/2018 104  98 - 111 mmol/L Final   CO2 08/08/2018 25  22 - 32 mmol/L Final   Glucose, Bld 08/08/2018 111* 70 - 99 mg/dL Final   BUN 08/08/2018 14  8 - 23 mg/dL Final   Creatinine, Ser 08/08/2018 0.70  0.44 - 1.00 mg/dL Final   Calcium 08/08/2018 8.3* 8.9 - 10.3 mg/dL Final   GFR calc non Af Amer 08/08/2018 >60  >60 mL/min Final   GFR calc Af Amer 08/08/2018 >60  >60 mL/min Final   Anion gap 08/08/2018 9  5 - 15 Final   Performed at [facility], [place] [address], [place], [place] [address]  Hospital Outpatient Visit on 08/04/2018  Component Date Value Ref Range Status   SARS Coronavirus 2 08/04/2018 NEGATIVE  NEGATIVE Final   Comment: (NOTE) If result is NEGATIVE SARS-CoV-2 target nucleic acids are NOT DETECTED. The SARS-CoV-2 RNA is generally detectable in upper and lower  respiratory specimens during the acute phase of infection. The lowest  concentration of SARS-CoV-2 viral copies this assay can detect is 250  copies / mL. A negative result does not preclude SARS-CoV-2 infection  and should not be used as the sole basis for treatment or other  patient management decisions.  A negative result may occur with  improper specimen collection / handling, submission of specimen other  than nasopharyngeal swab, presence of viral mutation(s) within the  areas targeted by this assay,  and inadequate number of viral copies  (<250 copies / mL). A negative result must be combined with clinical  observations, patient history, and epidemiological information. If result is POSITIVE SARS-CoV-2 target nucleic acids are DETECTED. The SARS-CoV-2 RNA is generally detectable in upper and lower  respiratory specimens dur  ing the acute phase of infection.  Positive  results are indicative of active infection with SARS-CoV-2.  Clinical  correlation with patient history and other diagnostic information is  necessary to determine patient infection status.  Positive results do  not rule out bacterial infection or co-infection with other viruses. If result is PRESUMPTIVE POSTIVE SARS-CoV-2 nucleic acids MAY BE PRESENT.   A presumptive positive result was obtained on the submitted specimen  and confirmed on repeat testing.  While 2019 novel coronavirus  (SARS-CoV-2) nucleic acids may be present in the submitted sample  additional confirmatory testing may be necessary for epidemiological  and / or clinical management purposes  to differentiate between  SARS-CoV-2 and other Sarbecovirus currently known to infect humans.  If clinically indicated additional testing with an alternate test  methodology [phone]) is advised. The SARS-CoV-2 RNA is generally  detectable in upper and lower respiratory sp                          ecimens during the acute  phase of infection. The expected result is Negative. Fact Sheet for Patients:  StrictlyIdeas.no Fact Sheet for Healthcare Providers: BankingDealers.co.za This test is not yet approved or cleared by the [place] FDA and has been authorized for detection and/or diagnosis of SARS-CoV-2 by FDA under an Emergency Use Authorization (EUA).  This EUA will remain in effect (meaning this test can be used) for the duration of the COVID-19 declaration under Section 564(b)(1) of  the Act, 21 U.S.C. section 360bbb-3(b)(1), unless the authorization is terminated or revoked sooner. Performed at [facility], [place] [address] [place], [place] [address]   Hospital Outpatient Visit on 08/01/2018  Component Date Value Ref Range Status   aPTT 08/01/2018 36  24 - 36 seconds Final   Performed at [facility], [place] [address] [place], [place] [address]   WBC 08/01/2018 5.1  4.0 - 10.5 K/uL Final   RBC 08/01/2018 3.69* 3.87 - 5.11 MIL/uL Final   Hemoglobin 08/01/2018 11.8* 12.0 - 15.0 g/dL Final   HCT 08/01/2018 37.1  36.0 - 46.0 % Final   MCV 08/01/2018 100.5* 80.0 - 100.0 fL Final   MCH 08/01/2018 32.0  26.0 - 34.0 pg Final   MCHC 08/01/2018 31.8  30.0 - 36.0 g/dL Final   RDW 08/01/2018 12.4  11.5 - 15.5 % Final   Platelets 08/01/2018 303  150 - 400 K/uL Final   nRBC 08/01/2018 0.0  0.0 - 0.2 % Final   Performed at [facility], [place] [address] [place], [place] [address]   Sodium 08/01/2018 136  135 - 145 mmol/L Final   Potassium 08/01/2018 3.9  3.5 - 5.1 mmol/L Final   Chloride 08/01/2018 102  98 - 111 mmol/L Final   CO2 08/01/2018 26  22 - 32 mmol/L Final   Glucose, Bld 08/01/2018 100* 70 - 99 mg/dL Final   BUN 08/01/2018 43* 8 - 23 mg/dL Final   Creatinine, Ser 08/01/2018 0.95  0.44 - 1.00 mg/dL Final   Calcium 08/01/2018 8.7* 8.9 - 10.3 mg/dL Final   Total Protein 08/01/2018 7.7  6.5 - 8.1 g/dL Final   Albumin 08/01/2018 3.8  3.5 - 5.0 g/dL Final   AST 08/01/2018 14* 15 - 41 U/L Final   ALT 08/01/2018 17  0 - 44 U/L Final   Alkaline Phosphatase 08/01/2018 74  38 - 126 U/L Final   Total Bilirubin 08/01/2018 0.7  0.3 -  1.2 mg/dL Final   GFR calc non Af Amer 08/01/2018 56* >60 mL/min Final   GFR calc Af Amer 08/01/2018 >60  >60 mL/min Final   Anion gap 08/01/2018 8  5 - 15 Final   Performed at [facility], [place] [address] [place], [place] [address]    Prothrombin Time 08/01/2018 12.1  11.4 - 15.2 seconds Final   INR 08/01/2018 0.9  0.8 - 1.2 Final   Comment: (NOTE) INR goal varies based on device and disease states. Performed at [facility], [place] [address], [place], [place] [address]    ABO/RH(D) 08/01/2018 A POS   Final   Antibody Screen 08/01/2018 NEG   Final   Sample Expiration 08/01/2018 08/09/2018,2359   Final   Extend sample reason 08/01/2018    Final                   Value:NO TRANSFUSIONS OR PREGNANCY IN THE PAST 3 MONTHS Performed at [facility], [place] [address], [place], [place] [address]    MRSA, PCR 08/01/2018 NEGATIVE  NEGATIVE Final   Staphylococcus aureus 08/01/2018 NEGATIVE  NEGATIVE Final   Comment: (NOTE) The Xpert SA Assay (FDA approved for NASAL specimens in patients 70 years of age and older), is one component of a comprehensive surveillance program. It is not intended to diagnose infection nor to guide or monitor treatment. Performed at [facility], [place] [address] [place], [place] [address]    ABO/RH(D) 08/01/2018    Final                   Value:A POS Performed at [facility], [place] [address], [place], [place] [address]      X-Rays:No results found.  EKG: Orders placed or performed during the hospital encounter of 08/01/18   EKG 12 lead   EKG 12 lead     Hospital Course: DELLE ANDRZEJEWSKI is a 84 y.o. who was admitted to [facility]. They were brought to the operating room on 08/06/2018 and underwent Procedure(s): [place].  Patient tolerated the procedure well and was later transferred to the recovery room and then to the orthopaedic floor for postoperative care. They were given PO and IV analgesics for pain control following their surgery. They were given 24 hours of postoperative antibiotics of  Anti-infectives (From admission, onward)   Start     Dose/Rate Route Frequency Ordered Stop    08/07/18 0600  ceFAZolin (ANCEF) IVPB 2g/100 mL premix     2 g 200 mL/hr over 30 Minutes Intravenous On call to O.R. 08/06/18 1232 08/06/18 1442   08/06/18 2030  ceFAZolin (ANCEF) IVPB 2g/100 mL premix     2 g 200 mL/hr over 30 Minutes Intravenous Every 6 hours 08/06/18 1739 08/07/18 0245     and started on DVT prophylaxis in the form of Aspirin.   PT and OT were ordered for total joint protocol. Discharge planning consulted to help with postop disposition and equipment needs. Patient had a good night on the evening of surgery. They started to get up OOB with therapy on POD #1. Hemovac drain was pulled without difficulty on day one. Continued to work with therapy into POD #2. Pt was seen during rounds on day two and was ready to go home pending progress with therapy. Potassium of 3.2, which was treated with one dose of potassium chloride 40 mEq. Dressing was changed and the incision was clean and  intact. She was discharged to home later that day in stable condition.  Diet: Regular diet Activity: WBAT Follow-up: in 2 weeks Disposition: Home Discharged Condition: good   Discharge Instructions    Call MD / Call 911   Complete by: As directed    If you experience chest pain or shortness of breath, CALL 911 and be transported to the hospital emergency room.  If you develope a fever above 101 F, pus (white drainage) or increased drainage or redness at the wound, or calf pain, call your surgeon's office.   Change dressing   Complete by: As directed    Change the dressing daily with sterile 4 x 4 inch gauze dressing and apply TED hose.   Constipation Prevention   Complete by: As directed    Drink plenty of fluids.  Prune juice may be helpful.  You may use a stool softener, such as Colace (over the counter) 100 mg twice a day.  Use MiraLax (over the counter) for constipation as needed.   Diet - low sodium heart healthy   Complete by: As directed    Discharge instructions   Complete by: As  directed    Dr. Gaynelle Arabian Total Joint Specialist [facility] [address] [place], [place] [address] [phone]  TOTAL KNEE REVISION POSTOPERATIVE DIRECTIONS  Knee Rehabilitation, Guidelines Following Surgery  Results after knee surgery are often greatly improved when you follow the exercise, range of motion and muscle strengthening exercises prescribed by your doctor. Safety measures are also important to protect the knee from further injury. Any time any of these exercises cause you to have increased pain or swelling in your knee joint, decrease the amount until you are comfortable again and slowly increase them. If you have problems or questions, call your caregiver or physical therapist for advice.   HOME CARE INSTRUCTIONS  Remove items at home which could result in a fall. This includes throw rugs or furniture in walking pathways.  ICE to the affected knee every three hours for 30 minutes at a time and then as needed for pain and swelling.  Continue to use ice on the knee for pain and swelling from surgery. You may notice swelling that will progress down to the foot and ankle.  This is normal after surgery.  Elevate the leg when you are not up walking on it.   Continue to use the breathing machine which will help keep your temperature down.  It is common for your temperature to cycle up and down following surgery, especially at night when you are not up moving around and exerting yourself.  The breathing machine keeps your lungs expanded and your temperature down. Do not place pillow under knee, focus on keeping the knee straight while resting   DIET You may resume your previous home diet once your are discharged from the hospital.  DRESSING / WOUND CARE / SHOWERING You may change your dressing 3-5 days after surgery.  Then change the dressing every day with sterile gauze.  Please use good hand washing techniques before changing the dressing.  Do not use any lotions  or creams on the incision until instructed by your surgeon. You may start showering once you are discharged home but do not submerge the incision under water. Just pat the incision dry and apply a dry gauze dressing on daily. Change the surgical dressing daily and reapply a dry dressing each time.  ACTIVITY Walk with your walker as instructed. Use walker as long  as suggested by your caregivers. Avoid periods of inactivity such as sitting longer than an hour when not asleep. This helps prevent blood clots.  You may resume a sexual relationship in one month or when given the OK by your doctor.  You may return to work once you are cleared by your doctor.  Do not drive a car for 6 weeks or until released by you surgeon.  Do not drive while taking narcotics.  WEIGHT BEARING Weight bearing as tolerated with assist device (walker, cane, etc) as directed, use it as long as suggested by your surgeon or therapist, typically at least 4-6 weeks.  POSTOPERATIVE CONSTIPATION PROTOCOL Constipation - defined medically as fewer than three stools per week and severe constipation as less than one stool per week.  One of the most common issues patients have following surgery is constipation.  Even if you have a regular bowel pattern at home, your normal regimen is likely to be disrupted due to multiple reasons following surgery.  Combination of anesthesia, postoperative narcotics, change in appetite and fluid intake all can affect your bowels.  In order to avoid complications following surgery, here are some recommendations in order to help you during your recovery period.  Colace (docusate) - Pick up an over-the-counter form of Colace or another stool softener and take twice a day as long as you are requiring postoperative pain medications.  Take with a full glass of water daily.  If you experience loose stools or diarrhea, hold the colace until you stool forms back up.  If your symptoms do not get better within 1  week or if they get worse, check with your doctor.  Dulcolax (bisacodyl) - Pick up over-the-counter and take as directed by the product packaging as needed to assist with the movement of your bowels.  Take with a full glass of water.  Use this product as needed if not relieved by Colace only.   MiraLax (polyethylene glycol) - Pick up over-the-counter to have on hand.  MiraLax is a solution that will increase the amount of water in your bowels to assist with bowel movements.  Take as directed and can mix with a glass of water, juice, soda, coffee, or tea.  Take if you go more than two days without a movement. Do not use MiraLax more than once per day. Call your doctor if you are still constipated or irregular after using this medication for 7 days in a row.  If you continue to have problems with postoperative constipation, please contact the office for further assistance and recommendations.  If you experience "the worst abdominal pain ever" or develop nausea or vomiting, please contact the office immediatly for further recommendations for treatment.  ITCHING  If you experience itching with your medications, try taking only a single pain pill, or even half a pain pill at a time.  You can also use Benadryl over the counter for itching or also to help with sleep.   TED HOSE STOCKINGS Wear the elastic stockings on both legs for three weeks following surgery during the day but you may remove then at night for sleeping.  MEDICATIONS See your medication summary on the "After Visit Summary" that the nursing staff will review with you prior to discharge.  You may have some home medications which will be placed on hold until you complete the course of blood thinner medication.  It is important for you to complete the blood thinner medication as prescribed by your surgeon.  Continue your  approved medications as instructed at time of discharge.  PRECAUTIONS If you experience chest pain or shortness of breath  - call 911 immediately for transfer to the hospital emergency department.  If you develop a fever greater that 101 F, purulent drainage from wound, increased redness or drainage from wound, foul odor from the wound/dressing, or calf pain - CONTACT YOUR SURGEON.                                                   FOLLOW-UP APPOINTMENTS Make sure you keep all of your appointments after your operation with your surgeon and caregivers. You should call the office at the above phone number and make an appointment for approximately two weeks after the date of your surgery or on the date instructed by your surgeon outlined in the "After Visit Summary".   RANGE OF MOTION AND STRENGTHENING EXERCISES  Rehabilitation of the knee is important following a knee injury or an operation. After just a few days of immobilization, the muscles of the thigh which control the knee become weakened and shrink (atrophy). Knee exercises are designed to build up the tone and strength of the thigh muscles and to improve knee motion. Often times heat used for twenty to thirty minutes before working out will loosen up your tissues and help with improving the range of motion but do not use heat for the first two weeks following surgery. These exercises can be done on a training (exercise) mat, on the floor, on a table or on a bed. Use what ever works the best and is most comfortable for you Knee exercises include:  Leg Lifts - While your knee is still immobilized in a splint or cast, you can do straight leg raises. Lift the leg to 60 degrees, hold for 3 sec, and slowly lower the leg. Repeat 10-20 times 2-3 times daily. Perform this exercise against resistance later as your knee gets better.  Quad and Hamstring Sets - Tighten up the muscle on the front of the thigh (Quad) and hold for 5-10 sec. Repeat this 10-20 times hourly. Hamstring sets are done by pushing the foot backward against an object and holding for 5-10 sec. Repeat as with quad  sets.  Leg Slides: Lying on your back, slowly slide your foot toward your buttocks, bending your knee up off the floor (only go as far as is comfortable). Then slowly slide your foot back down until your leg is flat on the floor again. Angel Wings: Lying on your back spread your legs to the side as far apart as you can without causing discomfort.  A rehabilitation program following serious knee injuries can speed recovery and prevent re-injury in the future due to weakened muscles. Contact your doctor or a physical therapist for more information on knee rehabilitation.   IF YOU ARE TRANSFERRED TO A SKILLED REHAB FACILITY If the patient is transferred to a skilled rehab facility following release from the hospital, a list of the current medications will be sent to the facility for the patient to continue.  When discharged from the skilled rehab facility, please have the facility set up the patient's [place] prior to being released. Also, the skilled facility will be responsible for providing the patient with their medications at time of release from the facility to include their pain medication, the  muscle relaxants, and their blood thinner medication. If the patient is still at the rehab facility at time of the two week follow up appointment, the skilled rehab facility will also need to assist the patient in arranging follow up appointment in our office and any transportation needs.  MAKE SURE YOU:  Understand these instructions.  Get help right away if you are not doing well or get worse.    Pick up stool softner and laxative for home use following surgery while on pain medications. Do not submerge incision under water. Please use good hand washing techniques while changing dressing each day. May shower starting three days after surgery. Please use a clean towel to pat the incision dry following showers. Continue to use ice for pain and swelling after surgery. Do not use any  lotions or creams on the incision until instructed by your surgeon.   Do not put a pillow under the knee. Place it under the heel.   Complete by: As directed    Driving restrictions   Complete by: As directed    No driving for two weeks   TED hose   Complete by: As directed    Use stockings (TED hose) for three weeks on both leg(s).  You may remove them at night for sleeping.   Weight bearing as tolerated   Complete by: As directed      Allergies as of 08/08/2018   No Known Allergies     Medication List    STOP taking these medications   celecoxib 200 MG capsule Commonly known as: CELEBREX     TAKE these medications   acetaminophen 325 MG tablet Commonly known as: TYLENOL Take 650 mg by mouth every 6 (six) hours as needed for moderate pain.   amLODipine 5 MG tablet Commonly known as: NORVASC Take 5 mg by mouth daily.   aspirin 325 MG EC tablet Take 1 tablet (325 mg total) by mouth 2 (two) times daily for 19 days. Then take one 81 mg aspirin once a day for three weeks. Then discontinue aspirin.   cholecalciferol 25 MCG (1000 UT) tablet Commonly known as: VITAMIN D3 Take 1,000 Units by mouth daily.   Combigan 0.2-0.5 % ophthalmic solution Generic drug: brimonidine-timolol Place 1 drop into the right eye every 12 (twelve) hours.   diazepam 5 MG tablet Commonly known as: VALIUM 1 po qhs prn   dorzolamide 2 % ophthalmic solution Commonly known as: TRUSOPT Place 1 drop into the right eye 2 (two) times daily.   latanoprost 0.005 % ophthalmic solution Commonly known as: XALATAN Place 1 drop into both eyes at bedtime.   methocarbamol 500 MG tablet Commonly known as: ROBAXIN Take 1 tablet (500 mg total) by mouth every 6 (six) hours as needed for muscle spasms.   olmesartan-hydrochlorothiazide 40-25 MG tablet Commonly known as: BENICAR HCT Take 1 tablet by mouth daily.   oxyCODONE 5 MG immediate release tablet Commonly known as: Oxy IR/ROXICODONE Take 1-2 tablets  (5-10 mg total) by mouth every 6 (six) hours as needed for severe pain.   PRESERVISION AREDS 2 PO Take 2 capsules by mouth daily.   traMADol 50 MG tablet Commonly known as: ULTRAM Take 1-2 tablets (50-100 mg total) by mouth every 6 (six) hours as needed for moderate pain.            Discharge Care Instructions  (From admission, onward)         Start     Ordered   08/07/18 0000  Weight bearing as tolerated     08/07/18 0825   08/07/18 0000  Change dressing    Comments: Change the dressing daily with sterile 4 x 4 inch gauze dressing and apply TED hose.   08/07/18 0825         Follow-up Information    Gaynelle Arabian, MD. Schedule an appointment as soon as possible for a visit on 08/19/2018.   Specialty: Orthopedic Surgery Contact information: [address] [place] [address] [phone]           Signed: Griffith Citron, PA-C Orthopedic Surgery 08/08/2018, 3:11 PM

## 2018-08-08 NOTE — Progress Notes (Addendum)
   Subjective: 2 Days Post-Op Procedure(s) (LRB): TOTAL KNEE REVISION (Right) Patient reports pain as mild.   Patient seen in rounds with Dr. Wynelle Link. Patient is well, and has had no acute complaints or problems other than pain in the right knee. No acute events overnight. Voiding without difficulty, positive flatus. Patient states she is ready to go home today. Denies CP, SHOB.  Plan is to go Home after hospital stay.  Objective: Vital signs in last 24 hours: Temp:  [97.5 F (36.4 C)-97.9 F (36.6 C)] 97.8 F (36.6 C) (06/12 0459) Pulse Rate:  [76-88] 88 (06/12 0459) Resp:  [16-18] 18 (06/12 0459) BP: (112-138)/(57-85) 128/74 (06/12 0459) SpO2:  [97 %-100 %] 99 % (06/12 0459)  Intake/Output from previous day:  Intake/Output Summary (Last 24 hours) at 08/08/2018 0714 Last data filed at 08/08/2018 0459 Gross per 24 hour  Intake 480 ml  Output 1970 ml  Net -1490 ml    Intake/Output this shift: No intake/output data recorded.  Labs: Recent Labs    08/07/18 0523 08/08/18 0456  HGB 9.8* 9.5*   Recent Labs    08/07/18 0523 08/08/18 0456  WBC 9.2 8.8  RBC 2.99* 2.93*  HCT 30.0* 29.9*  PLT 223 235   Recent Labs    08/07/18 0523 08/08/18 0456  NA 137 138  K 3.5 3.2*  CL 107 104  CO2 23 25  BUN 12 14  CREATININE 0.75 0.70  GLUCOSE 155* 111*  CALCIUM 8.0* 8.3*   No results for input(s): LABPT, INR in the last 72 hours.  Exam: General - Patient is Alert and Appropriate Extremity - Neurologically intact Sensation intact distally Intact pulses distally Dorsiflexion/Plantar flexion intact Dressing/Incision - clean, dry Motor Function - intact, moving foot and toes well on exam.   Past Medical History:  Diagnosis Date  . Arthritis   . Hypertension     Assessment/Plan: 2 Days Post-Op Procedure(s) (LRB): TOTAL KNEE REVISION (Right) Principal Problem:   Failed total knee arthroplasty (HCC) Active Problems:   Failed total right knee replacement (HCC)   Estimated body mass index is 24.59 kg/m as calculated from the following:   Height as of this encounter: 5\' 4"  (1.626 m).   Weight as of this encounter: 65 kg. Advance diet Up with therapy D/C IV fluids  DVT Prophylaxis - Aspirin Weight-bearing as tolerated Hemovac pulled without difficulty.  Potassium of 3.2 today, we will give one dose of potassium chloride 40 mEq. Plan for discharge home today after 1-2 sessions of therapy, as long as she continues to meet goals. She is scheduled for outpatient PT at [facility]. Follow up in the office in 2 weeks.  Griffith Citron, PA-C Orthopedic Surgery 08/08/2018, 7:14 AM

## 2018-08-08 NOTE — Progress Notes (Addendum)
Physical Therapy Treatment Patient Details Name: Amy Carroll MRN: [id number] DOB: 1935/01/19 Today's Date: 08/08/2018    History of Present Illness Amy Carroll is a 84 year old female who is POD 1 s/p Rt TKA revision secondary to septic arthritis wtih PMH of Rt TKA in 2014.    PT Comments    POD # 2 am session Assisted OOB to amb to bathroom.  General bed mobility comments: pt required assistance for sequencing and management of Rt LE.  General transfer comment: assisted off bed and also performed toilet transfer.  Required 50% VC's safety with turns and use of walker. General Gait Details: 50% VC's on proper walker to self distance and safety with backward gait to toilet.   Follow Up Recommendations  Home health PT     Equipment Recommendations       Recommendations for Other Services       Precautions / Restrictions Precautions Precautions: Fall Restrictions Weight Bearing Restrictions: No Other Position/Activity Restrictions: WBAT    Mobility  Bed Mobility Overal bed mobility: Needs Assistance Bed Mobility: Supine to Sit     Supine to sit: Min guard;Min assist     General bed mobility comments: pt required assistance for sequencing and management of Rt LE  Transfers Overall transfer level: Needs assistance Equipment used: Rolling walker (2 wheeled) Transfers: Sit to/from Stand Sit to Stand: Min guard         General transfer comment: assisted off bed and also performed toilet transfer.  Required 50% VC's safety with turns and use of walker.  Ambulation/Gait Ambulation/Gait assistance: Supervision;Min guard Gait Distance (Feet): 10 Feet to bathroom Assistive device: Rolling walker (2 wheeled) Gait Pattern/deviations: Step-to pattern;Decreased step length - right;Decreased step length - left;Decreased stance time - right;Decreased stride length;Decreased weight shift to right;Antalgic Gait velocity: decreased   General Gait Details: 50% VC's on  proper walker to self distance and safety with backward gait to recliner.   Stairs             Wheelchair Mobility    Modified Rankin (Stroke Patients Only)       Balance                                            Cognition Arousal/Alertness: Awake/alert                                     General Comments: required repeat functional VC's and was easily distracted.  Impaired safety cognition.      Exercises      General Comments        Pertinent Vitals/Pain Pain Assessment: Faces Faces Pain Scale: Hurts little more Pain Location: Rt knee Pain Descriptors / Indicators: Aching;Sore Pain Intervention(s): Monitored during session    Home Living                      Prior Function            PT Goals (current goals can now be found in the care plan section) Progress towards PT goals: Progressing toward goals    Frequency           PT Plan Current plan remains appropriate    Co-evaluation  AM-PAC PT "6 Clicks" Mobility   Outcome Measure  Help needed turning from your back to your side while in a flat bed without using bedrails?: A Little Help needed moving from lying on your back to sitting on the side of a flat bed without using bedrails?: A Little Help needed moving to and from a bed to a chair (including a wheelchair)?: A Little Help needed standing up from a chair using your arms (e.g., wheelchair or bedside chair)?: A Little Help needed to walk in hospital room?: A Little Help needed climbing 3-5 steps with a railing? : A Little 6 Click Score: 18    End of Session Equipment Utilized During Treatment: Gait belt Activity Tolerance: Patient tolerated treatment well Patient left: Other (comment)(in bahtroom) Nurse Communication: Mobility status PT Visit Diagnosis: Unsteadiness on feet (R26.81);Other abnormalities of gait and mobility (R26.89);Muscle weakness (generalized)  (M62.81);Difficulty in walking, not elsewhere classified (R26.2);Pain Pain - Right/Left: Right Pain - part of body: Knee     Time: 2122-4825 PT Time Calculation (min) (ACUTE ONLY): 15 min  Charges:  $Gait Training: 8-22 mins                     Rica Koyanagi  PTA Acute  Rehabilitation Services Pager      [phone] Office      [phone]

## 2018-08-08 NOTE — Progress Notes (Signed)
Physical Therapy Treatment Patient Details Name: Amy Carroll MRN: [id number] DOB: January 01, 1935 Today's Date: 08/08/2018    History of Present Illness Amy Carroll is a 84 year old female who is POD 1 s/p Rt TKA revision secondary to septic arthritis wtih PMH of Rt TKA in 2014.    PT Comments    POD # 2 pm session Assisted out of bathroom to amb in hallway and practice one step.General transfer comment: assisted off toilet.    Required 50% VC's safety with turns and use of walker. Pt attempted to "park" walker to side and amb to sink without AD.  General Gait Details: 50% VC's on proper walker to self distance and safety with backward gait to recliner.General stair comments: practiced twice one step forward with walker required 50% VC's on proper walker placement and proper sequencing. Then returned to room to perform some TE's following HEP handout.  Instructed on proper tech, freq as well as use of ICE.   Addressed all mobility questions, discussed appropriate activity, educated on use of ICE.  Pt ready for D/C to home.   Follow Up Recommendations  Home health PT     Equipment Recommendations       Recommendations for Other Services       Precautions / Restrictions Precautions Precautions: Fall Restrictions Weight Bearing Restrictions: No Other Position/Activity Restrictions: WBAT    Mobility  Bed Mobility Overal bed mobility: Needs Assistance Bed Mobility: Supine to Sit     Supine to sit: Min guard;Min assist     General bed mobility comments: OOB in bathroom  Transfers Overall transfer level: Needs assistance Equipment used: Rolling walker (2 wheeled) Transfers: Sit to/from Stand Sit to Stand: Min guard         General transfer comment: assisted off toilet.    Required 50% VC's safety with turns and use of walker. Pt attempted to "park" walker to side and amb to sink without AD.  Ambulation/Gait Ambulation/Gait assistance: Supervision;Min  guard Gait Distance (Feet): 55 Feet Assistive device: Rolling walker (2 wheeled) Gait Pattern/deviations: Step-to pattern;Decreased step length - right;Decreased step length - left;Decreased stance time - right;Decreased stride length;Decreased weight shift to right;Antalgic Gait velocity: decreased   General Gait Details: 50% VC's on proper walker to self distance and safety with backward gait to recliner.   Stairs Stairs: Yes Stairs assistance: Min guard;Min assist Stair Management: No rails;Step to pattern;With walker;Forwards Number of Stairs: 1 General stair comments: practiced twice one step forward with walker required 50% VC's on proper walker placement and proper sequencing.   Wheelchair Mobility    Modified Rankin (Stroke Patients Only)       Balance                                            Cognition Arousal/Alertness: Awake/alert                                     General Comments: required repeat functional VC's and was easily distracted.  Impaired safety cognition.      Exercises   Total Knee Replacement TE's 10 reps B LE ankle pumps 10 reps heel slides  10 reps SLR's 10 reps ABD Followed by ICE     General Comments        Pertinent  Vitals/Pain Pain Assessment: Faces Faces Pain Scale: Hurts little more Pain Location: Rt knee Pain Descriptors / Indicators: Aching;Sore Pain Intervention(s): Monitored during session    Home Living                      Prior Function            PT Goals (current goals can now be found in the care plan section) Progress towards PT goals: Progressing toward goals    Frequency           PT Plan Current plan remains appropriate    Co-evaluation              AM-PAC PT "6 Clicks" Mobility   Outcome Measure  Help needed turning from your back to your side while in a flat bed without using bedrails?: A Little Help needed moving from lying on your back  to sitting on the side of a flat bed without using bedrails?: A Little Help needed moving to and from a bed to a chair (including a wheelchair)?: A Little Help needed standing up from a chair using your arms (e.g., wheelchair or bedside chair)?: A Little Help needed to walk in hospital room?: A Little Help needed climbing 3-5 steps with a railing? : A Little 6 Click Score: 18    End of Session Equipment Utilized During Treatment: Gait belt Activity Tolerance: Patient tolerated treatment well Patient left: Other (comment)(in bahtroom) Nurse Communication: Mobility status PT Visit Diagnosis: Unsteadiness on feet (R26.81);Other abnormalities of gait and mobility (R26.89);Muscle weakness (generalized) (M62.81);Difficulty in walking, not elsewhere classified (R26.2);Pain Pain - Right/Left: Right Pain - part of body: Knee     Time: 0277-4128 PT Time Calculation (min) (ACUTE ONLY): 26 min  Charges:  $Gait Training: 8-22 mins $Therapeutic Exercise: 8-22 mins                     Rica Koyanagi  PTA Acute  Rehabilitation Services Pager      [phone] Office      [phone]

## 2018-08-08 NOTE — Care Management Important Message (Signed)
Important Message  Patient Details IM Letter given to [name] to present to the Patient Name: Amy Carroll MRN: [id number] Date of Birth: May 14, 1934   Medicare Important Message Given:  Yes    Kerin Salen 08/08/2018, 11:13 AM

## 2018-08-09 DIAGNOSIS — Z20828 Contact with and (suspected) exposure to other viral communicable diseases: Secondary | ICD-10-CM | POA: Diagnosis not present

## 2018-08-12 DIAGNOSIS — M62551 Muscle wasting and atrophy, not elsewhere classified, right thigh: Secondary | ICD-10-CM | POA: Diagnosis not present

## 2018-08-12 DIAGNOSIS — Z4733 Aftercare following explantation of knee joint prosthesis: Secondary | ICD-10-CM | POA: Diagnosis not present

## 2018-08-12 DIAGNOSIS — Z96659 Presence of unspecified artificial knee joint: Secondary | ICD-10-CM | POA: Diagnosis not present

## 2018-08-12 DIAGNOSIS — Z471 Aftercare following joint replacement surgery: Secondary | ICD-10-CM | POA: Diagnosis not present

## 2018-08-12 DIAGNOSIS — R2689 Other abnormalities of gait and mobility: Secondary | ICD-10-CM | POA: Diagnosis not present

## 2018-08-12 DIAGNOSIS — R278 Other lack of coordination: Secondary | ICD-10-CM | POA: Diagnosis not present

## 2018-08-12 LAB — AEROBIC/ANAEROBIC CULTURE W GRAM STAIN (SURGICAL/DEEP WOUND): Culture: NO GROWTH

## 2018-08-13 DIAGNOSIS — Z4733 Aftercare following explantation of knee joint prosthesis: Secondary | ICD-10-CM | POA: Diagnosis not present

## 2018-08-13 DIAGNOSIS — Z96659 Presence of unspecified artificial knee joint: Secondary | ICD-10-CM | POA: Diagnosis not present

## 2018-08-13 DIAGNOSIS — M62551 Muscle wasting and atrophy, not elsewhere classified, right thigh: Secondary | ICD-10-CM | POA: Diagnosis not present

## 2018-08-13 DIAGNOSIS — R278 Other lack of coordination: Secondary | ICD-10-CM | POA: Diagnosis not present

## 2018-08-13 DIAGNOSIS — R2689 Other abnormalities of gait and mobility: Secondary | ICD-10-CM | POA: Diagnosis not present

## 2018-08-13 DIAGNOSIS — Z471 Aftercare following joint replacement surgery: Secondary | ICD-10-CM | POA: Diagnosis not present

## 2018-08-18 DIAGNOSIS — R2689 Other abnormalities of gait and mobility: Secondary | ICD-10-CM | POA: Diagnosis not present

## 2018-08-18 DIAGNOSIS — Z471 Aftercare following joint replacement surgery: Secondary | ICD-10-CM | POA: Diagnosis not present

## 2018-08-18 DIAGNOSIS — Z96659 Presence of unspecified artificial knee joint: Secondary | ICD-10-CM | POA: Diagnosis not present

## 2018-08-18 DIAGNOSIS — Z4733 Aftercare following explantation of knee joint prosthesis: Secondary | ICD-10-CM | POA: Diagnosis not present

## 2018-08-18 DIAGNOSIS — M62551 Muscle wasting and atrophy, not elsewhere classified, right thigh: Secondary | ICD-10-CM | POA: Diagnosis not present

## 2018-08-18 DIAGNOSIS — R278 Other lack of coordination: Secondary | ICD-10-CM | POA: Diagnosis not present

## 2018-08-22 DIAGNOSIS — R278 Other lack of coordination: Secondary | ICD-10-CM | POA: Diagnosis not present

## 2018-08-22 DIAGNOSIS — R2689 Other abnormalities of gait and mobility: Secondary | ICD-10-CM | POA: Diagnosis not present

## 2018-08-22 DIAGNOSIS — Z471 Aftercare following joint replacement surgery: Secondary | ICD-10-CM | POA: Diagnosis not present

## 2018-08-22 DIAGNOSIS — M62551 Muscle wasting and atrophy, not elsewhere classified, right thigh: Secondary | ICD-10-CM | POA: Diagnosis not present

## 2018-08-22 DIAGNOSIS — Z96659 Presence of unspecified artificial knee joint: Secondary | ICD-10-CM | POA: Diagnosis not present

## 2018-08-22 DIAGNOSIS — Z4733 Aftercare following explantation of knee joint prosthesis: Secondary | ICD-10-CM | POA: Diagnosis not present

## 2018-08-25 DIAGNOSIS — R278 Other lack of coordination: Secondary | ICD-10-CM | POA: Diagnosis not present

## 2018-08-25 DIAGNOSIS — R2689 Other abnormalities of gait and mobility: Secondary | ICD-10-CM | POA: Diagnosis not present

## 2018-08-25 DIAGNOSIS — Z96659 Presence of unspecified artificial knee joint: Secondary | ICD-10-CM | POA: Diagnosis not present

## 2018-08-25 DIAGNOSIS — Z4733 Aftercare following explantation of knee joint prosthesis: Secondary | ICD-10-CM | POA: Diagnosis not present

## 2018-08-25 DIAGNOSIS — M62551 Muscle wasting and atrophy, not elsewhere classified, right thigh: Secondary | ICD-10-CM | POA: Diagnosis not present

## 2018-08-25 DIAGNOSIS — Z471 Aftercare following joint replacement surgery: Secondary | ICD-10-CM | POA: Diagnosis not present

## 2018-08-29 DIAGNOSIS — R278 Other lack of coordination: Secondary | ICD-10-CM | POA: Diagnosis not present

## 2018-08-29 DIAGNOSIS — M62551 Muscle wasting and atrophy, not elsewhere classified, right thigh: Secondary | ICD-10-CM | POA: Diagnosis not present

## 2018-08-29 DIAGNOSIS — Z471 Aftercare following joint replacement surgery: Secondary | ICD-10-CM | POA: Diagnosis not present

## 2018-08-29 DIAGNOSIS — R2689 Other abnormalities of gait and mobility: Secondary | ICD-10-CM | POA: Diagnosis not present

## 2018-09-01 DIAGNOSIS — M62551 Muscle wasting and atrophy, not elsewhere classified, right thigh: Secondary | ICD-10-CM | POA: Diagnosis not present

## 2018-09-01 DIAGNOSIS — R2689 Other abnormalities of gait and mobility: Secondary | ICD-10-CM | POA: Diagnosis not present

## 2018-09-01 DIAGNOSIS — R278 Other lack of coordination: Secondary | ICD-10-CM | POA: Diagnosis not present

## 2018-09-01 DIAGNOSIS — Z471 Aftercare following joint replacement surgery: Secondary | ICD-10-CM | POA: Diagnosis not present

## 2018-09-04 DIAGNOSIS — R2689 Other abnormalities of gait and mobility: Secondary | ICD-10-CM | POA: Diagnosis not present

## 2018-09-04 DIAGNOSIS — R278 Other lack of coordination: Secondary | ICD-10-CM | POA: Diagnosis not present

## 2018-09-04 DIAGNOSIS — Z471 Aftercare following joint replacement surgery: Secondary | ICD-10-CM | POA: Diagnosis not present

## 2018-09-04 DIAGNOSIS — M62551 Muscle wasting and atrophy, not elsewhere classified, right thigh: Secondary | ICD-10-CM | POA: Diagnosis not present

## 2018-09-08 DIAGNOSIS — R2689 Other abnormalities of gait and mobility: Secondary | ICD-10-CM | POA: Diagnosis not present

## 2018-09-08 DIAGNOSIS — Z471 Aftercare following joint replacement surgery: Secondary | ICD-10-CM | POA: Diagnosis not present

## 2018-09-08 DIAGNOSIS — M62551 Muscle wasting and atrophy, not elsewhere classified, right thigh: Secondary | ICD-10-CM | POA: Diagnosis not present

## 2018-09-08 DIAGNOSIS — R278 Other lack of coordination: Secondary | ICD-10-CM | POA: Diagnosis not present

## 2018-09-09 DIAGNOSIS — Z96651 Presence of right artificial knee joint: Secondary | ICD-10-CM | POA: Diagnosis not present

## 2018-09-09 DIAGNOSIS — Z471 Aftercare following joint replacement surgery: Secondary | ICD-10-CM | POA: Diagnosis not present

## 2018-09-11 DIAGNOSIS — M62551 Muscle wasting and atrophy, not elsewhere classified, right thigh: Secondary | ICD-10-CM | POA: Diagnosis not present

## 2018-09-11 DIAGNOSIS — Z471 Aftercare following joint replacement surgery: Secondary | ICD-10-CM | POA: Diagnosis not present

## 2018-09-11 DIAGNOSIS — R2689 Other abnormalities of gait and mobility: Secondary | ICD-10-CM | POA: Diagnosis not present

## 2018-09-11 DIAGNOSIS — R278 Other lack of coordination: Secondary | ICD-10-CM | POA: Diagnosis not present

## 2018-09-19 DIAGNOSIS — Z471 Aftercare following joint replacement surgery: Secondary | ICD-10-CM | POA: Diagnosis not present

## 2018-09-19 DIAGNOSIS — R278 Other lack of coordination: Secondary | ICD-10-CM | POA: Diagnosis not present

## 2018-09-19 DIAGNOSIS — R2689 Other abnormalities of gait and mobility: Secondary | ICD-10-CM | POA: Diagnosis not present

## 2018-09-19 DIAGNOSIS — M62551 Muscle wasting and atrophy, not elsewhere classified, right thigh: Secondary | ICD-10-CM | POA: Diagnosis not present

## 2018-09-23 DIAGNOSIS — M62551 Muscle wasting and atrophy, not elsewhere classified, right thigh: Secondary | ICD-10-CM | POA: Diagnosis not present

## 2018-09-23 DIAGNOSIS — Z471 Aftercare following joint replacement surgery: Secondary | ICD-10-CM | POA: Diagnosis not present

## 2018-09-23 DIAGNOSIS — R278 Other lack of coordination: Secondary | ICD-10-CM | POA: Diagnosis not present

## 2018-09-23 DIAGNOSIS — R2689 Other abnormalities of gait and mobility: Secondary | ICD-10-CM | POA: Diagnosis not present

## 2018-09-26 DIAGNOSIS — M62551 Muscle wasting and atrophy, not elsewhere classified, right thigh: Secondary | ICD-10-CM | POA: Diagnosis not present

## 2018-09-26 DIAGNOSIS — R2689 Other abnormalities of gait and mobility: Secondary | ICD-10-CM | POA: Diagnosis not present

## 2018-09-26 DIAGNOSIS — Z471 Aftercare following joint replacement surgery: Secondary | ICD-10-CM | POA: Diagnosis not present

## 2018-09-26 DIAGNOSIS — R278 Other lack of coordination: Secondary | ICD-10-CM | POA: Diagnosis not present

## 2018-09-29 DIAGNOSIS — R278 Other lack of coordination: Secondary | ICD-10-CM | POA: Diagnosis not present

## 2018-09-29 DIAGNOSIS — Z471 Aftercare following joint replacement surgery: Secondary | ICD-10-CM | POA: Diagnosis not present

## 2018-09-29 DIAGNOSIS — R2689 Other abnormalities of gait and mobility: Secondary | ICD-10-CM | POA: Diagnosis not present

## 2018-09-29 DIAGNOSIS — M62551 Muscle wasting and atrophy, not elsewhere classified, right thigh: Secondary | ICD-10-CM | POA: Diagnosis not present

## 2018-10-03 DIAGNOSIS — Z471 Aftercare following joint replacement surgery: Secondary | ICD-10-CM | POA: Diagnosis not present

## 2018-10-03 DIAGNOSIS — R278 Other lack of coordination: Secondary | ICD-10-CM | POA: Diagnosis not present

## 2018-10-03 DIAGNOSIS — R2689 Other abnormalities of gait and mobility: Secondary | ICD-10-CM | POA: Diagnosis not present

## 2018-10-03 DIAGNOSIS — M62551 Muscle wasting and atrophy, not elsewhere classified, right thigh: Secondary | ICD-10-CM | POA: Diagnosis not present

## 2018-10-08 ENCOUNTER — Other Ambulatory Visit: Payer: Self-pay

## 2018-10-08 ENCOUNTER — Encounter (INDEPENDENT_AMBULATORY_CARE_PROVIDER_SITE_OTHER): Payer: Medicare Other | Admitting: Ophthalmology

## 2018-10-08 DIAGNOSIS — H43813 Vitreous degeneration, bilateral: Secondary | ICD-10-CM

## 2018-10-08 DIAGNOSIS — H353122 Nonexudative age-related macular degeneration, left eye, intermediate dry stage: Secondary | ICD-10-CM

## 2018-10-08 DIAGNOSIS — H353211 Exudative age-related macular degeneration, right eye, with active choroidal neovascularization: Secondary | ICD-10-CM

## 2018-10-08 DIAGNOSIS — H35033 Hypertensive retinopathy, bilateral: Secondary | ICD-10-CM | POA: Diagnosis not present

## 2018-10-08 DIAGNOSIS — H2512 Age-related nuclear cataract, left eye: Secondary | ICD-10-CM

## 2018-10-08 DIAGNOSIS — I10 Essential (primary) hypertension: Secondary | ICD-10-CM

## 2018-10-15 DIAGNOSIS — R2689 Other abnormalities of gait and mobility: Secondary | ICD-10-CM | POA: Diagnosis not present

## 2018-10-15 DIAGNOSIS — M62551 Muscle wasting and atrophy, not elsewhere classified, right thigh: Secondary | ICD-10-CM | POA: Diagnosis not present

## 2018-10-15 DIAGNOSIS — R278 Other lack of coordination: Secondary | ICD-10-CM | POA: Diagnosis not present

## 2018-10-15 DIAGNOSIS — Z471 Aftercare following joint replacement surgery: Secondary | ICD-10-CM | POA: Diagnosis not present

## 2018-10-30 DIAGNOSIS — M25551 Pain in right hip: Secondary | ICD-10-CM | POA: Diagnosis not present

## 2018-11-06 DIAGNOSIS — I10 Essential (primary) hypertension: Secondary | ICD-10-CM | POA: Diagnosis not present

## 2018-11-06 DIAGNOSIS — R7303 Prediabetes: Secondary | ICD-10-CM | POA: Diagnosis not present

## 2018-11-13 DIAGNOSIS — I10 Essential (primary) hypertension: Secondary | ICD-10-CM | POA: Diagnosis not present

## 2018-11-13 DIAGNOSIS — Z7189 Other specified counseling: Secondary | ICD-10-CM | POA: Diagnosis not present

## 2018-11-13 DIAGNOSIS — R7303 Prediabetes: Secondary | ICD-10-CM | POA: Diagnosis not present

## 2018-11-13 DIAGNOSIS — M8949 Other hypertrophic osteoarthropathy, multiple sites: Secondary | ICD-10-CM | POA: Diagnosis not present

## 2018-11-25 DIAGNOSIS — H401131 Primary open-angle glaucoma, bilateral, mild stage: Secondary | ICD-10-CM | POA: Diagnosis not present

## 2018-11-25 DIAGNOSIS — H3530 Unspecified macular degeneration: Secondary | ICD-10-CM | POA: Diagnosis not present

## 2018-11-25 DIAGNOSIS — H2512 Age-related nuclear cataract, left eye: Secondary | ICD-10-CM | POA: Diagnosis not present

## 2018-11-25 DIAGNOSIS — Z961 Presence of intraocular lens: Secondary | ICD-10-CM | POA: Diagnosis not present

## 2018-11-25 DIAGNOSIS — H353121 Nonexudative age-related macular degeneration, left eye, early dry stage: Secondary | ICD-10-CM | POA: Diagnosis not present

## 2018-11-27 DIAGNOSIS — R600 Localized edema: Secondary | ICD-10-CM | POA: Diagnosis not present

## 2018-11-27 DIAGNOSIS — I10 Essential (primary) hypertension: Secondary | ICD-10-CM | POA: Diagnosis not present

## 2018-11-27 DIAGNOSIS — B379 Candidiasis, unspecified: Secondary | ICD-10-CM | POA: Diagnosis not present

## 2018-12-11 DIAGNOSIS — Z23 Encounter for immunization: Secondary | ICD-10-CM | POA: Diagnosis not present

## 2018-12-12 ENCOUNTER — Other Ambulatory Visit: Payer: Self-pay

## 2018-12-12 ENCOUNTER — Other Ambulatory Visit (HOSPITAL_COMMUNITY): Payer: Self-pay | Admitting: Internal Medicine

## 2018-12-12 ENCOUNTER — Ambulatory Visit (HOSPITAL_COMMUNITY)
Admission: RE | Admit: 2018-12-12 | Discharge: 2018-12-12 | Disposition: A | Discharge status: Home / Self Care | Payer: Medicare Other | Source: Ambulatory Visit | Attending: Family | Admitting: Family

## 2018-12-12 DIAGNOSIS — R600 Localized edema: Secondary | ICD-10-CM

## 2018-12-12 DIAGNOSIS — R609 Edema, unspecified: Secondary | ICD-10-CM | POA: Diagnosis not present

## 2018-12-16 DIAGNOSIS — R600 Localized edema: Secondary | ICD-10-CM | POA: Diagnosis not present

## 2018-12-16 DIAGNOSIS — I872 Venous insufficiency (chronic) (peripheral): Secondary | ICD-10-CM | POA: Diagnosis not present

## 2018-12-16 DIAGNOSIS — Z7189 Other specified counseling: Secondary | ICD-10-CM | POA: Diagnosis not present

## 2018-12-24 ENCOUNTER — Other Ambulatory Visit: Payer: Self-pay

## 2018-12-24 ENCOUNTER — Encounter (INDEPENDENT_AMBULATORY_CARE_PROVIDER_SITE_OTHER): Payer: Medicare Other | Admitting: Ophthalmology

## 2018-12-24 DIAGNOSIS — H353122 Nonexudative age-related macular degeneration, left eye, intermediate dry stage: Secondary | ICD-10-CM

## 2018-12-24 DIAGNOSIS — H2512 Age-related nuclear cataract, left eye: Secondary | ICD-10-CM

## 2018-12-24 DIAGNOSIS — H35033 Hypertensive retinopathy, bilateral: Secondary | ICD-10-CM | POA: Diagnosis not present

## 2018-12-24 DIAGNOSIS — H353211 Exudative age-related macular degeneration, right eye, with active choroidal neovascularization: Secondary | ICD-10-CM

## 2018-12-24 DIAGNOSIS — H43813 Vitreous degeneration, bilateral: Secondary | ICD-10-CM

## 2018-12-24 DIAGNOSIS — I10 Essential (primary) hypertension: Secondary | ICD-10-CM

## 2019-01-08 DIAGNOSIS — Z471 Aftercare following joint replacement surgery: Secondary | ICD-10-CM | POA: Diagnosis not present

## 2019-01-08 DIAGNOSIS — Z96651 Presence of right artificial knee joint: Secondary | ICD-10-CM | POA: Diagnosis not present

## 2019-01-08 DIAGNOSIS — M1611 Unilateral primary osteoarthritis, right hip: Secondary | ICD-10-CM | POA: Diagnosis not present

## 2019-01-08 DIAGNOSIS — M25551 Pain in right hip: Secondary | ICD-10-CM | POA: Diagnosis not present

## 2019-01-27 DIAGNOSIS — Z01818 Encounter for other preprocedural examination: Secondary | ICD-10-CM | POA: Diagnosis not present

## 2019-01-29 DIAGNOSIS — M47812 Spondylosis without myelopathy or radiculopathy, cervical region: Secondary | ICD-10-CM | POA: Diagnosis not present

## 2019-01-29 DIAGNOSIS — M792 Neuralgia and neuritis, unspecified: Secondary | ICD-10-CM | POA: Diagnosis not present

## 2019-01-29 DIAGNOSIS — M8949 Other hypertrophic osteoarthropathy, multiple sites: Secondary | ICD-10-CM | POA: Diagnosis not present

## 2019-01-29 DIAGNOSIS — Z01818 Encounter for other preprocedural examination: Secondary | ICD-10-CM | POA: Diagnosis not present

## 2019-01-29 DIAGNOSIS — M50323 Other cervical disc degeneration at C6-C7 level: Secondary | ICD-10-CM | POA: Diagnosis not present

## 2019-01-29 DIAGNOSIS — R202 Paresthesia of skin: Secondary | ICD-10-CM | POA: Diagnosis not present

## 2019-01-29 DIAGNOSIS — R7303 Prediabetes: Secondary | ICD-10-CM | POA: Diagnosis not present

## 2019-01-29 DIAGNOSIS — I10 Essential (primary) hypertension: Secondary | ICD-10-CM | POA: Diagnosis not present

## 2019-02-04 NOTE — H&P (Signed)
TOTAL HIP ADMISSION H&P  Patient is admitted for right total hip arthroplasty.  Subjective:  Chief Complaint: right hip pain  HPI: Amy Carroll, 84 y.o. female, has a history of pain and functional disability in the right hip(s) due to arthritis and patient has failed non-surgical conservative treatments for greater than 12 weeks to include corticosteriod injections and activity modification.  Onset of symptoms was gradual starting several years ago with gradually worsening course since that time.The patient noted no past surgery on the right hip(s).  Patient currently rates pain in the right hip at 6 out of 10 with activity. Patient has worsening of pain with activity and weight bearing, pain that interfers with activities of daily living and crepitus. Patient has evidence of severe end-stage osteoarthritis of the right hip with bone-on-bone degeneration throughout, large subchondral cysts, and marginal osteophytes by imaging studies. This condition presents safety issues increasing the risk of falls. There is no current active infection.  Patient Active Problem List   Diagnosis Date Noted  . Failed total knee arthroplasty ([facility]) 08/06/2018  . Failed total right knee replacement ([facility]) 08/06/2018  . Allergic contact dermatitis due to metals 03/17/2018  . Presence of right artificial knee joint 04/02/2016  . Effusion, right knee 04/02/2016  . Osteoarthritis of right knee 05/27/2012    Class: Diagnosis of   Past Medical History:  Diagnosis Date  . Arthritis   . Hypertension     Past Surgical History:  Procedure Laterality Date  . ABDOMINAL HYSTERECTOMY    . COLONOSCOPY    . HEMANGIOMA EXCISION    . JOINT REPLACEMENT    . TONSILLECTOMY    . TOTAL KNEE ARTHROPLASTY Right 05/27/2012   Procedure: TOTAL KNEE ARTHROPLASTY;  Surgeon: Meredith Pel, MD;  Location: [facility];  Service: Orthopedics;  Laterality: Right;  Right Total Knee Arthroplasty  . TOTAL KNEE REVISION Right 08/06/2018   Procedure: TOTAL KNEE REVISION;  Surgeon: Gaynelle Arabian, MD;  Location: [facility];  Service: Orthopedics;  Laterality: Right;  . TUBAL LIGATION      No current facility-administered medications for this encounter.    Current Outpatient Medications  Medication Sig Dispense Refill Last Dose  . acetaminophen (TYLENOL) 325 MG tablet Take 650 mg by mouth every 6 (six) hours as needed for moderate pain.   08/05/2018 at Unknown time  . amLODipine (NORVASC) 5 MG tablet Take 5 mg by mouth daily.    08/06/2018 at 0730  . cholecalciferol (VITAMIN D3) 25 MCG (1000 UT) tablet Take 1,000 Units by mouth daily.   08/01/2018  . COMBIGAN 0.2-0.5 % ophthalmic solution Place 1 drop into the right eye every 12 (twelve) hours.    08/06/2018 at 0730  . diazepam (VALIUM) 5 MG tablet 1 po qhs prn (Patient not taking: Reported on 03/17/2018) 35 tablet 0 Not Taking  . dorzolamide (TRUSOPT) 2 % ophthalmic solution Place 1 drop into the right eye 2 (two) times daily.    08/06/2018 at Unknown time  . latanoprost (XALATAN) 0.005 % ophthalmic solution Place 1 drop into both eyes at bedtime.    08/05/2018  . methocarbamol (ROBAXIN) 500 MG tablet Take 1 tablet (500 mg total) by mouth every 6 (six) hours as needed for muscle spasms. 40 tablet 0   . Multiple Vitamins-Minerals (PRESERVISION AREDS 2 PO) Take 2 capsules by mouth daily.   08/01/2018  . olmesartan-hydrochlorothiazide (BENICAR HCT) 40-25 MG tablet Take 1 tablet by mouth daily.   08/01/2018 at Unknown time  . oxyCODONE (OXY  IR/ROXICODONE) 5 MG immediate release tablet Take 1-2 tablets (5-10 mg total) by mouth every 6 (six) hours as needed for severe pain. 56 tablet 0   . traMADol (ULTRAM) 50 MG tablet Take 1-2 tablets (50-100 mg total) by mouth every 6 (six) hours as needed for moderate pain. 40 tablet 0    No Known Allergies  Social History   Tobacco Use  . Smoking status: Former Smoker    Types: Cigarettes    Quit date: 1999    Years since quitting: 21.9  . Smokeless tobacco:  Never Used  Substance Use Topics  . Alcohol use: Yes    Alcohol/week: 1.0 standard drinks    Types: 1 Glasses of wine per week    Comment: daily    Family History  Problem Relation Age of Onset  . Cancer Mother   . Heart disease Father   . Cancer Brother   . Asthma Brother      Review of Systems  Constitutional: Negative for chills and fever.  HENT: Negative for congestion, sore throat and tinnitus.   Eyes: Negative for double vision, photophobia and pain.  Respiratory: Negative for cough, shortness of breath and wheezing.   Cardiovascular: Negative for chest pain, palpitations and orthopnea.  Gastrointestinal: Negative for heartburn, nausea and vomiting.  Genitourinary: Negative for dysuria, frequency and urgency.  Musculoskeletal: Positive for joint pain.  Neurological: Negative for dizziness, weakness and headaches.    Objective:  Physical Exam  Well nourished and well developed.  General: Alert and oriented x3, cooperative and pleasant, no acute distress.  Head: normocephalic, atraumatic, neck supple.  Eyes: EOMI.  Respiratory: breath sounds clear in all fields, no wheezing, rales, or rhonchi. Cardiovascular: Regular rate and rhythm, no murmurs, gallops or rubs.  Abdomen: non-tender to palpation and soft, normoactive bowel sounds. Musculoskeletal:  Right Hip Exam: The range of motion: Flexion to 100 degrees, Internal Rotation is minimal , External Rotation to 20 degrees, and abduction to 20 degrees with pain.  There is no tenderness over the greater trochanteric bursa.  There is no pain on provocative testing of the hip.  Calves soft and nontender. Motor function intact in LE. Strength 5/5 LE bilaterally. Neuro: Distal pulses 2+. Sensation to light touch intact in LE.   Vital signs in last 24 hours: Blood pressure: 136/78 mmHg Pulse: 80 bpm  Labs:   Estimated body mass index is 24.59 kg/m as calculated from the following:   Height as of 08/06/18: 5\' 4"   (1.626 m).   Weight as of 08/06/18: 65 kg.   Imaging Review Plain radiographs demonstrate severe degenerative joint disease of the right hip(s). The bone quality appears to be adequate for age and reported activity level.  Assessment/Plan:  End stage arthritis, right hip(s)  The patient history, physical examination, clinical judgement of the provider and imaging studies are consistent with end stage degenerative joint disease of the right hip(s) and total hip arthroplasty is deemed medically necessary. The treatment options including medical management, injection therapy, arthroscopy and arthroplasty were discussed at length. The risks and benefits of total hip arthroplasty were presented and reviewed. The risks due to aseptic loosening, infection, stiffness, dislocation/subluxation,  thromboembolic complications and other imponderables were discussed.  The patient acknowledged the explanation, agreed to proceed with the plan and consent was signed. Patient is being admitted for inpatient treatment for surgery, pain control, PT, OT, prophylactic antibiotics, VTE prophylaxis, progressive ambulation and ADL's and discharge planning.The patient is planning to be discharged home.  Anticipated LOS equal to or greater than 2 midnights due to - Age 72 and older with one or more of the following:  - Obesity  - Expected need for hospital services (PT, OT, Nursing) required for safe  discharge  - Anticipated need for postoperative skilled nursing care or inpatient rehab  - Active co-morbidities: None OR   - Unanticipated findings during/Post Surgery: None  - Patient is a high risk of re-admission due to: None  Therapy Plans: Home to [facility] with [facility]P Disposition: Home with husband Planned DVT Prophylaxis: Aspirin 325 mg BID DME needed: [facility] PCP: Merrilee Seashore, MD TXA: IV Allergies: NKDA Anesthesia Concerns: None BMI: 25  - Patient was instructed on what medications to stop prior to  surgery. - Follow-up visit in 2 weeks with Dr. Wynelle Link - Begin physical therapy following surgery - Pre-operative lab work as pre-surgical testing - Prescriptions will be provided in hospital at time of discharge  Theresa Duty, PA-C Orthopedic Surgery [facility] [place]

## 2019-02-12 NOTE — Patient Instructions (Addendum)
DUE TO COVID-19 ONLY ONE VISITOR IS ALLOWED TO COME WITH YOU AND STAY IN THE WAITING ROOM ONLY DURING PRE OP AND PROCEDURE DAY OF SURGERY. THE 1 VISITOR MAY VISIT WITH YOU AFTER SURGERY IN YOUR PRIVATE ROOM DURING VISITING HOURS ONLY!  YOU NEED TO HAVE A COVID 19 TEST ON__Sat 12/19_____ @__10 :30_____, THIS TEST MUST BE DONE BEFORE SURGERY, COME  [address], [place] , [address].  ([place]) ONCE YOUR COVID TEST IS COMPLETED, PLEASE BEGIN THE QUARANTINE INSTRUCTIONS AS OUTLINED IN YOUR HANDOUT.                Amy Carroll    Your procedure is scheduled on: 02/18/19   Report to [facility] Main  Entrance   Report to admitting at  6:40 AM     Call this number if you have problems the morning of surgery [place], NO CHEWING GUM [place].   Do not eat food After Midnight.   YOU MAY HAVE CLEAR LIQUIDS FROM MIDNIGHT UNTIL 6:00 AM.   At 6:30 AM Please finish the prescribed Pre-Surgery  drink  . Nothing by mouth after you finish the  drink !   Take these medicines the morning of surgery with A SIP OF WATER: Amlodipine, eye drops                                 You may not have any metal on your body including hair pins and              piercings  Do not wear jewelry, make-up, lotions, powders or perfumes, deodorant             Do not wear nail polish on your fingernails.  Do not shave  48 hours prior to surgery.               Do not bring valuables to the hospital. [place].  Contacts, dentures or bridgework may not be worn into surgery.       Patients discharged the day of surgery will not be allowed to drive home.  IF YOU ARE HAVING SURGERY AND GOING HOME THE SAME DAY, YOU MUST HAVE AN ADULT TO DRIVE YOU HOME AND BE WITH YOU FOR 24 HOURS.  YOU MAY GO HOME BY TAXI OR UBER OR ORTHERWISE, BUT AN ADULT MUST ACCOMPANY YOU  HOME AND STAY WITH YOU FOR 24 HOURS.  Name and phone number of your driver:  Special Instructions: N/A              Please read over the following fact sheets you were given: _____________________________________________________________________             [facility] - Preparing for Surgery  Before surgery, you can play an important role.   Because skin is not sterile, your skin needs to be as free of germs as possible .  You can reduce the number of germs on your skin by washing with CHG (chlorahexidine gluconate) soap before surgery.   CHG is an antiseptic cleaner which kills germs and bonds with the skin to continue killing germs even after washing. Please DO NOT use if you have an allergy to CHG or antibacterial soaps.  If your skin becomes reddened/irritated stop using the CHG and inform your nurse when you arrive at Short Stay. Do not shave (including legs and underarms) for at least 48 hours prior to the first CHG shower. Please follow these instructions carefully:  1.  Shower with CHG Soap the night before surgery and the  morning of Surgery.  2.  If you choose to wash your hair, wash your hair first as usual with your  normal  shampoo.  3.  After you shampoo, rinse your hair and body thoroughly to remove the  shampoo.                                        4.  Use CHG as you would any other liquid soap.  You can apply chg directly  to the skin and wash                       Gently with a scrungie or clean washcloth.  5.  Apply the CHG Soap to your body ONLY FROM THE NECK DOWN.   Do not use on face/ open                           Wound or open sores. Avoid contact with eyes, ears mouth and genitals (private parts).                       Wash face,  Genitals (private parts) with your normal soap.             6.  Wash thoroughly, paying special attention to the area where your surgery  will be performed.  7.  Thoroughly rinse your body with warm water from the neck down.  8.  DO  NOT shower/wash with your normal soap after using and rinsing off  the CHG Soap.             9.  Pat yourself dry with a clean towel.            10.  Wear clean pajamas.            11.  Place clean sheets on your bed the night of your first shower and do not  sleep with pets. Day of Surgery : Do not apply any lotions/deodorants the morning of surgery.  Please wear clean clothes to the hospital/surgery center.  FAILURE TO FOLLOW THESE INSTRUCTIONS MAY RESULT IN THE CANCELLATION OF YOUR SURGERY PATIENT SIGNATURE_________________________________  NURSE SIGNATURE__________________________________  ________________________________________________________________________   Amy Carroll  An incentive spirometer is a tool that can help keep your lungs clear and active. This tool measures how well you are filling your lungs with each breath. Taking long deep breaths may help reverse or decrease the chance of developing breathing (pulmonary) problems (especially infection) following:  A long period of time when you are unable to move or be active. BEFORE THE PROCEDURE   If the spirometer includes an indicator to show your best effort, your nurse or respiratory therapist will set it to a desired goal.  If possible, sit up straight or lean slightly forward. Try not to slouch.  Hold the incentive spirometer in an upright position. INSTRUCTIONS FOR USE  1. Sit on the edge of your bed if possible, or sit up as far as you can in  bed or on a chair. 2. Hold the incentive spirometer in an upright position. 3. Breathe out normally. 4. Place the mouthpiece in your mouth and seal your lips tightly around it. 5. Breathe in slowly and as deeply as possible, raising the piston or the ball toward the top of the column. 6. Hold your breath for 3-5 seconds or for as long as possible. Allow the piston or ball to fall to the bottom of the column. 7. Remove the mouthpiece from your mouth and breathe out  normally. 8. Rest for a few seconds and repeat Steps 1 through 7 at least 10 times every 1-2 hours when you are awake. Take your time and take a few normal breaths between deep breaths. 9. The spirometer may include an indicator to show your best effort. Use the indicator as a goal to work toward during each repetition. 10. After each set of 10 deep breaths, practice coughing to be sure your lungs are clear. If you have an incision (the cut made at the time of surgery), support your incision when coughing by placing a pillow or rolled up towels firmly against it. Once you are able to get out of bed, walk around indoors and cough well. You may stop using the incentive spirometer when instructed by your caregiver.  RISKS AND COMPLICATIONS  Take your time so you do not get dizzy or light-headed.  If you are in pain, you may need to take or ask for pain medication before doing incentive spirometry. It is harder to take a deep breath if you are having pain. AFTER USE  Rest and breathe slowly and easily.  It can be helpful to keep track of a log of your progress. Your caregiver can provide you with a simple table to help with this. If you are using the spirometer at home, follow these instructions: [place] IF:   You are having difficultly using the spirometer.  You have trouble using the spirometer as often as instructed.  Your pain medication is not giving enough relief while using the spirometer.  You develop fever of 100.5 F (38.1 C) or higher. SEEK IMMEDIATE MEDICAL CARE IF:   You cough up bloody sputum that had not been present before.  You develop fever of 102 F (38.9 C) or greater.  You develop worsening pain at or near the incision site. MAKE SURE YOU:   Understand these instructions.  Will watch your condition.  Will get help right away if you are not doing well or get worse. Document Released: 06/25/2006 Document Revised: 05/07/2011 Document Reviewed:  08/26/2006 [facility] Patient Information 2014 [place], [place].   ________________________________________________________________________

## 2019-02-13 ENCOUNTER — Encounter (HOSPITAL_COMMUNITY)
Admission: RE | Admit: 2019-02-13 | Discharge: 2019-02-13 | Disposition: A | Discharge status: Home / Self Care | Payer: Medicare Other | Source: Ambulatory Visit | Attending: Orthopedic Surgery | Admitting: Orthopedic Surgery

## 2019-02-13 ENCOUNTER — Other Ambulatory Visit: Payer: Self-pay

## 2019-02-13 ENCOUNTER — Encounter (HOSPITAL_COMMUNITY): Payer: Self-pay

## 2019-02-13 DIAGNOSIS — Z01812 Encounter for preprocedural laboratory examination: Secondary | ICD-10-CM | POA: Insufficient documentation

## 2019-02-13 NOTE — Progress Notes (Signed)
PCP - Dr. Roselle Locus Cardiologist - no  Chest x-ray - no EKG - 08/01/18 Stress Test - no ECHO - no Cardiac Cath - no DVT US-neg  Sleep Study - no CPAP -   Fasting Blood Sugar - NA Checks Blood Sugar _____ times a day  Blood Thinner Instructions:NA Aspirin Instructions: Last Dose:  Anesthesia review:   Patient denies shortness of breath, fever, cough and chest pain at PAT appointment yes  Patient verbalized understanding of instructions that were given to them at the PAT appointment. Patient was also instructed that they will need to review over the PAT instructions again at home before surgery. yes

## 2019-02-14 ENCOUNTER — Other Ambulatory Visit (HOSPITAL_COMMUNITY)
Admission: RE | Admit: 2019-02-14 | Discharge: 2019-02-14 | Disposition: A | Discharge status: Home / Self Care | Payer: Medicare Other | Source: Ambulatory Visit | Attending: Orthopedic Surgery | Admitting: Orthopedic Surgery

## 2019-02-14 DIAGNOSIS — Z01812 Encounter for preprocedural laboratory examination: Secondary | ICD-10-CM | POA: Diagnosis present

## 2019-02-14 DIAGNOSIS — Z20828 Contact with and (suspected) exposure to other viral communicable diseases: Secondary | ICD-10-CM | POA: Diagnosis not present

## 2019-02-15 LAB — NOVEL CORONAVIRUS, NAA (HOSP ORDER, SEND-OUT TO REF LAB; TAT 18-24 HRS): SARS-CoV-2, NAA: NOT DETECTED

## 2019-02-16 ENCOUNTER — Other Ambulatory Visit: Payer: Self-pay

## 2019-02-16 ENCOUNTER — Encounter (HOSPITAL_COMMUNITY)
Admission: RE | Admit: 2019-02-16 | Discharge: 2019-02-16 | Disposition: A | Discharge status: Home / Self Care | Payer: Medicare Other | Source: Ambulatory Visit | Attending: Orthopedic Surgery | Admitting: Orthopedic Surgery

## 2019-02-16 DIAGNOSIS — Z01812 Encounter for preprocedural laboratory examination: Secondary | ICD-10-CM | POA: Diagnosis not present

## 2019-02-16 LAB — COMPREHENSIVE METABOLIC PANEL
ALT: 23 U/L (ref 0–44)
AST: 16 U/L (ref 15–41)
Albumin: 4.1 g/dL (ref 3.5–5.0)
Alkaline Phosphatase: 59 U/L (ref 38–126)
Anion gap: 10 (ref 5–15)
BUN: 38 mg/dL — ABNORMAL HIGH (ref 8–23)
CO2: 24 mmol/L (ref 22–32)
Calcium: 8.8 mg/dL — ABNORMAL LOW (ref 8.9–10.3)
Chloride: 102 mmol/L (ref 98–111)
Creatinine, Ser: 0.98 mg/dL (ref 0.44–1.00)
GFR calc Af Amer: >60 mL/min (ref >60)
GFR calc non Af Amer: 54 mL/min — ABNORMAL LOW (ref >60)
Glucose, Bld: 101 mg/dL — ABNORMAL HIGH (ref 70–99)
Potassium: 4 mmol/L (ref 3.5–5.1)
Sodium: 136 mmol/L (ref 135–145)
Total Bilirubin: 0.7 mg/dL (ref 0.3–1.2)
Total Protein: 7 g/dL (ref 6.5–8.1)

## 2019-02-16 LAB — CBC
HCT: 36.5 % (ref 36.0–46.0)
Hemoglobin: 11.7 g/dL — ABNORMAL LOW (ref 12.0–15.0)
MCH: 32.9 pg (ref 26.0–34.0)
MCHC: 32.1 g/dL (ref 30.0–36.0)
MCV: 102.5 fL — ABNORMAL HIGH (ref 80.0–100.0)
Platelets: 256 10*3/uL (ref 150–400)
RBC: 3.56 MIL/uL — ABNORMAL LOW (ref 3.87–5.11)
RDW: 12.4 % (ref 11.5–15.5)
WBC: 5 10*3/uL (ref 4.0–10.5)
nRBC: 0 % (ref 0.0–0.2)

## 2019-02-16 LAB — SURGICAL PCR SCREEN
MRSA, PCR: NEGATIVE
Staphylococcus aureus: NEGATIVE

## 2019-02-16 LAB — PROTIME-INR
INR: 0.9 (ref 0.8–1.2)
Prothrombin Time: 12.4 seconds (ref 11.4–15.2)

## 2019-02-16 LAB — APTT: aPTT: 28 seconds (ref 24–36)

## 2019-02-18 ENCOUNTER — Inpatient Hospital Stay (HOSPITAL_COMMUNITY): Payer: Medicare Other | Admitting: Physician Assistant

## 2019-02-18 ENCOUNTER — Encounter (HOSPITAL_COMMUNITY)
Admission: RE | Disposition: A | Discharge status: Home Health | Payer: Self-pay | Source: Home / Self Care | Attending: Orthopedic Surgery

## 2019-02-18 ENCOUNTER — Inpatient Hospital Stay (HOSPITAL_COMMUNITY): Payer: Medicare Other

## 2019-02-18 ENCOUNTER — Other Ambulatory Visit: Payer: Self-pay

## 2019-02-18 ENCOUNTER — Inpatient Hospital Stay (HOSPITAL_COMMUNITY)
Admission: RE | Admit: 2019-02-18 | Discharge: 2019-02-20 | DRG: 470 | Disposition: A | Discharge status: Home Health | Payer: Medicare Other | Attending: Orthopedic Surgery | Admitting: Orthopedic Surgery

## 2019-02-18 ENCOUNTER — Encounter (HOSPITAL_COMMUNITY): Payer: Self-pay | Admitting: Orthopedic Surgery

## 2019-02-18 DIAGNOSIS — Z96649 Presence of unspecified artificial hip joint: Secondary | ICD-10-CM

## 2019-02-18 DIAGNOSIS — I10 Essential (primary) hypertension: Secondary | ICD-10-CM | POA: Diagnosis present

## 2019-02-18 DIAGNOSIS — Z8249 Family history of ischemic heart disease and other diseases of the circulatory system: Secondary | ICD-10-CM

## 2019-02-18 DIAGNOSIS — Z809 Family history of malignant neoplasm, unspecified: Secondary | ICD-10-CM

## 2019-02-18 DIAGNOSIS — M1611 Unilateral primary osteoarthritis, right hip: Principal | ICD-10-CM | POA: Diagnosis present

## 2019-02-18 DIAGNOSIS — Z6824 Body mass index (BMI) 24.0-24.9, adult: Secondary | ICD-10-CM

## 2019-02-18 DIAGNOSIS — E669 Obesity, unspecified: Secondary | ICD-10-CM | POA: Diagnosis present

## 2019-02-18 DIAGNOSIS — Z825 Family history of asthma and other chronic lower respiratory diseases: Secondary | ICD-10-CM

## 2019-02-18 DIAGNOSIS — Z20828 Contact with and (suspected) exposure to other viral communicable diseases: Secondary | ICD-10-CM | POA: Diagnosis present

## 2019-02-18 DIAGNOSIS — Z419 Encounter for procedure for purposes other than remedying health state, unspecified: Secondary | ICD-10-CM

## 2019-02-18 DIAGNOSIS — Z87891 Personal history of nicotine dependence: Secondary | ICD-10-CM

## 2019-02-18 DIAGNOSIS — M169 Osteoarthritis of hip, unspecified: Secondary | ICD-10-CM

## 2019-02-18 DIAGNOSIS — Z96651 Presence of right artificial knee joint: Secondary | ICD-10-CM | POA: Diagnosis present

## 2019-02-18 HISTORY — PX: TOTAL HIP ARTHROPLASTY: SHX124

## 2019-02-18 LAB — TYPE AND SCREEN
ABO/RH(D): A POS
Antibody Screen: NEGATIVE

## 2019-02-18 SURGERY — ARTHROPLASTY, HIP, TOTAL, ANTERIOR APPROACH
Anesthesia: Spinal | Site: Hip | Laterality: Right

## 2019-02-18 MED ORDER — PHENYLEPHRINE HCL (PRESSORS) 10 MG/ML IV SOLN
INTRAVENOUS | Status: AC
  Filled 2019-02-18: qty 1

## 2019-02-18 MED ORDER — PHENYLEPHRINE HCL-NACL 10-0.9 MG/250ML-% IV SOLN
INTRAVENOUS | Status: DC | PRN
  Administered 2019-02-18: 25 ug/min via INTRAVENOUS

## 2019-02-18 MED ORDER — ACETAMINOPHEN 10 MG/ML IV SOLN: 1000.0000 mg | Freq: Once | INTRAVENOUS | Status: DC | PRN

## 2019-02-18 MED ORDER — METHOCARBAMOL 500 MG IVPB - SIMPLE MED
INTRAVENOUS | Status: AC
  Filled 2019-02-18: qty 50

## 2019-02-18 MED ORDER — TRANEXAMIC ACID-NACL 1000-0.7 MG/100ML-% IV SOLN
INTRAVENOUS | Status: AC
  Filled 2019-02-18: qty 100

## 2019-02-18 MED ORDER — TRANEXAMIC ACID-NACL 1000-0.7 MG/100ML-% IV SOLN
1000.0000 mg | INTRAVENOUS | Status: AC
  Administered 2019-02-18: 09:00:00 1000 mg via INTRAVENOUS

## 2019-02-18 MED ORDER — HYDROCODONE-ACETAMINOPHEN 5-325 MG PO TABS
1.0000 | ORAL_TABLET | ORAL | Status: DC | PRN
  Administered 2019-02-18: 2 via ORAL
  Administered 2019-02-19 – 2019-02-20 (×6): 1 via ORAL
  Filled 2019-02-18 (×3): qty 1
  Filled 2019-02-18: qty 2
  Filled 2019-02-18 (×3): qty 1

## 2019-02-18 MED ORDER — SODIUM CHLORIDE 0.9 % IV SOLN: INTRAVENOUS | Status: DC

## 2019-02-18 MED ORDER — BRIMONIDINE TARTRATE 0.2 % OP SOLN
1.0000 [drp] | Freq: Every day | OPHTHALMIC | Status: DC
  Filled 2019-02-18: qty 5

## 2019-02-18 MED ORDER — DOCUSATE SODIUM 100 MG PO CAPS
100.0000 mg | ORAL_CAPSULE | Freq: Two times a day (BID) | ORAL | Status: DC
  Administered 2019-02-18 – 2019-02-20 (×4): 100 mg via ORAL
  Filled 2019-02-18 (×5): qty 1

## 2019-02-18 MED ORDER — WATER FOR IRRIGATION, STERILE IR SOLN
Status: DC | PRN
  Administered 2019-02-18: 2000 mL

## 2019-02-18 MED ORDER — BRIMONIDINE TARTRATE-TIMOLOL 0.2-0.5 % OP SOLN: 1.0000 [drp] | Freq: Every day | OPHTHALMIC | Status: DC

## 2019-02-18 MED ORDER — FLEET ENEMA 7-19 GM/118ML RE ENEM: 1.0000 | ENEMA | Freq: Once | RECTAL | Status: DC | PRN

## 2019-02-18 MED ORDER — PROPOFOL 10 MG/ML IV BOLUS
INTRAVENOUS | Status: AC
  Filled 2019-02-18: qty 20

## 2019-02-18 MED ORDER — METOCLOPRAMIDE HCL 5 MG/ML IJ SOLN: 5.0000 mg | Freq: Three times a day (TID) | INTRAMUSCULAR | Status: DC | PRN

## 2019-02-18 MED ORDER — EPHEDRINE SULFATE-NACL 50-0.9 MG/10ML-% IV SOSY
PREFILLED_SYRINGE | INTRAVENOUS | Status: DC | PRN
  Administered 2019-02-18: 10 mg via INTRAVENOUS
  Administered 2019-02-18: 5 mg via INTRAVENOUS

## 2019-02-18 MED ORDER — SODIUM CHLORIDE (PF) 0.9 % IJ SOLN
INTRAMUSCULAR | Status: AC
  Filled 2019-02-18: qty 50

## 2019-02-18 MED ORDER — TIMOLOL MALEATE 0.5 % OP SOLN
1.0000 [drp] | Freq: Every day | OPHTHALMIC | Status: DC
  Filled 2019-02-18: qty 5

## 2019-02-18 MED ORDER — PHENOL 1.4 % MT LIQD
1.0000 | OROMUCOSAL | Status: DC | PRN
  Filled 2019-02-18: qty 177

## 2019-02-18 MED ORDER — METOCLOPRAMIDE HCL 5 MG PO TABS: 5.0000 mg | ORAL_TABLET | Freq: Three times a day (TID) | ORAL | Status: DC | PRN

## 2019-02-18 MED ORDER — CEFAZOLIN SODIUM-DEXTROSE 2-4 GM/100ML-% IV SOLN
INTRAVENOUS | Status: AC
  Filled 2019-02-18: qty 100

## 2019-02-18 MED ORDER — 0.9 % SODIUM CHLORIDE (POUR BTL) OPTIME
TOPICAL | Status: DC | PRN
  Administered 2019-02-18: 10:00:00 1000 mL

## 2019-02-18 MED ORDER — PROPOFOL 10 MG/ML IV BOLUS
INTRAVENOUS | Status: DC | PRN
  Administered 2019-02-18: 20 mg via INTRAVENOUS
  Administered 2019-02-18 (×3): 10 mg via INTRAVENOUS
  Administered 2019-02-18: 20 mg via INTRAVENOUS

## 2019-02-18 MED ORDER — OLMESARTAN MEDOXOMIL-HCTZ 40-25 MG PO TABS: 1.0000 | ORAL_TABLET | Freq: Every day | ORAL | Status: DC

## 2019-02-18 MED ORDER — CEFAZOLIN SODIUM-DEXTROSE 2-4 GM/100ML-% IV SOLN
2.0000 g | INTRAVENOUS | Status: AC
  Administered 2019-02-18: 2 g via INTRAVENOUS

## 2019-02-18 MED ORDER — CEFAZOLIN SODIUM-DEXTROSE 1-4 GM/50ML-% IV SOLN
1.0000 g | Freq: Four times a day (QID) | INTRAVENOUS | Status: AC
  Administered 2019-02-18 (×2): 1 g via INTRAVENOUS
  Filled 2019-02-18 (×3): qty 50

## 2019-02-18 MED ORDER — PROPOFOL 500 MG/50ML IV EMUL
INTRAVENOUS | Status: AC
  Filled 2019-02-18: qty 50

## 2019-02-18 MED ORDER — MEPERIDINE HCL 50 MG/ML IJ SOLN: 6.2500 mg | INTRAMUSCULAR | Status: DC | PRN

## 2019-02-18 MED ORDER — MENTHOL 3 MG MT LOZG: 1.0000 | LOZENGE | OROMUCOSAL | Status: DC | PRN

## 2019-02-18 MED ORDER — BUPIVACAINE HCL (PF) 0.5 % IJ SOLN
INTRAMUSCULAR | Status: AC
  Filled 2019-02-18: qty 30

## 2019-02-18 MED ORDER — DEXAMETHASONE SODIUM PHOSPHATE 10 MG/ML IJ SOLN
INTRAMUSCULAR | Status: AC
  Filled 2019-02-18: qty 1

## 2019-02-18 MED ORDER — PROMETHAZINE HCL 25 MG/ML IJ SOLN: 6.2500 mg | INTRAMUSCULAR | Status: DC | PRN

## 2019-02-18 MED ORDER — DORZOLAMIDE HCL 2 % OP SOLN
1.0000 [drp] | Freq: Two times a day (BID) | OPHTHALMIC | Status: DC
  Administered 2019-02-18: 1 [drp] via OPHTHALMIC
  Filled 2019-02-18: qty 10

## 2019-02-18 MED ORDER — ASPIRIN EC 325 MG PO TBEC
325.0000 mg | DELAYED_RELEASE_TABLET | Freq: Two times a day (BID) | ORAL | Status: DC
  Administered 2019-02-19 – 2019-02-20 (×3): 325 mg via ORAL
  Filled 2019-02-18 (×3): qty 1

## 2019-02-18 MED ORDER — IRBESARTAN 150 MG PO TABS
300.0000 mg | ORAL_TABLET | Freq: Every day | ORAL | Status: DC
  Administered 2019-02-19 – 2019-02-20 (×2): 300 mg via ORAL
  Filled 2019-02-18 (×2): qty 2

## 2019-02-18 MED ORDER — DEXAMETHASONE SODIUM PHOSPHATE 10 MG/ML IJ SOLN
10.0000 mg | Freq: Once | INTRAMUSCULAR | Status: AC
  Administered 2019-02-19: 10 mg via INTRAVENOUS
  Filled 2019-02-18: qty 1

## 2019-02-18 MED ORDER — CHLORHEXIDINE GLUCONATE 4 % EX LIQD: 60.0000 mL | Freq: Once | CUTANEOUS | Status: DC

## 2019-02-18 MED ORDER — LATANOPROST 0.005 % OP SOLN
1.0000 [drp] | Freq: Every day | OPHTHALMIC | Status: DC
  Administered 2019-02-18: 1 [drp] via OPHTHALMIC
  Filled 2019-02-18: qty 2.5

## 2019-02-18 MED ORDER — ONDANSETRON HCL 4 MG PO TABS: 4.0000 mg | ORAL_TABLET | Freq: Four times a day (QID) | ORAL | Status: DC | PRN

## 2019-02-18 MED ORDER — AMLODIPINE BESYLATE 5 MG PO TABS
5.0000 mg | ORAL_TABLET | Freq: Every day | ORAL | Status: DC
  Administered 2019-02-19 – 2019-02-20 (×2): 5 mg via ORAL
  Filled 2019-02-18 (×2): qty 1

## 2019-02-18 MED ORDER — BISACODYL 10 MG RE SUPP: 10.0000 mg | Freq: Every day | RECTAL | Status: DC | PRN

## 2019-02-18 MED ORDER — ACETAMINOPHEN 10 MG/ML IV SOLN
1000.0000 mg | Freq: Four times a day (QID) | INTRAVENOUS | Status: DC
  Administered 2019-02-18: 1000 mg via INTRAVENOUS

## 2019-02-18 MED ORDER — ACETAMINOPHEN 500 MG PO TABS
500.0000 mg | ORAL_TABLET | Freq: Four times a day (QID) | ORAL | Status: AC
  Administered 2019-02-18 – 2019-02-19 (×4): 500 mg via ORAL
  Filled 2019-02-18 (×4): qty 1

## 2019-02-18 MED ORDER — HYDROCHLOROTHIAZIDE 25 MG PO TABS
25.0000 mg | ORAL_TABLET | Freq: Every day | ORAL | Status: DC
  Administered 2019-02-19 – 2019-02-20 (×2): 25 mg via ORAL
  Filled 2019-02-18 (×2): qty 1

## 2019-02-18 MED ORDER — DEXAMETHASONE SODIUM PHOSPHATE 10 MG/ML IJ SOLN
8.0000 mg | Freq: Once | INTRAMUSCULAR | Status: AC
  Administered 2019-02-18: 09:00:00 8 mg via INTRAVENOUS

## 2019-02-18 MED ORDER — HYDROMORPHONE HCL 1 MG/ML IJ SOLN
0.2500 mg | INTRAMUSCULAR | Status: DC | PRN
  Administered 2019-02-18 (×2): 0.5 mg via INTRAVENOUS

## 2019-02-18 MED ORDER — METHOCARBAMOL 500 MG IVPB - SIMPLE MED
500.0000 mg | Freq: Four times a day (QID) | INTRAVENOUS | Status: DC | PRN
  Administered 2019-02-18: 500 mg via INTRAVENOUS
  Filled 2019-02-18: qty 50

## 2019-02-18 MED ORDER — LACTATED RINGERS IV SOLN: INTRAVENOUS | Status: DC

## 2019-02-18 MED ORDER — DIPHENHYDRAMINE HCL 12.5 MG/5ML PO ELIX: 12.5000 mg | ORAL_SOLUTION | ORAL | Status: DC | PRN

## 2019-02-18 MED ORDER — POVIDONE-IODINE 10 % EX SWAB
2.0000 "application " | Freq: Once | CUTANEOUS | Status: AC
  Administered 2019-02-18: 2 via TOPICAL

## 2019-02-18 MED ORDER — BUPIVACAINE HCL (PF) 0.75 % IJ SOLN
INTRAMUSCULAR | Status: DC | PRN
  Administered 2019-02-18: 1.4 mL

## 2019-02-18 MED ORDER — BUPIVACAINE HCL (PF) 0.5 % IJ SOLN
INTRAMUSCULAR | Status: DC | PRN
  Administered 2019-02-18: 30 mL

## 2019-02-18 MED ORDER — ACETAMINOPHEN 10 MG/ML IV SOLN
INTRAVENOUS | Status: AC
  Filled 2019-02-18: qty 100

## 2019-02-18 MED ORDER — POLYETHYLENE GLYCOL 3350 17 G PO PACK: 17.0000 g | PACK | Freq: Every day | ORAL | Status: DC | PRN

## 2019-02-18 MED ORDER — PROPOFOL 500 MG/50ML IV EMUL
INTRAVENOUS | Status: DC | PRN
  Administered 2019-02-18: 125 ug/kg/min via INTRAVENOUS

## 2019-02-18 MED ORDER — ONDANSETRON HCL 4 MG/2ML IJ SOLN
INTRAMUSCULAR | Status: AC
  Filled 2019-02-18: qty 2

## 2019-02-18 MED ORDER — HYDROMORPHONE HCL 1 MG/ML IJ SOLN
INTRAMUSCULAR | Status: AC
  Filled 2019-02-18: qty 1

## 2019-02-18 MED ORDER — ONDANSETRON HCL 4 MG/2ML IJ SOLN: 4.0000 mg | Freq: Four times a day (QID) | INTRAMUSCULAR | Status: DC | PRN

## 2019-02-18 MED ORDER — PHENYLEPHRINE 40 MCG/ML (10ML) SYRINGE FOR IV PUSH (FOR BLOOD PRESSURE SUPPORT)
PREFILLED_SYRINGE | INTRAVENOUS | Status: DC | PRN
  Administered 2019-02-18: 160 ug via INTRAVENOUS

## 2019-02-18 MED ORDER — MORPHINE SULFATE (PF) 4 MG/ML IV SOLN: 1.0000 mg | INTRAVENOUS | Status: DC | PRN

## 2019-02-18 MED ORDER — METHOCARBAMOL 500 MG PO TABS
500.0000 mg | ORAL_TABLET | Freq: Four times a day (QID) | ORAL | Status: DC | PRN
  Administered 2019-02-19 – 2019-02-20 (×3): 500 mg via ORAL
  Filled 2019-02-18 (×3): qty 1

## 2019-02-18 MED ORDER — TRAMADOL HCL 50 MG PO TABS
50.0000 mg | ORAL_TABLET | Freq: Four times a day (QID) | ORAL | Status: DC | PRN
  Filled 2019-02-18: qty 1

## 2019-02-18 MED ORDER — ONDANSETRON HCL 4 MG/2ML IJ SOLN
INTRAMUSCULAR | Status: DC | PRN
  Administered 2019-02-18: 4 mg via INTRAVENOUS

## 2019-02-18 SURGICAL SUPPLY — 46 items
BAG DECANTER FOR FLEXI CONT (MISCELLANEOUS) IMPLANT
BAG ZIPLOCK 12X15 (MISCELLANEOUS) IMPLANT
BLADE SAG 18X100X1.27 (BLADE) ×2 IMPLANT
CLSR STERI-STRIP ANTIMIC 1/2X4 (GAUZE/BANDAGES/DRESSINGS) ×4 IMPLANT
COVER PERINEAL POST (MISCELLANEOUS) ×2 IMPLANT
COVER SURGICAL LIGHT HANDLE (MISCELLANEOUS) ×2 IMPLANT
COVER WAND RF STERILE (DRAPES) ×2 IMPLANT
CUP ACETBLR 48 OD SECTOR II (Hips) ×2 IMPLANT
DECANTER SPIKE VIAL GLASS SM (MISCELLANEOUS) ×2 IMPLANT
DRAPE STERI IOBAN 125X83 (DRAPES) ×2 IMPLANT
DRAPE U-SHAPE 47X51 STRL (DRAPES) ×4 IMPLANT
DRSG ADAPTIC 3X8 NADH LF (GAUZE/BANDAGES/DRESSINGS) ×2 IMPLANT
DRSG MEPILEX BORDER 4X4 (GAUZE/BANDAGES/DRESSINGS) ×2 IMPLANT
DRSG MEPILEX BORDER 4X8 (GAUZE/BANDAGES/DRESSINGS) ×2 IMPLANT
DURAPREP 26ML APPLICATOR (WOUND CARE) ×2 IMPLANT
ELECT REM PT RETURN 15FT ADLT (MISCELLANEOUS) ×2 IMPLANT
EVACUATOR 1/8 PVC DRAIN (DRAIN) ×2 IMPLANT
GLOVE BIO SURGEON STRL SZ 6 (GLOVE) ×2 IMPLANT
GLOVE BIO SURGEON STRL SZ7 (GLOVE) IMPLANT
GLOVE BIO SURGEON STRL SZ8 (GLOVE) ×2 IMPLANT
GLOVE BIOGEL PI IND STRL 6.5 (GLOVE) ×1 IMPLANT
GLOVE BIOGEL PI IND STRL 7.0 (GLOVE) IMPLANT
GLOVE BIOGEL PI IND STRL 8 (GLOVE) ×1 IMPLANT
GLOVE BIOGEL PI INDICATOR 6.5 (GLOVE) ×1
GLOVE BIOGEL PI INDICATOR 7.0 (GLOVE)
GLOVE BIOGEL PI INDICATOR 8 (GLOVE) ×1
GOWN STRL REUS W/TWL LRG LVL3 (GOWN DISPOSABLE) ×2 IMPLANT
GOWN STRL REUS W/TWL XL LVL3 (GOWN DISPOSABLE) IMPLANT
HEAD FEM STD 28X+1.5 STRL (Hips) ×2 IMPLANT
HOLDER FOLEY CATH W/STRAP (MISCELLANEOUS) ×2 IMPLANT
KIT TURNOVER KIT A (KITS) IMPLANT
LINER MARATHON 28 48 (Hips) ×2 IMPLANT
MANIFOLD NEPTUNE II (INSTRUMENTS) ×2 IMPLANT
PACK ANTERIOR HIP CUSTOM (KITS) ×2 IMPLANT
PENCIL SMOKE EVACUATOR (MISCELLANEOUS) ×2 IMPLANT
STEM FEMORAL SZ 5MM STD ACTIS (Stem) ×2 IMPLANT
STRIP CLOSURE SKIN 1/2X4 (GAUZE/BANDAGES/DRESSINGS) ×2 IMPLANT
SUT ETHIBOND NAB CT1 #1 30IN (SUTURE) ×2 IMPLANT
SUT MNCRL AB 4-0 PS2 18 (SUTURE) ×2 IMPLANT
SUT STRATAFIX 0 PDS 27 VIOLET (SUTURE) ×2
SUT VIC AB 2-0 CT1 27 (SUTURE) ×2
SUT VIC AB 2-0 CT1 TAPERPNT 27 (SUTURE) ×2 IMPLANT
SUTURE STRATFX 0 PDS 27 VIOLET (SUTURE) ×1 IMPLANT
SYR 50ML LL SCALE MARK (SYRINGE) IMPLANT
TRAY FOLEY MTR SLVR 16FR STAT (SET/KITS/TRAYS/PACK) ×2 IMPLANT
YANKAUER SUCT BULB TIP 10FT TU (MISCELLANEOUS) ×2 IMPLANT

## 2019-02-18 NOTE — Care Plan (Signed)
Ortho Bundle Case Management Note  Patient Details  Name: Amy Carroll MRN: [id number] Date of Birth: 1934/03/21  R THA on 02-18-19 DCP:  Home with spouse.  Resides at [place] in a 1 story home with 2 ste. DME:  RW ordered through [place].  Does not need a 3-in-1, has elevated toilets with grab bars. PT:  [facility].  Order and office note have been sent.                     DME Arranged:  Gilford Rile rolling DME Agency:  [employer]  HH Arranged:  PT HH Agency:  ([place])  Additional Comments: Please contact me with any questions of if this plan should need to change.  Marianne Sofia, RN,CCM [facility]  [phone] 02/18/2019, 8:49 AM

## 2019-02-18 NOTE — Transfer of Care (Signed)
Immediate Anesthesia Transfer of Care Note  Patient: Amy Carroll  Procedure(s) Performed: TOTAL HIP ARTHROPLASTY ANTERIOR APPROACH (Right Hip)  Patient Location: PACU  Anesthesia Type:MAC and Spinal  Level of Consciousness: awake, alert , oriented and patient cooperative  Airway & Oxygen Therapy: Patient Spontanous Breathing and Patient connected to face mask oxygen  Post-op Assessment: Report given to RN and Post -op Vital signs reviewed and stable  Post vital signs: Reviewed and stable  Last Vitals:  Vitals Value Taken Time  BP 131/76 02/18/19 1046  Temp    Pulse 81 02/18/19 1047  Resp 20 02/18/19 1047  SpO2 97 % 02/18/19 1047  Vitals shown include unvalidated device data.  Last Pain:  Vitals:   02/18/19 0739  TempSrc:   PainSc: 0-No pain         Complications: No apparent anesthesia complications

## 2019-02-18 NOTE — Anesthesia Procedure Notes (Signed)
Spinal  Patient location during procedure: OR Start time: 02/18/2019 8:57 AM End time: 02/18/2019 9:01 AM Staffing Performed: anesthesiologist  Anesthesiologist: Lyn Hollingshead, MD Preanesthetic Checklist Completed: patient identified, IV checked, site marked, risks and benefits discussed, surgical consent, monitors and equipment checked, pre-op evaluation and timeout performed Spinal Block Patient position: sitting Prep: DuraPrep and site prepped and draped Patient monitoring: continuous pulse ox and blood pressure Approach: right paramedian Location: L3-4 Injection technique: single-shot Needle Needle type: Pencan  Needle gauge: 24 G Needle length: 10 cm Needle insertion depth: 6 cm Assessment Sensory level: T8

## 2019-02-18 NOTE — Discharge Instructions (Signed)
Dr. Gaynelle Arabian Total Joint Specialist [facility] [address] [place], [place] [address] [phone]  ANTERIOR APPROACH TOTAL HIP REPLACEMENT POSTOPERATIVE DIRECTIONS   Hip Rehabilitation, Guidelines Following Surgery  The results of a hip operation are greatly improved after range of motion and muscle strengthening exercises. Follow all safety measures which are given to protect your hip. If any of these exercises cause increased pain or swelling in your joint, decrease the amount until you are comfortable again. Then slowly increase the exercises. Call your caregiver if you have problems or questions.   HOME CARE INSTRUCTIONS  . Remove items at home which could result in a fall. This includes throw rugs or furniture in walking pathways.   ICE to the affected hip every three hours for 30 minutes at a time and then as needed for pain and swelling.  Continue to use ice on the hip for pain and swelling from surgery. You may notice swelling that will progress down to the foot and ankle.  This is normal after surgery.  Elevate the leg when you are not up walking on it.    Continue to use the breathing machine which will help keep your temperature down.  It is common for your temperature to cycle up and down following surgery, especially at night when you are not up moving around and exerting yourself.  The breathing machine keeps your lungs expanded and your temperature down.  DIET You may resume your previous home diet once your are discharged from the hospital.  DRESSING / WOUND CARE / SHOWERING You may change your dressing 3-5 days after surgery.  Then change the dressing every day with sterile gauze.  Please use good hand washing techniques before changing the dressing.  Do not use any lotions or creams on the incision until instructed by your surgeon. You may start showering once you are discharged home but do not submerge the incision under water. Just pat the  incision dry and apply a dry gauze dressing on daily. Change the surgical dressing daily and reapply a dry dressing each time.  ACTIVITY Walk with your walker as instructed. Use walker as long as suggested by your caregivers. Avoid periods of inactivity such as sitting longer than an hour when not asleep. This helps prevent blood clots.  You may resume a sexual relationship in one month or when given the OK by your doctor.  You may return to work once you are cleared by your doctor.  Do not drive a car for 6 weeks or until released by you surgeon.  Do not drive while taking narcotics.  WEIGHT BEARING Weight bearing as tolerated with assist device (walker, cane, etc) as directed, use it as long as suggested by your surgeon or therapist, typically at least 4-6 weeks.  POSTOPERATIVE CONSTIPATION PROTOCOL Constipation - defined medically as fewer than three stools per week and severe constipation as less than one stool per week.  One of the most common issues patients have following surgery is constipation.  Even if you have a regular bowel pattern at home, your normal regimen is likely to be disrupted due to multiple reasons following surgery.  Combination of anesthesia, postoperative narcotics, change in appetite and fluid intake all can affect your bowels.  In order to avoid complications following surgery, here are some recommendations in order to help you during your recovery period.  Colace (docusate) - Pick up an over-the-counter form of Colace or another stool softener and take twice a day  as long as you are requiring postoperative pain medications.  Take with a full glass of water daily.  If you experience loose stools or diarrhea, hold the colace until you stool forms back up.  If your symptoms do not get better within 1 week or if they get worse, check with your doctor.  Dulcolax (bisacodyl) - Pick up over-the-counter and take as directed by the product packaging as needed to assist with  the movement of your bowels.  Take with a full glass of water.  Use this product as needed if not relieved by Colace only.   MiraLax (polyethylene glycol) - Pick up over-the-counter to have on hand.  MiraLax is a solution that will increase the amount of water in your bowels to assist with bowel movements.  Take as directed and can mix with a glass of water, juice, soda, coffee, or tea.  Take if you go more than two days without a movement. Do not use MiraLax more than once per day. Call your doctor if you are still constipated or irregular after using this medication for 7 days in a row.  If you continue to have problems with postoperative constipation, please contact the office for further assistance and recommendations.  If you experience "the worst abdominal pain ever" or develop nausea or vomiting, please contact the office immediatly for further recommendations for treatment.  ITCHING  If you experience itching with your medications, try taking only a single pain pill, or even half a pain pill at a time.  You can also use Benadryl over the counter for itching or also to help with sleep.   TED HOSE STOCKINGS Wear the elastic stockings on both legs for three weeks following surgery during the day but you may remove then at night for sleeping.  MEDICATIONS See your medication summary on the "After Visit Summary" that the nursing staff will review with you prior to discharge.  You may have some home medications which will be placed on hold until you complete the course of blood thinner medication.  It is important for you to complete the blood thinner medication as prescribed by your surgeon.  Continue your approved medications as instructed at time of discharge.  PRECAUTIONS If you experience chest pain or shortness of breath - call 911 immediately for transfer to the hospital emergency department.  If you develop a fever greater that 101 F, purulent drainage from wound, increased redness or  drainage from wound, foul odor from the wound/dressing, or calf pain - CONTACT YOUR SURGEON.                                                   FOLLOW-UP APPOINTMENTS Make sure you keep all of your appointments after your operation with your surgeon and caregivers. You should call the office at the above phone number and make an appointment for approximately two weeks after the date of your surgery or on the date instructed by your surgeon outlined in the "After Visit Summary".  RANGE OF MOTION AND STRENGTHENING EXERCISES  These exercises are designed to help you keep full movement of your hip joint. Follow your caregiver's or physical therapist's instructions. Perform all exercises about fifteen times, three times per day or as directed. Exercise both hips, even if you have had only one joint replacement. These exercises can be done on  a training (exercise) mat, on the floor, on a table or on a bed. Use whatever works the best and is most comfortable for you. Use music or television while you are exercising so that the exercises are a pleasant break in your day. This will make your life better with the exercises acting as a break in routine you can look forward to.  . Lying on your back, slowly slide your foot toward your buttocks, raising your knee up off the floor. Then slowly slide your foot back down until your leg is straight again.  . Lying on your back spread your legs as far apart as you can without causing discomfort.  . Lying on your side, raise your upper leg and foot straight up from the floor as far as is comfortable. Slowly lower the leg and repeat.  . Lying on your back, tighten up the muscle in the front of your thigh (quadriceps muscles). You can do this by keeping your leg straight and trying to raise your heel off the floor. This helps strengthen the largest muscle supporting your knee.  . Lying on your back, tighten up the muscles of your buttocks both with the legs straight and with  the knee bent at a comfortable angle while keeping your heel on the floor.   IF YOU ARE TRANSFERRED TO A SKILLED REHAB FACILITY If the patient is transferred to a skilled rehab facility following release from the hospital, a list of the current medications will be sent to the facility for the patient to continue.  When discharged from the skilled rehab facility, please have the facility set up the patient's [place] prior to being released. Also, the skilled facility will be responsible for providing the patient with their medications at time of release from the facility to include their pain medication, the muscle relaxants, and their blood thinner medication. If the patient is still at the rehab facility at time of the two week follow up appointment, the skilled rehab facility will also need to assist the patient in arranging follow up appointment in our office and any transportation needs.  MAKE SURE YOU:  . Understand these instructions.  . Get help right away if you are not doing well or get worse.    Pick up stool softner and laxative for home use following surgery while on pain medications. Do not submerge incision under water. Please use good hand washing techniques while changing dressing each day. May shower starting three days after surgery. Please use a clean towel to pat the incision dry following showers. Continue to use ice for pain and swelling after surgery. Do not use any lotions or creams on the incision until instructed by your surgeon.

## 2019-02-18 NOTE — Anesthesia Preprocedure Evaluation (Signed)
Anesthesia Evaluation  Patient identified by MRN, date of birth, ID band Patient awake    Reviewed: Allergy & Precautions, NPO status , Patient's Chart, lab work & pertinent test results  Airway Mallampati: II  TM Distance: >3 FB Neck ROM: Full    Dental no notable dental hx. (+) Teeth Intact   Pulmonary former smoker,    Pulmonary exam normal breath sounds clear to auscultation       Cardiovascular hypertension, Pt. on medications Normal cardiovascular exam Rhythm:Regular Rate:Normal     Neuro/Psych negative neurological ROS  negative psych ROS   GI/Hepatic negative GI ROS, Neg liver ROS,   Endo/Other  negative endocrine ROS  Renal/GU negative Renal ROS  negative genitourinary   Musculoskeletal  (+) Arthritis , Osteoarthritis,    Abdominal Normal abdominal exam  (+)   Peds negative pediatric ROS (+)  Hematology negative hematology ROS (+)   Anesthesia Other Findings   Reproductive/Obstetrics                             Anesthesia Physical  Anesthesia Plan  ASA: II  Anesthesia Plan: Spinal   Post-op Pain Management:    Induction:   PONV Risk Score and Plan: 2 and Ondansetron and Dexamethasone  Airway Management Planned: Simple Face Mask, Nasal Cannula and Natural Airway  Additional Equipment: None  Intra-op Plan:   Post-operative Plan:   Informed Consent: I have reviewed the patients History and Physical, chart, labs and discussed the procedure including the risks, benefits and alternatives for the proposed anesthesia with the patient or authorized representative who has indicated his/her understanding and acceptance.       Plan Discussed with: CRNA  Anesthesia Plan Comments:         Anesthesia Quick Evaluation

## 2019-02-18 NOTE — Interval H&P Note (Signed)
History and Physical Interval Note:  02/18/2019 7:05 AM  Amy Carroll  has presented today for surgery, with the diagnosis of right hip osteoarthritis.  The various methods of treatment have been discussed with the patient and family. After consideration of risks, benefits and other options for treatment, the patient has consented to  Procedure(s) with comments: [place] (Right) - 158min as a surgical intervention.  The patient's history has been reviewed, patient examined, no change in status, stable for surgery.  I have reviewed the patient's chart and labs.  Questions were answered to the patient's satisfaction.     [name]

## 2019-02-18 NOTE — Anesthesia Procedure Notes (Signed)
Procedure Name: MAC Date/Time: 02/18/2019 8:45 AM Performed by: Niel Hummer, CRNA Pre-anesthesia Checklist: Patient identified, Emergency Drugs available, Suction available and Patient being monitored Patient Re-evaluated:Patient Re-evaluated prior to induction Oxygen Delivery Method: Simple face mask

## 2019-02-18 NOTE — Evaluation (Signed)
Physical Therapy Evaluation Patient Details Name: Amy Carroll MRN: [id number] DOB: December 25, 1934 Today's Date: 02/18/2019   History of Present Illness  Patient is 84 y.o. female s/p Rt THA anterior approach on 02/18/19 with PMH significant for HTNN, and OA.  Clinical Impression  Amy Carroll is a 84 y.o. female POD 0 s/p Rt THA anterior approach. Patient reports independence with use of SPC for mobility at baseline. Patient is now limited by functional impairments (see PT problem list below) and requires min assist for transfers and gait with RW. Patient was able to ambulate ~50 feet with RW and min assist with cues for safe walker positioning. Patient instructed in exercise to facilitate ROM and circulation. Patient will benefit from continued skilled PT interventions to address impairments and progress towards PLOF. Acute PT will follow to progress mobility and stair training in preparation for safe discharge home.    Follow Up Recommendations Follow surgeon's recommendation for DC plan and follow-up therapies    Equipment Recommendations  Rolling walker with 5" wheels    Recommendations for Other Services       Precautions / Restrictions Precautions Precautions: Fall Restrictions Weight Bearing Restrictions: No      Mobility  Bed Mobility Overal bed mobility: Needs Assistance Bed Mobility: Supine to Sit     Supine to sit: HOB elevated;Min assist     General bed mobility comments: cues for sequencing with use of bed rail and assist to walk feet to EOB, pt fearful of moving Rt hip initially. assist required to raise trunk upright.  Transfers Overall transfer level: Needs assistance Equipment used: Rolling walker (2 wheeled) Transfers: Sit to/from Stand Sit to Stand: Min assist         General transfer comment: cues for hand placement and technique with RW, assist required to initiate power up and rise.  Ambulation/Gait Ambulation/Gait assistance: Min  assist Gait Distance (Feet): 50 Feet Assistive device: Rolling walker (2 wheeled) Gait Pattern/deviations: Step-through pattern;Decreased stride length;Decreased step length - left;Decreased stance time - right;Narrow base of support;Antalgic;Decreased step length - right Gait velocity: slow   General Gait Details: verbal cues for hand placement and assist for RW positioning, pt improved walker management as she continued. pt with no overt LOB, pt slow with short step length bil.  Stairs            Wheelchair Mobility    Modified Rankin (Stroke Patients Only)       Balance Overall balance assessment: Needs assistance Sitting-balance support: Feet supported Sitting balance-Leahy Scale: Good     Standing balance support: During functional activity;Bilateral upper extremity supported Standing balance-Leahy Scale: Fair              Pertinent Vitals/Pain Pain Assessment: Faces Faces Pain Scale: Hurts even more Pain Location: Rt hip Pain Descriptors / Indicators: Aching;Sore Pain Intervention(s): Limited activity within patient's tolerance;Monitored during session;Repositioned;Ice applied    Home Living Family/patient expects to be discharged to:: Private residence Living Arrangements: Other (Comment);Spouse/significant other(pt lives at [place]) Available Help at Discharge: Family;Other (Comment)(pt has assistance available if needed via staff at [facility]) Type of Home: House Home Access: Level entry     Home Layout: One [place]: [place] - 2 wheels;Cane - quad;Other (comment);Shower seat - built in;Grab bars - tub/shower Additional Comments: pt reports she has two built in shower seats but is unsure how well they will work for her because they are low    Prior Function Level of Independence: Independent with assistive device(s)  Comments: pt has been using cane for mobiltiy     Hand Dominance   Dominant Hand: Right     Extremity/Trunk Assessment   Upper Extremity Assessment Upper Extremity Assessment: Overall WFL for tasks assessed    Lower Extremity Assessment Lower Extremity Assessment: Generalized weakness;RLE deficits/detail RLE Deficits / Details: pt with good quad activaiton and 3+/5 for MMT with quad, limited by pain RLE Sensation: WNL RLE Coordination: WNL    Cervical / Trunk Assessment Cervical / Trunk Assessment: Normal  Communication   Communication: No difficulties  Cognition Arousal/Alertness: Awake/alert Behavior During Therapy: WFL for tasks assessed/performed Overall Cognitive Status: Within Functional Limits for tasks assessed             General Comments      Exercises Total Joint Exercises Ankle Circles/Pumps: AROM;10 reps;Seated;Both Quad Sets: AROM;5 reps;Seated;Right Heel Slides: AROM;5 reps;Seated;Right   Assessment/Plan    PT Assessment Patient needs continued PT services  PT Problem List Decreased strength;Decreased range of motion;Decreased balance;Decreased activity tolerance;Decreased mobility;Decreased knowledge of use of DME       PT Treatment Interventions      PT Goals (Current goals can be found in the Care Plan section)  Acute Rehab PT Goals Patient Stated Goal: to return home and regain independence PT Goal Formulation: With patient Time For Goal Achievement: 02/25/19 Potential to Achieve Goals: Good    Frequency 7X/week    AM-PAC PT "6 Clicks" Mobility  Outcome Measure Help needed turning from your back to your side while in a flat bed without using bedrails?: A Little Help needed moving from lying on your back to sitting on the side of a flat bed without using bedrails?: A Little Help needed moving to and from a bed to a chair (including a wheelchair)?: A Little Help needed standing up from a chair using your arms (e.g., wheelchair or bedside chair)?: A Little Help needed to walk in hospital room?: A Little Help needed climbing 3-5  steps with a railing? : A Little 6 Click Score: 18    End of Session Equipment Utilized During Treatment: Gait belt Activity Tolerance: Patient tolerated treatment well Patient left: with call bell/phone within reach;in chair;with chair alarm set Nurse Communication: Mobility status PT Visit Diagnosis: Muscle weakness (generalized) (M62.81);Difficulty in walking, not elsewhere classified (R26.2)    Time: [id number] PT Time Calculation (min) (ACUTE ONLY): 35 min   Charges:   PT Evaluation $PT Eval Low Complexity: 1 Low PT Treatments $Gait Training: 8-22 mins        Gwynneth Albright PT, DPT Physical Therapist with [facility]  02/18/2019 5:43 PM

## 2019-02-18 NOTE — Op Note (Signed)
OPERATIVE REPORT- TOTAL HIP ARTHROPLASTY   PREOPERATIVE DIAGNOSIS: Osteoarthritis of the Right hip.   POSTOPERATIVE DIAGNOSIS: Osteoarthritis of the Right  hip.   PROCEDURE: Right total hip arthroplasty, anterior approach.   SURGEON: Gaynelle Arabian, MD   ASSISTANT: Griffith Citron, PA-C  ANESTHESIA:  Spinal  ESTIMATED BLOOD LOSS:-450 mL    DRAINS: Hemovac x1.   COMPLICATIONS: None   CONDITION: PACU - hemodynamically stable.   BRIEF CLINICAL NOTE: Amy Carroll is a 84 y.o. female who has advanced end-  stage arthritis of their Right  hip with progressively worsening pain and  dysfunction.The patient has failed nonoperative management and presents for  total hip arthroplasty.   PROCEDURE IN DETAIL: After successful administration of spinal  anesthetic, the traction boots for the [facility] bed were placed on both  feet and the patient was placed onto the [facility] bed, boots placed into the leg  holders. The Right hip was then isolated from the perineum with plastic  drapes and prepped and draped in the usual sterile fashion. ASIS and  greater trochanter were marked and a oblique incision was made, starting  at about 1 cm lateral and 2 cm distal to the ASIS and coursing towards  the anterior cortex of the femur. The skin was cut with a 10 blade  through subcutaneous tissue to the level of the fascia overlying the  tensor fascia lata muscle. The fascia was then incised in line with the  incision at the junction of the anterior third and posterior 2/3rd. The  muscle was teased off the fascia and then the interval between the TFL  and the rectus was developed. The Hohmann retractor was then placed at  the top of the femoral neck over the capsule. The vessels overlying the  capsule were cauterized and the fat on top of the capsule was removed.  A Hohmann retractor was then placed anterior underneath the rectus  femoris to give exposure to the entire anterior capsule. A T-shaped   capsulotomy was performed. The edges were tagged and the femoral head  was identified.       Osteophytes are removed off the superior acetabulum.  The femoral neck was then cut in situ with an oscillating saw. Traction  was then applied to the left lower extremity utilizing the [facility]  traction. The femoral head was then removed. Retractors were placed  around the acetabulum and then circumferential removal of the labrum was  performed. Osteophytes were also removed. Reaming starts at 45 mm to  medialize and  Increased in 2 mm increments to 47 mm. We reamed in  approximately 40 degrees of abduction, 20 degrees anteversion. A 48 mm  pinnacle acetabular shell was then impacted in anatomic position under  fluoroscopic guidance with excellent purchase. We did not need to place  any additional dome screws. A 28 mm neutral + 4 marathon liner was then  placed into the acetabular shell.       The femoral lift was then placed along the lateral aspect of the femur  just distal to the vastus ridge. The leg was  externally rotated and capsule  was stripped off the inferior aspect of the femoral neck down to the  level of the lesser trochanter, this was done with electrocautery. The femur was lifted after this was performed. The  leg was then placed in an extended and adducted position essentially delivering the femur. We also removed the capsule superiorly and the piriformis from the piriformis  fossa to gain excellent exposure of the  proximal femur. Rongeur was used to remove some cancellous bone to get  into the lateral portion of the proximal femur for placement of the  initial starter reamer. The starter broaches was placed  the starter broach  and was shown to go down the center of the canal. Broaching  with the Actis system was then performed starting at size 0  coursing  Up to size 5. A size 5 had excellent torsional and rotational  and axial stability. The trial standard offset neck was then  placed  with a 28 + 1.5 trial head. The hip was then reduced. We confirmed that  the stem was in the canal both on AP and lateral x-rays. It also has excellent sizing. The hip was reduced with outstanding stability through full extension and full external rotation.. AP pelvis was taken and the leg lengths were measured and found to be equal. Hip was then dislocated again and the femoral head and neck removed. The  femoral broach was removed. Size 5 Actis stem with a standard offset  neck was then impacted into the femur following native anteversion. Has  excellent purchase in the canal. Excellent torsional and rotational and  axial stability. It is confirmed to be in the canal on AP and lateral  fluoroscopic views. The 28 + 1.5 metal head was placed and the hip  reduced with outstanding stability. Again AP pelvis was taken and it  confirmed that the leg lengths were equal. The wound was then copiously  irrigated with saline solution and the capsule reattached and repaired  with Ethibond suture. 30 ml of .25% Bupivicaine was  injected into the capsule and into the edge of the tensor fascia lata as well as subcutaneous tissue. The fascia overlying the tensor fascia lata was then closed with a running #1 V-Loc. Subcu was closed with interrupted 2-0 Vicryl and subcuticular running 4-0 Monocryl. Incision was cleaned  and dried. Steri-Strips and a bulky sterile dressing applied. Hemovac  drain was hooked to suction and then the patient was awakened and transported to  recovery in stable condition.        Please note that a surgical assistant was a medical necessity for this procedure to perform it in a safe and expeditious manner. Assistant was necessary to provide appropriate retraction of vital neurovascular structures and to prevent femoral fracture and allow for anatomic placement of the prosthesis.  Gaynelle Arabian, M.D.

## 2019-02-19 DIAGNOSIS — Z8249 Family history of ischemic heart disease and other diseases of the circulatory system: Secondary | ICD-10-CM | POA: Diagnosis not present

## 2019-02-19 DIAGNOSIS — M1611 Unilateral primary osteoarthritis, right hip: Secondary | ICD-10-CM | POA: Diagnosis not present

## 2019-02-19 DIAGNOSIS — E669 Obesity, unspecified: Secondary | ICD-10-CM | POA: Diagnosis not present

## 2019-02-19 DIAGNOSIS — Z20828 Contact with and (suspected) exposure to other viral communicable diseases: Secondary | ICD-10-CM | POA: Diagnosis not present

## 2019-02-19 DIAGNOSIS — Z809 Family history of malignant neoplasm, unspecified: Secondary | ICD-10-CM | POA: Diagnosis not present

## 2019-02-19 DIAGNOSIS — Z96651 Presence of right artificial knee joint: Secondary | ICD-10-CM | POA: Diagnosis not present

## 2019-02-19 DIAGNOSIS — Z825 Family history of asthma and other chronic lower respiratory diseases: Secondary | ICD-10-CM | POA: Diagnosis not present

## 2019-02-19 DIAGNOSIS — Z6824 Body mass index (BMI) 24.0-24.9, adult: Secondary | ICD-10-CM | POA: Diagnosis not present

## 2019-02-19 DIAGNOSIS — I10 Essential (primary) hypertension: Secondary | ICD-10-CM | POA: Diagnosis not present

## 2019-02-19 DIAGNOSIS — Z87891 Personal history of nicotine dependence: Secondary | ICD-10-CM | POA: Diagnosis not present

## 2019-02-19 LAB — BASIC METABOLIC PANEL
Anion gap: 8 (ref 5–15)
BUN: 22 mg/dL (ref 8–23)
CO2: 24 mmol/L (ref 22–32)
Calcium: 8.6 mg/dL — ABNORMAL LOW (ref 8.9–10.3)
Chloride: 105 mmol/L (ref 98–111)
Creatinine, Ser: 0.61 mg/dL (ref 0.44–1.00)
GFR calc Af Amer: >60 mL/min (ref >60)
GFR calc non Af Amer: >60 mL/min (ref >60)
Glucose, Bld: 126 mg/dL — ABNORMAL HIGH (ref 70–99)
Potassium: 3.6 mmol/L (ref 3.5–5.1)
Sodium: 137 mmol/L (ref 135–145)

## 2019-02-19 LAB — CBC
HCT: 30.8 % — ABNORMAL LOW (ref 36.0–46.0)
Hemoglobin: 10 g/dL — ABNORMAL LOW (ref 12.0–15.0)
MCH: 32.8 pg (ref 26.0–34.0)
MCHC: 32.5 g/dL (ref 30.0–36.0)
MCV: 101 fL — ABNORMAL HIGH (ref 80.0–100.0)
Platelets: 204 10*3/uL (ref 150–400)
RBC: 3.05 MIL/uL — ABNORMAL LOW (ref 3.87–5.11)
RDW: 12.2 % (ref 11.5–15.5)
WBC: 10.1 10*3/uL (ref 4.0–10.5)
nRBC: 0 % (ref 0.0–0.2)

## 2019-02-19 MED ORDER — ASPIRIN 325 MG PO TBEC: 325.0000 mg | DELAYED_RELEASE_TABLET | Freq: Two times a day (BID) | ORAL | 0 refills | Status: AC

## 2019-02-19 MED ORDER — HYDROCODONE-ACETAMINOPHEN 5-325 MG PO TABS: 1.0000 | ORAL_TABLET | Freq: Four times a day (QID) | ORAL | 0 refills | Status: DC | PRN

## 2019-02-19 MED ORDER — TRAMADOL HCL 50 MG PO TABS: 50.0000 mg | ORAL_TABLET | Freq: Four times a day (QID) | ORAL | 0 refills | Status: DC | PRN

## 2019-02-19 MED ORDER — METHOCARBAMOL 500 MG PO TABS: 500.0000 mg | ORAL_TABLET | Freq: Four times a day (QID) | ORAL | 0 refills | Status: DC | PRN

## 2019-02-19 NOTE — Progress Notes (Signed)
   Subjective: 1 Day Post-Op Procedure(s) (LRB): TOTAL HIP ARTHROPLASTY ANTERIOR APPROACH (Right) Patient reports pain as moderate.   Patient seen in rounds for Dr. Wynelle Link. Patient is well, and has had no acute complaints or problems other than pain in the right hip. No issues overnight. Denies chest pain or SOB. Foley catheter to be removed this AM. We will continue therapy today.   Objective: Vital signs in last 24 hours: Temp:  [97.4 F (36.3 C)-98.1 F (36.7 C)] 98.1 F (36.7 C) (12/24 0544) Pulse Rate:  [73-103] 92 (12/24 0544) Resp:  [13-20] 18 (12/24 0544) BP: (111-143)/(59-83) 123/61 (12/24 0544) SpO2:  [98 %-100 %] 98 % (12/24 0544)  Intake/Output from previous day:  Intake/Output Summary (Last 24 hours) at 02/19/2019 0756 Last data filed at 02/19/2019 0727 Gross per 24 hour  Intake 3148.75 ml  Output 2530 ml  Net 618.75 ml     Intake/Output this shift: Total I/O In: 578.8 [P.O.:100; I.V.:478.8] Out: 0   Labs: Recent Labs    02/16/19 1405 02/19/19 0418  HGB 11.7* 10.0*   Recent Labs    02/16/19 1405 02/19/19 0418  WBC 5.0 10.1  RBC 3.56* 3.05*  HCT 36.5 30.8*  PLT 256 204   Recent Labs    02/16/19 1405 02/19/19 0418  NA 136 137  K 4.0 3.6  CL 102 105  CO2 24 24  BUN 38* 22  CREATININE 0.98 0.61  GLUCOSE 101* 126*  CALCIUM 8.8* 8.6*   Recent Labs    02/16/19 1405  INR 0.9    Exam: General - Patient is Alert and Oriented Extremity - Neurologically intact Neurovascular intact Sensation intact distally Dorsiflexion/Plantar flexion intact Dressing - dressing C/D/I Motor Function - intact, moving foot and toes well on exam.   Past Medical History:  Diagnosis Date  . Arthritis   . Hypertension     Assessment/Plan: 1 Day Post-Op Procedure(s) (LRB): TOTAL HIP ARTHROPLASTY ANTERIOR APPROACH (Right) Principal Problem:   OA (osteoarthritis) of hip  Estimated body mass index is 24.45 kg/m as calculated from the following:  Height as of this encounter: 5' 5.5" (1.664 m).   Weight as of this encounter: 67.7 kg. Advance diet Up with therapy D/C IV fluids  DVT Prophylaxis - Aspirin Weight bearing as tolerated. D/C O2 and pulse ox and try on room air. Hemovac pulled without difficulty, will continue therapy.  Plan is to go Home after hospital stay. Plan for discharge later today with HEP if progresses with therapy and is meeting her goals. Follow-up in the office in 2 weeks.   Theresa Duty, PA-C Orthopedic Surgery 02/19/2019, 7:56 AM

## 2019-02-19 NOTE — Care Management CC44 (Signed)
Condition Code 44 Documentation Completed  Patient Details  Name: Amy Carroll MRN: [id number] Date of Birth: 12-04-1934   Condition Code 44 given:  Yes Patient signature on Condition Code 44 notice:  Yes Documentation of 2 MD's agreement:  Yes Code 44 added to claim:  Yes    Trish Mage, LCSW 02/19/2019, 9:29 AM

## 2019-02-19 NOTE — Progress Notes (Signed)
Physical Therapy Treatment Patient Details Name: Amy Carroll MRN: [id number] DOB: 07-Mar-1934 Today's Date: 02/19/2019    History of Present Illness Patient is 84 y.o. female s/p Rt THA anterior approach on 02/18/19 with PMH significant for HTNN, and OA.    PT Comments    Pt with marked improvement in activity tolerance.   Follow Up Recommendations  Follow surgeon's recommendation for DC plan and follow-up therapies;Home health PT     Equipment Recommendations  Rolling walker with 5" wheels    Recommendations for Other Services       Precautions / Restrictions Precautions Precautions: Fall Restrictions Weight Bearing Restrictions: No    Mobility  Bed Mobility               General bed mobility comments: Pt up in chair and left in bathroom  Transfers Overall transfer level: Needs assistance Equipment used: Rolling walker (2 wheeled) Transfers: Sit to/from Stand Sit to Stand: Min assist         General transfer comment: cues for hand placement and technique with RW  Ambulation/Gait Ambulation/Gait assistance: Min assist;Min guard Gait Distance (Feet): 100 Feet Assistive device: Rolling walker (2 wheeled) Gait Pattern/deviations: Decreased stride length;Decreased step length - left;Decreased stance time - right;Narrow base of support;Antalgic;Decreased step length - right;Step-to pattern Gait velocity: slow   General Gait Details: cues for posture and position from RW; noted improvement in stability and speed   Stairs             Wheelchair Mobility    Modified Rankin (Stroke Patients Only)       Balance Overall balance assessment: Needs assistance Sitting-balance support: Feet supported Sitting balance-Leahy Scale: Good     Standing balance support: During functional activity;Bilateral upper extremity supported Standing balance-Leahy Scale: Fair                              Cognition Arousal/Alertness:  Awake/alert Behavior During Therapy: WFL for tasks assessed/performed Overall Cognitive Status: Within Functional Limits for tasks assessed                                        Exercises      General Comments        Pertinent Vitals/Pain Pain Assessment: 0-10 Pain Score: 4  Pain Location: Rt hip Pain Descriptors / Indicators: Aching;Sore Pain Intervention(s): Limited activity within patient's tolerance;Monitored during session;Premedicated before session;Ice applied    Home Living                      Prior Function            PT Goals (current goals can now be found in the care plan section) Acute Rehab PT Goals Patient Stated Goal: to return home and regain independence PT Goal Formulation: With patient Time For Goal Achievement: 02/25/19 Potential to Achieve Goals: Good Progress towards PT goals: Progressing toward goals    Frequency    7X/week      PT Plan Current plan remains appropriate    Co-evaluation              AM-PAC PT "6 Clicks" Mobility   Outcome Measure  Help needed turning from your back to your side while in a flat bed without using bedrails?: A Little Help needed moving from lying on your back to  sitting on the side of a flat bed without using bedrails?: A Little Help needed moving to and from a bed to a chair (including a wheelchair)?: A Little Help needed standing up from a chair using your arms (e.g., wheelchair or bedside chair)?: A Little Help needed to walk in hospital room?: A Little Help needed climbing 3-5 steps with a railing? : A Little 6 Click Score: 18    End of Session Equipment Utilized During Treatment: Gait belt Activity Tolerance: Patient tolerated treatment well Patient left: Other (comment)(bathroom) Nurse Communication: Mobility status PT Visit Diagnosis: Muscle weakness (generalized) (M62.81);Difficulty in walking, not elsewhere classified (R26.2)     Time: 1478-2956 PT Time  Calculation (min) (ACUTE ONLY): 25 min  Charges:  $Gait Training: 23-37 mins                     Mauro Kaufmann PT Acute Rehabilitation Services Pager [phone] Office [phone]    Treysean Petruzzi 02/19/2019, 5:16 PM

## 2019-02-19 NOTE — Progress Notes (Signed)
MD paged regarding patients hemovac that was pulled out of the site while transporting her to bed.

## 2019-02-19 NOTE — Progress Notes (Signed)
Physical Therapy Treatment Patient Details Name: Amy Carroll MRN: [id number] DOB: 12-20-1934 Today's Date: 02/19/2019    History of Present Illness Patient is 84 y.o. female s/p Rt THA anterior approach on 02/18/19 with PMH significant for HTNN, and OA.    PT Comments    Pt agreeable to therex program but "I just don't feel up to getting up and around right now"   Follow Up Recommendations  Follow surgeon's recommendation for DC plan and follow-up therapies     Equipment Recommendations  Rolling walker with 5" wheels    Recommendations for Other Services       Precautions / Restrictions Precautions Precautions: Fall Restrictions Weight Bearing Restrictions: No    Mobility  Bed Mobility               General bed mobility comments: NT - pt up in recliner  Transfers                    Ambulation/Gait                 Stairs             Wheelchair Mobility    Modified Rankin (Stroke Patients Only)       Balance                                            Cognition Arousal/Alertness: Awake/alert Behavior During Therapy: WFL for tasks assessed/performed Overall Cognitive Status: Within Functional Limits for tasks assessed                                        Exercises Total Joint Exercises Ankle Circles/Pumps: AROM;Seated;Both;15 reps Quad Sets: AROM;Right;10 reps;Supine Heel Slides: AROM;Right;20 reps;Supine Hip ABduction/ADduction: AAROM;Right;15 reps;Supine    General Comments        Pertinent Vitals/Pain Pain Assessment: 0-10 Pain Score: 5  Pain Location: Rt hip Pain Descriptors / Indicators: Aching;Sore Pain Intervention(s): Limited activity within patient's tolerance;Monitored during session;Premedicated before session    Home Living                      Prior Function            PT Goals (current goals can now be found in the care plan section) Acute Rehab  PT Goals Patient Stated Goal: to return home and regain independence PT Goal Formulation: With patient Time For Goal Achievement: 02/25/19 Potential to Achieve Goals: Good Progress towards PT goals: Progressing toward goals    Frequency    7X/week      PT Plan Current plan remains appropriate    Co-evaluation              AM-PAC PT "6 Clicks" Mobility   Outcome Measure  Help needed turning from your back to your side while in a flat bed without using bedrails?: A Little Help needed moving from lying on your back to sitting on the side of a flat bed without using bedrails?: A Little Help needed moving to and from a bed to a chair (including a wheelchair)?: A Little Help needed standing up from a chair using your arms (e.g., wheelchair or bedside chair)?: A Little Help needed to walk in hospital room?: A Little Help needed climbing  3-5 steps with a railing? : A Little 6 Click Score: 18    End of Session   Activity Tolerance: Patient tolerated treatment well Patient left: with call bell/phone within reach;in chair;with chair alarm set Nurse Communication: Mobility status PT Visit Diagnosis: Muscle weakness (generalized) (M62.81);Difficulty in walking, not elsewhere classified (R26.2)     Time: 2952-8413 PT Time Calculation (min) (ACUTE ONLY): 24 min  Charges:  $Therapeutic Exercise: 23-37 mins                     Mauro Kaufmann PT Acute Rehabilitation Services Pager [phone] Office [phone]    Jennae Hakeem 02/19/2019, 9:31 AM

## 2019-02-19 NOTE — Anesthesia Postprocedure Evaluation (Signed)
Anesthesia Post Note  Patient: Amy Carroll  Procedure(s) Performed: TOTAL HIP ARTHROPLASTY ANTERIOR APPROACH (Right Hip)     Patient location during evaluation: PACU Anesthesia Type: Spinal Level of consciousness: awake Pain management: pain level controlled Vital Signs Assessment: post-procedure vital signs reviewed and stable Respiratory status: spontaneous breathing Cardiovascular status: stable Postop Assessment: no headache, no backache, spinal receding, no apparent nausea or vomiting and patient able to bend at knees Anesthetic complications: no    Last Vitals:  Vitals:   02/19/19 0858 02/19/19 1300  BP: (!) 122/56 108/62  Pulse: 96 92  Resp: 18 16  Temp:  36.7 C  SpO2: 98% 98%    Last Pain:  Vitals:   02/19/19 1407  TempSrc:   PainSc: 2    Pain Goal: Patients Stated Pain Goal: 3 (02/19/19 0906)                 Huston Foley

## 2019-02-19 NOTE — Care Management Obs Status (Signed)
[place] NOTIFICATION   Patient Details  Name: Amy Carroll MRN: [id number] Date of Birth: [birth year]   Medicare Observation Status Notification Given:  Yes    Trish Mage, LCSW 02/19/2019, 9:29 AM

## 2019-02-19 NOTE — Progress Notes (Signed)
Physical Therapy Treatment Patient Details Name: Amy Carroll MRN: [id number] DOB: 01-09-1935 Today's Date: 02/19/2019    History of Present Illness Patient is 84 y.o. female s/p Rt THA anterior approach on 02/18/19 with PMH significant for HTNN, and OA.    PT Comments    Pt cooperative but requiring increased time and cues for task completion and ltd by pain/fatigue.   Follow Up Recommendations  Follow surgeon's recommendation for DC plan and follow-up therapies;Home health PT     Equipment Recommendations  Rolling walker with 5" wheels    Recommendations for Other Services       Precautions / Restrictions Precautions Precautions: Fall Restrictions Weight Bearing Restrictions: No    Mobility  Bed Mobility               General bed mobility comments: Pt up in chair and returned to chair 2* fatigue/pain with ambulation  Transfers Overall transfer level: Needs assistance Equipment used: Rolling walker (2 wheeled) Transfers: Sit to/from Stand Sit to Stand: Min assist         General transfer comment: cues for hand placement and technique with RW, assist required to initiate power up and rise.  Ambulation/Gait Ambulation/Gait assistance: Min assist;Min guard Gait Distance (Feet): 50 Feet Assistive device: Rolling walker (2 wheeled) Gait Pattern/deviations: Decreased stride length;Decreased step length - left;Decreased stance time - right;Narrow base of support;Antalgic;Decreased step length - right;Step-to pattern Gait velocity: slow   General Gait Details: verbal cues for hand placement and assist for RW positioning, pt improved walker management as she continued. pt with no overt LOB, pt slow with short step length bil.   Stairs             Wheelchair Mobility    Modified Rankin (Stroke Patients Only)       Balance Overall balance assessment: Needs assistance Sitting-balance support: Feet supported Sitting balance-Leahy Scale: Good      Standing balance support: During functional activity;Bilateral upper extremity supported Standing balance-Leahy Scale: Fair                              Cognition Arousal/Alertness: Awake/alert Behavior During Therapy: WFL for tasks assessed/performed Overall Cognitive Status: Within Functional Limits for tasks assessed                                        Exercises Total Joint Exercises Ankle Circles/Pumps: AROM;Seated;Both;15 reps Quad Sets: AROM;Right;10 reps;Supine Heel Slides: AROM;Right;20 reps;Supine Hip ABduction/ADduction: AAROM;Right;15 reps;Supine    General Comments        Pertinent Vitals/Pain Pain Assessment: 0-10 Pain Score: 6  Pain Location: Rt hip Pain Descriptors / Indicators: Aching;Sore Pain Intervention(s): Limited activity within patient's tolerance;Monitored during session;Premedicated before session;Ice applied    Home Living                      Prior Function            PT Goals (current goals can now be found in the care plan section) Acute Rehab PT Goals Patient Stated Goal: to return home and regain independence PT Goal Formulation: With patient Time For Goal Achievement: 02/25/19 Potential to Achieve Goals: Good Progress towards PT goals: Progressing toward goals    Frequency    7X/week      PT Plan Current plan remains appropriate  Co-evaluation              AM-PAC PT "6 Clicks" Mobility   Outcome Measure  Help needed turning from your back to your side while in a flat bed without using bedrails?: A Little Help needed moving from lying on your back to sitting on the side of a flat bed without using bedrails?: A Little Help needed moving to and from a bed to a chair (including a wheelchair)?: A Little Help needed standing up from a chair using your arms (e.g., wheelchair or bedside chair)?: A Little Help needed to walk in hospital room?: A Little Help needed climbing 3-5  steps with a railing? : A Little 6 Click Score: 18    End of Session Equipment Utilized During Treatment: Gait belt Activity Tolerance: Patient tolerated treatment well Patient left: with call bell/phone within reach;in chair;with chair alarm set Nurse Communication: Mobility status PT Visit Diagnosis: Muscle weakness (generalized) (M62.81);Difficulty in walking, not elsewhere classified (R26.2)     Time: 4098-1191 PT Time Calculation (min) (ACUTE ONLY): 25 min  Charges:  $Gait Training: 23-37 mins $Therapeutic Exercise: 23-37 mins                     Amy Carroll PT Acute Rehabilitation Services Pager [phone] Office [phone]    Amy Carroll 02/19/2019, 10:49 AM

## 2019-02-19 NOTE — Progress Notes (Signed)
Physical Therapy Treatment Patient Details Name: Amy Carroll MRN: [id number] DOB: October 16, 1934 Today's Date: 02/19/2019    History of Present Illness Patient is 84 y.o. female s/p Rt THA anterior approach on 02/18/19 with PMH significant for HTNN, and OA.    PT Comments    Pt assisted up in bathroom and to sink to perform hygiene.  Ambulated to bedside to perform bed mobility tasks with cues and assist.  Pt up from bed to recliner.  Pt hopeful for dc home tomorrow.   Follow Up Recommendations  Follow surgeon's recommendation for DC plan and follow-up therapies;Home health PT     Equipment Recommendations  Rolling walker with 5" wheels    Recommendations for Other Services       Precautions / Restrictions Precautions Precautions: Fall Restrictions Weight Bearing Restrictions: No    Mobility  Bed Mobility Overal bed mobility: Needs Assistance Bed Mobility: Supine to Sit;Sit to Supine     Supine to sit: Min assist Sit to supine: Min assist;Mod assist   General bed mobility comments: cues for sequence and use of L LE to self assist; physical assist to manage R LE off bed and bil LEs onto bed  Transfers Overall transfer level: Needs assistance Equipment used: Rolling walker (2 wheeled) Transfers: Sit to/from Stand Sit to Stand: Min assist         General transfer comment: cues for hand placement and technique with RW  Ambulation/Gait Ambulation/Gait assistance: Min guard Gait Distance (Feet): 35 Feet Assistive device: Rolling walker (2 wheeled) Gait Pattern/deviations: Decreased stride length;Decreased step length - left;Decreased stance time - right;Narrow base of support;Antalgic;Decreased step length - right;Step-to pattern Gait velocity: slow   General Gait Details: cues for posture and position from RW; noted improvement in stability and speed   Stairs             Wheelchair Mobility    Modified Rankin (Stroke Patients Only)        Balance Overall balance assessment: Needs assistance Sitting-balance support: Feet supported Sitting balance-Leahy Scale: Good     Standing balance support: During functional activity;Bilateral upper extremity supported Standing balance-Leahy Scale: Fair                              Cognition Arousal/Alertness: Awake/alert Behavior During Therapy: WFL for tasks assessed/performed Overall Cognitive Status: Within Functional Limits for tasks assessed                                        Exercises      General Comments        Pertinent Vitals/Pain Pain Assessment: 0-10 Pain Score: 4  Pain Location: Rt hip Pain Descriptors / Indicators: Aching;Sore Pain Intervention(s): Limited activity within patient's tolerance;Monitored during session;Premedicated before session;Ice applied    Home Living                      Prior Function            PT Goals (current goals can now be found in the care plan section) Acute Rehab PT Goals Patient Stated Goal: to return home and regain independence PT Goal Formulation: With patient Time For Goal Achievement: 02/25/19 Potential to Achieve Goals: Good Progress towards PT goals: Progressing toward goals    Frequency    7X/week  PT Plan Current plan remains appropriate    Co-evaluation              AM-PAC PT "6 Clicks" Mobility   Outcome Measure  Help needed turning from your back to your side while in a flat bed without using bedrails?: A Little Help needed moving from lying on your back to sitting on the side of a flat bed without using bedrails?: A Little Help needed moving to and from a bed to a chair (including a wheelchair)?: A Little Help needed standing up from a chair using your arms (e.g., wheelchair or bedside chair)?: A Little Help needed to walk in hospital room?: A Little Help needed climbing 3-5 steps with a railing? : A Little 6 Click Score: 18    End of  Session Equipment Utilized During Treatment: Gait belt Activity Tolerance: Patient tolerated treatment well Patient left: in chair;with call bell/phone within reach Nurse Communication: Mobility status PT Visit Diagnosis: Muscle weakness (generalized) (M62.81);Difficulty in walking, not elsewhere classified (R26.2)     Time: 9811-9147 PT Time Calculation (min) (ACUTE ONLY): 25 min  Charges:  $Gait Training: 8-22 mins $Therapeutic Activity: 8-22 mins                     Mauro Kaufmann PT Acute Rehabilitation Services Pager [phone] Office [phone]    Nikoloz Huy 02/19/2019, 5:26 PM

## 2019-02-20 LAB — BASIC METABOLIC PANEL
Anion gap: 9 (ref 5–15)
BUN: 23 mg/dL (ref 8–23)
CO2: 26 mmol/L (ref 22–32)
Calcium: 8.8 mg/dL — ABNORMAL LOW (ref 8.9–10.3)
Chloride: 102 mmol/L (ref 98–111)
Creatinine, Ser: 0.77 mg/dL (ref 0.44–1.00)
GFR calc Af Amer: >60 mL/min (ref >60)
GFR calc non Af Amer: >60 mL/min (ref >60)
Glucose, Bld: 107 mg/dL — ABNORMAL HIGH (ref 70–99)
Potassium: 3.5 mmol/L (ref 3.5–5.1)
Sodium: 137 mmol/L (ref 135–145)

## 2019-02-20 LAB — CBC
HCT: 29.6 % — ABNORMAL LOW (ref 36.0–46.0)
Hemoglobin: 9.5 g/dL — ABNORMAL LOW (ref 12.0–15.0)
MCH: 33.5 pg (ref 26.0–34.0)
MCHC: 32.1 g/dL (ref 30.0–36.0)
MCV: 104.2 fL — ABNORMAL HIGH (ref 80.0–100.0)
Platelets: 182 10*3/uL (ref 150–400)
RBC: 2.84 MIL/uL — ABNORMAL LOW (ref 3.87–5.11)
RDW: 12.6 % (ref 11.5–15.5)
WBC: 8.6 10*3/uL (ref 4.0–10.5)
nRBC: 0 % (ref 0.0–0.2)

## 2019-02-20 LAB — SARS CORONAVIRUS 2 (TAT 6-24 HRS): SARS Coronavirus 2: NEGATIVE

## 2019-02-20 NOTE — Progress Notes (Signed)
Physical Therapy Treatment Patient Details Name: Amy Carroll MRN: [id number] DOB: [birth year] Today's Date: 02/20/2019    History of Present Illness Patient is 84 y.o. female s/p Rt THA anterior approach on 02/18/19 with PMH significant for HTNN, and OA.    PT Comments    Pt performed home therex program with assist.  Written instruction provided in writing and reviewed.   Follow Up Recommendations  Follow surgeon's recommendation for DC plan and follow-up therapies;Home health PT     Equipment Recommendations  Rolling walker with 5" wheels    Recommendations for Other Services       Precautions / Restrictions Precautions Precautions: Fall Restrictions Weight Bearing Restrictions: No    Mobility  Bed Mobility               General bed mobility comments: Pt declines to attempt stating she feels comfortable with performing task.  RN reports that pt has taken herself to the bathroom unassisted from the bed  Transfers Overall transfer level: Needs assistance Equipment used: Rolling walker (2 wheeled) Transfers: Sit to/from Stand Sit to Stand: Min guard;Supervision         General transfer comment: cues for hand placement and technique with RW  Ambulation/Gait Ambulation/Gait assistance: Min guard;Supervision Gait Distance (Feet): 150 Feet Assistive device: Rolling walker (2 wheeled) Gait Pattern/deviations: Step-through pattern;Decreased step length - right;Decreased step length - left;Shuffle;Trunk flexed     General Gait Details: cues for posture and position from RW; noted improvement in stability and speed   Stairs Stairs: Yes Stairs assistance: Min assist Stair Management: No rails;Step to pattern;Backwards;With walker Number of Stairs: 4 General stair comments: single step 4x bkwd with cues for sequence and foot placement   Wheelchair Mobility    Modified Rankin (Stroke Patients Only)       Balance Overall balance assessment: Needs  assistance Sitting-balance support: Feet supported Sitting balance-Leahy Scale: Good     Standing balance support: During functional activity;Bilateral upper extremity supported Standing balance-Leahy Scale: Fair                              Cognition Arousal/Alertness: Awake/alert Behavior During Therapy: WFL for tasks assessed/performed Overall Cognitive Status: Within Functional Limits for tasks assessed                                 General Comments: Pt appears forgetful with increased repetition of information required      Exercises Total Joint Exercises Ankle Circles/Pumps: AROM;Seated;Both;15 reps Quad Sets: AROM;Right;10 reps;Supine Heel Slides: AROM;Right;20 reps;Supine Hip ABduction/ADduction: AAROM;Right;15 reps;Supine Long Arc Quad: AAROM;AROM;Right;10 reps;Seated    General Comments        Pertinent Vitals/Pain Pain Assessment: 0-10 Pain Score: 4  Pain Location: Rt hip Pain Descriptors / Indicators: Aching;Sore Pain Intervention(s): Limited activity within patient's tolerance;Monitored during session;Premedicated before session    Home Living                      Prior Function            PT Goals (current goals can now be found in the care plan section) Acute Rehab PT Goals Patient Stated Goal: to return home and regain independence PT Goal Formulation: With patient Time For Goal Achievement: 02/25/19 Potential to Achieve Goals: Good Progress towards PT goals: Progressing toward goals    Frequency  7X/week      PT Plan Current plan remains appropriate    Co-evaluation              AM-PAC PT "6 Clicks" Mobility   Outcome Measure  Help needed turning from your back to your side while in a flat bed without using bedrails?: A Little Help needed moving from lying on your back to sitting on the side of a flat bed without using bedrails?: A Little Help needed moving to and from a bed to a chair  (including a wheelchair)?: A Little Help needed standing up from a chair using your arms (e.g., wheelchair or bedside chair)?: A Little Help needed to walk in hospital room?: A Little Help needed climbing 3-5 steps with a railing? : A Little 6 Click Score: 18    End of Session Equipment Utilized During Treatment: Gait belt Activity Tolerance: Patient tolerated treatment well Patient left: in chair;with call bell/phone within reach Nurse Communication: Mobility status PT Visit Diagnosis: Muscle weakness (generalized) (M62.81);Difficulty in walking, not elsewhere classified (R26.2)     Time: 1245-1311 PT Time Calculation (min) (ACUTE ONLY): 26 min  Charges:  $Gait Training: 8-22 mins $Therapeutic Exercise: 23-37 mins $Therapeutic Activity: 8-22 mins                     Mauro Kaufmann PT Acute Rehabilitation Services Pager [phone] Office [phone]    Amy Carroll 02/20/2019, 2:03 PM

## 2019-02-20 NOTE — TOC Transition Note (Signed)
Transition of Care [facility]) - CM/SW Discharge Note   Patient Details  Name: KIARALEE VALDIVIEZO MRN: [id number] Date of Birth: 04/22/1934  Transition of Care [facility]) CM/SW Contact:  Dessa Phi, RN Phone Number: 02/20/2019, 9:35 AM   Clinical Narrative:  Steva Ready from office has set up all d/c needs. No CM needs.           Patient Goals and CMS Choice        Discharge Placement                       Discharge Plan and Services                DME Arranged: Walker rolling DME Agency: [employer]       HH Arranged: PT [place] Agency: ([place])        Social Determinants of Health (SDOH) Interventions     Readmission Risk Interventions No flowsheet data found.

## 2019-02-20 NOTE — Progress Notes (Signed)
Orthopedics Progress Note  Subjective: Patient having some pain this morning but well controlled with po meds. Able to get up to the restroom by herself  Objective:  Vitals:   02/19/19 2045 02/20/19 0426  BP: (!) 115/53 134/68  Pulse: 92 88  Resp: 16 16  Temp: 97.9 F (36.6 C) 98 F (36.7 C)  SpO2: 97% 98%    General: Awake and alert  Musculoskeletal: right hip wound dressed with no drainage and no swelling. No pain with AROM of the ankle. Neg Homans. Leg lengths equal Neurovascularly intact  Lab Results  Component Value Date   WBC 8.6 02/20/2019   HGB 9.5 (L) 02/20/2019   HCT 29.6 (L) 02/20/2019   MCV 104.2 (H) 02/20/2019   PLT 182 02/20/2019       Component Value Date/Time   NA 137 02/20/2019 0422   K 3.5 02/20/2019 0422   CL 102 02/20/2019 0422   CO2 26 02/20/2019 0422   GLUCOSE 107 (H) 02/20/2019 0422   BUN 23 02/20/2019 0422   CREATININE 0.77 02/20/2019 0422   CALCIUM 8.8 (L) 02/20/2019 0422   GFRNONAA >60 02/20/2019 0422   GFRAA >60 02/20/2019 0422    Lab Results  Component Value Date   INR 0.9 02/16/2019   INR 0.9 08/01/2018   INR 2.26 (H) 05/30/2012    Assessment/Plan: POD #2 s/p Procedure(s): TOTAL HIP ARTHROPLASTY ANTERIOR APPROACH Discharge to home  Doran Heater. Veverly Fells, MD 02/20/2019 7:48 AM

## 2019-02-20 NOTE — Progress Notes (Signed)
Physical Therapy Treatment Patient Details Name: Amy Carroll MRN: [id number] DOB: October 22, 1934 Today's Date: 02/20/2019    History of Present Illness Patient is 84 y.o. female s/p Rt THA anterior approach on 02/18/19 with PMH significant for HTNN, and OA.    PT Comments    Pt up in bathroom and ambulating increased distance in hall with noted improvement in speed.  Pt up/down single step multiple times to practice for stool into high bed - written instruction provided.   Follow Up Recommendations  Follow surgeon's recommendation for DC plan and follow-up therapies;Home health PT     Equipment Recommendations  Rolling walker with 5" wheels    Recommendations for Other Services       Precautions / Restrictions Precautions Precautions: Fall Restrictions Weight Bearing Restrictions: No    Mobility  Bed Mobility               General bed mobility comments: Pt declines to attempt stating she feels comfortable with performing task.  RN reports that pt has taken herself to the bathroom unassisted from the bed  Transfers Overall transfer level: Needs assistance Equipment used: Rolling walker (2 wheeled) Transfers: Sit to/from Stand Sit to Stand: Min guard;Supervision         General transfer comment: cues for hand placement and technique with RW  Ambulation/Gait Ambulation/Gait assistance: Min guard;Supervision Gait Distance (Feet): 150 Feet Assistive device: Rolling walker (2 wheeled) Gait Pattern/deviations: Step-through pattern;Decreased step length - right;Decreased step length - left;Shuffle;Trunk flexed     General Gait Details: cues for posture and position from RW; noted improvement in stability and speed   Stairs Stairs: Yes Stairs assistance: Min assist Stair Management: No rails;Step to pattern;Backwards;With walker Number of Stairs: 4 General stair comments: single step 4x bkwd with cues for sequence and foot placement   Wheelchair  Mobility    Modified Rankin (Stroke Patients Only)       Balance Overall balance assessment: Needs assistance Sitting-balance support: Feet supported Sitting balance-Leahy Scale: Good     Standing balance support: During functional activity;Bilateral upper extremity supported Standing balance-Leahy Scale: Fair                              Cognition Arousal/Alertness: Awake/alert Behavior During Therapy: WFL for tasks assessed/performed Overall Cognitive Status: Within Functional Limits for tasks assessed                                        Exercises      General Comments        Pertinent Vitals/Pain Pain Assessment: 0-10 Pain Score: 4  Pain Location: Rt hip Pain Descriptors / Indicators: Aching;Sore Pain Intervention(s): Limited activity within patient's tolerance;Monitored during session;Premedicated before session    Home Living                      Prior Function            PT Goals (current goals can now be found in the care plan section) Acute Rehab PT Goals Patient Stated Goal: to return home and regain independence PT Goal Formulation: With patient Time For Goal Achievement: 02/25/19 Potential to Achieve Goals: Good Progress towards PT goals: Progressing toward goals    Frequency    7X/week      PT Plan Current plan remains appropriate  Co-evaluation              AM-PAC PT "6 Clicks" Mobility   Outcome Measure  Help needed turning from your back to your side while in a flat bed without using bedrails?: A Little Help needed moving from lying on your back to sitting on the side of a flat bed without using bedrails?: A Little Help needed moving to and from a bed to a chair (including a wheelchair)?: A Little Help needed standing up from a chair using your arms (e.g., wheelchair or bedside chair)?: A Little Help needed to walk in hospital room?: A Little Help needed climbing 3-5 steps with a  railing? : A Little 6 Click Score: 18    End of Session Equipment Utilized During Treatment: Gait belt Activity Tolerance: Patient tolerated treatment well Patient left: in chair;with call bell/phone within reach Nurse Communication: Mobility status PT Visit Diagnosis: Muscle weakness (generalized) (M62.81);Difficulty in walking, not elsewhere classified (R26.2)     Time: 8413-2440 PT Time Calculation (min) (ACUTE ONLY): 31 min  Charges:  $Gait Training: 8-22 mins $Therapeutic Activity: 8-22 mins                     Mauro Kaufmann PT Acute Rehabilitation Services Pager [phone] Office [phone]    Raegen Tarpley 02/20/2019, 1:58 PM

## 2019-02-20 NOTE — Discharge Summary (Signed)
Orthopedic Discharge Summary        Physician Discharge Summary  Patient ID: Amy Carroll MRN: [id number] DOB/AGE: May 03, 1934 84 y.o.  Admit date: 02/18/2019 Discharge date: 02/20/2019   Procedures:  Procedure(s) (LRB): TOTAL HIP ARTHROPLASTY ANTERIOR APPROACH (Right)  Attending Physician:  Dr. Hector Shade  Admission Diagnoses:   Right hip OA  Discharge Diagnoses:  same   Past Medical History:  Diagnosis Date  . Arthritis   . Hypertension     PCP: Merrilee Seashore, MD   Discharged Condition: good  Hospital Course:  Patient underwent the above stated procedure on 02/18/2019. Patient tolerated the procedure well and brought to the recovery room in good condition and subsequently to the floor. Patient had an uncomplicated hospital course and was stable for discharge.   Disposition: Discharge disposition: 01-Home or Self Care      with follow up in 2 weeks   Follow-up Information    Gaynelle Arabian, MD. Go on 03/03/2019.   Specialty: Orthopedic Surgery Why: You are scheduled for a post-operative appointment on 03-03-19 at 3:30 pm.  Contact information: [address] [place] [address] [id number]           Discharge Instructions    Call MD / Call 911   Complete by: As directed    If you experience chest pain or shortness of breath, CALL 911 and be transported to the hospital emergency room.  If you develope a fever above 101 F, pus (white drainage) or increased drainage or redness at the wound, or calf pain, call your surgeon's office.   Call MD / Call 911   Complete by: As directed    If you experience chest pain or shortness of breath, CALL 911 and be transported to the hospital emergency room.  If you develope a fever above 101 F, pus (white drainage) or increased drainage or redness at the wound, or calf pain, call your surgeon's office.   Change dressing   Complete by: As directed    You may change your dressing on  Friday, then change the dressing daily with sterile 4 x 4 inch gauze dressing and paper tape.   Constipation Prevention   Complete by: As directed    Drink plenty of fluids.  Prune juice may be helpful.  You may use a stool softener, such as Colace (over the counter) 100 mg twice a day.  Use MiraLax (over the counter) for constipation as needed.   Constipation Prevention   Complete by: As directed    Drink plenty of fluids.  Prune juice may be helpful.  You may use a stool softener, such as Colace (over the counter) 100 mg twice a day.  Use MiraLax (over the counter) for constipation as needed.   Diet - low sodium heart healthy   Complete by: As directed    Discharge instructions   Complete by: As directed    Dr. Gaynelle Arabian Total Joint Specialist [facility] [address] [place], [place] [address] [phone]  ANTERIOR APPROACH TOTAL HIP REPLACEMENT POSTOPERATIVE DIRECTIONS   Hip Rehabilitation, Guidelines Following Surgery  The results of a hip operation are greatly improved after range of motion and muscle strengthening exercises. Follow all safety measures which are given to protect your hip. If any of these exercises cause increased pain or swelling in your joint, decrease the amount until you are comfortable again. Then slowly increase the exercises. Call your caregiver if you have problems or questions.  HOME CARE INSTRUCTIONS  Remove items at home which could result in a fall. This includes throw rugs or furniture in walking pathways.  ICE to the affected hip every three hours for 30 minutes at a time and then as needed for pain and swelling.  Continue to use ice on the hip for pain and swelling from surgery. You may notice swelling that will progress down to the foot and ankle.  This is normal after surgery.  Elevate the leg when you are not up walking on it.   Continue to use the breathing machine which will help keep your temperature down.  It is common for  your temperature to cycle up and down following surgery, especially at night when you are not up moving around and exerting yourself.  The breathing machine keeps your lungs expanded and your temperature down.  DIET You may resume your previous home diet once your are discharged from the hospital.  DRESSING / WOUND CARE / SHOWERING You may change your dressing 3-5 days after surgery.  Then change the dressing every day with sterile gauze.  Please use good hand washing techniques before changing the dressing.  Do not use any lotions or creams on the incision until instructed by your surgeon. You may start showering once you are discharged home but do not submerge the incision under water. Just pat the incision dry and apply a dry gauze dressing on daily. Change the surgical dressing daily and reapply a dry dressing each time.  ACTIVITY Walk with your walker as instructed. Use walker as long as suggested by your caregivers. Avoid periods of inactivity such as sitting longer than an hour when not asleep. This helps prevent blood clots.  You may resume a sexual relationship in one month or when given the OK by your doctor.  You may return to work once you are cleared by your doctor.  Do not drive a car for 6 weeks or until released by you surgeon.  Do not drive while taking narcotics.  WEIGHT BEARING Weight bearing as tolerated with assist device (walker, cane, etc) as directed, use it as long as suggested by your surgeon or therapist, typically at least 4-6 weeks.  POSTOPERATIVE CONSTIPATION PROTOCOL Constipation - defined medically as fewer than three stools per week and severe constipation as less than one stool per week.  One of the most common issues patients have following surgery is constipation.  Even if you have a regular bowel pattern at home, your normal regimen is likely to be disrupted due to multiple reasons following surgery.  Combination of anesthesia, postoperative narcotics,  change in appetite and fluid intake all can affect your bowels.  In order to avoid complications following surgery, here are some recommendations in order to help you during your recovery period.  Colace (docusate) - Pick up an over-the-counter form of Colace or another stool softener and take twice a day as long as you are requiring postoperative pain medications.  Take with a full glass of water daily.  If you experience loose stools or diarrhea, hold the colace until you stool forms back up.  If your symptoms do not get better within 1 week or if they get worse, check with your doctor.  Dulcolax (bisacodyl) - Pick up over-the-counter and take as directed by the product packaging as needed to assist with the movement of your bowels.  Take with a full glass of water.  Use this product as needed if not relieved by Colace only.  MiraLax (polyethylene glycol) - Pick up over-the-counter to have on hand.  MiraLax is a solution that will increase the amount of water in your bowels to assist with bowel movements.  Take as directed and can mix with a glass of water, juice, soda, coffee, or tea.  Take if you go more than two days without a movement. Do not use MiraLax more than once per day. Call your doctor if you are still constipated or irregular after using this medication for 7 days in a row.  If you continue to have problems with postoperative constipation, please contact the office for further assistance and recommendations.  If you experience "the worst abdominal pain ever" or develop nausea or vomiting, please contact the office immediatly for further recommendations for treatment.  ITCHING  If you experience itching with your medications, try taking only a single pain pill, or even half a pain pill at a time.  You can also use Benadryl over the counter for itching or also to help with sleep.   TED HOSE STOCKINGS Wear the elastic stockings on both legs for three weeks following surgery during the  day but you may remove then at night for sleeping.  MEDICATIONS See your medication summary on the "After Visit Summary" that the nursing staff will review with you prior to discharge.  You may have some home medications which will be placed on hold until you complete the course of blood thinner medication.  It is important for you to complete the blood thinner medication as prescribed by your surgeon.  Continue your approved medications as instructed at time of discharge.  PRECAUTIONS If you experience chest pain or shortness of breath - call 911 immediately for transfer to the hospital emergency department.  If you develop a fever greater that 101 F, purulent drainage from wound, increased redness or drainage from wound, foul odor from the wound/dressing, or calf pain - CONTACT YOUR SURGEON.                                                   FOLLOW-UP APPOINTMENTS Make sure you keep all of your appointments after your operation with your surgeon and caregivers. You should call the office at the above phone number and make an appointment for approximately two weeks after the date of your surgery or on the date instructed by your surgeon outlined in the "After Visit Summary".  RANGE OF MOTION AND STRENGTHENING EXERCISES  These exercises are designed to help you keep full movement of your hip joint. Follow your caregiver's or physical therapist's instructions. Perform all exercises about fifteen times, three times per day or as directed. Exercise both hips, even if you have had only one joint replacement. These exercises can be done on a training (exercise) mat, on the floor, on a table or on a bed. Use whatever works the best and is most comfortable for you. Use music or television while you are exercising so that the exercises are a pleasant break in your day. This will make your life better with the exercises acting as a break in routine you can look forward to.  Lying on your back, slowly slide your  foot toward your buttocks, raising your knee up off the floor. Then slowly slide your foot back down until your leg is straight again.  Lying on your back spread  your legs as far apart as you can without causing discomfort.  Lying on your side, raise your upper leg and foot straight up from the floor as far as is comfortable. Slowly lower the leg and repeat.  Lying on your back, tighten up the muscle in the front of your thigh (quadriceps muscles). You can do this by keeping your leg straight and trying to raise your heel off the floor. This helps strengthen the largest muscle supporting your knee.  Lying on your back, tighten up the muscles of your buttocks both with the legs straight and with the knee bent at a comfortable angle while keeping your heel on the floor.   IF YOU ARE TRANSFERRED TO A SKILLED REHAB FACILITY If the patient is transferred to a skilled rehab facility following release from the hospital, a list of the current medications will be sent to the facility for the patient to continue.  When discharged from the skilled rehab facility, please have the facility set up the patient's [place] prior to being released. Also, the skilled facility will be responsible for providing the patient with their medications at time of release from the facility to include their pain medication, the muscle relaxants, and their blood thinner medication. If the patient is still at the rehab facility at time of the two week follow up appointment, the skilled rehab facility will also need to assist the patient in arranging follow up appointment in our office and any transportation needs.  MAKE SURE YOU:  Understand these instructions.  Get help right away if you are not doing well or get worse.    Pick up stool softner and laxative for home use following surgery while on pain medications. Do not submerge incision under water. Please use good hand washing techniques while changing  dressing each day. May shower starting three days after surgery. Please use a clean towel to pat the incision dry following showers. Continue to use ice for pain and swelling after surgery. Do not use any lotions or creams on the incision until instructed by your surgeon.   Do not sit on low chairs, stoools or toilet seats, as it may be difficult to get up from low surfaces   Complete by: As directed    Driving restrictions   Complete by: As directed    No driving for two weeks   Increase activity slowly as tolerated   Complete by: As directed    TED hose   Complete by: As directed    Use stockings (TED hose) for three weeks on both leg(s).  You may remove them at night for sleeping.   Weight bearing as tolerated   Complete by: As directed       Allergies as of 02/20/2019   No Known Allergies     Medication List    STOP taking these medications   celecoxib 200 MG capsule Commonly known as: CELEBREX     TAKE these medications   acetaminophen 325 MG tablet Commonly known as: TYLENOL Take 650 mg by mouth every 6 (six) hours as needed for moderate pain.   amLODipine 5 MG tablet Commonly known as: NORVASC Take 5 mg by mouth daily.   aspirin 325 MG EC tablet Take 1 tablet (325 mg total) by mouth 2 (two) times daily for 20 days. Then take one 81 mg aspirin once a day for three weeks. Then discontinue aspirin.   cholecalciferol 25 MCG (1000 UT) tablet Commonly known as: VITAMIN D3  Take 1,000 Units by mouth daily.   Combigan 0.2-0.5 % ophthalmic solution Generic drug: brimonidine-timolol Place 1 drop into the right eye daily.   dorzolamide 2 % ophthalmic solution Commonly known as: TRUSOPT Place 1 drop into the right eye 2 (two) times daily.   HYDROcodone-acetaminophen 5-325 MG tablet Commonly known as: NORCO/VICODIN Take 1-2 tablets by mouth every 6 (six) hours as needed for severe pain.   latanoprost 0.005 % ophthalmic solution Commonly known as: XALATAN Place 1  drop into both eyes at bedtime.   methocarbamol 500 MG tablet Commonly known as: ROBAXIN Take 1 tablet (500 mg total) by mouth every 6 (six) hours as needed for muscle spasms.   olmesartan-hydrochlorothiazide 40-25 MG tablet Commonly known as: BENICAR HCT Take 1 tablet by mouth daily.   PRESERVISION AREDS 2 PO Take 2 capsules by mouth daily.   traMADol 50 MG tablet Commonly known as: ULTRAM Take 1-2 tablets (50-100 mg total) by mouth every 6 (six) hours as needed for moderate pain.            Discharge Care Instructions  (From admission, onward)         Start     Ordered   02/19/19 0000  Weight bearing as tolerated     02/19/19 0759   02/19/19 0000  Change dressing    Comments: You may change your dressing on Friday, then change the dressing daily with sterile 4 x 4 inch gauze dressing and paper tape.   02/19/19 0759            Signed: Augustin Schooling 02/20/2019, 7:53 AM  [facility] is now [facility] [address], [place], [place], [place] [address] Phone: [place]

## 2019-02-23 ENCOUNTER — Encounter: Payer: Self-pay | Admitting: *Deleted

## 2019-02-24 ENCOUNTER — Other Ambulatory Visit: Payer: Self-pay | Admitting: *Deleted

## 2019-02-24 ENCOUNTER — Encounter: Payer: Self-pay | Admitting: *Deleted

## 2019-02-24 DIAGNOSIS — R278 Other lack of coordination: Secondary | ICD-10-CM | POA: Diagnosis not present

## 2019-02-24 DIAGNOSIS — R2689 Other abnormalities of gait and mobility: Secondary | ICD-10-CM | POA: Diagnosis not present

## 2019-02-24 DIAGNOSIS — M25551 Pain in right hip: Secondary | ICD-10-CM | POA: Diagnosis not present

## 2019-02-24 DIAGNOSIS — M62561 Muscle wasting and atrophy, not elsewhere classified, right lower leg: Secondary | ICD-10-CM | POA: Diagnosis not present

## 2019-02-24 DIAGNOSIS — Z96641 Presence of right artificial hip joint: Secondary | ICD-10-CM | POA: Diagnosis not present

## 2019-02-24 DIAGNOSIS — Z471 Aftercare following joint replacement surgery: Secondary | ICD-10-CM | POA: Diagnosis not present

## 2019-02-24 NOTE — Patient Outreach (Signed)
Telephone outreach to follow up on [place] on [facility] CALL: Unsure about new medications.  Spoke with Amy Carroll today, his wife was sleeping. He reports that they got their medication questions answered yesterday. Pt did get 3 new prescriptions: hydrocodone/apap 5/325 mg, methocarbamol 500 mg and tramadol 50 mg. Amy Carroll states they picked up 2 but not the tramadol as she had tried it in the past and it did not agree with her.  He reports she took 2 hydrocodone at a time the first couple of days but is now down to one at a time and her pain is controlled.  We discussed options for ongoing pain control: reducing her hydrocodone to 1/2 tab if she doesn't feel like she needs a whole tablet and/or stopping the hydrocodone and taking plain APAP [id number] mg tid, not to exceed 3000 mg/day.   Amy Carroll states that his wife is most uncomfortable and tense at bed time. We discussed using the muscle relaxer at bed time and her choice of pain reliever to help her get comfortable and sleep.  Amy Carroll reports the therapist will come today to begin her PT.   No further issues identified.   Introduced [facility] and I will send them a letter so that they have our information for any future need.  [name]. [name], MSN, [facility] Gerontological Nurse Practitioner [facility] [phone]

## 2019-02-26 DIAGNOSIS — M25551 Pain in right hip: Secondary | ICD-10-CM | POA: Diagnosis not present

## 2019-02-26 DIAGNOSIS — Z96641 Presence of right artificial hip joint: Secondary | ICD-10-CM | POA: Diagnosis not present

## 2019-02-26 DIAGNOSIS — R278 Other lack of coordination: Secondary | ICD-10-CM | POA: Diagnosis not present

## 2019-02-26 DIAGNOSIS — M62561 Muscle wasting and atrophy, not elsewhere classified, right lower leg: Secondary | ICD-10-CM | POA: Diagnosis not present

## 2019-02-26 DIAGNOSIS — Z471 Aftercare following joint replacement surgery: Secondary | ICD-10-CM | POA: Diagnosis not present

## 2019-02-26 DIAGNOSIS — R2689 Other abnormalities of gait and mobility: Secondary | ICD-10-CM | POA: Diagnosis not present

## 2019-03-04 DIAGNOSIS — R2689 Other abnormalities of gait and mobility: Secondary | ICD-10-CM | POA: Diagnosis not present

## 2019-03-04 DIAGNOSIS — Z96641 Presence of right artificial hip joint: Secondary | ICD-10-CM | POA: Diagnosis not present

## 2019-03-04 DIAGNOSIS — R278 Other lack of coordination: Secondary | ICD-10-CM | POA: Diagnosis not present

## 2019-03-04 DIAGNOSIS — Z471 Aftercare following joint replacement surgery: Secondary | ICD-10-CM | POA: Diagnosis not present

## 2019-03-04 DIAGNOSIS — M25551 Pain in right hip: Secondary | ICD-10-CM | POA: Diagnosis not present

## 2019-03-04 DIAGNOSIS — M62561 Muscle wasting and atrophy, not elsewhere classified, right lower leg: Secondary | ICD-10-CM | POA: Diagnosis not present

## 2019-03-06 DIAGNOSIS — R278 Other lack of coordination: Secondary | ICD-10-CM | POA: Diagnosis not present

## 2019-03-06 DIAGNOSIS — R2689 Other abnormalities of gait and mobility: Secondary | ICD-10-CM | POA: Diagnosis not present

## 2019-03-06 DIAGNOSIS — Z96641 Presence of right artificial hip joint: Secondary | ICD-10-CM | POA: Diagnosis not present

## 2019-03-06 DIAGNOSIS — M25551 Pain in right hip: Secondary | ICD-10-CM | POA: Diagnosis not present

## 2019-03-06 DIAGNOSIS — M62561 Muscle wasting and atrophy, not elsewhere classified, right lower leg: Secondary | ICD-10-CM | POA: Diagnosis not present

## 2019-03-06 DIAGNOSIS — Z471 Aftercare following joint replacement surgery: Secondary | ICD-10-CM | POA: Diagnosis not present

## 2019-03-09 DIAGNOSIS — R2689 Other abnormalities of gait and mobility: Secondary | ICD-10-CM | POA: Diagnosis not present

## 2019-03-09 DIAGNOSIS — Z96641 Presence of right artificial hip joint: Secondary | ICD-10-CM | POA: Diagnosis not present

## 2019-03-09 DIAGNOSIS — R278 Other lack of coordination: Secondary | ICD-10-CM | POA: Diagnosis not present

## 2019-03-09 DIAGNOSIS — M62561 Muscle wasting and atrophy, not elsewhere classified, right lower leg: Secondary | ICD-10-CM | POA: Diagnosis not present

## 2019-03-09 DIAGNOSIS — M25551 Pain in right hip: Secondary | ICD-10-CM | POA: Diagnosis not present

## 2019-03-09 DIAGNOSIS — Z471 Aftercare following joint replacement surgery: Secondary | ICD-10-CM | POA: Diagnosis not present

## 2019-03-11 ENCOUNTER — Other Ambulatory Visit: Payer: Self-pay

## 2019-03-11 ENCOUNTER — Encounter (INDEPENDENT_AMBULATORY_CARE_PROVIDER_SITE_OTHER): Payer: Medicare Other | Admitting: Ophthalmology

## 2019-03-11 DIAGNOSIS — H353211 Exudative age-related macular degeneration, right eye, with active choroidal neovascularization: Secondary | ICD-10-CM

## 2019-03-11 DIAGNOSIS — Z23 Encounter for immunization: Secondary | ICD-10-CM | POA: Diagnosis not present

## 2019-03-11 DIAGNOSIS — H35033 Hypertensive retinopathy, bilateral: Secondary | ICD-10-CM

## 2019-03-11 DIAGNOSIS — H353122 Nonexudative age-related macular degeneration, left eye, intermediate dry stage: Secondary | ICD-10-CM

## 2019-03-11 DIAGNOSIS — H43813 Vitreous degeneration, bilateral: Secondary | ICD-10-CM | POA: Diagnosis not present

## 2019-03-11 DIAGNOSIS — I10 Essential (primary) hypertension: Secondary | ICD-10-CM | POA: Diagnosis not present

## 2019-03-24 DIAGNOSIS — Z96641 Presence of right artificial hip joint: Secondary | ICD-10-CM | POA: Diagnosis not present

## 2019-03-24 DIAGNOSIS — Z471 Aftercare following joint replacement surgery: Secondary | ICD-10-CM | POA: Diagnosis not present

## 2019-03-31 DIAGNOSIS — Z471 Aftercare following joint replacement surgery: Secondary | ICD-10-CM | POA: Diagnosis not present

## 2019-03-31 DIAGNOSIS — Z96641 Presence of right artificial hip joint: Secondary | ICD-10-CM | POA: Diagnosis not present

## 2019-03-31 DIAGNOSIS — M62561 Muscle wasting and atrophy, not elsewhere classified, right lower leg: Secondary | ICD-10-CM | POA: Diagnosis not present

## 2019-03-31 DIAGNOSIS — R2689 Other abnormalities of gait and mobility: Secondary | ICD-10-CM | POA: Diagnosis not present

## 2019-03-31 DIAGNOSIS — M25551 Pain in right hip: Secondary | ICD-10-CM | POA: Diagnosis not present

## 2019-03-31 DIAGNOSIS — R278 Other lack of coordination: Secondary | ICD-10-CM | POA: Diagnosis not present

## 2019-04-03 DIAGNOSIS — R2689 Other abnormalities of gait and mobility: Secondary | ICD-10-CM | POA: Diagnosis not present

## 2019-04-03 DIAGNOSIS — Z96641 Presence of right artificial hip joint: Secondary | ICD-10-CM | POA: Diagnosis not present

## 2019-04-03 DIAGNOSIS — M25551 Pain in right hip: Secondary | ICD-10-CM | POA: Diagnosis not present

## 2019-04-03 DIAGNOSIS — R278 Other lack of coordination: Secondary | ICD-10-CM | POA: Diagnosis not present

## 2019-04-03 DIAGNOSIS — M62561 Muscle wasting and atrophy, not elsewhere classified, right lower leg: Secondary | ICD-10-CM | POA: Diagnosis not present

## 2019-04-03 DIAGNOSIS — Z471 Aftercare following joint replacement surgery: Secondary | ICD-10-CM | POA: Diagnosis not present

## 2019-04-07 DIAGNOSIS — M25551 Pain in right hip: Secondary | ICD-10-CM | POA: Diagnosis not present

## 2019-04-07 DIAGNOSIS — R278 Other lack of coordination: Secondary | ICD-10-CM | POA: Diagnosis not present

## 2019-04-07 DIAGNOSIS — Z96641 Presence of right artificial hip joint: Secondary | ICD-10-CM | POA: Diagnosis not present

## 2019-04-07 DIAGNOSIS — M62561 Muscle wasting and atrophy, not elsewhere classified, right lower leg: Secondary | ICD-10-CM | POA: Diagnosis not present

## 2019-04-07 DIAGNOSIS — R2689 Other abnormalities of gait and mobility: Secondary | ICD-10-CM | POA: Diagnosis not present

## 2019-04-07 DIAGNOSIS — Z471 Aftercare following joint replacement surgery: Secondary | ICD-10-CM | POA: Diagnosis not present

## 2019-04-08 DIAGNOSIS — Z23 Encounter for immunization: Secondary | ICD-10-CM | POA: Diagnosis not present

## 2019-04-10 DIAGNOSIS — M62561 Muscle wasting and atrophy, not elsewhere classified, right lower leg: Secondary | ICD-10-CM | POA: Diagnosis not present

## 2019-04-10 DIAGNOSIS — R278 Other lack of coordination: Secondary | ICD-10-CM | POA: Diagnosis not present

## 2019-04-10 DIAGNOSIS — M25551 Pain in right hip: Secondary | ICD-10-CM | POA: Diagnosis not present

## 2019-04-10 DIAGNOSIS — Z471 Aftercare following joint replacement surgery: Secondary | ICD-10-CM | POA: Diagnosis not present

## 2019-04-10 DIAGNOSIS — Z96641 Presence of right artificial hip joint: Secondary | ICD-10-CM | POA: Diagnosis not present

## 2019-04-10 DIAGNOSIS — R2689 Other abnormalities of gait and mobility: Secondary | ICD-10-CM | POA: Diagnosis not present

## 2019-04-15 ENCOUNTER — Encounter: Payer: Self-pay | Admitting: *Deleted

## 2019-05-13 ENCOUNTER — Encounter (INDEPENDENT_AMBULATORY_CARE_PROVIDER_SITE_OTHER): Payer: Medicare Other | Admitting: Ophthalmology

## 2019-05-13 ENCOUNTER — Other Ambulatory Visit: Payer: Self-pay

## 2019-05-13 DIAGNOSIS — H353211 Exudative age-related macular degeneration, right eye, with active choroidal neovascularization: Secondary | ICD-10-CM

## 2019-05-13 DIAGNOSIS — I10 Essential (primary) hypertension: Secondary | ICD-10-CM

## 2019-05-13 DIAGNOSIS — H43813 Vitreous degeneration, bilateral: Secondary | ICD-10-CM

## 2019-05-13 DIAGNOSIS — H35033 Hypertensive retinopathy, bilateral: Secondary | ICD-10-CM | POA: Diagnosis not present

## 2019-05-13 DIAGNOSIS — H353122 Nonexudative age-related macular degeneration, left eye, intermediate dry stage: Secondary | ICD-10-CM | POA: Diagnosis not present

## 2019-06-11 DIAGNOSIS — I10 Essential (primary) hypertension: Secondary | ICD-10-CM | POA: Diagnosis not present

## 2019-06-11 DIAGNOSIS — R7303 Prediabetes: Secondary | ICD-10-CM | POA: Diagnosis not present

## 2019-06-11 DIAGNOSIS — Z0000 Encounter for general adult medical examination without abnormal findings: Secondary | ICD-10-CM | POA: Diagnosis not present

## 2019-06-11 DIAGNOSIS — M8949 Other hypertrophic osteoarthropathy, multiple sites: Secondary | ICD-10-CM | POA: Diagnosis not present

## 2019-06-18 DIAGNOSIS — M8949 Other hypertrophic osteoarthropathy, multiple sites: Secondary | ICD-10-CM | POA: Diagnosis not present

## 2019-06-18 DIAGNOSIS — R7303 Prediabetes: Secondary | ICD-10-CM | POA: Diagnosis not present

## 2019-06-18 DIAGNOSIS — R202 Paresthesia of skin: Secondary | ICD-10-CM | POA: Diagnosis not present

## 2019-06-18 DIAGNOSIS — I872 Venous insufficiency (chronic) (peripheral): Secondary | ICD-10-CM | POA: Diagnosis not present

## 2019-06-18 DIAGNOSIS — I10 Essential (primary) hypertension: Secondary | ICD-10-CM | POA: Diagnosis not present

## 2019-07-22 ENCOUNTER — Encounter (INDEPENDENT_AMBULATORY_CARE_PROVIDER_SITE_OTHER): Payer: Medicare Other | Admitting: Ophthalmology

## 2019-07-22 ENCOUNTER — Other Ambulatory Visit: Payer: Self-pay

## 2019-07-22 DIAGNOSIS — H43813 Vitreous degeneration, bilateral: Secondary | ICD-10-CM | POA: Diagnosis not present

## 2019-07-22 DIAGNOSIS — H35033 Hypertensive retinopathy, bilateral: Secondary | ICD-10-CM | POA: Diagnosis not present

## 2019-07-22 DIAGNOSIS — I10 Essential (primary) hypertension: Secondary | ICD-10-CM

## 2019-07-22 DIAGNOSIS — H353122 Nonexudative age-related macular degeneration, left eye, intermediate dry stage: Secondary | ICD-10-CM

## 2019-07-22 DIAGNOSIS — H353211 Exudative age-related macular degeneration, right eye, with active choroidal neovascularization: Secondary | ICD-10-CM | POA: Diagnosis not present

## 2019-07-31 DIAGNOSIS — Z96651 Presence of right artificial knee joint: Secondary | ICD-10-CM | POA: Diagnosis not present

## 2019-07-31 DIAGNOSIS — Z96641 Presence of right artificial hip joint: Secondary | ICD-10-CM | POA: Diagnosis not present

## 2019-07-31 DIAGNOSIS — M25869 Other specified joint disorders, unspecified knee: Secondary | ICD-10-CM | POA: Diagnosis not present

## 2019-08-03 DIAGNOSIS — H3530 Unspecified macular degeneration: Secondary | ICD-10-CM | POA: Diagnosis not present

## 2019-08-03 DIAGNOSIS — Z961 Presence of intraocular lens: Secondary | ICD-10-CM | POA: Diagnosis not present

## 2019-08-03 DIAGNOSIS — H401131 Primary open-angle glaucoma, bilateral, mild stage: Secondary | ICD-10-CM | POA: Diagnosis not present

## 2019-08-03 DIAGNOSIS — H2512 Age-related nuclear cataract, left eye: Secondary | ICD-10-CM | POA: Diagnosis not present

## 2019-08-03 DIAGNOSIS — H353121 Nonexudative age-related macular degeneration, left eye, early dry stage: Secondary | ICD-10-CM | POA: Diagnosis not present

## 2019-08-28 DIAGNOSIS — B029 Zoster without complications: Secondary | ICD-10-CM | POA: Diagnosis not present

## 2019-09-06 IMAGING — MR MR FEMUR*R* W/O CM
5 series · 29 of 40 positions shown · non-contrast
Comparison: Right knee x-rays dated October 21, 2017..

CLINICAL DATA: Diffuse right thigh pain for the past few months.
Prior right knee replacement.

EXAM:
MRI OF THE RIGHT FEMUR WITHOUT CONTRAST
TECHNIQUE: Multiplanar, multisequence MR imaging of the right femur was
performed. No intravenous contrast was administered.

[Series 6: STIR · coronal · 4.5mm · 1.31mm/px · 3 of 19 slices shown]
[im 1/19]
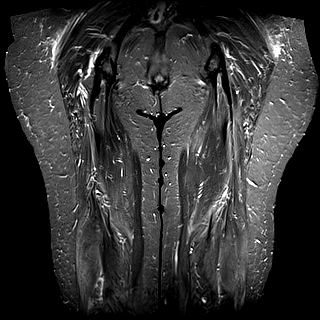
[im 10/19]
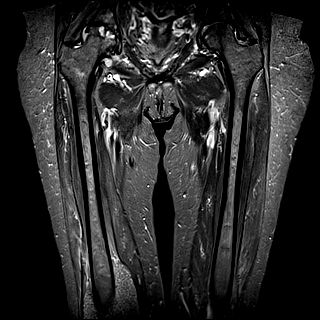
[im 19/19]
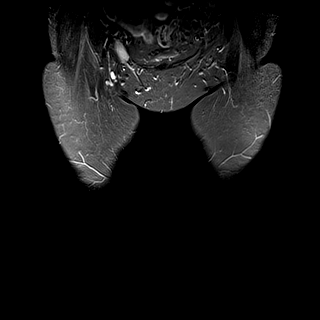

[Series 7: T1 · coronal · 4.5mm · 1.31mm/px · 4 of 19 slices shown (1 of 2)]
[im 1/19]
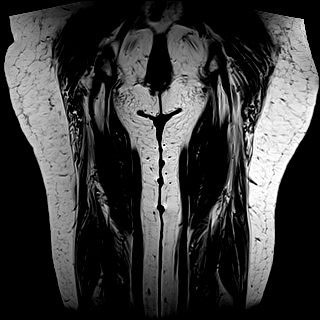
[im 7/19]
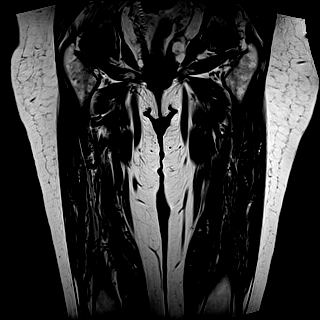
[im 13/19]
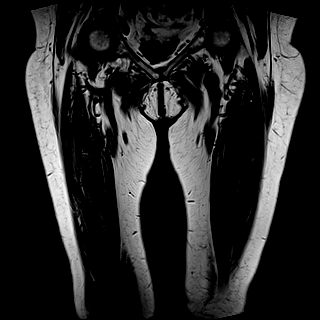
[im 19/19]
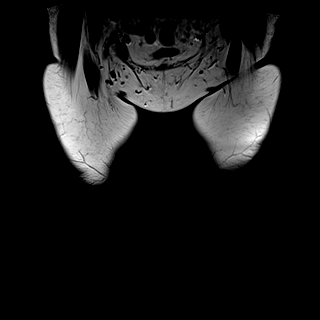

[Series 13: T1 · axial · 5.0mm · 0.43mm/px · z∈[-218,+195]mm · 9 of 70 slices shown (2 of 2)]
[im 1/70]
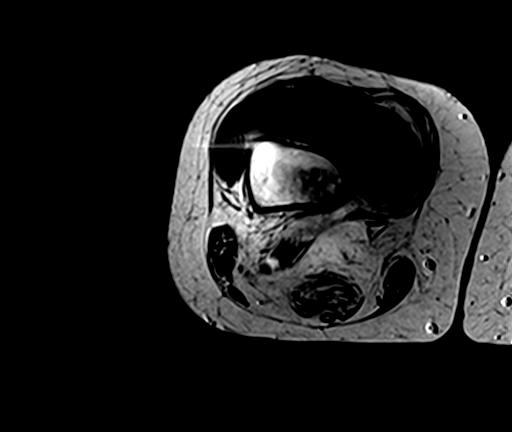
[im 6/70]
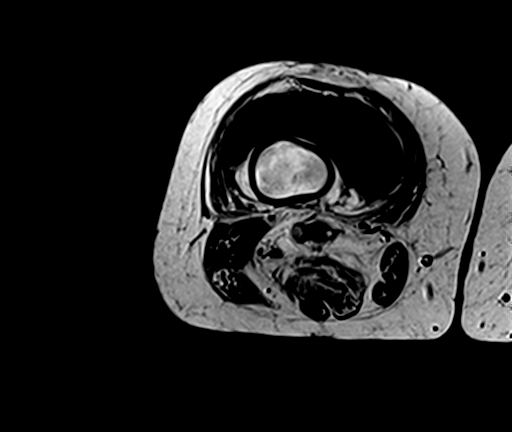
[im 11/70]
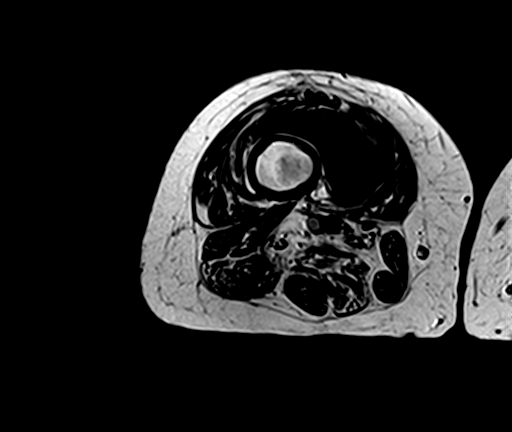
[im 22/70]
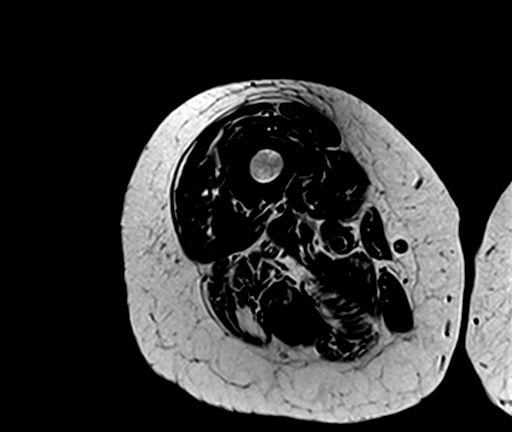
[im 32/70]
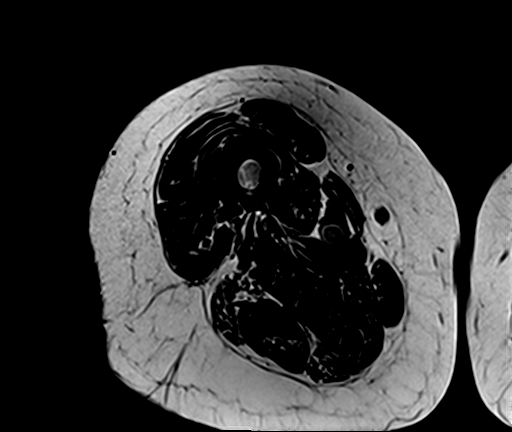
[im 38/70]
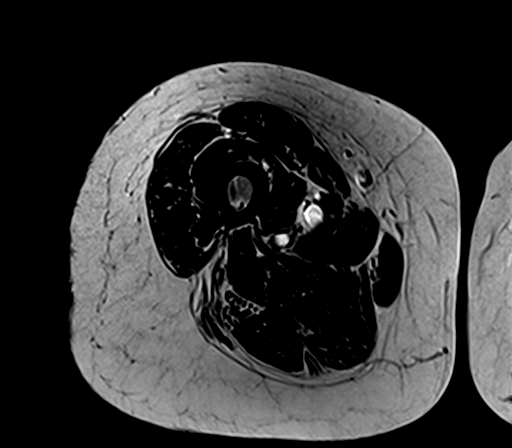
[im 48/70]
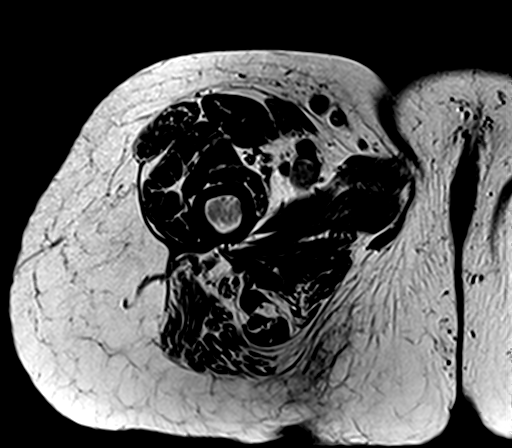
[im 59/70]
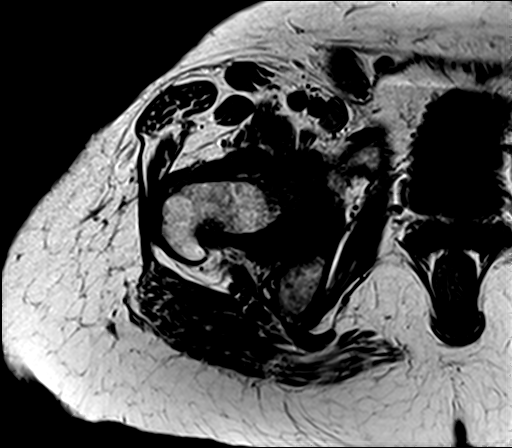
[im 70/70]
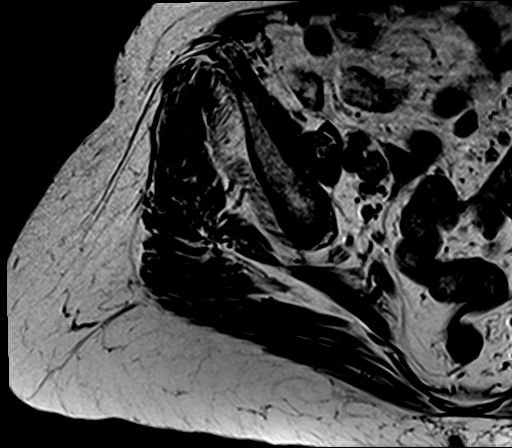

[Series 14: T2 · axial · 5.0mm · 0.43mm/px · z∈[-218,+195]mm · 8 of 70 slices shown]
[im 1/70]
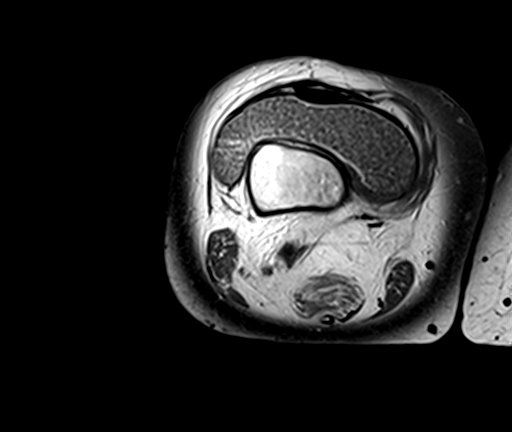
[im 11/70]
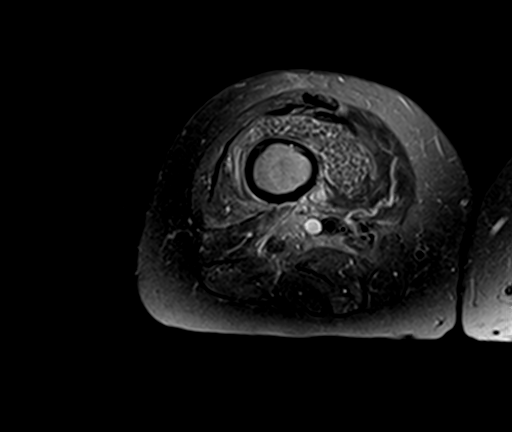
[im 22/70]
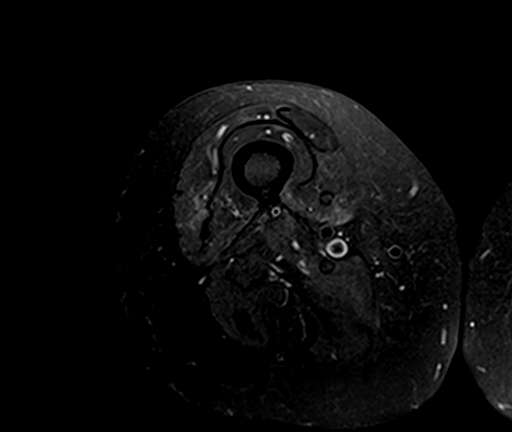
[im 32/70]
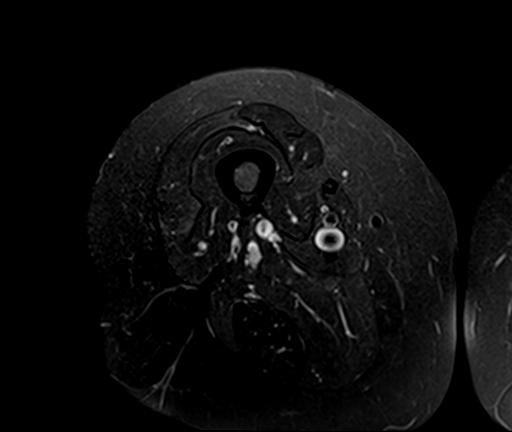
[im 38/70]
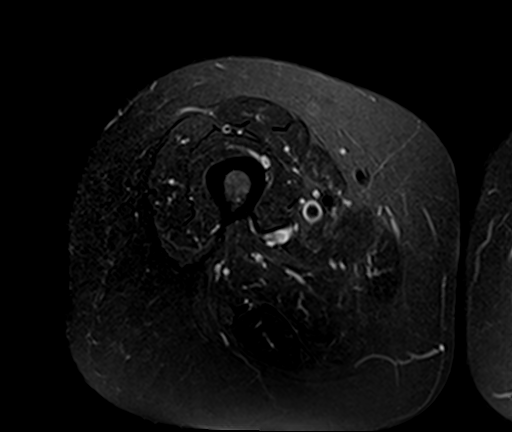
[im 48/70]
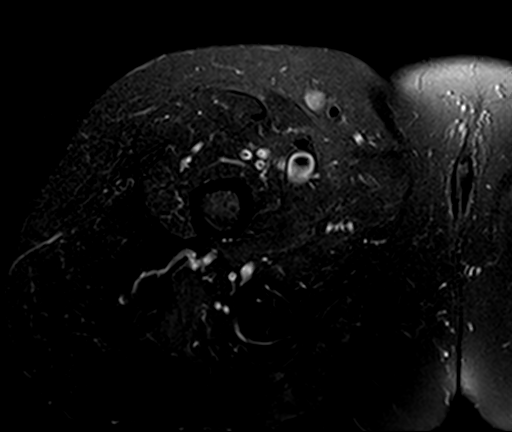
[im 59/70]
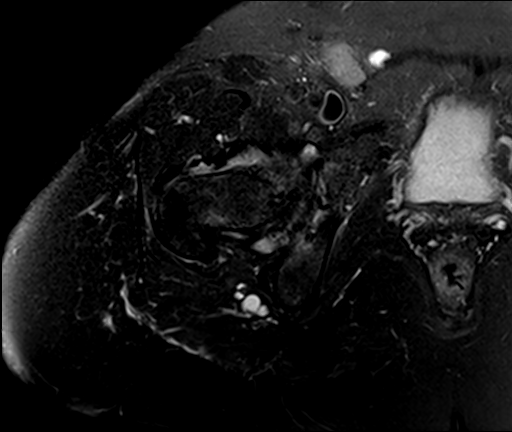
[im 70/70]
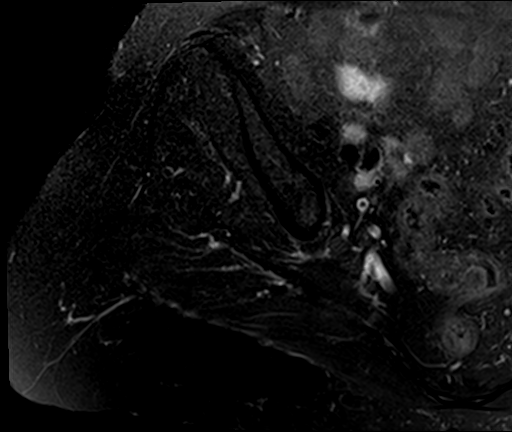

[Series 15: T2 fat-sat · sagittal · 5.0mm · 1.31mm/px · 5 of 25 slices shown]
[im 1/25]
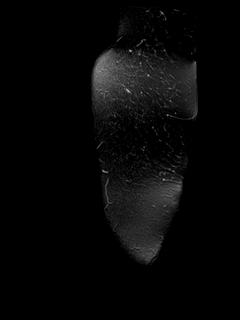
[im 7/25]
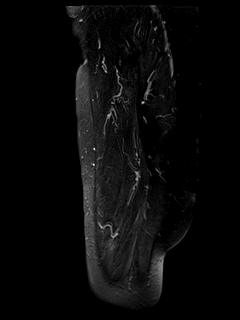
[im 13/25]
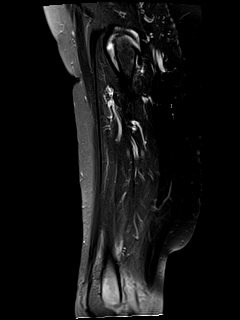
[im 19/25]
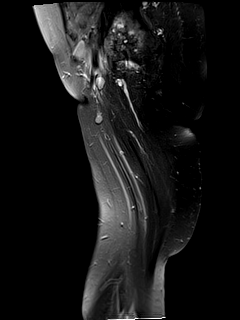
[im 25/25]
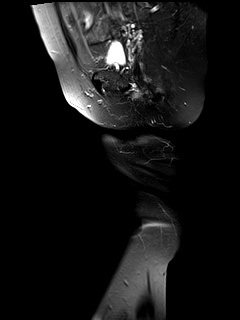

[29 of 40 positions shown; findings below may reference images not displayed]

FINDINGS: Bones/Joint/Cartilage

Severe right hip joint space narrowing with prominent subchondral
cystic change and degenerative marrow edema in the femoral head and
acetabulum. No fracture or dislocation. Normal alignment. Small
right hip joint effusion. No focal bone lesion.

The right knee replacement is not included in the field of view.
There is prominent distension of the suprapatellar joint space which
contains an abundance of T2 and T1 isointense synovial debris.

Muscles and Tendons
No muscle edema. Mild fatty atrophy of the semimembranosus and
biceps femoris muscles. Visualized tendons are intact.

Soft tissue
No fluid collection or hematoma.  No soft tissue mass.

Sigmoid diverticulosis. The visualized intrapelvic structures are
otherwise unremarkable.
IMPRESSION: 1. Severe right hip osteoarthritis.
2. Prominent distention of the right suprapatellar joint space
containing an abundance of synovial debris, suspicious for
polyethylene wear induced synovitis/adverse local tissue reaction
given history of knee arthroplasty.

## 2019-09-15 DIAGNOSIS — M6720 Synovial hypertrophy, not elsewhere classified, unspecified site: Secondary | ICD-10-CM | POA: Diagnosis not present

## 2019-09-15 DIAGNOSIS — Z96651 Presence of right artificial knee joint: Secondary | ICD-10-CM | POA: Diagnosis not present

## 2019-09-15 DIAGNOSIS — M65861 Other synovitis and tenosynovitis, right lower leg: Secondary | ICD-10-CM | POA: Diagnosis not present

## 2019-10-07 ENCOUNTER — Encounter (INDEPENDENT_AMBULATORY_CARE_PROVIDER_SITE_OTHER): Payer: Medicare Other | Admitting: Ophthalmology

## 2019-10-07 ENCOUNTER — Other Ambulatory Visit: Payer: Self-pay

## 2019-10-07 DIAGNOSIS — H353122 Nonexudative age-related macular degeneration, left eye, intermediate dry stage: Secondary | ICD-10-CM

## 2019-10-07 DIAGNOSIS — H43813 Vitreous degeneration, bilateral: Secondary | ICD-10-CM

## 2019-10-07 DIAGNOSIS — I10 Essential (primary) hypertension: Secondary | ICD-10-CM

## 2019-10-07 DIAGNOSIS — H353211 Exudative age-related macular degeneration, right eye, with active choroidal neovascularization: Secondary | ICD-10-CM

## 2019-10-07 DIAGNOSIS — H35033 Hypertensive retinopathy, bilateral: Secondary | ICD-10-CM | POA: Diagnosis not present

## 2019-10-29 DIAGNOSIS — R269 Unspecified abnormalities of gait and mobility: Secondary | ICD-10-CM | POA: Diagnosis not present

## 2019-11-02 DIAGNOSIS — H2512 Age-related nuclear cataract, left eye: Secondary | ICD-10-CM | POA: Diagnosis not present

## 2019-11-05 DIAGNOSIS — H2512 Age-related nuclear cataract, left eye: Secondary | ICD-10-CM | POA: Diagnosis not present

## 2019-11-11 DIAGNOSIS — R269 Unspecified abnormalities of gait and mobility: Secondary | ICD-10-CM | POA: Diagnosis not present

## 2019-11-13 DIAGNOSIS — R269 Unspecified abnormalities of gait and mobility: Secondary | ICD-10-CM | POA: Diagnosis not present

## 2019-11-18 DIAGNOSIS — R269 Unspecified abnormalities of gait and mobility: Secondary | ICD-10-CM | POA: Diagnosis not present

## 2019-11-20 DIAGNOSIS — R269 Unspecified abnormalities of gait and mobility: Secondary | ICD-10-CM | POA: Diagnosis not present

## 2019-11-23 DIAGNOSIS — R269 Unspecified abnormalities of gait and mobility: Secondary | ICD-10-CM | POA: Diagnosis not present

## 2019-11-26 DIAGNOSIS — R269 Unspecified abnormalities of gait and mobility: Secondary | ICD-10-CM | POA: Diagnosis not present

## 2019-12-02 DIAGNOSIS — R269 Unspecified abnormalities of gait and mobility: Secondary | ICD-10-CM | POA: Diagnosis not present

## 2019-12-04 DIAGNOSIS — R269 Unspecified abnormalities of gait and mobility: Secondary | ICD-10-CM | POA: Diagnosis not present

## 2019-12-09 ENCOUNTER — Other Ambulatory Visit: Payer: Self-pay

## 2019-12-09 ENCOUNTER — Encounter (INDEPENDENT_AMBULATORY_CARE_PROVIDER_SITE_OTHER): Payer: Medicare Other | Admitting: Ophthalmology

## 2019-12-09 DIAGNOSIS — I10 Essential (primary) hypertension: Secondary | ICD-10-CM | POA: Diagnosis not present

## 2019-12-09 DIAGNOSIS — H43813 Vitreous degeneration, bilateral: Secondary | ICD-10-CM | POA: Diagnosis not present

## 2019-12-09 DIAGNOSIS — H35033 Hypertensive retinopathy, bilateral: Secondary | ICD-10-CM | POA: Diagnosis not present

## 2019-12-09 DIAGNOSIS — H353110 Nonexudative age-related macular degeneration, right eye, stage unspecified: Secondary | ICD-10-CM

## 2019-12-09 DIAGNOSIS — H353122 Nonexudative age-related macular degeneration, left eye, intermediate dry stage: Secondary | ICD-10-CM | POA: Diagnosis not present

## 2019-12-11 DIAGNOSIS — R269 Unspecified abnormalities of gait and mobility: Secondary | ICD-10-CM | POA: Diagnosis not present

## 2019-12-16 DIAGNOSIS — R269 Unspecified abnormalities of gait and mobility: Secondary | ICD-10-CM | POA: Diagnosis not present

## 2019-12-18 DIAGNOSIS — R269 Unspecified abnormalities of gait and mobility: Secondary | ICD-10-CM | POA: Diagnosis not present

## 2019-12-25 DIAGNOSIS — R269 Unspecified abnormalities of gait and mobility: Secondary | ICD-10-CM | POA: Diagnosis not present

## 2019-12-28 DIAGNOSIS — R269 Unspecified abnormalities of gait and mobility: Secondary | ICD-10-CM | POA: Diagnosis not present

## 2020-01-01 DIAGNOSIS — R269 Unspecified abnormalities of gait and mobility: Secondary | ICD-10-CM | POA: Diagnosis not present

## 2020-01-04 DIAGNOSIS — R269 Unspecified abnormalities of gait and mobility: Secondary | ICD-10-CM | POA: Diagnosis not present

## 2020-01-07 DIAGNOSIS — R269 Unspecified abnormalities of gait and mobility: Secondary | ICD-10-CM | POA: Diagnosis not present

## 2020-01-11 DIAGNOSIS — R269 Unspecified abnormalities of gait and mobility: Secondary | ICD-10-CM | POA: Diagnosis not present

## 2020-01-12 DIAGNOSIS — Z23 Encounter for immunization: Secondary | ICD-10-CM | POA: Diagnosis not present

## 2020-01-14 DIAGNOSIS — R269 Unspecified abnormalities of gait and mobility: Secondary | ICD-10-CM | POA: Diagnosis not present

## 2020-01-18 DIAGNOSIS — R269 Unspecified abnormalities of gait and mobility: Secondary | ICD-10-CM | POA: Diagnosis not present

## 2020-01-29 DIAGNOSIS — R269 Unspecified abnormalities of gait and mobility: Secondary | ICD-10-CM | POA: Diagnosis not present

## 2020-02-05 DIAGNOSIS — R269 Unspecified abnormalities of gait and mobility: Secondary | ICD-10-CM | POA: Diagnosis not present

## 2020-02-08 DIAGNOSIS — I872 Venous insufficiency (chronic) (peripheral): Secondary | ICD-10-CM | POA: Diagnosis not present

## 2020-02-08 DIAGNOSIS — M8949 Other hypertrophic osteoarthropathy, multiple sites: Secondary | ICD-10-CM | POA: Diagnosis not present

## 2020-02-08 DIAGNOSIS — R7303 Prediabetes: Secondary | ICD-10-CM | POA: Diagnosis not present

## 2020-02-08 DIAGNOSIS — I10 Essential (primary) hypertension: Secondary | ICD-10-CM | POA: Diagnosis not present

## 2020-02-08 DIAGNOSIS — R202 Paresthesia of skin: Secondary | ICD-10-CM | POA: Diagnosis not present

## 2020-02-10 ENCOUNTER — Encounter (INDEPENDENT_AMBULATORY_CARE_PROVIDER_SITE_OTHER): Payer: Medicare Other | Admitting: Ophthalmology

## 2020-02-10 ENCOUNTER — Other Ambulatory Visit: Payer: Self-pay

## 2020-02-10 DIAGNOSIS — H43813 Vitreous degeneration, bilateral: Secondary | ICD-10-CM

## 2020-02-10 DIAGNOSIS — H353122 Nonexudative age-related macular degeneration, left eye, intermediate dry stage: Secondary | ICD-10-CM | POA: Diagnosis not present

## 2020-02-10 DIAGNOSIS — I10 Essential (primary) hypertension: Secondary | ICD-10-CM | POA: Diagnosis not present

## 2020-02-10 DIAGNOSIS — H35033 Hypertensive retinopathy, bilateral: Secondary | ICD-10-CM | POA: Diagnosis not present

## 2020-02-10 DIAGNOSIS — H353211 Exudative age-related macular degeneration, right eye, with active choroidal neovascularization: Secondary | ICD-10-CM | POA: Diagnosis not present

## 2020-02-12 DIAGNOSIS — R269 Unspecified abnormalities of gait and mobility: Secondary | ICD-10-CM | POA: Diagnosis not present

## 2020-02-15 DIAGNOSIS — R7303 Prediabetes: Secondary | ICD-10-CM | POA: Diagnosis not present

## 2020-02-15 DIAGNOSIS — I872 Venous insufficiency (chronic) (peripheral): Secondary | ICD-10-CM | POA: Diagnosis not present

## 2020-02-15 DIAGNOSIS — I10 Essential (primary) hypertension: Secondary | ICD-10-CM | POA: Diagnosis not present

## 2020-02-15 DIAGNOSIS — R202 Paresthesia of skin: Secondary | ICD-10-CM | POA: Diagnosis not present

## 2020-02-15 DIAGNOSIS — R5383 Other fatigue: Secondary | ICD-10-CM | POA: Diagnosis not present

## 2020-02-15 DIAGNOSIS — M8949 Other hypertrophic osteoarthropathy, multiple sites: Secondary | ICD-10-CM | POA: Diagnosis not present

## 2020-02-17 DIAGNOSIS — R269 Unspecified abnormalities of gait and mobility: Secondary | ICD-10-CM | POA: Diagnosis not present

## 2020-02-22 DIAGNOSIS — R269 Unspecified abnormalities of gait and mobility: Secondary | ICD-10-CM | POA: Diagnosis not present

## 2020-02-24 DIAGNOSIS — R269 Unspecified abnormalities of gait and mobility: Secondary | ICD-10-CM | POA: Diagnosis not present

## 2020-03-01 DIAGNOSIS — R269 Unspecified abnormalities of gait and mobility: Secondary | ICD-10-CM | POA: Diagnosis not present

## 2020-03-04 DIAGNOSIS — R269 Unspecified abnormalities of gait and mobility: Secondary | ICD-10-CM | POA: Diagnosis not present

## 2020-03-11 DIAGNOSIS — R269 Unspecified abnormalities of gait and mobility: Secondary | ICD-10-CM | POA: Diagnosis not present

## 2020-03-18 DIAGNOSIS — R269 Unspecified abnormalities of gait and mobility: Secondary | ICD-10-CM | POA: Diagnosis not present

## 2020-03-25 DIAGNOSIS — R269 Unspecified abnormalities of gait and mobility: Secondary | ICD-10-CM | POA: Diagnosis not present

## 2020-03-28 DIAGNOSIS — R269 Unspecified abnormalities of gait and mobility: Secondary | ICD-10-CM | POA: Diagnosis not present

## 2020-04-01 DIAGNOSIS — R269 Unspecified abnormalities of gait and mobility: Secondary | ICD-10-CM | POA: Diagnosis not present

## 2020-04-04 DIAGNOSIS — R269 Unspecified abnormalities of gait and mobility: Secondary | ICD-10-CM | POA: Diagnosis not present

## 2020-04-07 DIAGNOSIS — R269 Unspecified abnormalities of gait and mobility: Secondary | ICD-10-CM | POA: Diagnosis not present

## 2020-04-20 ENCOUNTER — Encounter (INDEPENDENT_AMBULATORY_CARE_PROVIDER_SITE_OTHER): Payer: Medicare Other | Admitting: Ophthalmology

## 2020-04-20 ENCOUNTER — Other Ambulatory Visit: Payer: Self-pay

## 2020-04-20 DIAGNOSIS — H35033 Hypertensive retinopathy, bilateral: Secondary | ICD-10-CM | POA: Diagnosis not present

## 2020-04-20 DIAGNOSIS — H353211 Exudative age-related macular degeneration, right eye, with active choroidal neovascularization: Secondary | ICD-10-CM

## 2020-04-20 DIAGNOSIS — H43813 Vitreous degeneration, bilateral: Secondary | ICD-10-CM | POA: Diagnosis not present

## 2020-04-20 DIAGNOSIS — H353122 Nonexudative age-related macular degeneration, left eye, intermediate dry stage: Secondary | ICD-10-CM

## 2020-04-20 DIAGNOSIS — I10 Essential (primary) hypertension: Secondary | ICD-10-CM | POA: Diagnosis not present

## 2020-04-26 DIAGNOSIS — S3991XA Unspecified injury of abdomen, initial encounter: Secondary | ICD-10-CM | POA: Diagnosis not present

## 2020-04-26 DIAGNOSIS — M545 Low back pain, unspecified: Secondary | ICD-10-CM | POA: Diagnosis not present

## 2020-04-26 DIAGNOSIS — Z043 Encounter for examination and observation following other accident: Secondary | ICD-10-CM | POA: Diagnosis not present

## 2020-04-26 DIAGNOSIS — S3993XA Unspecified injury of pelvis, initial encounter: Secondary | ICD-10-CM | POA: Diagnosis not present

## 2020-04-27 ENCOUNTER — Encounter: Payer: Self-pay | Admitting: Neurology

## 2020-04-27 ENCOUNTER — Ambulatory Visit (INDEPENDENT_AMBULATORY_CARE_PROVIDER_SITE_OTHER): Payer: Medicare Other | Admitting: Neurology

## 2020-04-27 VITALS — BP 129/63 | HR 90 | Ht 63.0 in | Wt 138.8 lb

## 2020-04-27 DIAGNOSIS — M6281 Muscle weakness (generalized): Secondary | ICD-10-CM

## 2020-04-27 DIAGNOSIS — R269 Unspecified abnormalities of gait and mobility: Secondary | ICD-10-CM

## 2020-04-27 DIAGNOSIS — E538 Deficiency of other specified B group vitamins: Secondary | ICD-10-CM | POA: Diagnosis not present

## 2020-04-27 NOTE — Progress Notes (Signed)
Reason for visit: Gait disorder, weakness  Referring physician: [name] is a 86 y.o. female  History of present illness:  [name] is an 86 year old right-handed white female with a history of degenerative arthritis affecting the right knee and right hip.  The patient had her initial knee surgery 6 years ago, and had revision of the surgery 2 years ago.  Over the last 2 years she has had some progressive worsening of her ability to ambulate.  She had a total hip replacement as well previously on the right, but she has not had any improvement in her ability to walk.  She has undergone physical therapy over the last 5 months without improvement of her walking, she is slowly worsening with her ability to ambulate.  She has difficulty getting up from a seated position, she feels weak in the legs on both sides, right equal to left.  She reports no numbness of the extremities.  She denies any neck or low back pain.  She denies issues controlling the bowels or the bladder.  She reports no headaches or dizziness.  She is noted to be shuffling her feet somewhat, she is using a walker for ambulation currently.  She last fell about 2 weeks ago and she has some pain in her low back following this.  She has noted some tremor but this is present when she is trying to feed herself usually, not at other times, she does not have a resting tremor.  She currently resides at [facility]  She has a history of prediabetes, but her most recent hemoglobin A1c was 5.2.  She may have some slight mild memory issues, but not severe.  She comes here for further evaluation.  Past Medical History:  Diagnosis Date  . Arthritis   . Hypertension     Past Surgical History:  Procedure Laterality Date  . ABDOMINAL HYSTERECTOMY    . COLONOSCOPY    . HEMANGIOMA EXCISION    . JOINT REPLACEMENT    . TONSILLECTOMY    . TOTAL HIP ARTHROPLASTY Right 02/18/2019   Procedure: TOTAL HIP  ARTHROPLASTY ANTERIOR APPROACH;  Surgeon: Gaynelle Arabian, MD;  Location: [facility];  Service: Orthopedics;  Laterality: Right;  117min  . TOTAL KNEE ARTHROPLASTY Right 05/27/2012   Procedure: TOTAL KNEE ARTHROPLASTY;  Surgeon: Meredith Pel, MD;  Location: [place];  Service: Orthopedics;  Laterality: Right;  Right Total Knee Arthroplasty  . TOTAL KNEE REVISION Right 08/06/2018   Procedure: TOTAL KNEE REVISION;  Surgeon: Gaynelle Arabian, MD;  Location: [facility];  Service: Orthopedics;  Laterality: Right;  . TUBAL LIGATION      Family History  Problem Relation Age of Onset  . Cancer Mother   . Heart disease Father   . Cancer Brother   . Asthma Brother     Social history:  reports that she quit smoking about 23 years ago. Her smoking use included cigarettes. She has never used smokeless tobacco. She reports current alcohol use of about 1.0 standard drink of alcohol per week. She reports that she does not use drugs.  Medications:  Prior to Admission medications   Medication Sig Start Date End Date Taking? Authorizing Provider  acetaminophen (TYLENOL) 325 MG tablet Take 650 mg by mouth every 6 (six) hours as needed for moderate pain.   Yes [provider]  amLODipine (NORVASC) 5 MG tablet Take 5 mg by mouth daily.  02/06/16  Yes [provider]  cholecalciferol (VITAMIN  D3) 25 MCG (1000 UT) tablet Take 1,000 Units by mouth daily.   Yes [provider]  COMBIGAN 0.2-0.5 % ophthalmic solution Place 1 drop into the right eye daily.  01/24/18  Yes [provider]  dorzolamide (TRUSOPT) 2 % ophthalmic solution Place 1 drop into the right eye 2 (two) times daily.  01/03/18  Yes [provider]  HYDROcodone-acetaminophen (NORCO/VICODIN) 5-325 MG tablet Take 1-2 tablets by mouth every 6 (six) hours as needed for severe pain. 02/19/19  Yes Edmisten, Kristie L, PA  latanoprost (XALATAN) 0.005 % ophthalmic solution Place 1 drop into both eyes at bedtime.  01/24/18   Yes [provider]  methocarbamol (ROBAXIN) 500 MG tablet Take 1 tablet (500 mg total) by mouth every 6 (six) hours as needed for muscle spasms. 02/19/19  Yes Edmisten, Kristie L, PA  Multiple Vitamins-Minerals (PRESERVISION AREDS 2 PO) Take 2 capsules by mouth daily.   Yes [provider]  olmesartan-hydrochlorothiazide (BENICAR HCT) 40-25 MG tablet Take 1 tablet by mouth daily.   Yes [provider]     No Known Allergies  ROS:  Out of a complete 14 system review of symptoms, the patient complains only of the following symptoms, and all other reviewed systems are negative.  Walking difficulty Weakness Recent back pain following fall  Blood pressure 129/63, pulse 90, height 5\' 3"  (1.6 m), weight 138 lb 12.8 oz (63 kg).  Physical Exam  General: The patient is alert and cooperative at the time of the examination.  Eyes: Pupils are equal, round, and reactive to light. Discs are flat bilaterally.  Neck: The neck is supple, no carotid bruits are noted.  Respiratory: The respiratory examination is clear.  Cardiovascular: The cardiovascular examination reveals a regular rate and rhythm, no obvious murmurs or rubs are noted.  Skin: Extremities are without significant edema.  Neurologic Exam  Mental status: The patient is alert and oriented x 3 at the time of the examination. The patient has apparent normal recent and remote memory, with an apparently normal attention span and concentration ability.  Cranial nerves: Facial symmetry is present. There is good sensation of the face to pinprick and soft touch bilaterally. The strength of the facial muscles and the muscles to head turning and shoulder shrug are normal bilaterally. Speech is well enunciated, no aphasia or dysarthria is noted. Extraocular movements are full. Visual fields are full. The tongue is midline, and the patient has symmetric elevation of the soft palate. No obvious hearing deficits are  noted.  Motor: The motor testing reveals slight weakness throughout the upper extremities to include the intrinsic muscles, biceps and triceps, and deltoid muscles.  There is slight weakness with neck flexion, not with extension.  With the lower extremities, there is 4/5 strength with hip flexion bilaterally and 4+/5 strength with adduction and abduction of the thighs bilaterally.  Distal strength of the legs is full and normal.  Good symmetric motor tone is noted throughout.  Sensory: Sensory testing is intact to pinprick, soft touch, vibration sensation, and position sense on all 4 extremities. No evidence of extinction is noted.  Coordination: Cerebellar testing reveals good finger-nose-finger and heel-to-shin bilaterally.  Gait and station: Gait is slightly wide-based.  The patient walks with a walker, she can only walk a short distance without the walker.  Tandem gait was not attempted.  Romberg is negative but is slightly unsteady.  Reflexes: Deep tendon reflexes are symmetric, but are slightly depressed bilaterally. Toes are equivocal bilaterally.  Assessment/Plan:  1.  Progressive gait disorder, generalized muscle weakness  The patient appears to have proximal muscle weakness in the legs and mild generalized weakness symmetrically in the arms to include weakness with neck flexion.  This could be consistent with a mild myopathic disorder.  Blood work will be done today.  We will check MRI of the brain to exclude small vessel disease and check MRI of the cervical spine to exclude spinal stenosis as an etiology for gait problem.  If the above studies are unremarkable, we will set her up for EMG and nerve conduction study evaluating an arm and a leg.  Otherwise, she will follow up in 4 months.  Jill Alexanders MD 04/27/2020 3:17 PM  [facility] [address] [place], [place] [address]  Phone [phone] Fax [phone]

## 2020-04-28 ENCOUNTER — Telehealth: Payer: Self-pay | Admitting: Neurology

## 2020-04-28 NOTE — Telephone Encounter (Signed)
Medicare/BCBS auth: [employer] spoke to Senaida Ores Ref # [id number] order sent to GI. They will reach out to the patient to schedule.

## 2020-05-06 LAB — ENA+DNA/DS+SJORGEN'S
ENA RNP Ab: 0.2 AI (ref 0.0–0.9)
ENA SM Ab Ser-aCnc: <0.2 AI (ref 0.0–0.9)
ENA SSA (RO) Ab: <0.2 AI (ref 0.0–0.9)
ENA SSB (LA) Ab: 1.8 AI — ABNORMAL HIGH (ref 0.0–0.9)
dsDNA Ab: <1 IU/mL (ref 0–9)

## 2020-05-06 LAB — SEDIMENTATION RATE: Sed Rate: 22 mm/hr (ref 0–40)

## 2020-05-06 LAB — ANA W/REFLEX: Anti Nuclear Antibody (ANA): POSITIVE — AB

## 2020-05-06 LAB — ACETYLCHOLINE RECEPTOR, BINDING: AChR Binding Ab, Serum: <0.03 nmol/L (ref 0.00–0.24)

## 2020-05-06 LAB — RPR: RPR Ser Ql: NONREACTIVE

## 2020-05-06 LAB — VITAMIN B12: Vitamin B-12: 254 pg/mL (ref 232–1245)

## 2020-05-06 LAB — VGCC ANTIBODY: VGCC Antibody: NEGATIVE

## 2020-05-06 LAB — CK: Total CK: 25 U/L — ABNORMAL LOW (ref 26–161)

## 2020-05-06 LAB — COPPER, SERUM: Copper: 161 ug/dL — ABNORMAL HIGH (ref 80–158)

## 2020-05-09 DIAGNOSIS — H401131 Primary open-angle glaucoma, bilateral, mild stage: Secondary | ICD-10-CM | POA: Diagnosis not present

## 2020-05-09 DIAGNOSIS — Z961 Presence of intraocular lens: Secondary | ICD-10-CM | POA: Diagnosis not present

## 2020-05-09 DIAGNOSIS — H353121 Nonexudative age-related macular degeneration, left eye, early dry stage: Secondary | ICD-10-CM | POA: Diagnosis not present

## 2020-05-18 ENCOUNTER — Ambulatory Visit
Admission: RE | Admit: 2020-05-18 | Discharge: 2020-05-18 | Disposition: A | Discharge status: Home / Self Care | Payer: Medicare Other | Source: Ambulatory Visit | Attending: Neurology | Admitting: Neurology

## 2020-05-18 DIAGNOSIS — R269 Unspecified abnormalities of gait and mobility: Secondary | ICD-10-CM

## 2020-05-18 DIAGNOSIS — M6281 Muscle weakness (generalized): Secondary | ICD-10-CM

## 2020-05-20 ENCOUNTER — Telehealth: Payer: Self-pay | Admitting: Neurology

## 2020-05-20 DIAGNOSIS — R531 Weakness: Secondary | ICD-10-CM

## 2020-05-20 NOTE — Telephone Encounter (Signed)
I called the patient.  The MRI of the brain and cervical spinal cord did not fully explain the generalized weakness.  There is at least a moderate level spinal stenosis at C5-6 level, but no evidence of injury to the spinal cord.  As discussed previously, I will get the patient set up for nerve conduction study of one arm and 1 leg, EMG study of one arm and 1 leg looking for evidence of a myopathic disorder.   MRI brain 05/18/20:  IMPRESSION: Unremarkable MRI scan of the brain without contrast showing only mild age-related changes of small vessel disease and generalized cerebral atrophy.  Overall no significant change compared with previous MRI from 2007  except expected progression of cerebral atrophy.    MRI cervical 05/18/20:  IMPRESSION: Abnormal MRI scan cervical spine without contrast showing prominent spondylitic changes at C5-6 where there is broad-based disc osteophyte protrusion and ligamentum flavum hypertrophy resulting in moderate canal and severe left-sided foraminal narrowing.  C4-5 also shows mild canal and bilateral foraminal narrowing.

## 2020-06-06 ENCOUNTER — Encounter: Payer: Medicare Other | Admitting: Neurology

## 2020-06-06 DIAGNOSIS — Z23 Encounter for immunization: Secondary | ICD-10-CM | POA: Diagnosis not present

## 2020-06-10 DIAGNOSIS — M1712 Unilateral primary osteoarthritis, left knee: Secondary | ICD-10-CM | POA: Diagnosis not present

## 2020-06-10 DIAGNOSIS — M170 Bilateral primary osteoarthritis of knee: Secondary | ICD-10-CM | POA: Diagnosis not present

## 2020-06-20 ENCOUNTER — Ambulatory Visit (INDEPENDENT_AMBULATORY_CARE_PROVIDER_SITE_OTHER): Payer: Medicare Other | Admitting: Neurology

## 2020-06-20 ENCOUNTER — Encounter: Payer: Self-pay | Admitting: Neurology

## 2020-06-20 DIAGNOSIS — R531 Weakness: Secondary | ICD-10-CM | POA: Diagnosis not present

## 2020-06-20 DIAGNOSIS — R269 Unspecified abnormalities of gait and mobility: Secondary | ICD-10-CM

## 2020-06-20 HISTORY — DX: Unspecified abnormalities of gait and mobility: R26.9

## 2020-06-20 NOTE — Progress Notes (Signed)
Please refer to EMG and nerve conduction procedure note.  

## 2020-06-20 NOTE — Procedures (Signed)
     HISTORY:  Amy Carroll is an 86 year old patient with a history of weakness of the arms and legs, she has a balance issue and is at risk for falls, she uses a walker for ambulation.  She is being evaluated for the generalized weakness.  NERVE CONDUCTION STUDIES:  Nerve conduction studies were performed on the left upper extremity. The distal motor latencies and motor amplitudes for the  median and ulnar nerves were within normal limits. The nerve conduction velocities for these nerves were also normal. The sensory latency for the median nerve was normal, but the left ulnar sensory latency was slightly prolonged. The F wave latency for the ulnar nerve was within normal limits.  Nerve conduction studies were performed on the left lower extremity.  The distal motor latencies for the peroneal and posterior tibial nerves were normal with low motor amplitudes seen for these nerves.  The nerve conduction velocities for the left peroneal and posterior tibial nerves were normal.  The left sural and peroneal sensory latencies were normal.  The F-wave latency for the left posterior tibial nerve was normal.  EMG STUDIES:  EMG study was performed on the left upper extremity:  The first dorsal interosseous muscle reveals 2 to 4 K units with full recruitment. No fibrillations or positive waves were noted. The abductor pollicis brevis muscle reveals 2 to 4 K units with full recruitment. No fibrillations or positive waves were noted. The extensor indicis proprius muscle reveals 1 to 3 K units with full recruitment. No fibrillations or positive waves were noted. The biceps muscle reveals 1 to 2 K units with full recruitment. No fibrillations or positive waves were noted. The triceps muscle reveals 2 to 3 K units with full recruitment. No fibrillations or positive waves were noted. The anterior deltoid muscle reveals 1 to 2 K units with full recruitment. No fibrillations or positive waves were  noted. The cervical paraspinal muscles were tested at 2 levels. No abnormalities of insertional activity were seen at either level tested. There was good relaxation.  EMG study was performed on the left lower extremity:  The tibialis anterior muscle reveals 2 to 4K motor units with full recruitment. No fibrillations or positive waves were seen. The peroneus tertius muscle reveals 2 to 4K motor units with full recruitment. No fibrillations or positive waves were seen. The medial gastrocnemius muscle reveals 1 to 3K motor units with full recruitment. No fibrillations or positive waves were seen. The vastus lateralis muscle reveals 2 to 4K motor units with full recruitment. No fibrillations or positive waves were seen. The iliopsoas muscle reveals 2 to 4K motor units with full recruitment. No fibrillations or positive waves were seen. The biceps femoris muscle (long head) reveals 1 to 3K motor units with full recruitment. No fibrillations or positive waves were seen. The lumbosacral paraspinal muscles were tested at 3 levels, and revealed no abnormalities of insertional activity at all 3 levels tested. There was good relaxation.   IMPRESSION:  Nerve conduction studies done on the left upper and left lower extremities do not show evidence of a generalized neuropathy.  EMG of the left upper and left lower extremities do not show evidence of neuropathic or myopathic denervation, no evidence of an myopathy was noted.  Jill Alexanders MD 06/20/2020 5:03 PM  [facility] [address] [place], [place] [address]  Phone [phone] Fax [phone]

## 2020-06-20 NOTE — Progress Notes (Addendum)
The patient comes in for EMG and nerve conduction study evaluation.  No clear evidence of a myopathic disorder was seen.  The patient will engage in muscle toning exercises.  She has significant arthritis of the left knee and may require surgery for this in the future.  Within the last 2 to 3 days, she has developed some low back pain on the right going into the hip.  She will take an anti-inflammatory for this, if this does not abate we may consider getting an x-ray of the low back.   [place]    Nerve / Sites Muscle Latency Ref. Amplitude Ref. Rel Amp Segments Distance Velocity Ref. Area    ms ms mV mV %  cm m/s m/s mVms  L Median - APB     Wrist APB 3.8 ?4.4 5.3 ?4.0 100 Wrist - APB 7   19.5     Upper arm APB 7.7  4.8  90.2 Upper arm - Wrist 20 51 ?49 19.2  L Ulnar - ADM     Wrist ADM 2.9 ?3.3 6.4 ?6.0 100 Wrist - ADM 7   28.1     B.Elbow ADM 6.4  5.8  90.8 B.Elbow - Wrist 19 54 ?49 27.7     A.Elbow ADM 8.3  5.5  95.6 A.Elbow - B.Elbow 10 53 ?49 26.8  L Peroneal - EDB     Ankle EDB 4.2 ?6.5 1.1 ?2.0 100 Ankle - EDB 9   4.1     Fib head EDB 10.0  1.0  85.8 Fib head - Ankle 26 45 ?44 3.3     Pop fossa EDB 12.3  0.9  94.2 Pop fossa - Fib head 10 44 ?44 3.3         Pop fossa - Ankle      L Tibial - AH     Ankle AH 4.0 ?5.8 1.7 ?4.0 100 Ankle - AH 9   5.6     Pop fossa AH 12.9  1.5  84.6 Pop fossa - Ankle 37 41 ?41 5.0             SNC    Nerve / Sites Rec. Site Peak Lat Ref.  Amp Ref. Segments Distance    ms ms V V  cm  L Sural - Ankle (Calf)     Calf Ankle 3.8 ?4.4 4 ?6 Calf - Ankle 14  L Superficial peroneal - Ankle     Lat leg Ankle 3.6 ?4.4 3 ?6 Lat leg - Ankle 14  L Median - Orthodromic (Dig II, Mid palm)     Dig II Wrist 3.1 ?3.4 11 ?10 Dig II - Wrist 13  L Ulnar - Orthodromic, (Dig V, Mid palm)     Dig V Wrist 3.6 ?3.1 3 ?5 Dig V - Wrist 56             F  Wave    Nerve F Lat Ref.   ms ms  L Tibial - AH 55.8 ?56.0  L Ulnar - ADM 29.2 ?32.0

## 2020-06-29 ENCOUNTER — Other Ambulatory Visit: Payer: Self-pay

## 2020-06-29 ENCOUNTER — Encounter (INDEPENDENT_AMBULATORY_CARE_PROVIDER_SITE_OTHER): Payer: Medicare Other | Admitting: Ophthalmology

## 2020-06-29 DIAGNOSIS — H43813 Vitreous degeneration, bilateral: Secondary | ICD-10-CM

## 2020-06-29 DIAGNOSIS — H353122 Nonexudative age-related macular degeneration, left eye, intermediate dry stage: Secondary | ICD-10-CM | POA: Diagnosis not present

## 2020-06-29 DIAGNOSIS — H353211 Exudative age-related macular degeneration, right eye, with active choroidal neovascularization: Secondary | ICD-10-CM | POA: Diagnosis not present

## 2020-06-29 DIAGNOSIS — I10 Essential (primary) hypertension: Secondary | ICD-10-CM | POA: Diagnosis not present

## 2020-06-29 DIAGNOSIS — H35033 Hypertensive retinopathy, bilateral: Secondary | ICD-10-CM

## 2020-07-21 DIAGNOSIS — R7303 Prediabetes: Secondary | ICD-10-CM | POA: Diagnosis not present

## 2020-07-21 DIAGNOSIS — Z0000 Encounter for general adult medical examination without abnormal findings: Secondary | ICD-10-CM | POA: Diagnosis not present

## 2020-07-21 DIAGNOSIS — R5383 Other fatigue: Secondary | ICD-10-CM | POA: Diagnosis not present

## 2020-07-21 DIAGNOSIS — I10 Essential (primary) hypertension: Secondary | ICD-10-CM | POA: Diagnosis not present

## 2020-07-28 DIAGNOSIS — R5383 Other fatigue: Secondary | ICD-10-CM | POA: Diagnosis not present

## 2020-07-28 DIAGNOSIS — I10 Essential (primary) hypertension: Secondary | ICD-10-CM | POA: Diagnosis not present

## 2020-07-28 DIAGNOSIS — M8949 Other hypertrophic osteoarthropathy, multiple sites: Secondary | ICD-10-CM | POA: Diagnosis not present

## 2020-07-28 DIAGNOSIS — I872 Venous insufficiency (chronic) (peripheral): Secondary | ICD-10-CM | POA: Diagnosis not present

## 2020-07-28 DIAGNOSIS — R202 Paresthesia of skin: Secondary | ICD-10-CM | POA: Diagnosis not present

## 2020-07-28 DIAGNOSIS — R7303 Prediabetes: Secondary | ICD-10-CM | POA: Diagnosis not present

## 2020-08-11 DIAGNOSIS — M1711 Unilateral primary osteoarthritis, right knee: Secondary | ICD-10-CM | POA: Diagnosis not present

## 2020-08-18 DIAGNOSIS — M179 Osteoarthritis of knee, unspecified: Secondary | ICD-10-CM | POA: Diagnosis not present

## 2020-08-18 DIAGNOSIS — M1712 Unilateral primary osteoarthritis, left knee: Secondary | ICD-10-CM | POA: Diagnosis not present

## 2020-08-26 DIAGNOSIS — M1712 Unilateral primary osteoarthritis, left knee: Secondary | ICD-10-CM | POA: Diagnosis not present

## 2020-09-01 ENCOUNTER — Ambulatory Visit: Payer: Medicare Other | Admitting: Neurology

## 2020-09-14 ENCOUNTER — Encounter (INDEPENDENT_AMBULATORY_CARE_PROVIDER_SITE_OTHER): Payer: Medicare Other | Admitting: Ophthalmology

## 2020-09-14 ENCOUNTER — Other Ambulatory Visit: Payer: Self-pay

## 2020-09-14 DIAGNOSIS — H35033 Hypertensive retinopathy, bilateral: Secondary | ICD-10-CM | POA: Diagnosis not present

## 2020-09-14 DIAGNOSIS — H353122 Nonexudative age-related macular degeneration, left eye, intermediate dry stage: Secondary | ICD-10-CM

## 2020-09-14 DIAGNOSIS — H353211 Exudative age-related macular degeneration, right eye, with active choroidal neovascularization: Secondary | ICD-10-CM

## 2020-09-14 DIAGNOSIS — I10 Essential (primary) hypertension: Secondary | ICD-10-CM | POA: Diagnosis not present

## 2020-09-14 DIAGNOSIS — H43813 Vitreous degeneration, bilateral: Secondary | ICD-10-CM

## 2020-10-13 DIAGNOSIS — M1712 Unilateral primary osteoarthritis, left knee: Secondary | ICD-10-CM | POA: Diagnosis not present

## 2020-10-21 DIAGNOSIS — Z01818 Encounter for other preprocedural examination: Secondary | ICD-10-CM | POA: Diagnosis not present

## 2020-10-21 DIAGNOSIS — R5383 Other fatigue: Secondary | ICD-10-CM | POA: Diagnosis not present

## 2020-10-21 DIAGNOSIS — R7303 Prediabetes: Secondary | ICD-10-CM | POA: Diagnosis not present

## 2020-10-21 DIAGNOSIS — I10 Essential (primary) hypertension: Secondary | ICD-10-CM | POA: Diagnosis not present

## 2020-10-25 DIAGNOSIS — E876 Hypokalemia: Secondary | ICD-10-CM | POA: Diagnosis not present

## 2020-10-25 DIAGNOSIS — D649 Anemia, unspecified: Secondary | ICD-10-CM | POA: Diagnosis not present

## 2020-10-25 DIAGNOSIS — R7303 Prediabetes: Secondary | ICD-10-CM | POA: Diagnosis not present

## 2020-10-25 DIAGNOSIS — Z01818 Encounter for other preprocedural examination: Secondary | ICD-10-CM | POA: Diagnosis not present

## 2020-11-09 DIAGNOSIS — E538 Deficiency of other specified B group vitamins: Secondary | ICD-10-CM | POA: Diagnosis not present

## 2020-11-09 DIAGNOSIS — E611 Iron deficiency: Secondary | ICD-10-CM | POA: Diagnosis not present

## 2020-11-17 IMAGING — RF DG HIP (WITH PELVIS) OPERATIVE*R*
1 series · 3 of 3 positions shown · non-contrast
Comparison: None.

CLINICAL DATA: RIGHT anterior hip arthroplasty.

EXAM:
OPERATIVE RIGHT HIP (WITH PELVIS IF PERFORMED) 3 VIEWS
TECHNIQUE: Fluoroscopic spot image(s) were submitted for interpretation
post-operatively.

[Series 1: unknown protocol · 0.20mm/px · 3 of 3 slices shown]
[im 1/3]
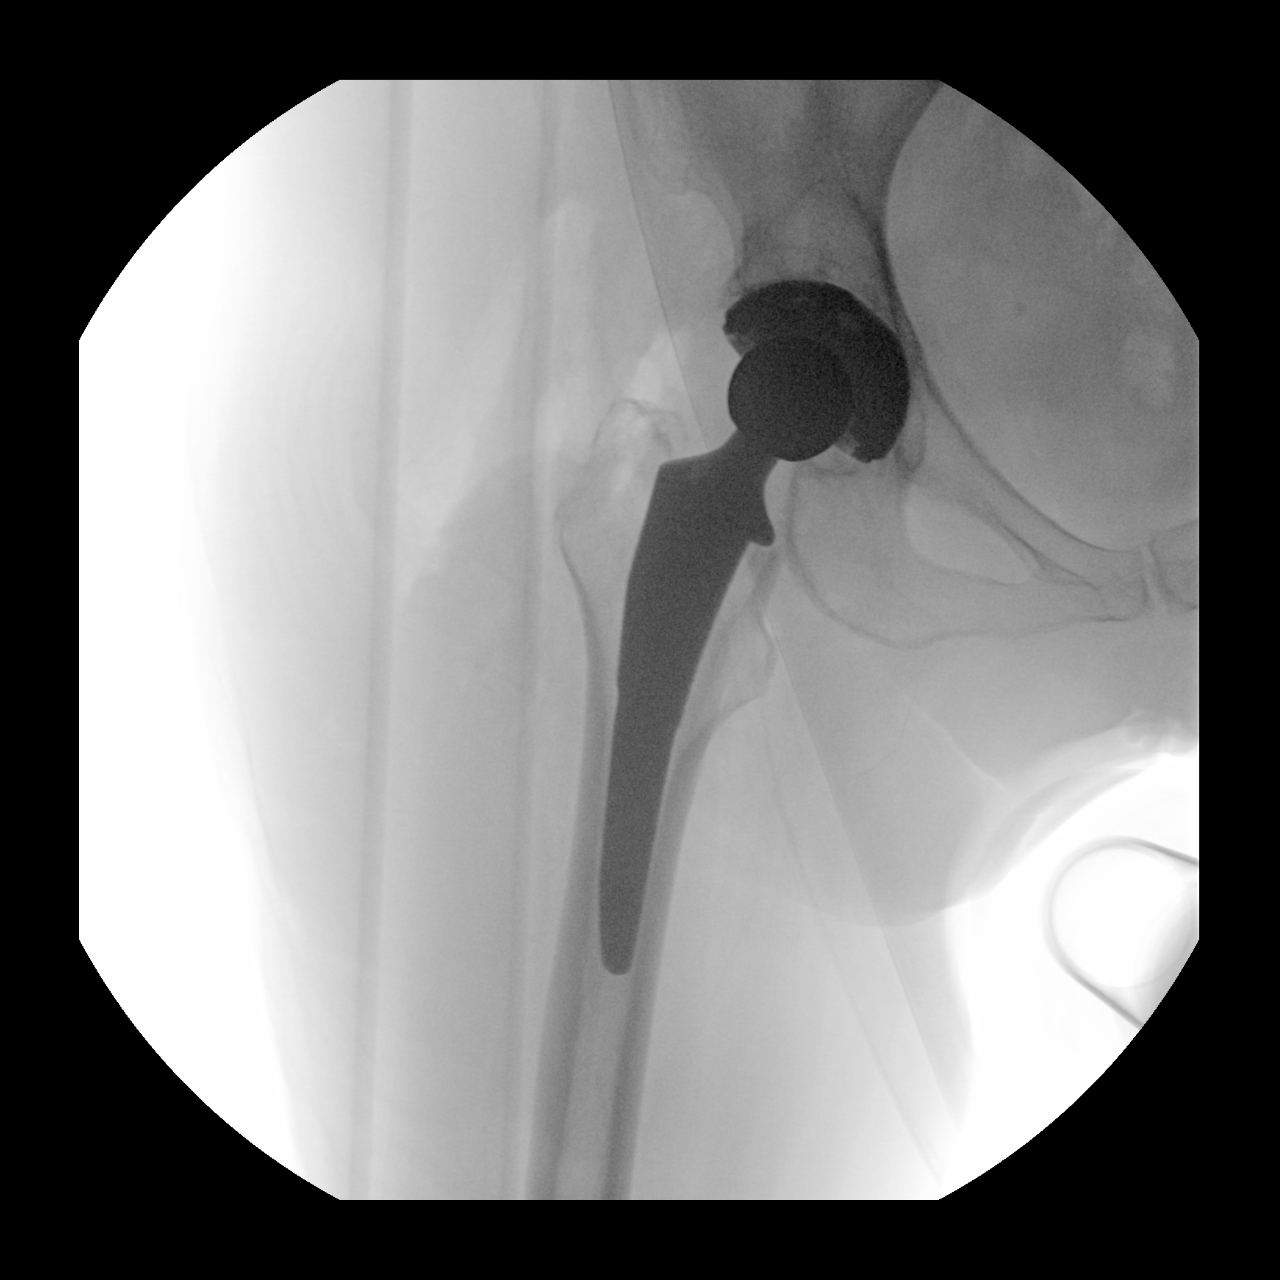
[im 2/3]
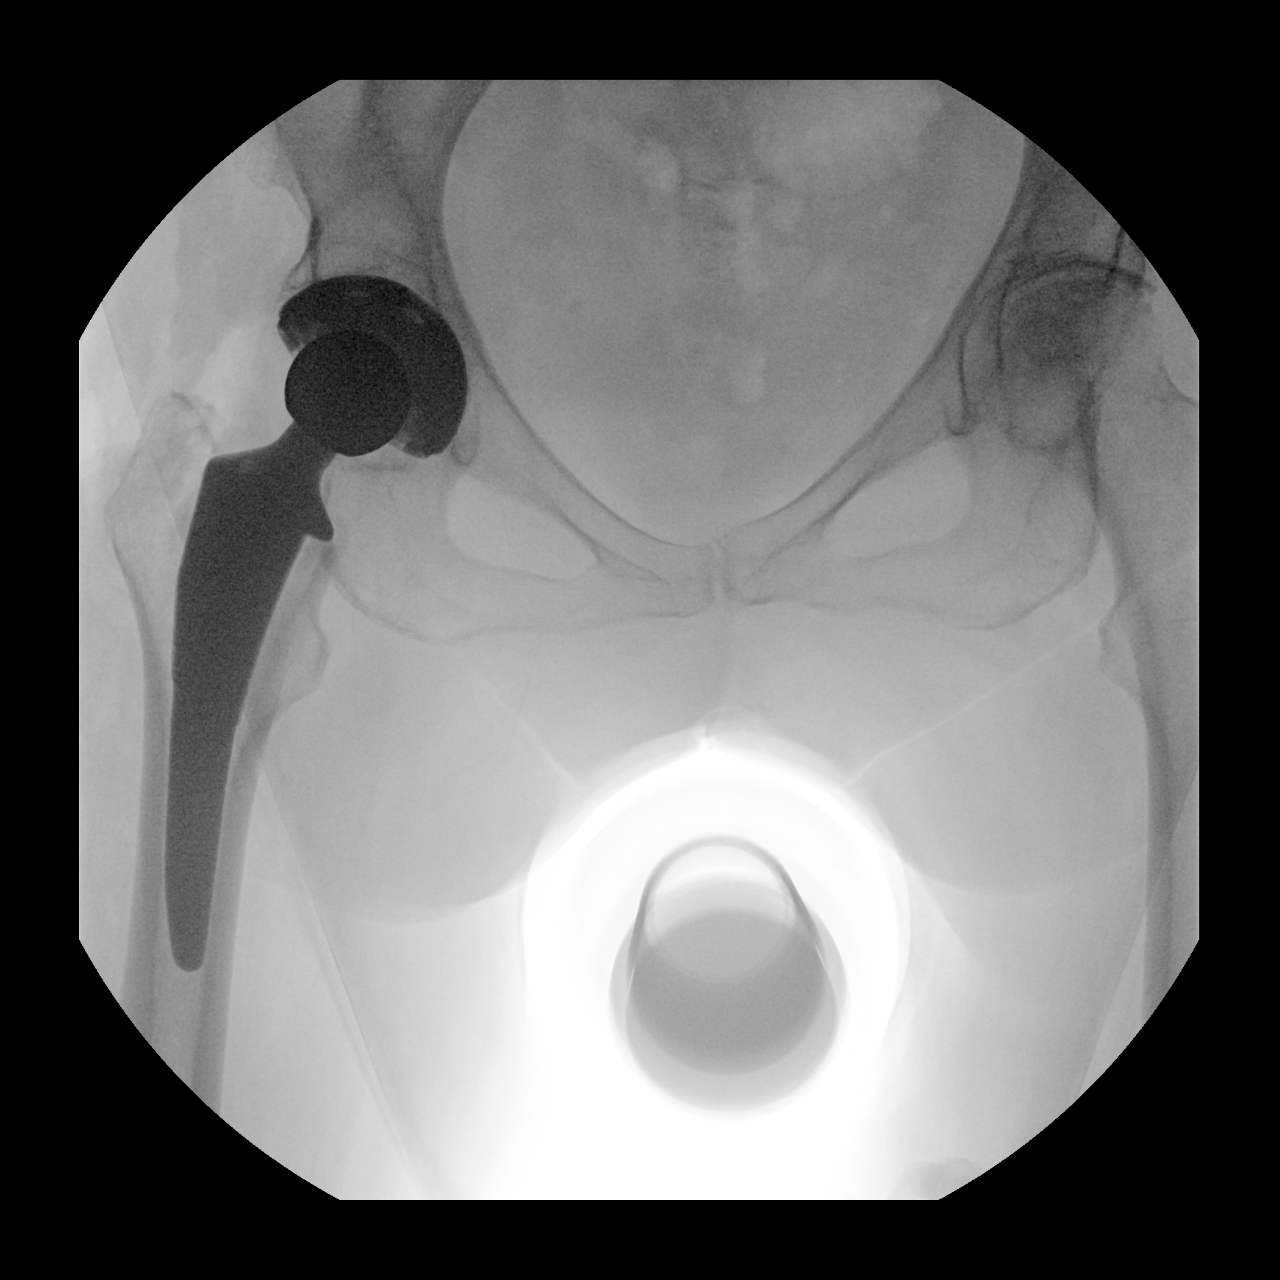
[im 3/3]
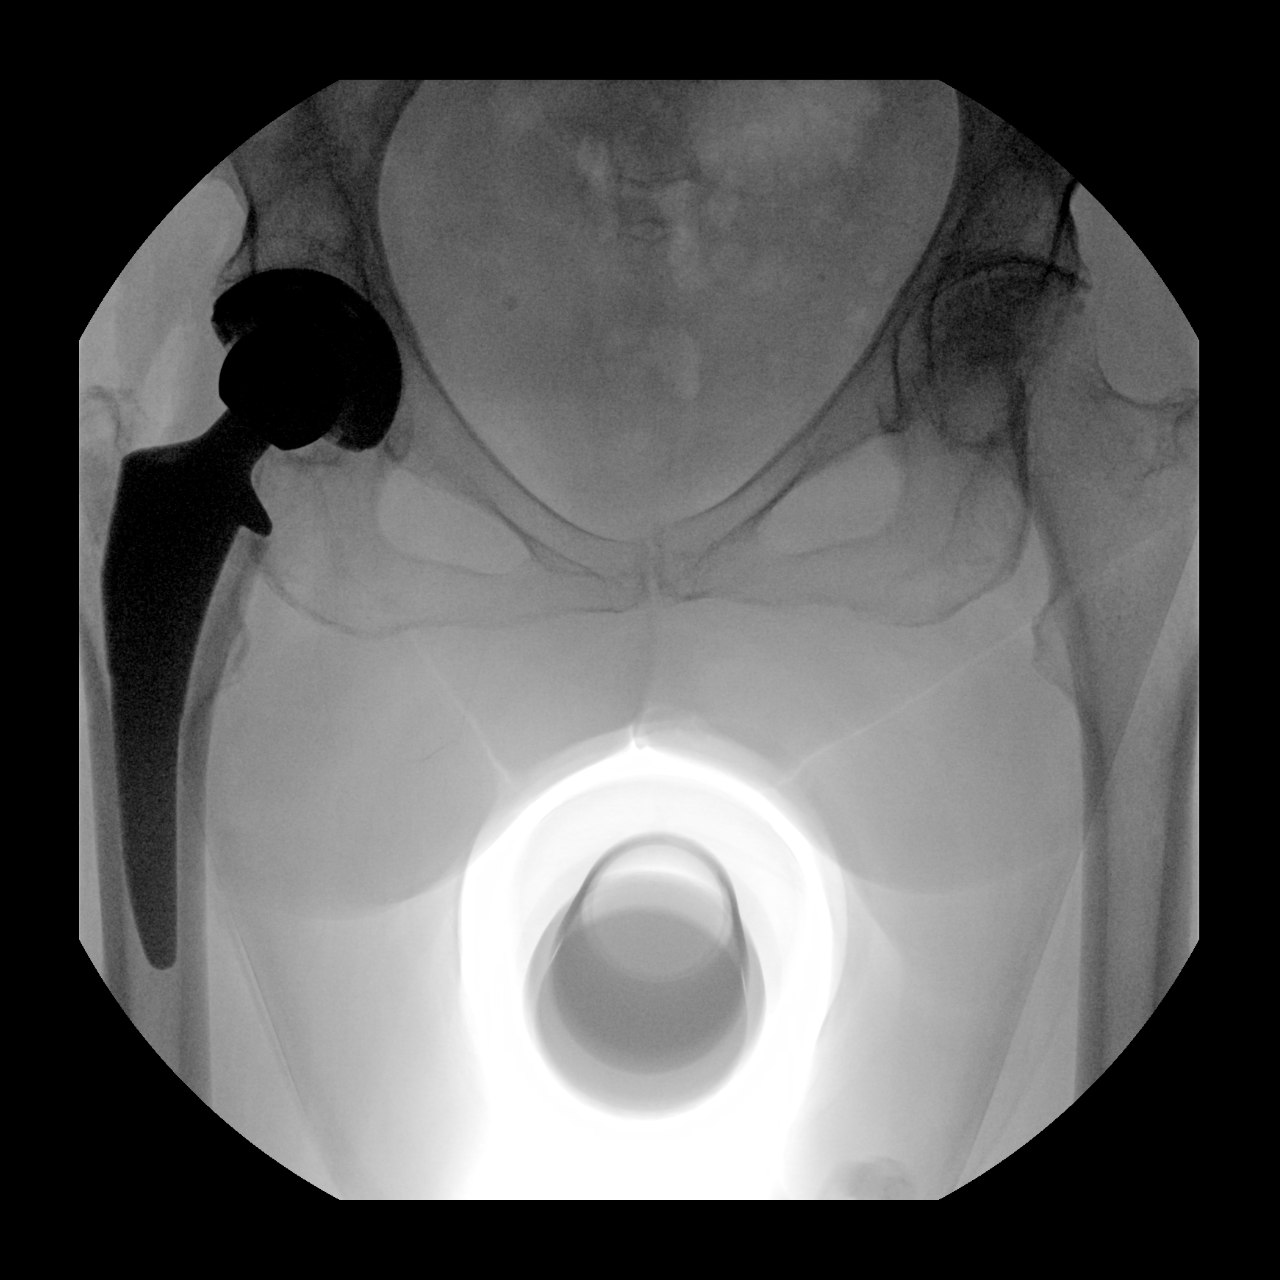

[3 of 3 positions shown; findings below may reference images not displayed]

FINDINGS: Intraoperative fluoroscopic spot images demonstrating RIGHT hip
arthroplasty hardware. Hardware appears appropriately positioned.
Osseous alignment appears anatomic. Fluoroscopy provided for 16
seconds.
IMPRESSION: 1. Intraoperative fluoroscopic spot images demonstrating RIGHT hip
arthroplasty hardware. No evidence of surgical complicating feature.
2. Please see operative report for further details.

## 2020-11-22 NOTE — Progress Notes (Signed)
Sent message, via epic in basket, requesting orders in epic from surgeon.  

## 2020-11-23 ENCOUNTER — Other Ambulatory Visit: Payer: Self-pay

## 2020-11-23 ENCOUNTER — Inpatient Hospital Stay: Payer: Medicare Other

## 2020-11-23 ENCOUNTER — Inpatient Hospital Stay: Payer: Medicare Other | Attending: Hematology | Admitting: Hematology

## 2020-11-23 VITALS — BP 113/55 | HR 85 | Temp 97.8°F | Resp 18 | Wt 127.1 lb

## 2020-11-23 DIAGNOSIS — D631 Anemia in chronic kidney disease: Secondary | ICD-10-CM | POA: Insufficient documentation

## 2020-11-23 DIAGNOSIS — N189 Chronic kidney disease, unspecified: Secondary | ICD-10-CM | POA: Diagnosis not present

## 2020-11-23 DIAGNOSIS — Z809 Family history of malignant neoplasm, unspecified: Secondary | ICD-10-CM | POA: Insufficient documentation

## 2020-11-23 DIAGNOSIS — I129 Hypertensive chronic kidney disease with stage 1 through stage 4 chronic kidney disease, or unspecified chronic kidney disease: Secondary | ICD-10-CM | POA: Insufficient documentation

## 2020-11-23 DIAGNOSIS — Z87891 Personal history of nicotine dependence: Secondary | ICD-10-CM | POA: Diagnosis not present

## 2020-11-23 DIAGNOSIS — I10 Essential (primary) hypertension: Secondary | ICD-10-CM | POA: Diagnosis not present

## 2020-11-23 DIAGNOSIS — E538 Deficiency of other specified B group vitamins: Secondary | ICD-10-CM | POA: Diagnosis not present

## 2020-11-23 DIAGNOSIS — D509 Iron deficiency anemia, unspecified: Secondary | ICD-10-CM | POA: Diagnosis not present

## 2020-11-23 DIAGNOSIS — E876 Hypokalemia: Secondary | ICD-10-CM | POA: Insufficient documentation

## 2020-11-23 DIAGNOSIS — M129 Arthropathy, unspecified: Secondary | ICD-10-CM | POA: Insufficient documentation

## 2020-11-23 DIAGNOSIS — Z79899 Other long term (current) drug therapy: Secondary | ICD-10-CM | POA: Diagnosis not present

## 2020-11-23 LAB — CMP (CANCER CENTER ONLY)
ALT: 10 U/L (ref 0–44)
AST: 11 U/L — ABNORMAL LOW (ref 15–41)
Albumin: 3.2 g/dL — ABNORMAL LOW (ref 3.5–5.0)
Alkaline Phosphatase: 81 U/L (ref 38–126)
Anion gap: 12 (ref 5–15)
BUN: 28 mg/dL — ABNORMAL HIGH (ref 8–23)
CO2: 26 mmol/L (ref 22–32)
Calcium: 9 mg/dL (ref 8.9–10.3)
Chloride: 100 mmol/L (ref 98–111)
Creatinine: 0.95 mg/dL (ref 0.44–1.00)
GFR, Estimated: 59 mL/min — ABNORMAL LOW (ref >60)
Glucose, Bld: 95 mg/dL (ref 70–99)
Potassium: 2.9 mmol/L — ABNORMAL LOW (ref 3.5–5.1)
Sodium: 138 mmol/L (ref 135–145)
Total Bilirubin: 0.4 mg/dL (ref 0.3–1.2)
Total Protein: 7.2 g/dL (ref 6.5–8.1)

## 2020-11-23 LAB — CBC WITH DIFFERENTIAL/PLATELET
Abs Immature Granulocytes: 0.03 10*3/uL (ref 0.00–0.07)
Basophils Absolute: 0 10*3/uL (ref 0.0–0.1)
Basophils Relative: 1 %
Eosinophils Absolute: 0.1 10*3/uL (ref 0.0–0.5)
Eosinophils Relative: 2 %
HCT: 29.6 % — ABNORMAL LOW (ref 36.0–46.0)
Hemoglobin: 9.8 g/dL — ABNORMAL LOW (ref 12.0–15.0)
Immature Granulocytes: 1 %
Lymphocytes Relative: 25 %
Lymphs Abs: 1.6 10*3/uL (ref 0.7–4.0)
MCH: 30.6 pg (ref 26.0–34.0)
MCHC: 33.1 g/dL (ref 30.0–36.0)
MCV: 92.5 fL (ref 80.0–100.0)
Monocytes Absolute: 0.7 10*3/uL (ref 0.1–1.0)
Monocytes Relative: 10 %
Neutro Abs: 3.9 10*3/uL (ref 1.7–7.7)
Neutrophils Relative %: 61 %
Platelets: 347 10*3/uL (ref 150–400)
RBC: 3.2 MIL/uL — ABNORMAL LOW (ref 3.87–5.11)
RDW: 13.1 % (ref 11.5–15.5)
WBC: 6.3 10*3/uL (ref 4.0–10.5)
nRBC: 0 % (ref 0.0–0.2)

## 2020-11-23 LAB — IRON AND TIBC
Iron: 30 ug/dL — ABNORMAL LOW (ref 41–142)
Saturation Ratios: 13 % — ABNORMAL LOW (ref 21–57)
TIBC: 233 ug/dL — ABNORMAL LOW (ref 236–444)
UIBC: 203 ug/dL (ref 120–384)

## 2020-11-23 LAB — SAMPLE TO BLOOD BANK

## 2020-11-23 LAB — FERRITIN: Ferritin: 173 ng/mL (ref 11–307)

## 2020-11-23 NOTE — Patient Instructions (Signed)
Thank you for choosing [facility] to provide your care.   Should you have questions after your visit to the [facility] ([facility]), please contact this office at [phone] between 8:30 AM and 4:30 PM.  Voice mails left after 4:00 PM may not be returned until the following business day.  Calls received after 4:30 PM will be answered by an off-site Nurse Triage Line.    Prescription Refills:  Please have your pharmacy contact us directly for most prescription requests.  Contact the office directly for refills of narcotics (pain medications). Allow 48-72 hours for refills.  Appointments: Please contact the [facility] scheduling department [phone] for questions regarding [facility] appointment scheduling.  Contact the schedulers with any scheduling changes so that your appointment can be rescheduled in a timely manner.   Central Scheduling for [place] [phone] - Call to schedule procedures such as PET scans, CT scans, MRI, Ultrasound, etc.  To afford each patient quality time with our providers, please arrive 30 minutes before your scheduled appointment time.  If you arrive late for your appointment, you may be asked to reschedule.  We strive to give you quality time with our providers, and arriving late affects you and other patients whose appointments are after yours. If you are a no show for multiple scheduled visits, you may be dismissed from the clinic at the providers discretion.     Resources: [facility] Social Workers [phone] for additional information on assistance programs or assistance connecting with community support programs   [place] DSS  [phone]: Information regarding food stamps, Medicaid, and utility assistance [place] [phone]   [place] [employer]'s shared-ride transportation service for eligible riders who have a disability that prevents them from riding the fixed route bus.   Medicare Rights Center 800-333-4114  Helps people with Medicare understand their rights and benefits, navigate the Medicare system, and secure the quality healthcare they deserve [employer] 800-227-2345 Assists patients locate various types of support and financial assistance Cancer Care: 1-800-813-HOPE (4673) Provides financial assistance, online support groups, medication/co-pay assistance.   Transportation Assistance for appointments at [facility]: Transportation Coordinator [phone]  Again, thank you for choosing [facility] for your care.       

## 2020-11-23 NOTE — Progress Notes (Addendum)
Marland Kitchen   HEMATOLOGY/ONCOLOGY CONSULTATION NOTE  Date of Service: 11/23/2020  Patient Care Team: Merrilee Seashore, MD as PCP - General (Internal Medicine)  CHIEF COMPLAINTS/PURPOSE OF CONSULTATION:  Iron deficiency anemia  HISTORY OF PRESENTING ILLNESS:  Amy Carroll is a wonderful 86 y.o. female who has been referred to [place] by Dr .Merrilee Seashore, MD  for evaluation and management of iron deficiency anemia to optimize her hemoglobin prior to potential knee replacement.  Patient has a history of hypertension, arthritis, prediabetes, B12 deficiency had recent labs with her primary care physician on 10/26/2020 including CBC which showed a hemoglobin of 11.1, MCV of 93.9 with a WBC count of 5.9k and platelets of 349k. Her recent vitamin B12 levels were 203. Iron level was noted to be 33 and we did not have access to an iron saturation or ferritin level. She was started on vitamin B12 to 50 mcg sublingual daily and 2 [place] Iron tablets daily over the last 2 to 3 weeks. Patient notes that her energy levels have improved significantly.  Patient notes no overt evidence of bleeding.  No melena no other overt evidence of GI bleeding no hematuria no epistaxis no gum bleeding. Fecal occult blood testing done with primary care physician on 11/09/2020 was negative.  She notes that she has been scheduled for left total knee arthroplasty on 12/12/2020 with Dr. Hector Shade.  Patient notes no other acute new symptoms.  Notes that other than her knee issues she has been quite active even at her age. No sudden weight loss or other acute new focal symptoms.  MEDICAL HISTORY:  Past Medical History:  Diagnosis Date   Arthritis    Gait abnormality 06/20/2020   Hypertension     SURGICAL HISTORY: Past Surgical History:  Procedure Laterality Date   ABDOMINAL HYSTERECTOMY     COLONOSCOPY     HEMANGIOMA EXCISION     JOINT REPLACEMENT     TONSILLECTOMY     TOTAL HIP ARTHROPLASTY Right  02/18/2019   Procedure: TOTAL HIP ARTHROPLASTY ANTERIOR APPROACH;  Surgeon: Gaynelle Arabian, MD;  Location: [facility];  Service: Orthopedics;  Laterality: Right;  125mn   TOTAL KNEE ARTHROPLASTY Right 05/27/2012   Procedure: TOTAL KNEE ARTHROPLASTY;  Surgeon: GMeredith Pel MD;  [place] [facility]  Service: Orthopedics;  Laterality: Right;  Right Total Knee Arthroplasty   TOTAL KNEE REVISION Right 08/06/2018   Procedure: TOTAL KNEE REVISION;  Surgeon: AGaynelle Arabian MD;  Location: [facility];  Service: Orthopedics;  Laterality: Right;   TUBAL LIGATION      SOCIAL HISTORY: Social History   Socioeconomic History   Marital status: Married    Spouse name: DShanon Brow  Number of children: Not on file   Years of education: Not on file   Highest education level: Not on file  Occupational History   Not on file  Tobacco Use   Smoking status: Former    Types: Cigarettes    Quit date: 1999    Years since quitting: 23.7   Smokeless tobacco: Never  Vaping Use   Vaping Use: Never used  Substance and Sexual Activity   Alcohol use: Yes    Alcohol/week: 1.0 standard drink    Types: 1 Glasses of wine per week    Comment: daily   Drug use: No   Sexual activity: Not on file  Other Topics Concern   Not on file  Social History Narrative   Lives with husband   Right Handed   Drinks caffeine rarely  Social Determinants of Health   Financial Resource Strain: Not on file  Food Insecurity: Not on file  Transportation Needs: Not on file  Physical Activity: Not on file  Stress: Not on file  Social Connections: Not on file  Intimate Partner Violence: Not on file    FAMILY HISTORY: Family History  Problem Relation Age of Onset   Cancer Mother    Heart disease Father    Cancer Brother    Asthma Brother     ALLERGIES:  has No Known Allergies.  MEDICATIONS:  Current Outpatient Medications  Medication Sig Dispense Refill   acetaminophen (TYLENOL) 325 MG tablet Take 650 mg by mouth every 6 (six)  hours as needed for moderate pain.     amLODipine (NORVASC) 5 MG tablet Take 5 mg by mouth daily.      cholecalciferol (VITAMIN D3) 25 MCG (1000 UT) tablet Take 1,000 Units by mouth daily.     COMBIGAN 0.2-0.5 % ophthalmic solution Place 1 drop into the right eye daily.      dorzolamide (TRUSOPT) 2 % ophthalmic solution Place 1 drop into the right eye 2 (two) times daily.      HYDROcodone-acetaminophen (NORCO/VICODIN) 5-325 MG tablet Take 1-2 tablets by mouth every 6 (six) hours as needed for severe pain. 56 tablet 0   latanoprost (XALATAN) 0.005 % ophthalmic solution Place 1 drop into both eyes at bedtime.      methocarbamol (ROBAXIN) 500 MG tablet Take 1 tablet (500 mg total) by mouth every 6 (six) hours as needed for muscle spasms. 40 tablet 0   Multiple Vitamins-Minerals (PRESERVISION AREDS 2 PO) Take 2 capsules by mouth daily.     olmesartan-hydrochlorothiazide (BENICAR HCT) 40-25 MG tablet Take 1 tablet by mouth daily.     No current facility-administered medications for this visit.    REVIEW OF SYSTEMS:    10 Point review of Systems was done is negative except as noted above.  PHYSICAL EXAMINATION: ECOG PERFORMANCE STATUS:   . Vitals:   11/23/20 1317  BP: (!) 113/55  Pulse: 85  Resp: 18  Temp: 97.8 F (36.6 C)  SpO2: 100%   Filed Weights   11/23/20 1317  Weight: 127 lb 1.6 oz (57.7 kg)   .Body mass index is 22.51 kg/m.  GENERAL:alert, in no acute distress and comfortable SKIN: no acute rashes, no significant lesions EYES: conjunctiva are pink and non-injected, sclera anicteric OROPHARYNX: MMM, no exudates, no oropharyngeal erythema or ulceration NECK: supple, no JVD LYMPH:  no palpable lymphadenopathy in the cervical, axillary or inguinal regions LUNGS: clear to auscultation b/l with normal respiratory effort HEART: regular rate & rhythm ABDOMEN:  normoactive bowel sounds , non tender, not distended. Extremity: Bilateral 1+ pedal edema PSYCH: alert & oriented x  3 with fluent speech NEURO: no focal motor/sensory deficits  LABORATORY DATA:  I have reviewed the data as listed  . CBC Latest Ref Rng & Units 11/23/2020 02/20/2019 02/19/2019  WBC 4.0 - 10.5 K/uL 6.3 8.6 10.1  Hemoglobin 12.0 - 15.0 g/dL 9.8(L) 9.5(L) 10.0(L)  Hematocrit 36.0 - 46.0 % 29.6(L) 29.6(L) 30.8(L)  Platelets 150 - 400 K/uL 347 182 204   .CBC    Component Value Date/Time   WBC 6.3 11/23/2020 1434   RBC 3.20 (L) 11/23/2020 1434   HGB 9.8 (L) 11/23/2020 1434   HCT 29.6 (L) 11/23/2020 1434   PLT 347 11/23/2020 1434   MCV 92.5 11/23/2020 1434   MCH 30.6 11/23/2020 1434   MCHC 33.1 11/23/2020 1434  RDW 13.1 11/23/2020 1434   LYMPHSABS 1.6 11/23/2020 1434   MONOABS 0.7 11/23/2020 1434   EOSABS 0.1 11/23/2020 1434   BASOSABS 0.0 11/23/2020 1434     . CMP Latest Ref Rng & Units 11/23/2020 02/20/2019 02/19/2019  Glucose 70 - 99 mg/dL 95 107(H) 126(H)  BUN 8 - 23 mg/dL 28(H) 23 22  Creatinine 0.44 - 1.00 mg/dL 0.95 0.77 0.61  Sodium 135 - 145 mmol/L 138 137 137  Potassium 3.5 - 5.1 mmol/L 2.9(L) 3.5 3.6  Chloride 98 - 111 mmol/L 100 102 105  CO2 22 - 32 mmol/L 26 26 24   Calcium 8.9 - 10.3 mg/dL 9.0 8.8(L) 8.6(L)  Total Protein 6.5 - 8.1 g/dL 7.2 - -  Total Bilirubin 0.3 - 1.2 mg/dL 0.4 - -  Alkaline Phos 38 - 126 U/L 81 - -  AST 15 - 41 U/L 11(L) - -  ALT 0 - 44 U/L 10 - -   B12 -- 203 on 10/26/2020  . Lab Results  Component Value Date   IRON 30 (L) 11/23/2020   TIBC 233 (L) 11/23/2020   IRONPCTSAT 13 (L) 11/23/2020   (Iron and TIBC)  Lab Results  Component Value Date   FERRITIN 173 11/23/2020     RADIOGRAPHIC STUDIES: I have personally reviewed the radiological images as listed and agreed with the findings in the report. No results found.  ASSESSMENT & PLAN:   86 year old female with history of hypertension, prediabetes, arthritis, gait disorder/weakness, mild memory issues referred for evaluation of  #1 Normocytic anemia Labs today show  hemoglobin of 9.8 with an MCV of 92.5. (Has previously been macrocytic based on labs in 2020) She recently was noted to be B12 deficient with a level of 203 on 10/26/2020--repeat labs show improving B12 level with sublingual replacement. 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 Her stool occult blood testing x1 was negative with her primary care physician. Cannot rule out an element of MDS given the patient's age and also some anemia of chronic kidney disease though her creatinine number per se is within normal limits. She is being seen for hematology consultation in the context of currently contemplated left total knee arthroplasty. PLAN -Patient has been started on oral iron replacement and sublingual B12 replacement by her primary care physician. -Her anemia appears to be multifactorial from B12 deficiency, functional iron deficiency but cannot rule out additional factors at this time including possible MDS and anemia of chronic inflammation or chronic kidney disease. -From a surgery standpoint hemoglobin of more than 9 would not be an absolute contraindication for surgery. -However to optimize her hemoglobin she could consider IV iron replacement to optimize her ferritin to more than 250 iron saturation of closer to 30% -Would also recommend vitamin B complex 1 capsule p.o. daily to address any other associated B vitamin deficiencies. -If her anemia does not correct with optimal B12 and iron replacement she might need additional work-up based on her goals of care.   This would include checking an erythropoietin level, potential bone marrow biopsy to rule out MDS and a broader work-up of her anemia. -Would also defer to primary care physician about confirming that her arthritis is in fact only osteoarthritis and not an inflammatory arthritis which can also present with such a blood  picture. -This work-up and results from above interventions are unlikely to make a significant change in her hemoglobin levels by  her currently scheduled surgery time of 12/12/2020 so it would be her primary doctor and orthopedic doctors call whether she wants to proceed for surgery with blood transfusion support if needed to maintain hemoglobin more than 9 or if this needs to be delayed.  #2 hypokalemia potassium 2.9 -Started on p.o. potassium replacement 40 mg p.o. daily given prescription for 15 days -Patient needs to follow-up with primary care physician for continued potassium replacement for hypokalemia likely related to her diuretics.  Follow-up we shall call her to discuss her lab results  All of the patients questions were answered with apparent satisfaction. The patient knows to call the clinic with any problems, questions or concerns.  I spent 50mns counseling the patient face to face. The total time spent in the appointment was 457ms  and more than 50% was on counseling and direct patient cares.    GaSullivan LoneD MSKasotaAHIVMS SCAch Behavioral Health And Wellness ServicesTBascom Palmer Surgery Centerematology/Oncology Physician CoDeer Creek Surgery Center LLC(Office):       [phone] cell):  [phone]):           [phone]/28/2022 1:24 PM

## 2020-11-30 ENCOUNTER — Other Ambulatory Visit: Payer: Self-pay

## 2020-11-30 ENCOUNTER — Encounter (INDEPENDENT_AMBULATORY_CARE_PROVIDER_SITE_OTHER): Payer: Medicare Other | Admitting: Ophthalmology

## 2020-11-30 DIAGNOSIS — H353211 Exudative age-related macular degeneration, right eye, with active choroidal neovascularization: Secondary | ICD-10-CM | POA: Diagnosis not present

## 2020-11-30 DIAGNOSIS — H35033 Hypertensive retinopathy, bilateral: Secondary | ICD-10-CM

## 2020-11-30 DIAGNOSIS — I10 Essential (primary) hypertension: Secondary | ICD-10-CM | POA: Diagnosis not present

## 2020-11-30 DIAGNOSIS — H353122 Nonexudative age-related macular degeneration, left eye, intermediate dry stage: Secondary | ICD-10-CM

## 2020-11-30 DIAGNOSIS — H43813 Vitreous degeneration, bilateral: Secondary | ICD-10-CM

## 2020-11-30 MED ORDER — POTASSIUM CHLORIDE CRYS ER 20 MEQ PO TBCR: 40.0000 meq | EXTENDED_RELEASE_TABLET | Freq: Every day | ORAL | 0 refills | Status: DC

## 2020-11-30 NOTE — Addendum Note (Signed)
Addended by: Sullivan Lone on: 11/30/2020 10:20 AM   Modules accepted: Orders

## 2020-11-30 NOTE — Progress Notes (Signed)
Contacted pt per Dr Irene Limbo: to let Amy Carroll know her potassium levels are low and he sent in potassium replacement pills.  This is likely due to her diuretics and have given her enough supply for 15 days but she needs to connect with primary care physician for continued management of hypokalemia.    Please set her up for a phone visit  to discuss her lab results further management of her anemia.  Pt to have phone visit on Friday ay 12:45. Pt acknowledged information and verbalized understanding.

## 2020-12-01 NOTE — Patient Instructions (Signed)
DUE TO COVID-19 ONLY ONE VISITOR IS ALLOWED TO COME WITH YOU AND STAY IN THE WAITING ROOM ONLY DURING PRE OP AND PROCEDURE.   **NO VISITORS ARE ALLOWED IN THE SHORT STAY AREA OR RECOVERY ROOM!!**  IF YOU WILL BE ADMITTED INTO THE HOSPITAL YOU ARE ALLOWED ONLY TWO SUPPORT PEOPLE DURING VISITATION HOURS ONLY (10AM -8PM)   The support person(s) may change daily. The support person(s) must pass our screening, gel in and out, and wear a mask at all times, including in the patient's room. Patients must also wear a mask when staff or their support person are in the room.  No visitors under the age of 24. Any visitor under the age of 22 must be accompanied by an adult.    COVID SWAB TESTING MUST BE COMPLETED ON:  12/08/20           From 8 am to 3 pm **MUST PRESENT COMPLETED FORM AT TESTING SITE**    [place] (backside of the building) You are not required to quarantine, however you are required to wear a well-fitted mask when you are out and around people not in your household.  Hand Hygiene often Do NOT share personal items Notify your provider if you are in close contact with someone who has COVID or you develop fever 100.4 or greater, new onset of sneezing, cough, sore throat, shortness of breath or body aches.  [place], [address], must go inside of the hospital, NOT A DRIVE THRU!  (Must self quarantine after testing. Follow instructions on handout.)       Your procedure is scheduled on: 12/12/20   Report to [facility] Main Entrance    Report to admitting at: 6:35 AM   Call this number if you have problems the morning of surgery [phone]   Do not eat food :After Midnight.   May have liquids until: 6:20 AM    day of surgery  CLEAR LIQUID DIET  Foods Allowed                                                                     Foods Excluded  Water, Black Coffee and tea, regular  and decaf                             liquids that you cannot  Plain Jell-O in any flavor  (No red)                                           see through such as: Fruit ices (not with fruit pulp)                                     milk, soups, orange juice              Iced Popsicles (No red)  All solid food                                   Apple juices Sports drinks like Gatorade (No red) Lightly seasoned clear broth or consume(fat free) Sugar,   Sample Menu Breakfast                                Lunch                                     Supper Cranberry juice                    Beef broth                            Chicken broth Jell-O                                     Grape juice                           Apple juice Coffee or tea                        Jell-O                                      Popsicle                                                Coffee or tea                        Coffee or tea      Complete one Ensure drink the morning of surgery at: 6:20 AM    the day of surgery.    The day of surgery:  Drink ONE (1) Pre-Surgery Clear Ensure or G2 by am the morning of surgery. Drink in one sitting. Do not sip.  This drink was given to you during your hospital  pre-op appointment visit. Nothing else to drink after completing the  Pre-Surgery Clear Ensure or G2.          If you have questions, please contact your surgeon's office.     Oral Hygiene is also important to reduce your risk of infection.                                    Remember - BRUSH YOUR TEETH THE MORNING OF SURGERY WITH YOUR REGULAR TOOTHPASTE   Do NOT smoke after Midnight   Take these medicines the morning of surgery with A SIP OF WATER: amlodipine. Use eye drops as usual.  DO NOT TAKE ANY ORAL DIABETIC MEDICATIONS DAY OF YOUR SURGERY  You may not have any metal on your body including hair pins, jewelry, and body piercing              Do not wear make-up, lotions, powders, perfumes/cologne, or deodorant  Do not wear nail polish including gel and S&S, artificial/acrylic nails, or any other type of covering on natural nails including finger and toenails. If you have artificial nails, gel coating, etc. that needs to be removed by a nail salon please have this removed prior to surgery or surgery may need to be canceled/ delayed if the surgeon/ anesthesia feels like they are unable to be safely monitored.   Do not shave  48 hours prior to surgery.    Do not bring valuables to the hospital. [place].   Contacts, dentures or bridgework may not be worn into surgery.   Bring small overnight bag day of surgery.    Patients discharged on the day of surgery will not be allowed to drive home.   Special Instructions: Bring a copy of your healthcare power of attorney and living will documents         the day of surgery if you haven't scanned them before.              Please read over the following fact sheets you were given: IF YOU HAVE QUESTIONS ABOUT YOUR PRE-OP INSTRUCTIONS PLEASE CALL [phone]   [facility] - Preparing for Surgery Before surgery, you can play an important role.  Because skin is not sterile, your skin needs to be as free of germs as possible.  You can reduce the number of germs on your skin by washing with CHG (chlorahexidine gluconate) soap before surgery.  CHG is an antiseptic cleaner which kills germs and bonds with the skin to continue killing germs even after washing. Please DO NOT use if you have an allergy to CHG or antibacterial soaps.  If your skin becomes reddened/irritated stop using the CHG and inform your nurse when you arrive at Short Stay. Do not shave (including legs and underarms) for at least 48 hours prior to the first CHG shower.  You may shave your face/neck. Please follow these instructions carefully:  1.  Shower with CHG Soap the night  before surgery and the  morning of Surgery.  2.  If you choose to wash your hair, wash your hair first as usual with your  normal  shampoo.  3.  After you shampoo, rinse your hair and body thoroughly to remove the  shampoo.                           4.  Use CHG as you would any other liquid soap.  You can apply chg directly  to the skin and wash                       Gently with a scrungie or clean washcloth.  5.  Apply the CHG Soap to your body ONLY FROM THE NECK DOWN.   Do not use on face/ open                           Wound or open sores. Avoid contact with eyes, ears mouth and genitals (private parts).  Wash face,  Genitals (private parts) with your normal soap.             6.  Wash thoroughly, paying special attention to the area where your surgery  will be performed.  7.  Thoroughly rinse your body with warm water from the neck down.  8.  DO NOT shower/wash with your normal soap after using and rinsing off  the CHG Soap.                9.  Pat yourself dry with a clean towel.            10.  Wear clean pajamas.            11.  Place clean sheets on your bed the night of your first shower and do not  sleep with pets. Day of Surgery : Do not apply any lotions/deodorants the morning of surgery.  Please wear clean clothes to the hospital/surgery center.  FAILURE TO FOLLOW THESE INSTRUCTIONS MAY RESULT IN THE CANCELLATION OF YOUR SURGERY PATIENT SIGNATURE_________________________________  NURSE SIGNATURE__________________________________  ________________________________________________________________________   Amy Carroll  An incentive spirometer is a tool that can help keep your lungs clear and active. This tool measures how well you are filling your lungs with each breath. Taking long deep breaths may help reverse or decrease the chance of developing breathing (pulmonary) problems (especially infection) following: A long period of time when you are unable  to move or be active. BEFORE THE PROCEDURE  If the spirometer includes an indicator to show your best effort, your nurse or respiratory therapist will set it to a desired goal. If possible, sit up straight or lean slightly forward. Try not to slouch. Hold the incentive spirometer in an upright position. INSTRUCTIONS FOR USE  Sit on the edge of your bed if possible, or sit up as far as you can in bed or on a chair. Hold the incentive spirometer in an upright position. Breathe out normally. Place the mouthpiece in your mouth and seal your lips tightly around it. Breathe in slowly and as deeply as possible, raising the piston or the ball toward the top of the column. Hold your breath for 3-5 seconds or for as long as possible. Allow the piston or ball to fall to the bottom of the column. Remove the mouthpiece from your mouth and breathe out normally. Rest for a few seconds and repeat Steps 1 through 7 at least 10 times every 1-2 hours when you are awake. Take your time and take a few normal breaths between deep breaths. The spirometer may include an indicator to show your best effort. Use the indicator as a goal to work toward during each repetition. After each set of 10 deep breaths, practice coughing to be sure your lungs are clear. If you have an incision (the cut made at the time of surgery), support your incision when coughing by placing a pillow or rolled up towels firmly against it. Once you are able to get out of bed, walk around indoors and cough well. You may stop using the incentive spirometer when instructed by your caregiver.  RISKS AND COMPLICATIONS Take your time so you do not get dizzy or light-headed. If you are in pain, you may need to take or ask for pain medication before doing incentive spirometry. It is harder to take a deep breath if you are having pain. AFTER USE Rest and breathe slowly and easily. It can be helpful to keep track of  a log of your progress. Your caregiver  can provide you with a simple table to help with this. If you are using the spirometer at home, follow these instructions: [place] IF:  You are having difficultly using the spirometer. You have trouble using the spirometer as often as instructed. Your pain medication is not giving enough relief while using the spirometer. You develop fever of 100.5 F (38.1 C) or higher. SEEK IMMEDIATE MEDICAL CARE IF:  You cough up bloody sputum that had not been present before. You develop fever of 102 F (38.9 C) or greater. You develop worsening pain at or near the incision site. MAKE SURE YOU:  Understand these instructions. Will watch your condition. Will get help right away if you are not doing well or get worse. Document Released: 06/25/2006 Document Revised: 05/07/2011 Document Reviewed: 08/26/2006 [facility] Patient Information 2014 [place], [place].   ________________________________________________________________________

## 2020-12-02 ENCOUNTER — Telehealth: Payer: Self-pay | Admitting: Hematology

## 2020-12-02 ENCOUNTER — Other Ambulatory Visit: Payer: Self-pay

## 2020-12-02 ENCOUNTER — Inpatient Hospital Stay: Payer: Medicare Other | Attending: Hematology | Admitting: Hematology

## 2020-12-02 ENCOUNTER — Encounter (HOSPITAL_COMMUNITY): Payer: Self-pay

## 2020-12-02 ENCOUNTER — Encounter (HOSPITAL_COMMUNITY)
Admission: RE | Admit: 2020-12-02 | Discharge: 2020-12-02 | Disposition: A | Discharge status: Home / Self Care | Payer: Medicare Other | Source: Ambulatory Visit | Attending: Orthopedic Surgery | Admitting: Orthopedic Surgery

## 2020-12-02 DIAGNOSIS — D509 Iron deficiency anemia, unspecified: Secondary | ICD-10-CM | POA: Insufficient documentation

## 2020-12-02 DIAGNOSIS — Z01818 Encounter for other preprocedural examination: Secondary | ICD-10-CM | POA: Diagnosis not present

## 2020-12-02 DIAGNOSIS — Z79899 Other long term (current) drug therapy: Secondary | ICD-10-CM | POA: Insufficient documentation

## 2020-12-02 HISTORY — DX: Anemia, unspecified: D64.9

## 2020-12-02 LAB — CBC
HCT: 32.8 % — ABNORMAL LOW (ref 36.0–46.0)
Hemoglobin: 10.3 g/dL — ABNORMAL LOW (ref 12.0–15.0)
MCH: 30.3 pg (ref 26.0–34.0)
MCHC: 31.4 g/dL (ref 30.0–36.0)
MCV: 96.5 fL (ref 80.0–100.0)
Platelets: 359 10*3/uL (ref 150–400)
RBC: 3.4 MIL/uL — ABNORMAL LOW (ref 3.87–5.11)
RDW: 13.3 % (ref 11.5–15.5)
WBC: 5.5 10*3/uL (ref 4.0–10.5)
nRBC: 0 % (ref 0.0–0.2)

## 2020-12-02 LAB — COMPREHENSIVE METABOLIC PANEL
ALT: 12 U/L (ref 0–44)
AST: 14 U/L — ABNORMAL LOW (ref 15–41)
Albumin: 3.4 g/dL — ABNORMAL LOW (ref 3.5–5.0)
Alkaline Phosphatase: 66 U/L (ref 38–126)
Anion gap: 9 (ref 5–15)
BUN: 26 mg/dL — ABNORMAL HIGH (ref 8–23)
CO2: 29 mmol/L (ref 22–32)
Calcium: 8.9 mg/dL (ref 8.9–10.3)
Chloride: 106 mmol/L (ref 98–111)
Creatinine, Ser: 1.01 mg/dL — ABNORMAL HIGH (ref 0.44–1.00)
GFR, Estimated: 55 mL/min — ABNORMAL LOW (ref >60)
Glucose, Bld: 94 mg/dL (ref 70–99)
Potassium: 4.2 mmol/L (ref 3.5–5.1)
Sodium: 144 mmol/L (ref 135–145)
Total Bilirubin: 0.3 mg/dL (ref 0.3–1.2)
Total Protein: 7.1 g/dL (ref 6.5–8.1)

## 2020-12-02 LAB — SURGICAL PCR SCREEN
MRSA, PCR: NEGATIVE
Staphylococcus aureus: NEGATIVE

## 2020-12-02 LAB — PROTIME-INR
INR: 0.9 (ref 0.8–1.2)
Prothrombin Time: 12.1 seconds (ref 11.4–15.2)

## 2020-12-02 NOTE — Telephone Encounter (Signed)
Left message with follow-up appointments per 10/7 los.

## 2020-12-02 NOTE — Progress Notes (Addendum)
COVID Vaccine Completed: es Date COVID Vaccine completed: 2022. X 4 COVID vaccine manufacturer:  Moderna    COVID Test: 12/08/20  PCP - Dr. Merrilee Seashore Cardiologist -   Chest x-ray -  EKG - 10/25/20: Chart Stress Test -  ECHO -  Cardiac Cath -  Pacemaker/ICD device last checked:  Sleep Study -  CPAP -   Fasting Blood Sugar -  Checks Blood Sugar _____ times a day  Blood Thinner Instructions: Aspirin Instructions: Last Dose:  Anesthesia review: Hx: HTN  Patient denies shortness of breath, fever, cough and chest pain at PAT appointment   Patient verbalized understanding of instructions that were given to them at the PAT appointment. Patient was also instructed that they will need to review over the PAT instructions again at home before surgery.

## 2020-12-05 DIAGNOSIS — Z0001 Encounter for general adult medical examination with abnormal findings: Secondary | ICD-10-CM | POA: Diagnosis not present

## 2020-12-05 DIAGNOSIS — E876 Hypokalemia: Secondary | ICD-10-CM | POA: Diagnosis not present

## 2020-12-05 DIAGNOSIS — D509 Iron deficiency anemia, unspecified: Secondary | ICD-10-CM | POA: Diagnosis not present

## 2020-12-05 DIAGNOSIS — Z01818 Encounter for other preprocedural examination: Secondary | ICD-10-CM | POA: Diagnosis not present

## 2020-12-07 ENCOUNTER — Other Ambulatory Visit: Payer: Self-pay | Admitting: Hematology

## 2020-12-08 ENCOUNTER — Other Ambulatory Visit: Payer: Self-pay | Admitting: Orthopedic Surgery

## 2020-12-08 ENCOUNTER — Inpatient Hospital Stay: Payer: Medicare Other

## 2020-12-08 ENCOUNTER — Encounter: Payer: Self-pay | Admitting: Hematology

## 2020-12-08 ENCOUNTER — Other Ambulatory Visit: Payer: Self-pay

## 2020-12-08 VITALS — BP 131/62 | HR 80 | Temp 97.7°F | Resp 18

## 2020-12-08 DIAGNOSIS — Z79899 Other long term (current) drug therapy: Secondary | ICD-10-CM | POA: Diagnosis not present

## 2020-12-08 DIAGNOSIS — D509 Iron deficiency anemia, unspecified: Secondary | ICD-10-CM

## 2020-12-08 LAB — SARS CORONAVIRUS 2 (TAT 6-24 HRS): SARS Coronavirus 2: NEGATIVE

## 2020-12-08 MED ORDER — SODIUM CHLORIDE 0.9 % IV SOLN: Freq: Once | INTRAVENOUS | Status: AC

## 2020-12-08 MED ORDER — ACETAMINOPHEN 325 MG PO TABS
650.0000 mg | ORAL_TABLET | Freq: Once | ORAL | Status: AC
  Administered 2020-12-08: 650 mg via ORAL
  Filled 2020-12-08: qty 2

## 2020-12-08 MED ORDER — SODIUM CHLORIDE 0.9 % IV SOLN
750.0000 mg | Freq: Once | INTRAVENOUS | Status: AC
  Administered 2020-12-08: 750 mg via INTRAVENOUS
  Filled 2020-12-08: qty 15

## 2020-12-08 MED ORDER — LORATADINE 10 MG PO TABS
10.0000 mg | ORAL_TABLET | Freq: Once | ORAL | Status: AC
  Administered 2020-12-08: 10 mg via ORAL
  Filled 2020-12-08: qty 1

## 2020-12-08 NOTE — Progress Notes (Signed)
Pt observed for 30 minutes post Injectafer infusion.  Tolerated treatment well without incident.  VSS at discharge.  Wheelchair assist to lobby.

## 2020-12-08 NOTE — Progress Notes (Signed)
Marland Kitchen   HEMATOLOGY/ONCOLOGY CONSULTATION NOTE  Date of Service: 12/08/2020  Patient Care Team: Merrilee Seashore, MD as PCP - General (Internal Medicine)  CHIEF COMPLAINTS/PURPOSE OF CONSULTATION:  Iron deficiency anemia B12 deficiency  HISTORY OF PRESENTING ILLNESS:  Amy Carroll is a wonderful 86 y.o. female who has been referred to [place] by Dr .Merrilee Seashore, MD  for evaluation and management of iron deficiency anemia to optimize her hemoglobin prior to potential knee replacement.  Patient has a history of hypertension, arthritis, prediabetes, B12 deficiency had recent labs with her primary care physician on 10/26/2020 including CBC which showed a hemoglobin of 11.1, MCV of 93.9 with a WBC count of 5.9k and platelets of 349k. Her recent vitamin B12 levels were 203. Iron level was noted to be 33 and we did not have access to an iron saturation or ferritin level. She was started on vitamin B12 to 50 mcg sublingual daily and 2 [place] Iron tablets daily over the last 2 to 3 weeks. Patient notes that her energy levels have improved significantly.  Patient notes no overt evidence of bleeding.  No melena no other overt evidence of GI bleeding no hematuria no epistaxis no gum bleeding. Fecal occult blood testing done with primary care physician on 11/09/2020 was negative.  She notes that she has been scheduled for left total knee arthroplasty on 12/12/2020 with Dr. Hector Shade.  Patient notes no other acute new symptoms.  Notes that other than her knee issues she has been quite active even at her age. No sudden weight loss or other acute new focal symptoms.  INTERVAL HISTORY  .I connected with KALASIA CRAFTON on .12/02/2020 at 12:40 PM EDT by telephone visit and verified that I am speaking with the correct person using two identifiers.   I discussed the limitations, risks, security and privacy concerns of performing an evaluation and management service by telemedicine and  the availability of in-person appointments. I also discussed with the patient that there may be a patient responsible charge related to this service. The patient expressed understanding and agreed to proceed.   Other persons participating in the visit and their role in the encounter: Patient's husband  Patient's location: Home Provider's location: [place]  Patient was called to follow-up on her lab results and investigations done to evaluate her anemia.  Patient notes she has no acute new symptoms since her clinic visit.  No overt issues with bleeding.  Still notes a lot of pain and swelling in her knee which she feels is very inflamed.  Labs done on 11/23/2020 showed a hemoglobin of 9.8 with an MCV of 92.5 ,a WBC count of 6.3k and platelets of 347k. CMP showed severe hypokalemia with a potassium of 2.9 likely related to her diuretics and albumin of 3.2 but otherwise was unremarkable. Ferritin 173 with an iron saturation of 13%   MEDICAL HISTORY:  Past Medical History:  Diagnosis Date   Anemia    Arthritis    Gait abnormality 06/20/2020   Hypertension     SURGICAL HISTORY: Past Surgical History:  Procedure Laterality Date   ABDOMINAL HYSTERECTOMY     COLONOSCOPY     HEMANGIOMA EXCISION     JOINT REPLACEMENT     TONSILLECTOMY     TOTAL HIP ARTHROPLASTY Right 02/18/2019   Procedure: TOTAL HIP ARTHROPLASTY ANTERIOR APPROACH;  Surgeon: Gaynelle Arabian, MD;  Location: [facility];  Service: Orthopedics;  Laterality: Right;  12mn   TOTAL KNEE ARTHROPLASTY Right 05/27/2012  Procedure: TOTAL KNEE ARTHROPLASTY;  Surgeon: Meredith Pel, MD;  Location: [facility];  Service: Orthopedics;  Laterality: Right;  Right Total Knee Arthroplasty   TOTAL KNEE REVISION Right 08/06/2018   Procedure: TOTAL KNEE REVISION;  Surgeon: Gaynelle Arabian, MD;  Location: [facility];  Service: Orthopedics;  Laterality: Right;   TUBAL LIGATION      SOCIAL HISTORY: Social History   Socioeconomic History    Marital status: Married    Spouse name: Shanon Brow   Number of children: Not on file   Years of education: Not on file   Highest education level: Not on file  Occupational History   Not on file  Tobacco Use   Smoking status: Former    Types: Cigarettes    Quit date: 1999    Years since quitting: 23.7   Smokeless tobacco: Never  Vaping Use   Vaping Use: Never used  Substance and Sexual Activity   Alcohol use: Yes    Alcohol/week: 1.0 standard drink    Types: 1 Glasses of wine per week    Comment: daily   Drug use: No   Sexual activity: Not on file  Other Topics Concern   Not on file  Social History Narrative   Lives with husband   Right Handed   Drinks caffeine rarely   Social Determinants of Health   Financial Resource Strain: Not on file  Food Insecurity: Not on file  Transportation Needs: Not on file  Physical Activity: Not on file  Stress: Not on file  Social Connections: Not on file  Intimate Partner Violence: Not on file    FAMILY HISTORY: Family History  Problem Relation Age of Onset   Cancer Mother    Heart disease Father    Cancer Brother    Asthma Brother     ALLERGIES:  has No Known Allergies.  MEDICATIONS:  Current Outpatient Medications  Medication Sig Dispense Refill   acetaminophen (TYLENOL) 500 MG tablet Take 500 mg by mouth every 6 (six) hours as needed for moderate pain.     amLODipine (NORVASC) 5 MG tablet Take 5 mg by mouth daily.      Camphor-Menthol-Methyl Sal (SALONPAS) 3.03-03-08 % PTCH Place 1 patch onto the skin daily as needed (knee pain).     cholecalciferol (VITAMIN D3) 25 MCG (1000 UT) tablet Take 1,000 Units by mouth daily.     COMBIGAN 0.2-0.5 % ophthalmic solution Place 1 drop into the right eye in the morning and at bedtime.     Cyanocobalamin (B-12) 2500 MCG TABS Take 2,500 mcg by mouth daily.     dorzolamide (TRUSOPT) 2 % ophthalmic solution Place 1 drop into the right eye 2 (two) times daily.      ferrous sulfate 325 (65 FE)  MG tablet Take 325 mg by mouth daily with breakfast.     HYDROcodone-acetaminophen (NORCO/VICODIN) 5-325 MG tablet Take 1-2 tablets by mouth every 6 (six) hours as needed for severe pain. (Patient not taking: No sig reported) 56 tablet 0   KLOR-CON M20 20 MEQ tablet TAKE 2 TABLETS BY MOUTH DAILY 30 tablet 0   latanoprost (XALATAN) 0.005 % ophthalmic solution Place 1 drop into both eyes at bedtime.      methocarbamol (ROBAXIN) 500 MG tablet Take 1 tablet (500 mg total) by mouth every 6 (six) hours as needed for muscle spasms. (Patient not taking: No sig reported) 40 tablet 0   Multiple Vitamins-Minerals (PRESERVISION AREDS 2 PO) Take 1 capsule by mouth in the morning  and at bedtime.     olmesartan-hydrochlorothiazide (BENICAR HCT) 40-25 MG tablet Take 1 tablet by mouth daily.     No current facility-administered medications for this visit.    REVIEW OF SYSTEMS:    10 Point review of Systems was done is negative except as noted above.  PHYSICAL EXAMINATION: Telemedicine visit LABORATORY DATA:  I have reviewed the data as listed  . CBC Latest Ref Rng & Units 11/23/2020 02/20/2019  WBC 4.0 - 10.5 K/uL 6.3 8.6  Hemoglobin 12.0 - 15.0 g/dL 9.8(L) 9.5(L)  Hematocrit 36.0 - 46.0 % 29.6(L) 29.6(L)  Platelets 150 - 400 K/uL 347 182   .CBC    Component Value Date/Time   WBC 5.5 12/02/2020 1443   RBC 3.40 (L) 12/02/2020 1443   HGB 10.3 (L) 12/02/2020 1443   HCT 32.8 (L) 12/02/2020 1443   PLT 359 12/02/2020 1443   MCV 96.5 12/02/2020 1443   MCH 30.3 12/02/2020 1443   MCHC 31.4 12/02/2020 1443   RDW 13.3 12/02/2020 1443   LYMPHSABS 1.6 11/23/2020 1434   MONOABS 0.7 11/23/2020 1434   EOSABS 0.1 11/23/2020 1434   BASOSABS 0.0 11/23/2020 1434     . CMP Latest Ref Rng & Units 11/23/2020 02/20/2019  Glucose 70 - 99 mg/dL 95 107(H)  BUN 8 - 23 mg/dL 28(H) 23  Creatinine 0.44 - 1.00 mg/dL 0.95 0.77  Sodium 135 - 145 mmol/L 138 137  Potassium 3.5 - 5.1 mmol/L 2.9(L) 3.5  Chloride 98 - 111  mmol/L 100 102  CO2 22 - 32 mmol/L 26 26  Calcium 8.9 - 10.3 mg/dL 9.0 8.8(L)  Total Protein 6.5 - 8.1 g/dL 7.2 -  Total Bilirubin 0.3 - 1.2 mg/dL 0.4 -  Alkaline Phos 38 - 126 U/L 81 -  AST 15 - 41 U/L 11(L) -  ALT 0 - 44 U/L 10 -   B12 -- 203 on 10/26/2020  . Lab Results  Component Value Date   IRON 30 (L) 11/23/2020   TIBC 233 (L) 11/23/2020   IRONPCTSAT 13 (L) 11/23/2020   (Iron and TIBC)  Lab Results  Component Value Date   FERRITIN 173 11/23/2020     RADIOGRAPHIC STUDIES: I have personally reviewed the radiological images as listed and agreed with the findings in the report. No results found.  ASSESSMENT & PLAN:   86 year old female with history of hypertension, prediabetes, arthritis, gait disorder/weakness, mild memory issues referred for evaluation of  #1 Normocytic anemia Labs today show hemoglobin of 9.8 with an MCV of 92.5. (Has previously been macrocytic based on labs in 2020) She recently was noted to be B12 deficient with a level of 203 on 10/26/2020--repeat labs show improving B12 level with sublingual replacement. 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 Her stool occult blood testing x1 was negative with her primary care physician. Cannot rule out an element of MDS given the patient's age and also some anemia of chronic kidney disease though her creatinine number per se is within normal limits. She is being seen for hematology consultation in the context of currently contemplated left total knee arthroplasty. PLAN -Patient has been started on oral iron replacement and sublingual B12 replacement by her primary care physician. -Her anemia appears to be multifactorial from B12 deficiency, functional iron deficiency but cannot rule out additional factors at this time including possible MDS and anemia of chronic inflammation or chronic kidney  disease. -  From a surgery standpoint hemoglobin of more than 9 would not be an absolute contraindication for surgery. -However to optimize her hemoglobin she could consider IV iron replacement to optimize her ferritin to more than 250 iron saturation of closer to 30%-discussed this with the patient and she is agreeable to get 1 IV iron infusion prior to surgery to try to optimize her anemia and to try to reduce need for blood transfusion or postoperative anemia. -Would also recommend vitamin B complex 1 capsule p.o. daily to address any other associated B vitamin deficiencies. -If her anemia does not correct with optimal B12 and iron replacement she might need additional work-up based on her goals of care.   This would include checking an erythropoietin level, potential bone marrow biopsy to rule out MDS and a broader work-up of her anemia-this can be done in the postoperative setting -Would also defer to primary care physician about confirming that her arthritis is in fact only osteoarthritis and not an inflammatory arthritis which can also present with such a blood picture. -This work-up and results from above interventions are unlikely to make a significant change in her hemoglobin levels by her currently scheduled surgery time of 12/12/2020 so it would be her primary doctor and orthopedic doctors call whether she wants to proceed for surgery with blood transfusion support if needed to maintain hemoglobin more than 9 or if this needs to be delayed.  #2 hypokalemia potassium 2.9 -Started on p.o. potassium replacement 40 mg p.o. daily given prescription for 15 days -Patient needs to follow-up with primary care physician for continued potassium replacement for hypokalemia likely related to her diuretics.  Follow-up IV Injectafer x 1 dose ASAP (patient having surgery on 12/12/2020 -- hoping to have IV iron early next week). RTC with Dr Irene Limbo with labs in 3 months  All of the patients questions were  answered with apparent satisfaction. The patient knows to call the clinic with any problems, questions or concerns.  . The total time spent in the appointment was 15 minutes and more than 50% was on counseling and direct patient cares.     Sullivan Lone MD [place] AAHIVMS [facility] Hematology/Oncology Physician [facility]

## 2020-12-08 NOTE — Patient Instructions (Signed)
Ferric Carboxymaltose Injection What is this medication? FERRIC CARBOXYMALTOSE (FER ik kar BOX ee MAWL tose) treats low levels of iron in your body (iron deficiency anemia). Iron is a mineral that plays an important role in making red blood cells, which carry oxygen from your lungs to the rest of your body. This medicine may be used for other purposes; ask your health care provider or pharmacist if you have questions. COMMON BRAND NAME(S): Injectafer What should I tell my care team before I take this medication? They need to know if you have any of these conditions: High blood pressure High levels of iron in the blood An unusual or allergic reaction to iron, other medications, foods, dyes, or preservatives Pregnant or trying to get pregnant Breast-feeding How should I use this medication? This medication is injected into a vein. It is given by your care team in a hospital or clinic setting. Talk to your care team about the use of this medication in children. While it may be given to children as young as 1 year for selected conditions, precautions do apply. Overdosage: If you think you have taken too much of this medicine contact a poison control center or emergency room at once. NOTE: This medicine is only for you. Do not share this medicine with others. What if I miss a dose? Keep appointments for follow-up doses. It is important not to miss your dose. Call your care team if you are unable to keep an appointment. What may interact with this medication? Do not take this medication with any of the following medications: Deferoxamine Dimercaprol Other iron products This list may not describe all possible interactions. Give your health care provider a list of all the medicines, herbs, non-prescription drugs, or dietary supplements you use. Also tell them if you smoke, drink alcohol, or use illegal drugs. Some items may interact with your medicine. What should I watch for while using this  medication? Visit your care team for regular checks on your progress. Tell your care team if your symptoms do not start to get better or if they get worse. You may need blood work while you are taking this medication. You may need to eat more foods that contain iron. Talk to your care team. Foods that contain iron include whole grains/cereals, dried fruits, beans, or peas, leafy green vegetables, and organ meats (liver, kidney). What side effects may I notice from receiving this medication? Side effects that you should report to your care team as soon as possible: Allergic reactions-skin rash, itching, hives, swelling of the face, lips, tongue, or throat Increase in blood pressure Low blood pressure-dizziness, feeling faint or lightheaded, blurry vision Shortness of breath Side effects that usually do not require medical attention (report to your care team if they continue or are bothersome): Flushing Headache Joint pain Muscle pain Nausea Pain, redness, or irritation at injection site This list may not describe all possible side effects. Call your doctor for medical advice about side effects. You may report side effects to FDA at 1-800-FDA-1088. Where should I keep my medication? This medication is given in a hospital or clinic. It will not be stored at home. NOTE: This sheet is a summary. It may not cover all possible information. If you have questions about this medicine, talk to your doctor, pharmacist, or health care provider.  2022 Elsevier/Gold Standard (2020-07-01 15:38:05)  

## 2020-12-09 DIAGNOSIS — Z23 Encounter for immunization: Secondary | ICD-10-CM | POA: Diagnosis not present

## 2020-12-11 NOTE — H&P (Signed)
TOTAL KNEE ADMISSION H&P  Patient is being admitted for left total knee arthroplasty.  Subjective:  Chief Complaint: Left knee pain.  HPI: Amy Carroll, 86 y.o. female has a history of pain and functional disability in the left knee due to arthritis and has failed non-surgical conservative treatments for greater than 12 weeks to include NSAID's and/or analgesics, flexibility and strengthening excercises, and activity modification. Onset of symptoms was gradual, starting  several  years ago with gradually worsening course since that time. The patient noted no past surgery on the left knee.  Patient currently rates pain in the left knee at 7 out of 10 with activity. Patient has worsening of pain with activity and weight bearing, pain that interferes with activities of daily living, pain with passive range of motion, and crepitus. Patient has evidence of periarticular osteophytes and joint space narrowing by imaging studies. There is no active infection.  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   Allergic contact dermatitis due to metals 03/17/2018   Presence of right artificial knee joint 04/02/2016   Effusion, right knee 04/02/2016   Osteoarthritis of right knee 05/27/2012    Class: Diagnosis of    Past Medical History:  Diagnosis Date   Anemia    Arthritis    Gait abnormality 06/20/2020   Hypertension     Past Surgical History:  Procedure Laterality Date   ABDOMINAL HYSTERECTOMY     COLONOSCOPY     HEMANGIOMA EXCISION     JOINT REPLACEMENT     TONSILLECTOMY     TOTAL HIP ARTHROPLASTY Right 02/18/2019   Procedure: TOTAL HIP ARTHROPLASTY ANTERIOR APPROACH;  Surgeon: Gaynelle Arabian, MD;  Location: [facility];  Service: Orthopedics;  Laterality: Right;  124min    TOTAL KNEE ARTHROPLASTY Right 05/27/2012   Procedure: TOTAL KNEE ARTHROPLASTY;  Surgeon: Meredith Pel, MD;  Location: [facility];  Service: Orthopedics;  Laterality: Right;  Right Total Knee Arthroplasty   TOTAL KNEE REVISION Right 08/06/2018   Procedure: TOTAL KNEE REVISION;  Surgeon: Gaynelle Arabian, MD;  Location: [facility];  Service: Orthopedics;  Laterality: Right;   TUBAL LIGATION      Prior to Admission medications   Medication Sig Start Date End Date Taking? Authorizing Provider  acetaminophen (TYLENOL) 500 MG tablet Take 500 mg by mouth every 6 (six) hours as needed for moderate pain.   Yes [provider]  amLODipine (NORVASC) 5 MG tablet Take 5 mg by mouth daily.  02/06/16  Yes [provider]  Camphor-Menthol-Methyl Sal (SALONPAS) 3.03-03-08 % PTCH Place 1 patch onto the skin daily as needed (knee pain).   Yes [provider]  cholecalciferol (VITAMIN D3) 25 MCG (1000 UT) tablet Take 1,000 Units by mouth daily.   Yes [provider]  COMBIGAN 0.2-0.5 % ophthalmic solution Place 1 drop into the right eye in the morning and at bedtime. 01/24/18  Yes [provider]  Cyanocobalamin (B-12) 2500 MCG TABS Take 2,500 mcg by mouth daily.   Yes [provider]  dorzolamide (TRUSOPT) 2 % ophthalmic solution Place 1 drop into the right eye 2 (two) times daily.  01/03/18  Yes [provider]  ferrous sulfate 325 (65 FE) MG tablet Take 325 mg by mouth daily with breakfast.   Yes [provider]  latanoprost (XALATAN) 0.005 % ophthalmic solution Place 1 drop into both eyes at bedtime.  01/24/18  Yes [provider]  Multiple Vitamins-Minerals (PRESERVISION AREDS 2 PO) Take 1 capsule by mouth in the morning and at bedtime.   Yes [provider]  olmesartan-hydrochlorothiazide (BENICAR HCT) 40-25 MG tablet Take 1 tablet by mouth daily.   Yes [provider]  HYDROcodone-acetaminophen (NORCO/VICODIN) 5-325 MG  tablet Take 1-2 tablets by mouth every 6 (six) hours as needed for severe pain. Patient not taking: No sig reported 02/19/19   Edmisten, Kristie L, PA  KLOR-CON M20 20 MEQ tablet TAKE 2 TABLETS BY MOUTH DAILY 12/07/20   Brunetta Genera, MD  methocarbamol (ROBAXIN) 500 MG tablet Take 1 tablet (500 mg total) by mouth every 6 (six) hours as needed for muscle spasms. Patient not taking: No sig reported 02/19/19   Edmisten, Ok Anis, PA    No Known Allergies  Social History   Socioeconomic History   Marital status: Married    Spouse name: Shanon Brow   Number of children: Not on file   Years of education: Not on file   Highest education level: Not on file  Occupational History   Not on file  Tobacco Use   Smoking status: Former    Types: Cigarettes    Quit date: 1999    Years since quitting: 23.8   Smokeless tobacco: Never  Vaping Use   Vaping Use: Never used  Substance and Sexual Activity   Alcohol use: Yes    Alcohol/week: 1.0 standard drink    Types: 1 Glasses of wine per week    Comment: daily   Drug use: No   Sexual activity: Not on file  Other Topics Concern   Not on file  Social History Narrative   Lives with husband   Right Handed   Drinks caffeine rarely   Social Determinants of Health   Financial Resource Strain: Not on file  Food Insecurity: Not on file  Transportation Needs: Not on file  Physical Activity: Not on file  Stress: Not on file  Social Connections: Not on file  Intimate Partner Violence: Not on file    Tobacco Use: Medium Risk   Smoking Tobacco Use: Former   Smokeless Tobacco Use: Never   Social History   Substance and Sexual Activity  Alcohol Use Yes   Alcohol/week: 1.0 standard drink   Types: 1 Glasses of wine per week   Comment: daily    Family History  Problem Relation Age of Onset   Cancer Mother    Heart disease Father    Cancer Brother    Asthma Brother     ROS: Constitutional: no fever, no chills, no night sweats, no  significant weight loss Cardiovascular: no chest pain, no palpitations Respiratory: no cough, no shortness of breath, No COPD Gastrointestinal: no vomiting, no nausea Musculoskeletal: no swelling in Joints, Joint Pain Neurologic: no numbness, no tingling, no difficulty with balance   Objective:  Physical Exam: Well nourished and well developed.  General: Alert and oriented x3, cooperative and pleasant, no acute distress.  Head: normocephalic, atraumatic, neck supple.  Eyes: EOMI.  Respiratory: breath sounds clear in all fields, no wheezing, rales, or rhonchi. Cardiovascular: Regular rate and rhythm, no murmurs, gallops or rubs.  Abdomen: non-tender to palpation and soft, normoactive bowel sounds. Musculoskeletal:   Left Knee Exam:   Large effusion present. No swelling present.   The range of motion is: 15 to 125 degrees.  No crepitus on range of motion of the knee.   Significant medial joint line tenderness.   Slight lateral joint line tenderness.   The knee is stable.   Calves soft and nontender. Motor function intact in LE. Strength 5/5 LE bilaterally. Neuro: Distal pulses 2+. Sensation to light touch intact in LE.    Vital signs in last 24 hours:    Imaging Review Radiographs- AP and lateral of the left knee dated 06/2020 demonstrate bone-on-bone arthritis in the medial and patellofemoral compartments with significant erosion on the medial side and with patellofemoral bone-on-bone arthritis.   Assessment/Plan:  End stage arthritis, left knee   The patient history, physical examination, clinical judgment of the provider and imaging studies are consistent with end stage degenerative joint disease of the left knee and total knee arthroplasty is deemed medically necessary. The treatment options including medical management, injection therapy arthroscopy and arthroplasty were discussed at length. The risks and benefits of total knee arthroplasty were presented and reviewed.  The risks due to aseptic loosening, infection, stiffness, patella tracking problems, thromboembolic complications and other imponderables were discussed. The patient acknowledged the explanation, agreed to proceed with the plan and consent was signed. Patient is being admitted for inpatient treatment for surgery, pain control, PT, OT, prophylactic antibiotics, VTE prophylaxis, progressive ambulation and ADLs and discharge planning. The patient is planning to be discharged  home .   Patient's anticipated LOS is less than 2 midnights, meeting these requirements: - Lives within 1 hour of care - Has a competent adult at home to recover with post-op - NO history of  - Chronic pain requiring opioids  - Diabetes  - Coronary Artery Disease  - Heart failure  - Heart attack  - Stroke  - DVT/VTE  - Cardiac arrhythmia  - Respiratory Failure/COPD  - Renal failure  - Anemia  - Advanced Liver disease    Therapy Plans: [facility] Disposition: Home with Husband Planned DVT Prophylaxis: Aspirin 325mg   DME Needed: None PCP: Merrilee Seashore, MD (clearance not received) TXA: IV Allergies: NKDA Anesthesia Concerns: None BMI: 22 Last HgbA1c: N/A  Pharmacy: [facility] [address]   - Patient was instructed on what medications to stop prior to surgery. - Follow-up visit in 2 weeks with Dr. Wynelle Link - Begin physical therapy following surgery - Pre-operative lab work as pre-surgical testing - Prescriptions will be provided in hospital at time of discharge  Fenton Foy, [facility], PA-C Orthopedic Surgery [facility] [place]

## 2020-12-12 ENCOUNTER — Encounter (HOSPITAL_COMMUNITY): Payer: Self-pay | Admitting: Orthopedic Surgery

## 2020-12-12 ENCOUNTER — Encounter (HOSPITAL_COMMUNITY)
Admission: RE | Disposition: A | Discharge status: Home / Self Care | Payer: Self-pay | Source: Home / Self Care | Attending: Orthopedic Surgery

## 2020-12-12 ENCOUNTER — Ambulatory Visit (HOSPITAL_COMMUNITY): Payer: Medicare Other | Admitting: Anesthesiology

## 2020-12-12 ENCOUNTER — Other Ambulatory Visit: Payer: Self-pay

## 2020-12-12 ENCOUNTER — Observation Stay (HOSPITAL_COMMUNITY)
Admission: RE | Admit: 2020-12-12 | Discharge: 2020-12-14 | Disposition: A | Discharge status: Home / Self Care | Payer: Medicare Other | Attending: Orthopedic Surgery | Admitting: Orthopedic Surgery

## 2020-12-12 DIAGNOSIS — Z20822 Contact with and (suspected) exposure to covid-19: Secondary | ICD-10-CM | POA: Insufficient documentation

## 2020-12-12 DIAGNOSIS — I10 Essential (primary) hypertension: Secondary | ICD-10-CM | POA: Diagnosis not present

## 2020-12-12 DIAGNOSIS — M1712 Unilateral primary osteoarthritis, left knee: Secondary | ICD-10-CM | POA: Diagnosis not present

## 2020-12-12 DIAGNOSIS — Z87891 Personal history of nicotine dependence: Secondary | ICD-10-CM | POA: Insufficient documentation

## 2020-12-12 DIAGNOSIS — Z96641 Presence of right artificial hip joint: Secondary | ICD-10-CM | POA: Insufficient documentation

## 2020-12-12 DIAGNOSIS — G8918 Other acute postprocedural pain: Secondary | ICD-10-CM | POA: Diagnosis not present

## 2020-12-12 DIAGNOSIS — D509 Iron deficiency anemia, unspecified: Secondary | ICD-10-CM | POA: Diagnosis not present

## 2020-12-12 HISTORY — PX: TOTAL KNEE ARTHROPLASTY: SHX125

## 2020-12-12 LAB — TYPE AND SCREEN
ABO/RH(D): A POS
Antibody Screen: NEGATIVE

## 2020-12-12 SURGERY — ARTHROPLASTY, KNEE, TOTAL
Anesthesia: Spinal | Site: Knee | Laterality: Left

## 2020-12-12 MED ORDER — SODIUM CHLORIDE 0.9 % IR SOLN
Status: DC | PRN
  Administered 2020-12-12: 1000 mL

## 2020-12-12 MED ORDER — LACTATED RINGERS IV SOLN: INTRAVENOUS | Status: DC

## 2020-12-12 MED ORDER — SODIUM CHLORIDE 0.9 % IV SOLN: INTRAVENOUS | Status: DC

## 2020-12-12 MED ORDER — OLMESARTAN MEDOXOMIL-HCTZ 40-25 MG PO TABS: 1.0000 | ORAL_TABLET | Freq: Every day | ORAL | Status: DC

## 2020-12-12 MED ORDER — ACETAMINOPHEN 10 MG/ML IV SOLN
1000.0000 mg | Freq: Four times a day (QID) | INTRAVENOUS | Status: DC
  Administered 2020-12-12: 1000 mg via INTRAVENOUS
  Filled 2020-12-12: qty 100

## 2020-12-12 MED ORDER — DEXAMETHASONE SODIUM PHOSPHATE 4 MG/ML IJ SOLN
INTRAMUSCULAR | Status: DC | PRN
  Administered 2020-12-12: 8 mg via PERINEURAL

## 2020-12-12 MED ORDER — PHENOL 1.4 % MT LIQD: 1.0000 | OROMUCOSAL | Status: DC | PRN

## 2020-12-12 MED ORDER — HYDROCHLOROTHIAZIDE 25 MG PO TABS
25.0000 mg | ORAL_TABLET | Freq: Every day | ORAL | Status: DC
  Administered 2020-12-13 – 2020-12-14 (×2): 25 mg via ORAL
  Filled 2020-12-12 (×2): qty 1

## 2020-12-12 MED ORDER — BRIMONIDINE TARTRATE 0.2 % OP SOLN
1.0000 [drp] | Freq: Two times a day (BID) | OPHTHALMIC | Status: DC
  Administered 2020-12-12 – 2020-12-14 (×4): 1 [drp] via OPHTHALMIC
  Filled 2020-12-12: qty 5

## 2020-12-12 MED ORDER — POTASSIUM CHLORIDE CRYS ER 20 MEQ PO TBCR
40.0000 meq | EXTENDED_RELEASE_TABLET | Freq: Every day | ORAL | Status: DC
  Administered 2020-12-12 – 2020-12-14 (×3): 40 meq via ORAL
  Filled 2020-12-12 (×3): qty 2

## 2020-12-12 MED ORDER — TIMOLOL MALEATE 0.5 % OP SOLN
1.0000 [drp] | Freq: Two times a day (BID) | OPHTHALMIC | Status: DC
  Administered 2020-12-12 – 2020-12-14 (×4): 1 [drp] via OPHTHALMIC
  Filled 2020-12-12: qty 5

## 2020-12-12 MED ORDER — FENTANYL CITRATE PF 50 MCG/ML IJ SOSY
50.0000 ug | PREFILLED_SYRINGE | INTRAMUSCULAR | Status: DC
  Administered 2020-12-12: 50 ug via INTRAVENOUS
  Filled 2020-12-12: qty 2

## 2020-12-12 MED ORDER — PROPOFOL 1000 MG/100ML IV EMUL
INTRAVENOUS | Status: AC
  Filled 2020-12-12: qty 100

## 2020-12-12 MED ORDER — METHOCARBAMOL 500 MG PO TABS
500.0000 mg | ORAL_TABLET | Freq: Four times a day (QID) | ORAL | Status: DC | PRN
  Administered 2020-12-12 – 2020-12-14 (×3): 500 mg via ORAL
  Filled 2020-12-12 (×3): qty 1

## 2020-12-12 MED ORDER — DEXAMETHASONE SODIUM PHOSPHATE 10 MG/ML IJ SOLN
8.0000 mg | Freq: Once | INTRAMUSCULAR | Status: AC
  Administered 2020-12-12: 8 mg via INTRAVENOUS

## 2020-12-12 MED ORDER — TRANEXAMIC ACID-NACL 1000-0.7 MG/100ML-% IV SOLN
1000.0000 mg | INTRAVENOUS | Status: AC
  Administered 2020-12-12: 1000 mg via INTRAVENOUS
  Filled 2020-12-12: qty 100

## 2020-12-12 MED ORDER — ONDANSETRON HCL 4 MG/2ML IJ SOLN
INTRAMUSCULAR | Status: DC | PRN
  Administered 2020-12-12: 4 mg via INTRAVENOUS

## 2020-12-12 MED ORDER — BUPIVACAINE LIPOSOME 1.3 % IJ SUSP: 20.0000 mL | Freq: Once | INTRAMUSCULAR | Status: DC

## 2020-12-12 MED ORDER — BUPIVACAINE LIPOSOME 1.3 % IJ SUSP
INTRAMUSCULAR | Status: DC | PRN
  Administered 2020-12-12: 20 mL

## 2020-12-12 MED ORDER — CHLORHEXIDINE GLUCONATE CLOTH 2 % EX PADS
6.0000 | MEDICATED_PAD | Freq: Every day | CUTANEOUS | Status: DC
  Administered 2020-12-13: 6 via TOPICAL

## 2020-12-12 MED ORDER — TRAMADOL HCL 50 MG PO TABS
50.0000 mg | ORAL_TABLET | Freq: Four times a day (QID) | ORAL | Status: DC | PRN
  Administered 2020-12-12 – 2020-12-13 (×2): 50 mg via ORAL
  Filled 2020-12-12 (×2): qty 1

## 2020-12-12 MED ORDER — DORZOLAMIDE HCL 2 % OP SOLN
1.0000 [drp] | Freq: Two times a day (BID) | OPHTHALMIC | Status: DC
  Administered 2020-12-12 – 2020-12-14 (×4): 1 [drp] via OPHTHALMIC
  Filled 2020-12-12: qty 10

## 2020-12-12 MED ORDER — FERROUS SULFATE 325 (65 FE) MG PO TABS
325.0000 mg | ORAL_TABLET | Freq: Every day | ORAL | Status: DC
  Administered 2020-12-13 – 2020-12-14 (×2): 325 mg via ORAL
  Filled 2020-12-12 (×2): qty 1

## 2020-12-12 MED ORDER — CEFAZOLIN SODIUM-DEXTROSE 2-4 GM/100ML-% IV SOLN
2.0000 g | Freq: Four times a day (QID) | INTRAVENOUS | Status: AC
  Administered 2020-12-12 (×2): 2 g via INTRAVENOUS
  Filled 2020-12-12 (×2): qty 100

## 2020-12-12 MED ORDER — CEFAZOLIN SODIUM-DEXTROSE 2-4 GM/100ML-% IV SOLN
2.0000 g | INTRAVENOUS | Status: AC
  Administered 2020-12-12: 2 g via INTRAVENOUS
  Filled 2020-12-12: qty 100

## 2020-12-12 MED ORDER — METOCLOPRAMIDE HCL 5 MG/ML IJ SOLN: 5.0000 mg | Freq: Three times a day (TID) | INTRAMUSCULAR | Status: DC | PRN

## 2020-12-12 MED ORDER — ROPIVACAINE HCL 5 MG/ML IJ SOLN
INTRAMUSCULAR | Status: DC | PRN
  Administered 2020-12-12: 30 mL via PERINEURAL

## 2020-12-12 MED ORDER — DEXAMETHASONE SODIUM PHOSPHATE 10 MG/ML IJ SOLN
INTRAMUSCULAR | Status: AC
  Filled 2020-12-12: qty 1

## 2020-12-12 MED ORDER — ONDANSETRON HCL 4 MG/2ML IJ SOLN
INTRAMUSCULAR | Status: AC
  Filled 2020-12-12: qty 2

## 2020-12-12 MED ORDER — PROPOFOL 10 MG/ML IV BOLUS
INTRAVENOUS | Status: DC | PRN
  Administered 2020-12-12: 30 mg via INTRAVENOUS
  Administered 2020-12-12: 20 mg via INTRAVENOUS

## 2020-12-12 MED ORDER — FENTANYL CITRATE (PF) 100 MCG/2ML IJ SOLN
INTRAMUSCULAR | Status: DC | PRN
  Administered 2020-12-12 (×4): 25 ug via INTRAVENOUS

## 2020-12-12 MED ORDER — PROPOFOL 500 MG/50ML IV EMUL
INTRAVENOUS | Status: DC | PRN
  Administered 2020-12-12: 75 ug/kg/min via INTRAVENOUS

## 2020-12-12 MED ORDER — METOCLOPRAMIDE HCL 5 MG PO TABS: 5.0000 mg | ORAL_TABLET | Freq: Three times a day (TID) | ORAL | Status: DC | PRN

## 2020-12-12 MED ORDER — ONDANSETRON HCL 4 MG/2ML IJ SOLN: 4.0000 mg | Freq: Four times a day (QID) | INTRAMUSCULAR | Status: DC | PRN

## 2020-12-12 MED ORDER — SODIUM CHLORIDE (PF) 0.9 % IJ SOLN
INTRAMUSCULAR | Status: AC
  Filled 2020-12-12: qty 10

## 2020-12-12 MED ORDER — STERILE WATER FOR IRRIGATION IR SOLN
Status: DC | PRN
  Administered 2020-12-12: 2000 mL

## 2020-12-12 MED ORDER — ASPIRIN EC 325 MG PO TBEC
325.0000 mg | DELAYED_RELEASE_TABLET | Freq: Two times a day (BID) | ORAL | Status: DC
  Administered 2020-12-13 – 2020-12-14 (×3): 325 mg via ORAL
  Filled 2020-12-12 (×3): qty 1

## 2020-12-12 MED ORDER — MORPHINE SULFATE (PF) 2 MG/ML IV SOLN: 0.5000 mg | INTRAVENOUS | Status: DC | PRN

## 2020-12-12 MED ORDER — HYDROMORPHONE HCL 1 MG/ML IJ SOLN
0.2500 mg | INTRAMUSCULAR | Status: DC | PRN
  Administered 2020-12-12: 0.25 mg via INTRAVENOUS

## 2020-12-12 MED ORDER — IRBESARTAN 150 MG PO TABS
300.0000 mg | ORAL_TABLET | Freq: Every day | ORAL | Status: DC
Start: 2020-12-13 — End: 2020-12-14
  Administered 2020-12-13 – 2020-12-14 (×2): 300 mg via ORAL
  Filled 2020-12-12 (×2): qty 2

## 2020-12-12 MED ORDER — AMLODIPINE BESYLATE 5 MG PO TABS
5.0000 mg | ORAL_TABLET | Freq: Every day | ORAL | Status: DC
  Administered 2020-12-13 – 2020-12-14 (×2): 5 mg via ORAL
  Filled 2020-12-12 (×2): qty 1

## 2020-12-12 MED ORDER — METHOCARBAMOL 500 MG IVPB - SIMPLE MED
INTRAVENOUS | Status: AC
  Administered 2020-12-12: 500 mg via INTRAVENOUS
  Filled 2020-12-12: qty 50

## 2020-12-12 MED ORDER — HYDROCODONE-ACETAMINOPHEN 5-325 MG PO TABS
1.0000 | ORAL_TABLET | ORAL | Status: DC | PRN
  Administered 2020-12-12 – 2020-12-13 (×2): 1 via ORAL
  Filled 2020-12-12: qty 2
  Filled 2020-12-12 (×2): qty 1
  Filled 2020-12-12: qty 2

## 2020-12-12 MED ORDER — BRIMONIDINE TARTRATE-TIMOLOL 0.2-0.5 % OP SOLN
1.0000 [drp] | Freq: Two times a day (BID) | OPHTHALMIC | Status: DC
  Filled 2020-12-12: qty 5

## 2020-12-12 MED ORDER — HYDROMORPHONE HCL 1 MG/ML IJ SOLN
INTRAMUSCULAR | Status: AC
  Administered 2020-12-12: 0.5 mg via INTRAVENOUS
  Filled 2020-12-12: qty 1

## 2020-12-12 MED ORDER — DROPERIDOL 2.5 MG/ML IJ SOLN: 0.6250 mg | Freq: Once | INTRAMUSCULAR | Status: DC | PRN

## 2020-12-12 MED ORDER — POLYETHYLENE GLYCOL 3350 17 G PO PACK: 17.0000 g | PACK | Freq: Every day | ORAL | Status: DC | PRN

## 2020-12-12 MED ORDER — 0.9 % SODIUM CHLORIDE (POUR BTL) OPTIME
TOPICAL | Status: DC | PRN
  Administered 2020-12-12: 1000 mL

## 2020-12-12 MED ORDER — HYDROCODONE-ACETAMINOPHEN 7.5-325 MG PO TABS: 1.0000 | ORAL_TABLET | ORAL | Status: DC | PRN

## 2020-12-12 MED ORDER — DIPHENHYDRAMINE HCL 12.5 MG/5ML PO ELIX: 12.5000 mg | ORAL_SOLUTION | ORAL | Status: DC | PRN

## 2020-12-12 MED ORDER — CLONIDINE HCL (ANALGESIA) 100 MCG/ML EP SOLN
EPIDURAL | Status: DC | PRN
  Administered 2020-12-12: 80 ug

## 2020-12-12 MED ORDER — FLEET ENEMA 7-19 GM/118ML RE ENEM: 1.0000 | ENEMA | Freq: Once | RECTAL | Status: DC | PRN

## 2020-12-12 MED ORDER — SODIUM CHLORIDE (PF) 0.9 % IJ SOLN
INTRAMUSCULAR | Status: DC | PRN
  Administered 2020-12-12: 60 mL

## 2020-12-12 MED ORDER — ONDANSETRON HCL 4 MG PO TABS: 4.0000 mg | ORAL_TABLET | Freq: Four times a day (QID) | ORAL | Status: DC | PRN

## 2020-12-12 MED ORDER — FENTANYL CITRATE (PF) 100 MCG/2ML IJ SOLN
INTRAMUSCULAR | Status: AC
  Filled 2020-12-12: qty 2

## 2020-12-12 MED ORDER — CHLORHEXIDINE GLUCONATE 0.12 % MT SOLN
15.0000 mL | Freq: Once | OROMUCOSAL | Status: AC
  Administered 2020-12-12: 15 mL via OROMUCOSAL

## 2020-12-12 MED ORDER — DOCUSATE SODIUM 100 MG PO CAPS
100.0000 mg | ORAL_CAPSULE | Freq: Two times a day (BID) | ORAL | Status: DC
  Administered 2020-12-12 – 2020-12-14 (×4): 100 mg via ORAL
  Filled 2020-12-12 (×4): qty 1

## 2020-12-12 MED ORDER — METHOCARBAMOL 500 MG IVPB - SIMPLE MED
500.0000 mg | Freq: Four times a day (QID) | INTRAVENOUS | Status: DC | PRN
  Filled 2020-12-12: qty 50

## 2020-12-12 MED ORDER — BISACODYL 10 MG RE SUPP: 10.0000 mg | Freq: Every day | RECTAL | Status: DC | PRN

## 2020-12-12 MED ORDER — MENTHOL 3 MG MT LOZG: 1.0000 | LOZENGE | OROMUCOSAL | Status: DC | PRN

## 2020-12-12 MED ORDER — POVIDONE-IODINE 10 % EX SWAB
2.0000 "application " | Freq: Once | CUTANEOUS | Status: AC
  Administered 2020-12-12: 2 via TOPICAL

## 2020-12-12 MED ORDER — BUPIVACAINE IN DEXTROSE 0.75-8.25 % IT SOLN
INTRATHECAL | Status: DC | PRN
  Administered 2020-12-12: 1.4 mL via INTRATHECAL

## 2020-12-12 MED ORDER — DEXAMETHASONE SODIUM PHOSPHATE 10 MG/ML IJ SOLN
10.0000 mg | Freq: Once | INTRAMUSCULAR | Status: AC
  Administered 2020-12-13: 10 mg via INTRAVENOUS
  Filled 2020-12-12: qty 1

## 2020-12-12 MED ORDER — ACETAMINOPHEN 325 MG PO TABS
325.0000 mg | ORAL_TABLET | Freq: Four times a day (QID) | ORAL | Status: DC | PRN
  Administered 2020-12-13 – 2020-12-14 (×2): 650 mg via ORAL
  Filled 2020-12-12 (×2): qty 2

## 2020-12-12 MED ORDER — ORAL CARE MOUTH RINSE: 15.0000 mL | Freq: Once | OROMUCOSAL | Status: AC

## 2020-12-12 MED ORDER — LATANOPROST 0.005 % OP SOLN
1.0000 [drp] | Freq: Every day | OPHTHALMIC | Status: DC
  Administered 2020-12-12 – 2020-12-13 (×2): 1 [drp] via OPHTHALMIC
  Filled 2020-12-12: qty 2.5

## 2020-12-12 MED ORDER — BUPIVACAINE LIPOSOME 1.3 % IJ SUSP
INTRAMUSCULAR | Status: AC
  Filled 2020-12-12: qty 20

## 2020-12-12 SURGICAL SUPPLY — 53 items
AUGMENT PFC SIG RP SZ2.5 12.5M (Knees) ×1 IMPLANT
BAG COUNTER SPONGE SURGICOUNT (BAG) IMPLANT
BAG SPEC THK2 15X12 ZIP CLS (MISCELLANEOUS) ×1
BAG SPNG CNTER NS LX DISP (BAG)
BAG ZIPLOCK 12X15 (MISCELLANEOUS) ×2 IMPLANT
BLADE SAG 18X100X1.27 (BLADE) ×2 IMPLANT
BLADE SAW SGTL 11.0X1.19X90.0M (BLADE) ×2 IMPLANT
BNDG ELASTIC 6X5.8 VLCR STR LF (GAUZE/BANDAGES/DRESSINGS) ×2 IMPLANT
BOWL SMART MIX CTS (DISPOSABLE) ×2 IMPLANT
CEMENT HV SMART SET (Cement) ×4 IMPLANT
CEMENT TIBIA MBT SIZE 2.5 (Knees) ×1 IMPLANT
COVER SURGICAL LIGHT HANDLE (MISCELLANEOUS) ×2 IMPLANT
CUFF TOURN SGL QUICK 34 (TOURNIQUET CUFF) ×2
CUFF TRNQT CYL 34X4.125X (TOURNIQUET CUFF) ×1 IMPLANT
DECANTER SPIKE VIAL GLASS SM (MISCELLANEOUS) ×2 IMPLANT
DRAPE INCISE IOBAN 66X45 STRL (DRAPES) ×2 IMPLANT
DRAPE U-SHAPE 47X51 STRL (DRAPES) ×2 IMPLANT
DRSG AQUACEL AG ADV 3.5X10 (GAUZE/BANDAGES/DRESSINGS) ×2 IMPLANT
DURAPREP 26ML APPLICATOR (WOUND CARE) ×2 IMPLANT
ELECT REM PT RETURN 15FT ADLT (MISCELLANEOUS) ×2 IMPLANT
FEMUR SIGMA PS SZ 2.5 L (Knees) ×2 IMPLANT
GLOVE SRG 8 PF TXTR STRL LF DI (GLOVE) ×1 IMPLANT
GLOVE SURG ENC MOIS LTX SZ6.5 (GLOVE) ×2 IMPLANT
GLOVE SURG ENC MOIS LTX SZ8 (GLOVE) ×4 IMPLANT
GLOVE SURG UNDER POLY LF SZ7 (GLOVE) ×2 IMPLANT
GLOVE SURG UNDER POLY LF SZ8 (GLOVE) ×2
GLOVE SURG UNDER POLY LF SZ8.5 (GLOVE) ×2 IMPLANT
GOWN STRL REUS W/TWL LRG LVL3 (GOWN DISPOSABLE) ×4 IMPLANT
GOWN STRL REUS W/TWL XL LVL3 (GOWN DISPOSABLE) ×2 IMPLANT
HANDPIECE INTERPULSE COAX TIP (DISPOSABLE) ×2
HOLDER FOLEY CATH W/STRAP (MISCELLANEOUS) IMPLANT
IMMOBILIZER KNEE 20 (SOFTGOODS) ×2
IMMOBILIZER KNEE 20 THIGH 36 (SOFTGOODS) ×1 IMPLANT
KIT TURNOVER KIT A (KITS) ×2 IMPLANT
MANIFOLD NEPTUNE II (INSTRUMENTS) ×2 IMPLANT
NS IRRIG 1000ML POUR BTL (IV SOLUTION) ×2 IMPLANT
PACK TOTAL KNEE CUSTOM (KITS) ×2 IMPLANT
PADDING CAST COTTON 6X4 STRL (CAST SUPPLIES) ×2 IMPLANT
PATELLA DOME PFC 35MM (Knees) ×2 IMPLANT
PFC SIGMA RP STB SZ 2.5 12.5M (Knees) ×2 IMPLANT
PROTECTOR NERVE ULNAR (MISCELLANEOUS) ×2 IMPLANT
SET HNDPC FAN SPRY TIP SCT (DISPOSABLE) ×1 IMPLANT
STRIP CLOSURE SKIN 1/2X4 (GAUZE/BANDAGES/DRESSINGS) ×4 IMPLANT
SUT MNCRL AB 4-0 PS2 18 (SUTURE) ×2 IMPLANT
SUT STRATAFIX 0 PDS 27 VIOLET (SUTURE) ×2
SUT VIC AB 2-0 CT1 27 (SUTURE) ×6
SUT VIC AB 2-0 CT1 TAPERPNT 27 (SUTURE) ×3 IMPLANT
SUTURE STRATFX 0 PDS 27 VIOLET (SUTURE) ×1 IMPLANT
TIBIA MBT CEMENT SIZE 2.5 (Knees) ×2 IMPLANT
TRAY FOLEY MTR SLVR 16FR STAT (SET/KITS/TRAYS/PACK) ×2 IMPLANT
TUBE SUCTION HIGH CAP CLEAR NV (SUCTIONS) ×2 IMPLANT
WATER STERILE IRR 1000ML POUR (IV SOLUTION) ×4 IMPLANT
WRAP KNEE MAXI GEL POST OP (GAUZE/BANDAGES/DRESSINGS) ×2 IMPLANT

## 2020-12-12 NOTE — Anesthesia Procedure Notes (Signed)
Anesthesia Regional Block: Adductor canal block   Pre-Anesthetic Checklist: , timeout performed,  Correct Patient, Correct Site, Correct Laterality,  Correct Procedure, Correct Position, site marked,  Risks and benefits discussed,  Surgical consent,  Pre-op evaluation,  At surgeon's request and post-op pain management  Laterality: Lower and Left  Prep: chloraprep       Needles:  Injection technique: Single-shot  Needle Type: Stimiplex     Needle Length: 9cm  Needle Gauge: 21     Additional Needles:   Procedures:,,,, ultrasound used (permanent image in chart),,    Narrative:  Start time: 12/12/2020 8:24 AM End time: 12/12/2020 8:45 AM Injection made incrementally with aspirations every 5 mL.  Performed by: Personally  Anesthesiologist: Nolon Nations, MD  Additional Notes: BP cuff, EKG monitors applied. Sedation begun. Artery and nerve location verified with ultrasound. Anesthetic injected incrementally (79ml), slowly, and after negative aspirations under direct u/s guidance. Good fascial/perineural spread. Tolerated well.

## 2020-12-12 NOTE — Op Note (Signed)
OPERATIVE REPORT-TOTAL KNEE ARTHROPLASTY   Pre-operative diagnosis- Osteoarthritis  Left knee(s)  Post-operative diagnosis- Osteoarthritis Left knee(s)  Procedure-  Left  Total Knee Arthroplasty  Surgeon- Amy Plover. Kashia Brossard, MD  Assistant- Fenton Foy, PA-C   Anesthesia-   Adductor canal block and spinal  EBL- 25 ml   Drains None  Tourniquet time-  Total Tourniquet Time Documented: Thigh (Right) - 34 minutes Total: Thigh (Right) - 34 minutes     Complications- None  Condition-PACU - hemodynamically stable.   Brief Clinical Note  Amy Carroll is a 86 y.o. year old female with end stage OA of her left knee with progressively worsening pain and dysfunction. She has constant pain, with activity and at rest and significant functional deficits with difficulties even with ADLs. She has had extensive non-op management including analgesics, injections of cortisone and viscosupplements, and home exercise program, but remains in significant pain with significant dysfunction. Radiographs show bone on bone arthritis medial and patellofemoral. She presents now for left Total Knee Arthroplasty.     Procedure in detail---   The patient is brought into the operating room and positioned supine on the operating table. After successful administration of  Adductor canal block and spinal,   a tourniquet is placed high on the  Left thigh(s) and the lower extremity is prepped and draped in the usual sterile fashion. Time out is performed by the operating team and then the  Left lower extremity is wrapped in Esmarch, knee flexed and the tourniquet inflated to 300 mmHg.       A midline incision is made with a ten blade through the subcutaneous tissue to the level of the extensor mechanism. A fresh blade is used to make a medial parapatellar arthrotomy. Soft tissue over the proximal medial tibia is subperiosteally elevated to the joint line with a knife and into the semimembranosus bursa with a  Cobb elevator. Soft tissue over the proximal lateral tibia is elevated with attention being paid to avoiding the patellar tendon on the tibial tubercle. The patella is everted, knee flexed 90 degrees and the ACL and PCL are removed. Findings are bone on bone medial and patellofemoral with large global osteophytes and massive synovitis       The drill is used to create a starting hole in the distal femur and the canal is thoroughly irrigated with sterile saline to remove the fatty contents. The 5 degree Left  valgus alignment guide is placed into the femoral canal and the distal femoral cutting block is pinned to remove 10 mm off the distal femur. Resection is made with an oscillating saw.      The tibia is subluxed forward and the menisci are removed. The extramedullary alignment guide is placed referencing proximally at the medial aspect of the tibial tubercle and distally along the second metatarsal axis and tibial crest. The block is pinned to remove 27mm off the more deficient medial  side. Resection is made with an oscillating saw. Size 2.5is the most appropriate size for the tibia and the proximal tibia is prepared with the modular drill and keel punch for that size.      The femoral sizing guide is placed and size 2.5 is most appropriate. Rotation is marked off the epicondylar axis and confirmed by creating a rectangular flexion gap at 90 degrees. The size 2.5 cutting block is pinned in this rotation and the anterior, posterior and chamfer cuts are made with the oscillating saw. The intercondylar block is then placed and  that cut is made.      Trial size 2.5 tibial component, trial size 2.5 posterior stabilized femur and a 12.5  mm posterior stabilized rotating platform insert trial is placed. Full extension is achieved with excellent varus/valgus and anterior/posterior balance throughout full range of motion. The patella is everted and thickness measured to be 22  mm. Free hand resection is taken to 12  mm, a 35 template is placed, lug holes are drilled, trial patella is placed, and it tracks normally. Osteophytes are removed off the posterior femur with the trial in place. All trials are removed and the cut bone surfaces prepared with pulsatile lavage. Cement is mixed and once ready for implantation, the size 2.5 tibial implant, size  2.5 posterior stabilized femoral component, and the size 35 patella are cemented in place and the patella is held with the clamp. The trial insert is placed and the knee held in full extension. The Exparel (20 ml mixed with 60 ml saline) is injected into the extensor mechanism, posterior capsule, medial and lateral gutters and subcutaneous tissues.  All extruded cement is removed and once the cement is hard the permanent 12.5 mm posterior stabilized rotating platform insert is placed into the tibial tray.      The wound is copiously irrigated with saline solution and the extensor mechanism closed with # 0 Stratofix suture. The tourniquet is released for a total tourniquet time of 34  minutes. Flexion against gravity is 140 degrees and the patella tracks normally. Subcutaneous tissue is closed with 2.0 vicryl and subcuticular with running 4.0 Monocryl. The incision is cleaned and dried and steri-strips and a bulky sterile dressing are applied. The limb is placed into a knee immobilizer and the patient is awakened and transported to recovery in stable condition.      Please note that a surgical assistant was a medical necessity for this procedure in order to perform it in a safe and expeditious manner. Surgical assistant was necessary to retract the ligaments and vital neurovascular structures to prevent injury to them and also necessary for proper positioning of the limb to allow for anatomic placement of the prosthesis.   Amy Plover Serina Nichter, MD    12/12/2020, 10:28 AM

## 2020-12-12 NOTE — Transfer of Care (Signed)
Immediate Anesthesia Transfer of Care Note  Patient: Amy Carroll  Procedure(s) Performed: TOTAL KNEE ARTHROPLASTY (Left: Knee)  Patient Location: PACU  Anesthesia Type:Regional and Spinal  Level of Consciousness: awake, alert  and oriented  Airway & Oxygen Therapy: Patient Spontanous Breathing and Patient connected to face mask oxygen  Post-op Assessment: Report given to RN and Post -op Vital signs reviewed and stable  Post vital signs: Reviewed and stable  Last Vitals:  Vitals Value Taken Time  BP    Temp    Pulse 83 12/12/20 1059  Resp 15 12/12/20 1059  SpO2 97 % 12/12/20 1059  Vitals shown include unvalidated device data.  Last Pain:  Vitals:   12/12/20 0715  TempSrc:   PainSc: 0-No pain      Patients Stated Pain Goal: 4 (24/17/53 0104)  Complications: No notable events documented.

## 2020-12-12 NOTE — Anesthesia Preprocedure Evaluation (Addendum)
Anesthesia Evaluation  Patient identified by MRN, date of birth, ID band Patient awake    Reviewed: Allergy & Precautions, NPO status , Patient's Chart, lab work & pertinent test results  Airway Mallampati: II  TM Distance: >3 FB Neck ROM: Full    Dental no notable dental hx. (+) Teeth Intact   Pulmonary former smoker,    Pulmonary exam normal breath sounds clear to auscultation       Cardiovascular hypertension, Pt. on medications Normal cardiovascular exam Rhythm:Regular Rate:Normal     Neuro/Psych negative neurological ROS  negative psych ROS   GI/Hepatic negative GI ROS, Neg liver ROS,   Endo/Other  negative endocrine ROS  Renal/GU negative Renal ROS     Musculoskeletal  (+) Arthritis , Osteoarthritis,    Abdominal Normal abdominal exam  (+)   Peds  Hematology negative hematology ROS (+) anemia ,   Anesthesia Other Findings   Reproductive/Obstetrics                            Anesthesia Physical  Anesthesia Plan  ASA: II  Anesthesia Plan: Spinal   Post-op Pain Management:  Regional for Post-op pain   Induction:   PONV Risk Score and Plan: 2 and Ondansetron, Dexamethasone, Propofol infusion, TIVA and Treatment may vary due to age or medical condition  Airway Management Planned: Simple Face Mask, Nasal Cannula and Natural Airway  Additional Equipment: None  Intra-op Plan:   Post-operative Plan:   Informed Consent: I have reviewed the patients History and Physical, chart, labs and discussed the procedure including the risks, benefits and alternatives for the proposed anesthesia with the patient or authorized representative who has indicated his/her understanding and acceptance.     Dental advisory given  Plan Discussed with: CRNA  Anesthesia Plan Comments:        Anesthesia Quick Evaluation

## 2020-12-12 NOTE — Progress Notes (Signed)
Orthopedic Tech Progress Note Patient Details:  Amy Carroll Jan 31, 1935 [id number]  CPM Left Knee CPM Left Knee: On Left Knee Flexion (Degrees): 40 Left Knee Extension (Degrees): 10  Post Interventions Patient Tolerated: Well Instructions Provided: Care of device, Adjustment of device  Amy Carroll 12/12/2020, 11:08 AM

## 2020-12-12 NOTE — Care Plan (Signed)
Ortho Bundle Case Management Note  Patient Details  Name: Amy Carroll MRN: [id number] Date of Birth: 23-Jul-1934                  L TKA on 12-12-20 DCP: [facility] then home to 1 story home at [place] DME: No needs. Has a RW and elevated toilet with grab bar.   DME Arranged:  N/A DME Agency:     HH Arranged:    HH Agency:     Additional Comments: Please contact me with any questions of if this plan should need to change.  Marianne Sofia, RN,CCM [facility]  [phone] 12/12/2020, 10:11 AM

## 2020-12-12 NOTE — Discharge Instructions (Signed)
 Frank Aluisio, MD Total Joint Specialist [facility] [address] [place], [place] [address] [phone]  TOTAL KNEE REPLACEMENT POSTOPERATIVE DIRECTIONS    Knee Rehabilitation, Guidelines Following Surgery  Results after knee surgery are often greatly improved when you follow the exercise, range of motion and muscle strengthening exercises prescribed by your doctor. Safety measures are also important to protect the knee from further injury. If any of these exercises cause you to have increased pain or swelling in your knee joint, decrease the amount until you are comfortable again and slowly increase them. If you have problems or questions, call your caregiver or physical therapist for advice.   BLOOD CLOT PREVENTION Take a 325 mg Aspirin two times a day for three weeks following surgery. Then take an 81 mg Aspirin once a day for three weeks. Then discontinue Aspirin. You may resume your vitamins/supplements upon discharge from the hospital. Do not take any NSAIDs (Advil, Aleve, Ibuprofen, Meloxicam, etc.) until you have discontinued the 325 mg Aspirin.  HOME CARE INSTRUCTIONS  Remove items at home which could result in a fall. This includes throw rugs or furniture in walking pathways.  ICE to the affected knee as much as tolerated. Icing helps control swelling. If the swelling is well controlled you will be more comfortable and rehab easier. Continue to use ice on the knee for pain and swelling from surgery. You may notice swelling that will progress down to the foot and ankle. This is normal after surgery. Elevate the leg when you are not up walking on it.    Continue to use the breathing machine which will help keep your temperature down. It is common for your temperature to cycle up and down following surgery, especially at night when you are not up moving around and exerting yourself. The breathing machine keeps your lungs expanded and your temperature  down. Do not place pillow under the operative knee, focus on keeping the knee straight while resting  DIET You may resume your previous home diet once you are discharged from the hospital.  DRESSING / WOUND CARE / SHOWERING Keep your bulky bandage on for 2 days. On the third post-operative day you may remove the Ace bandage and gauze. There is a waterproof adhesive bandage on your skin which will stay in place until your first follow-up appointment. Once you remove this you will not need to place another bandage You may begin showering 3 days following surgery, but do not submerge the incision under water.  ACTIVITY For the first 5 days, the key is rest and control of pain and swelling Do your home exercises twice a day starting on post-operative day 3. On the days you go to physical therapy, just do the home exercises once that day. You should rest, ice and elevate the leg for 50 minutes out of every hour. Get up and walk/stretch for 10 minutes per hour. After 5 days you can increase your activity slowly as tolerated. Walk with your walker as instructed. Use the walker until you are comfortable transitioning to a cane. Walk with the cane in the opposite hand of the operative leg. You may discontinue the cane once you are comfortable and walking steadily. Avoid periods of inactivity such as sitting longer than an hour when not asleep. This helps prevent blood clots.  You may discontinue the knee immobilizer once you are able to perform a straight leg raise while lying down. You may resume a sexual relationship in one month   or when given the OK by your doctor.  You may return to work once you are cleared by your doctor.  Do not drive a car for 6 weeks or until released by your surgeon.  Do not drive while taking narcotics.  TED HOSE STOCKINGS Wear the elastic stockings on both legs for three weeks following surgery during the day. You may remove them at night for sleeping.  WEIGHT  BEARING Weight bearing as tolerated with assist device (walker, cane, etc) as directed, use it as long as suggested by your surgeon or therapist, typically at least 4-6 weeks.  POSTOPERATIVE CONSTIPATION PROTOCOL Constipation - defined medically as fewer than three stools per week and severe constipation as less than one stool per week.  One of the most common issues patients have following surgery is constipation.  Even if you have a regular bowel pattern at home, your normal regimen is likely to be disrupted due to multiple reasons following surgery.  Combination of anesthesia, postoperative narcotics, change in appetite and fluid intake all can affect your bowels.  In order to avoid complications following surgery, here are some recommendations in order to help you during your recovery period.  Colace (docusate) - Pick up an over-the-counter form of Colace or another stool softener and take twice a day as long as you are requiring postoperative pain medications.  Take with a full glass of water daily.  If you experience loose stools or diarrhea, hold the colace until you stool forms back up. If your symptoms do not get better within 1 week or if they get worse, check with your doctor. Dulcolax (bisacodyl) - Pick up over-the-counter and take as directed by the product packaging as needed to assist with the movement of your bowels.  Take with a full glass of water.  Use this product as needed if not relieved by Colace only.  MiraLax (polyethylene glycol) - Pick up over-the-counter to have on hand. MiraLax is a solution that will increase the amount of water in your bowels to assist with bowel movements.  Take as directed and can mix with a glass of water, juice, soda, coffee, or tea. Take if you go more than two days without a movement. Do not use MiraLax more than once per day. Call your doctor if you are still constipated or irregular after using this medication for 7 days in a row.  If you continue  to have problems with postoperative constipation, please contact the office for further assistance and recommendations.  If you experience "the worst abdominal pain ever" or develop nausea or vomiting, please contact the office immediatly for further recommendations for treatment.  ITCHING If you experience itching with your medications, try taking only a single pain pill, or even half a pain pill at a time.  You can also use Benadryl over the counter for itching or also to help with sleep.   MEDICATIONS See your medication summary on the "After Visit Summary" that the nursing staff will review with you prior to discharge.  You may have some home medications which will be placed on hold until you complete the course of blood thinner medication.  It is important for you to complete the blood thinner medication as prescribed by your surgeon.  Continue your approved medications as instructed at time of discharge.  PRECAUTIONS If you experience chest pain or shortness of breath - call 911 immediately for transfer to the hospital emergency department.  If you develop a fever greater that 101 F,   purulent drainage from wound, increased redness or drainage from wound, foul odor from the wound/dressing, or calf pain - CONTACT YOUR SURGEON.                                                   FOLLOW-UP APPOINTMENTS Make sure you keep all of your appointments after your operation with your surgeon and caregivers. You should call the office at the above phone number and make an appointment for approximately two weeks after the date of your surgery or on the date instructed by your surgeon outlined in the "After Visit Summary".  RANGE OF MOTION AND STRENGTHENING EXERCISES  Rehabilitation of the knee is important following a knee injury or an operation. After just a few days of immobilization, the muscles of the thigh which control the knee become weakened and shrink (atrophy). Knee exercises are designed to build up  the tone and strength of the thigh muscles and to improve knee motion. Often times heat used for twenty to thirty minutes before working out will loosen up your tissues and help with improving the range of motion but do not use heat for the first two weeks following surgery. These exercises can be done on a training (exercise) mat, on the floor, on a table or on a bed. Use what ever works the best and is most comfortable for you Knee exercises include:  Leg Lifts - While your knee is still immobilized in a splint or cast, you can do straight leg raises. Lift the leg to 60 degrees, hold for 3 sec, and slowly lower the leg. Repeat 10-20 times 2-3 times daily. Perform this exercise against resistance later as your knee gets better.  Quad and Hamstring Sets - Tighten up the muscle on the front of the thigh (Quad) and hold for 5-10 sec. Repeat this 10-20 times hourly. Hamstring sets are done by pushing the foot backward against an object and holding for 5-10 sec. Repeat as with quad sets.  Leg Slides: Lying on your back, slowly slide your foot toward your buttocks, bending your knee up off the floor (only go as far as is comfortable). Then slowly slide your foot back down until your leg is flat on the floor again. Angel Wings: Lying on your back spread your legs to the side as far apart as you can without causing discomfort.  A rehabilitation program following serious knee injuries can speed recovery and prevent re-injury in the future due to weakened muscles. Contact your doctor or a physical therapist for more information on knee rehabilitation.   POST-OPERATIVE OPIOID TAPER INSTRUCTIONS: It is important to wean off of your opioid medication as soon as possible. If you do not need pain medication after your surgery it is ok to stop day one. Opioids include: Codeine, Hydrocodone(Norco, Vicodin), Oxycodone(Percocet, oxycontin) and hydromorphone amongst others.  Long term and even short term use of opiods can  cause: Increased pain response Dependence Constipation Depression Respiratory depression And more.  Withdrawal symptoms can include Flu like symptoms Nausea, vomiting And more Techniques to manage these symptoms Hydrate well Eat regular healthy meals Stay active Use relaxation techniques(deep breathing, meditating, yoga) Do Not substitute Alcohol to help with tapering If you have been on opioids for less than two weeks and do not have pain than it is ok to stop all together.  Plan   to wean off of opioids This plan should start within one week post op of your joint replacement. Maintain the same interval or time between taking each dose and first decrease the dose.  Cut the total daily intake of opioids by one tablet each day Next start to increase the time between doses. The last dose that should be eliminated is the evening dose.   IF YOU ARE TRANSFERRED TO A SKILLED REHAB FACILITY If the patient is transferred to a skilled rehab facility following release from the hospital, a list of the current medications will be sent to the facility for the patient to continue.  When discharged from the skilled rehab facility, please have the facility set up the patient's Home Health Physical Therapy prior to being released. Also, the skilled facility will be responsible for providing the patient with their medications at time of release from the facility to include their pain medication, the muscle relaxants, and their blood thinner medication. If the patient is still at the rehab facility at time of the two week follow up appointment, the skilled rehab facility will also need to assist the patient in arranging follow up appointment in our office and any transportation needs.  MAKE SURE YOU:  Understand these instructions.  Get help right away if you are not doing well or get worse.   DENTAL ANTIBIOTICS:  In most cases prophylactic antibiotics for Dental procdeures after total joint surgery are  not necessary.  Exceptions are as follows:  1. History of prior total joint infection  2. Severely immunocompromised (Organ Transplant, cancer chemotherapy, Rheumatoid biologic meds such as Humera)  3. Poorly controlled diabetes (A1C &gt; 8.0, blood glucose over 200)  If you have one of these conditions, contact your surgeon for an antibiotic prescription, prior to your dental procedure.    Pick up stool softner and laxative for home use following surgery while on pain medications. Do not submerge incision under water. Please use good hand washing techniques while changing dressing each day. May shower starting three days after surgery. Please use a clean towel to pat the incision dry following showers. Continue to use ice for pain and swelling after surgery. Do not use any lotions or creams on the incision until instructed by your surgeon.  

## 2020-12-12 NOTE — Anesthesia Postprocedure Evaluation (Signed)
Anesthesia Post Note  Patient: Amy Carroll  Procedure(s) Performed: TOTAL KNEE ARTHROPLASTY (Left: Knee)     Patient location during evaluation: PACU Anesthesia Type: Spinal Level of consciousness: awake and alert Pain management: pain level controlled Vital Signs Assessment: post-procedure vital signs reviewed and stable Respiratory status: spontaneous breathing Cardiovascular status: stable Anesthetic complications: no   No notable events documented.  Last Vitals:  Vitals:   12/12/20 1430 12/12/20 1712  BP: 120/61 104/67  Pulse: 83 75  Resp: 18 16  Temp: 36.6 C 36.4 C  SpO2: 97% 97%    Last Pain:  Vitals:   12/12/20 1712  TempSrc: Oral  PainSc:                  Nolon Nations

## 2020-12-12 NOTE — Progress Notes (Signed)
Assisted Dr. Lissa Hoard with left, ultrasound guided, adductor canal block. Side rails up, monitors on throughout procedure. See vital signs in flow sheet. Tolerated Procedure well.

## 2020-12-12 NOTE — Evaluation (Signed)
Physical Therapy Evaluation Patient Details Name: Amy Carroll MRN: [id number] DOB: Jul 19, 1934 Today's Date: 12/12/2020  History of Present Illness  Patient is 86 y.o. female s/p LT TKA on 12/12/20 with PMH significant for OA, HTN, anemia, Rt TKA and revision, Rt THA.  Clinical Impression  Amy Carroll is a 86 y.o. female POD 0 s/p Lt TKA. Patient reports independence with mobility at baseline. Patient is now limited by functional impairments (see PT problem list below) and requires Mod Assist for bed mobility and transfers with RW. Patient was limited by Lt quad weakness secondary to local block and knee immobilizer required to prevent buckling in stance. Patient instructed in exercise to facilitate circulation to manage edema and reduce risk of DVT. Patient will benefit from continued skilled PT interventions to address impairments and progress towards PLOF. Acute PT will follow to progress mobility and stair training in preparation for safe discharge home.        Recommendations for follow up therapy are one component of a multi-disciplinary discharge planning process, led by the attending physician.  Recommendations may be updated based on patient status, additional functional criteria and insurance authorization.  Follow Up Recommendations SNF    Equipment Recommendations  None recommended by PT    Recommendations for Other Services       Precautions / Restrictions Precautions Precautions: Fall Restrictions Weight Bearing Restrictions: No LLE Weight Bearing: Weight bearing as tolerated      Mobility  Bed Mobility Overal bed mobility: Needs Assistance Bed Mobility: Supine to Sit     Supine to sit: Min assist;HOB elevated     General bed mobility comments: cues to use bed rail, assist for Lt LE to move off EOB in brace.    Transfers Overall transfer level: Needs assistance Equipment used: Rolling walker (2 wheeled) Transfers: Sit to/from [employer] Sit to Stand: Mod assist;From elevated surface Stand pivot transfers: Mod assist       General transfer comment: cues to hand placement for power up and assist to prevent buckling at Lt knee in immobilizer. pt with limited UE strength and unable to offweight t LE enough to prevent buckling. Mod assist to manage walker progression to step bed>chair.  Ambulation/Gait                Stairs            Wheelchair Mobility    Modified Rankin (Stroke Patients Only)       Balance Overall balance assessment: Needs assistance Sitting-balance support: Feet supported Sitting balance-Leahy Scale: Good     Standing balance support: Bilateral upper extremity supported;During functional activity Standing balance-Leahy Scale: Poor                               Pertinent Vitals/Pain Pain Assessment: 0-10 Pain Score: 0-No pain Pain Location: Lt kne Pain Descriptors / Indicators: Aching;Discomfort Pain Intervention(s): Limited activity within patient's tolerance;Monitored during session;Repositioned;Ice applied    Home Living Family/patient expects to be discharged to:: Skilled nursing facility [facility]) Living Arrangements: Spouse/significant other Available Help at Discharge: Family Type of Home: House (House at [facility] [facility]) Home Access: Level entry     Home Layout: One level Home Equipment: Shower seat - built in;Walker - 2 wheels;Walker - 4 wheels;Cane - single point;Grab bars - tub/shower;Grab bars - toilet      Prior Function Level of Independence: Independent with assistive device(s)  Hand Dominance   Dominant Hand: Right    Extremity/Trunk Assessment   Upper Extremity Assessment Upper Extremity Assessment: Overall WFL for tasks assessed    Lower Extremity Assessment Lower Extremity Assessment: LLE deficits/detail LLE Deficits / Details: pt with some quad activation but significat extensor lag with SLR, knee  immobilizer donned for mobility LLE: Unable to fully assess due to immobilization LLE Sensation: WNL LLE Coordination: decreased gross motor    Cervical / Trunk Assessment Cervical / Trunk Assessment: Normal  Communication   Communication: No difficulties  Cognition Arousal/Alertness: Awake/alert Behavior During Therapy: WFL for tasks assessed/performed Overall Cognitive Status: Within Functional Limits for tasks assessed                                        General Comments      Exercises Total Joint Exercises Ankle Circles/Pumps: AROM;Both;20 reps;Seated   Assessment/Plan    PT Assessment Patient needs continued PT services  PT Problem List Decreased range of motion;Decreased strength;Decreased activity tolerance;Decreased balance;Decreased mobility;Decreased knowledge of use of DME;Decreased knowledge of precautions       PT Treatment Interventions Gait training;DME instruction;Stair training;Functional mobility training;Balance training;Therapeutic exercise;Therapeutic activities;Patient/family education    PT Goals (Current goals can be found in the Care Plan section)  Acute Rehab PT Goals Patient Stated Goal: get recovered at rehab PT Goal Formulation: With patient/family Time For Goal Achievement: 12/19/20 Potential to Achieve Goals: Good    Frequency 7X/week   Barriers to discharge        Co-evaluation               AM-PAC PT "6 Clicks" Mobility  Outcome Measure Help needed turning from your back to your side while in a flat bed without using bedrails?: A Little Help needed moving from lying on your back to sitting on the side of a flat bed without using bedrails?: A Lot Help needed moving to and from a bed to a chair (including a wheelchair)?: A Lot Help needed standing up from a chair using your arms (e.g., wheelchair or bedside chair)?: A Lot Help needed to walk in hospital room?: A Lot Help needed climbing 3-5 steps with a  railing? : A Lot 6 Click Score: 13    End of Session Equipment Utilized During Treatment: Gait belt Activity Tolerance: Patient tolerated treatment well Patient left: in chair;with call bell/phone within reach;with chair alarm set;with family/visitor present;with nursing/sitter in room Nurse Communication: Mobility status PT Visit Diagnosis: Muscle weakness (generalized) (M62.81);Difficulty in walking, not elsewhere classified (R26.2)    Time: 1530-1600 PT Time Calculation (min) (ACUTE ONLY): 30 min   Charges:   PT Evaluation $PT Eval Low Complexity: 1 Low PT Treatments $Therapeutic Activity: 8-22 mins        Verner Mould, DPT Acute Rehabilitation Services Office [phone] Pager [phone]   Jacques Navy 12/12/2020, 5:00 PM

## 2020-12-12 NOTE — Anesthesia Procedure Notes (Signed)
Spinal  Patient location during procedure: OR End time: 12/12/2020 9:28 AM Reason for block: surgical anesthesia Staffing Performed: resident/CRNA  Resident/CRNA: Mckell Riecke D, CRNA Preanesthetic Checklist Completed: patient identified, IV checked, site marked, risks and benefits discussed, surgical consent, monitors and equipment checked, pre-op evaluation and timeout performed Spinal Block Patient position: sitting Prep: ChloraPrep Patient monitoring: heart rate, continuous pulse ox and blood pressure Approach: midline Location: L4-5 Injection technique: single-shot Needle Needle type: Spinocan  Needle gauge: 24 G Needle length: 9 cm Assessment Sensory level: T6 Events: CSF return

## 2020-12-13 ENCOUNTER — Encounter (HOSPITAL_COMMUNITY): Payer: Self-pay | Admitting: Orthopedic Surgery

## 2020-12-13 DIAGNOSIS — I10 Essential (primary) hypertension: Secondary | ICD-10-CM | POA: Diagnosis not present

## 2020-12-13 DIAGNOSIS — Z87891 Personal history of nicotine dependence: Secondary | ICD-10-CM | POA: Diagnosis not present

## 2020-12-13 DIAGNOSIS — Z20822 Contact with and (suspected) exposure to covid-19: Secondary | ICD-10-CM | POA: Diagnosis not present

## 2020-12-13 DIAGNOSIS — Z96641 Presence of right artificial hip joint: Secondary | ICD-10-CM | POA: Diagnosis not present

## 2020-12-13 DIAGNOSIS — M1712 Unilateral primary osteoarthritis, left knee: Secondary | ICD-10-CM | POA: Diagnosis not present

## 2020-12-13 LAB — BASIC METABOLIC PANEL
Anion gap: 6 (ref 5–15)
BUN: 19 mg/dL (ref 8–23)
CO2: 21 mmol/L — ABNORMAL LOW (ref 22–32)
Calcium: 8 mg/dL — ABNORMAL LOW (ref 8.9–10.3)
Chloride: 107 mmol/L (ref 98–111)
Creatinine, Ser: 0.77 mg/dL (ref 0.44–1.00)
GFR, Estimated: >60 mL/min (ref >60)
Glucose, Bld: 105 mg/dL — ABNORMAL HIGH (ref 70–99)
Potassium: 4.5 mmol/L (ref 3.5–5.1)
Sodium: 134 mmol/L — ABNORMAL LOW (ref 135–145)

## 2020-12-13 LAB — CBC
HCT: 27.8 % — ABNORMAL LOW (ref 36.0–46.0)
Hemoglobin: 9.1 g/dL — ABNORMAL LOW (ref 12.0–15.0)
MCH: 31.7 pg (ref 26.0–34.0)
MCHC: 32.7 g/dL (ref 30.0–36.0)
MCV: 96.9 fL (ref 80.0–100.0)
Platelets: 262 10*3/uL (ref 150–400)
RBC: 2.87 MIL/uL — ABNORMAL LOW (ref 3.87–5.11)
RDW: 13.9 % (ref 11.5–15.5)
WBC: 8 10*3/uL (ref 4.0–10.5)
nRBC: 0 % (ref 0.0–0.2)

## 2020-12-13 LAB — RESP PANEL BY RT-PCR (FLU A&B, COVID) ARPGX2
Influenza A by PCR: NEGATIVE
Influenza B by PCR: NEGATIVE
SARS Coronavirus 2 by RT PCR: NEGATIVE

## 2020-12-13 MED ORDER — TRAMADOL HCL 50 MG PO TABS: 50.0000 mg | ORAL_TABLET | Freq: Four times a day (QID) | ORAL | 0 refills | Status: DC | PRN

## 2020-12-13 MED ORDER — HYDROCODONE-ACETAMINOPHEN 5-325 MG PO TABS: 1.0000 | ORAL_TABLET | Freq: Four times a day (QID) | ORAL | 0 refills | Status: DC | PRN

## 2020-12-13 MED ORDER — METHOCARBAMOL 500 MG PO TABS: 500.0000 mg | ORAL_TABLET | Freq: Four times a day (QID) | ORAL | 0 refills | Status: DC | PRN

## 2020-12-13 MED ORDER — ASPIRIN 325 MG PO TBEC: 325.0000 mg | DELAYED_RELEASE_TABLET | Freq: Two times a day (BID) | ORAL | 0 refills | Status: DC

## 2020-12-13 NOTE — Progress Notes (Signed)
Physical Therapy Treatment Patient Details Name: Amy Carroll MRN: [id number] DOB: [birth year] Today's Date: 12/13/2020   History of Present Illness Patient is 86 y.o. female s/p LT TKA on 12/12/20 with PMH significant for OA, HTN, anemia, Rt TKA and revision, Rt THA.    PT Comments    POD # 1 am session Spouse present during session.  General Comments: AxO x 2 hx Dementia and diod require repeat functions VC's 50% of time.  General bed mobility comments: demonstarted and instructed how to use belt to self assist LE.  General transfer comment: 75% VC's on proper hand placement as well as LE extension.  Increased assist to complete turns.  Unsteady.  Posterior LOB. General Gait Details: Applied personal shoes as well as wearing KI for increased support and 75% VC's on proper sequencing as well as proper walker to self distance.  Unsteady.  Short steps.  Recliner following for safety. Then returned to room to perform some TE's following HEP handout.  Instructed on proper tech, freq as well as use of ICE.   Pt plans to D/C to [facility] level for Rehab.   Recommendations for follow up therapy are one component of a multi-disciplinary discharge planning process, led by the attending physician.  Recommendations may be updated based on patient status, additional functional criteria and insurance authorization.  Follow Up Recommendations  SNF     Equipment Recommendations  None recommended by PT    Recommendations for Other Services       Precautions / Restrictions Precautions Precautions: Fall Precaution Comments: instructed no pillow under knee Restrictions Weight Bearing Restrictions: No LLE Weight Bearing: Weight bearing as tolerated     Mobility  Bed Mobility Overal bed mobility: Needs Assistance Bed Mobility: Supine to Sit     Supine to sit: Min assist     General bed mobility comments: demonstarted and instructed how to use belt to self assist LE     Transfers Overall transfer level: Needs assistance Equipment used: Rolling walker (2 wheeled) Transfers: Sit to/from [facility] Sit to Stand: Mod assist Stand pivot transfers: Mod assist;Max assist       General transfer comment: 75% VC's on proper hand placement as well as LE extension.  Increased assist to complete turns.  Unsteady.  Posterior LOB.  Ambulation/Gait Ambulation/Gait assistance: Min assist;Mod assist Gait Distance (Feet): 26 Feet Assistive device: Rolling walker (2 wheeled) Gait Pattern/deviations: Step-to pattern;Decreased stance time - left Gait velocity: decreased   General Gait Details: Applied personal shoes as well as wearing KI for increased support and 75% VC's on proper sequencing as well as proper walker to self distance.  Unsteady.  Short steps.  Recliner following for safety.   Stairs             Wheelchair Mobility    Modified Rankin (Stroke Patients Only)       Balance                                            Cognition Arousal/Alertness: Awake/alert Behavior During Therapy: WFL for tasks assessed/performed Overall Cognitive Status: Within Functional Limits for tasks assessed                                 General Comments: AxO x 2 hx Dementia and diod require  repeat functions VC's 50% of time      Exercises  Total Knee Replacement TE's following HEP handout 10 reps B LE ankle pumps 05 reps towel squeezes 05 reps knee presses 05 reps heel slides  05 reps SAQ's 05 reps SLR's 05 reps ABD Educated on use of gait belt to assist with TE's Followed by ICE     General Comments        Pertinent Vitals/Pain Pain Assessment: 0-10 Pain Score: 4  Pain Location: Lt kne Pain Descriptors / Indicators: Aching;Discomfort;Operative site guarding Pain Intervention(s): Monitored during session;Premedicated before session;Repositioned;Ice applied    Home Living                       Prior Function            PT Goals (current goals can now be found in the care plan section) Progress towards PT goals: Progressing toward goals    Frequency    7X/week      PT Plan Current plan remains appropriate    Co-evaluation              AM-PAC PT "6 Clicks" Mobility   Outcome Measure  Help needed turning from your back to your side while in a flat bed without using bedrails?: A Little Help needed moving from lying on your back to sitting on the side of a flat bed without using bedrails?: A Lot Help needed moving to and from a bed to a chair (including a wheelchair)?: A Lot Help needed standing up from a chair using your arms (e.g., wheelchair or bedside chair)?: A Lot Help needed to walk in hospital room?: A Lot Help needed climbing 3-5 steps with a railing? : Total 6 Click Score: 12    End of Session Equipment Utilized During Treatment: Gait belt Activity Tolerance: Patient tolerated treatment well Patient left: in chair;with call bell/phone within reach;with chair alarm set;with family/visitor present;with nursing/sitter in room Nurse Communication: Mobility status PT Visit Diagnosis: Muscle weakness (generalized) (M62.81);Difficulty in walking, not elsewhere classified (R26.2)     Time: 6333-5456 PT Time Calculation (min) (ACUTE ONLY): 24 min  Charges:  $Gait Training: 8-22 mins $Therapeutic Exercise: 8-22 mins                     Rica Koyanagi  PTA Acute  Rehabilitation Services Pager      [phone] Office      [phone]

## 2020-12-13 NOTE — Plan of Care (Signed)
  Problem: Activity: Goal: Ability to avoid complications of mobility impairment will improve Outcome: Progressing   Problem: Clinical Measurements: Goal: Postoperative complications will be avoided or minimized Outcome: Progressing   Problem: Pain Management: Goal: Pain level will decrease with appropriate interventions Outcome: Progressing   

## 2020-12-13 NOTE — TOC Initial Note (Signed)
Transition of Care [facility]) - Initial/Assessment Note    Patient Details  Name: Amy Carroll MRN: [id number] Date of Birth: 1935-02-18  [facility] of [facility] CM/SW Contact:    Lennart Pall, LCSW Phone Number: 12/13/2020, 10:05 AM  Clinical Narrative:                 Met with pt and spouse this morning to review dc plans. Pt is a resident in [place] [facility] at [facility] and notes plan is to dc to rehab at [facility].  Have confirmed plan with admissions coordinator, Katy Fitch at [facility].  Planning to admit to rehab tomorrow. Have alerted PA of need for another COVID test per facility request.  Expected Discharge Plan: Skilled Nursing Facility Barriers to Discharge: No Barriers Identified   Patient Goals and CMS Choice Patient states their goals for this hospitalization and ongoing recovery are:: [place]      Expected Discharge Plan and Services Expected Discharge Plan: [place] In-house Referral: Clinical Social Work     Living arrangements for the past 2 months: [place] Expected Discharge Date: 12/13/20               DME Arranged: N/A                    Prior Living Arrangements/Services Living arrangements for the past 2 months: [place] Lives with:: Spouse Patient language and need for interpreter reviewed:: Yes Do you feel safe going back to the place where you live?: Yes      Need for Family Participation in Patient Care: No (Comment) Care giver support system in place?: Yes (comment)   Criminal Activity/Legal Involvement Pertinent to Current Situation/Hospitalization: No - Comment as needed  Activities of Daily Living Home Assistive Devices/Equipment: Environmental consultant (specify type), Eyeglasses ADL Screening (condition at time of admission) Patient's cognitive ability adequate to safely complete daily activities?: Yes Is the patient deaf or have difficulty  hearing?: Yes Does the patient have difficulty seeing, even when wearing glasses/contacts?: No Does the patient have difficulty concentrating, remembering, or making decisions?: No Patient able to express need for assistance with ADLs?: Yes Does the patient have difficulty dressing or bathing?: Yes Independently performs ADLs?: Yes (appropriate for developmental age) Does the patient have difficulty walking or climbing stairs?: Yes Weakness of Legs: Both Weakness of Arms/Hands: None  Permission Sought/Granted Permission sought to share information with : Facility Art therapist granted to share information with : Yes, Verbal Permission Granted     Permission granted to share info w AGENCY: admissions coord at [facility]        Emotional Assessment Appearance:: Appears stated age Attitude/Demeanor/Rapport: Gracious Affect (typically observed): Accepting Orientation: : Oriented to Self, Oriented to Place, Oriented to  Time, Oriented to Situation Alcohol / Substance Use: Not Applicable Psych Involvement: No (comment)  Admission diagnosis:  Primary osteoarthritis of left knee [M17.12] Patient Active Problem List   Diagnosis Date Noted   Primary osteoarthritis of left knee 12/12/2020   Iron deficiency anemia 12/02/2020   Gait abnormality 06/20/2020   OA (osteoarthritis) of hip 02/18/2019   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   Allergic contact dermatitis due to metals 03/17/2018   Presence of right artificial knee joint 04/02/2016   Effusion, right knee 04/02/2016   Osteoarthritis of right knee 05/27/2012    Class: Diagnosis of  PCP:  Merrilee Seashore, MD Pharmacy:   [facility]  ([place], [facility] [address] [place] [address] [place] [address] Phone: [phone] Fax: [phone]  [id number]: [phone]: [phone] [id number] [place]one: [phone]: [phone]   Social Determinants of Health (SDOH) Interventions    Readmission Risk Interventions No flowsheet data found.

## 2020-12-13 NOTE — Progress Notes (Signed)
Physical Therapy Treatment Patient Details Name: Amy Carroll MRN: [id number] DOB: Jan 03, 1935 Today's Date: 12/13/2020   History of Present Illness Patient is 86 y.o. female s/p LT TKA on 12/12/20 with PMH significant for OA, HTN, anemia, Rt TKA and revision, Rt THA.    PT Comments    POD # 1 pm session Assisted from standing up from [facility] with RN.  Applied KI.  General Gait Details: still wearing personal shoes and applied KI.  Decreased amb distance this afternoon due to increased c/o fatigue.  Unsteady.  50% VC's on proper walker to self distance and safety with turns.General transfer comment: 75% VC's on proper hand placement as well as LE extension.  Increased assist to complete turns.  Unsteady.  Posterior LOB.General bed mobility comments: demonstarted and instructed how to use belt to self assist LE.  Performed a few TE's followed by ICE.   Pt plans to D[facility].   Recommendations for follow up therapy are one component of a multi-disciplinary discharge planning process, led by the attending physician.  Recommendations may be updated based on patient status, additional functional criteria and insurance authorization.  Follow Up Recommendations  SNF     Equipment Recommendations  None recommended by PT    Recommendations for Other Services       Precautions / Restrictions Precautions Precautions: Fall Precaution Comments: instructed no pillow under knee Restrictions Weight Bearing Restrictions: No LLE Weight Bearing: Weight bearing as tolerated     Mobility  Bed Mobility Overal bed mobility: Needs Assistance Bed Mobility: Sit to Supine     Supine to sit: Min guard;Min assist     General bed mobility comments: demonstarted and instructed how to use belt to self assist LE    Transfers Overall transfer level: Needs assistance Equipment used: Rolling walker (2 wheeled) Transfers: Sit to/from [facility] Sit to Stand:  Min assist Stand pivot transfers: Min assist;Mod assist       General transfer comment: 75% VC's on proper hand placement as well as LE extension.  Increased assist to complete turns.  Unsteady.  Posterior LOB.  Ambulation/Gait Ambulation/Gait assistance: Min assist;Min guard Gait Distance (Feet): 14 Feet Assistive device: Rolling walker (2 wheeled) Gait Pattern/deviations: Step-to pattern;Decreased stance time - left Gait velocity: decreased   General Gait Details: still wearing personal shoes and applied KI.  Decreased amb distance this afternoon due to increased c/o fatigue.  Unsteady.  50% VC's on proper walker to self distance and safety with turns.   Stairs             Wheelchair Mobility    Modified Rankin (Stroke Patients Only)       Balance                                            Cognition Arousal/Alertness: Awake/alert Behavior During Therapy: WFL for tasks assessed/performed Overall Cognitive Status: Within Functional Limits for tasks assessed                                 General Comments: AxO x 2 hx Dementia and diod require repeat functions VC's 50% of time      Exercises  Total Knee Replacement TE's following HEP handout 10 reps B LE ankle pumps 05 reps towel squeezes 05  reps knee presses 05 reps heel slides  05 reps SAQ's 05 reps SLR's 05 reps ABD Educated on use of gait belt to assist with TE's Followed by ICE     General Comments        Pertinent Vitals/Pain Pain Assessment: 0-10 Pain Score: 4  Pain Location: Lt kne Pain Descriptors / Indicators: Aching;Discomfort;Operative site guarding Pain Intervention(s): Monitored during session;Premedicated before session;Repositioned;Ice applied    Home Living                      Prior Function            PT Goals (current goals can now be found in the care plan section) Progress towards PT goals: Progressing toward goals     Frequency    7X/week      PT Plan Current plan remains appropriate    Co-evaluation              AM-PAC PT "6 Clicks" Mobility   Outcome Measure  Help needed turning from your back to your side while in a flat bed without using bedrails?: A Little Help needed moving from lying on your back to sitting on the side of a flat bed without using bedrails?: A Lot Help needed moving to and from a bed to a chair (including a wheelchair)?: A Lot Help needed standing up from a chair using your arms (e.g., wheelchair or bedside chair)?: A Lot Help needed to walk in hospital room?: A Lot Help needed climbing 3-5 steps with a railing? : Total 6 Click Score: 12    End of Session Equipment Utilized During Treatment: Gait belt Activity Tolerance: Patient tolerated treatment well Patient left: in bed Nurse Communication: Mobility status PT Visit Diagnosis: Muscle weakness (generalized) (M62.81);Difficulty in walking, not elsewhere classified (R26.2)     Time: 1425-1450 PT Time Calculation (min) (ACUTE ONLY): 25 min  Charges:  $Gait Training: 8-22 mins $Therapeutic Exercise: 8-22 mins                     Rica Koyanagi  PTA Acute  Rehabilitation Services Pager      [phone] Office      [phone]

## 2020-12-13 NOTE — NC FL2 (Signed)
[place] LEVEL OF CARE SCREENING TOOL     IDENTIFICATION  Patient Name: Amy Carroll Birthdate: May 03, 1934 Sex: female Admission Date (Current Location): 12/12/2020  [facility] and [place] Number:  Herbalist and Address:  [facility],  [place] [address] [place]      Provider Number: [id number]  Attending Physician Name and Address:  Gaynelle Arabian, MD  Relative Name and Phone Number:       Current Level of Care: Hospital Recommended Level of Care: [place] Prior Approval Number:    Date Approved/Denied:   PASRR Number: [id number]  Discharge Plan: SNF    Current Diagnoses: Patient Active Problem List   Diagnosis Date Noted   Primary osteoarthritis of left knee 12/12/2020   Iron deficiency anemia 12/02/2020   Gait abnormality 06/20/2020   OA (osteoarthritis) of hip 02/18/2019   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   Allergic contact dermatitis due to metals 03/17/2018   Presence of right artificial knee joint 04/02/2016   Effusion, right knee 04/02/2016   Osteoarthritis of right knee 05/27/2012    Orientation RESPIRATION BLADDER Height & Weight     Self, Time, Situation, Place  Normal Continent Weight: 122 lb (55.3 kg) Height:  5\' 3"  (160 cm)  BEHAVIORAL SYMPTOMS/MOOD NEUROLOGICAL BOWEL NUTRITION STATUS      Continent    AMBULATORY STATUS COMMUNICATION OF NEEDS Skin   Limited Assist Verbally Other (Comment) (surgical incision only)                       Personal Care Assistance Level of Assistance  Bathing, Dressing Bathing Assistance: Limited assistance   Dressing Assistance: Limited assistance     Functional Limitations Info             [place]  PT (By licensed PT), OT (By licensed OT)     PT Frequency: 5x/wk OT Frequency: 5x/wk            Contractures Contractures Info: Not  present    Additional Factors Info  Code Status, Allergies Code Status Info: Full Allergies Info: NKDA           Current Medications (12/13/2020):  This is the current hospital active medication list Current Facility-Administered Medications  Medication Dose Route Frequency Provider Last Rate Last Admin   0.9 %  sodium chloride infusion   Intravenous Continuous Edmisten, Ok Anis, PA 75 mL/hr at 12/12/20 1858 Infusion Verify at 12/12/20 1858   acetaminophen (TYLENOL) tablet 325-650 mg  325-650 mg Oral Q6H PRN Edmisten, Kristie L, PA       amLODipine (NORVASC) tablet 5 mg  5 mg Oral Daily Edmisten, Kristie L, PA   5 mg at 12/13/20 0955   aspirin EC tablet 325 mg  325 mg Oral BID Edmisten, Kristie L, PA   325 mg at 12/13/20 0955   bisacodyl (DULCOLAX) suppository 10 mg  10 mg Rectal Daily PRN Edmisten, Kristie L, PA       brimonidine (ALPHAGAN) 0.2 % ophthalmic solution 1 drop  1 drop Right Eye BID Aluisio, Pilar Plate, MD   1 drop at 12/13/20 1001   And   timolol (TIMOPTIC) 0.5 % ophthalmic solution 1 drop  1 drop Right Eye BID Gaynelle Arabian, MD   1 drop at 12/13/20 1001   Chlorhexidine Gluconate Cloth 2 % PADS 6 each  6 each Topical Daily Aluisio, Pilar Plate,  MD   6 each at 12/13/20 1001   diphenhydrAMINE (BENADRYL) 12.5 MG/5ML elixir 12.5-25 mg  12.5-25 mg Oral Q4H PRN Edmisten, Kristie L, PA       docusate sodium (COLACE) capsule 100 mg  100 mg Oral BID Edmisten, Kristie L, PA   100 mg at 12/13/20 0955   dorzolamide (TRUSOPT) 2 % ophthalmic solution 1 drop  1 drop Right Eye BID Edmisten, Kristie L, PA   1 drop at 12/13/20 1001   ferrous sulfate tablet 325 mg  325 mg Oral Q breakfast Edmisten, Kristie L, PA   325 mg at 12/13/20 0955   irbesartan (AVAPRO) tablet 300 mg  300 mg Oral Daily Gaynelle Arabian, MD   300 mg at 12/13/20 5093   And   hydrochlorothiazide (HYDRODIURIL) tablet 25 mg  25 mg Oral Daily Gaynelle Arabian, MD   25 mg at 12/13/20 0955   HYDROcodone-acetaminophen (NORCO) 7.5-325 MG  per tablet 1-2 tablet  1-2 tablet Oral Q4H PRN Edmisten, Kristie L, PA       HYDROcodone-acetaminophen (NORCO/VICODIN) 5-325 MG per tablet 1-2 tablet  1-2 tablet Oral Q4H PRN Edmisten, Kristie L, PA   1 tablet at 12/12/20 2128   latanoprost (XALATAN) 0.005 % ophthalmic solution 1 drop  1 drop Both Eyes QHS Edmisten, Kristie L, PA   1 drop at 12/12/20 2141   menthol-cetylpyridinium (CEPACOL) lozenge 3 mg  1 lozenge Oral PRN Edmisten, Kristie L, PA       Or   phenol (CHLORASEPTIC) mouth spray 1 spray  1 spray Mouth/Throat PRN Edmisten, Kristie L, PA       methocarbamol (ROBAXIN) tablet 500 mg  500 mg Oral Q6H PRN Edmisten, Kristie L, PA   500 mg at 12/12/20 2128   Or   methocarbamol (ROBAXIN) 500 mg in dextrose 5 % 50 mL IVPB  500 mg Intravenous Q6H PRN Edmisten, Kristie L, PA   Stopped at 12/12/20 1142   metoCLOPramide (REGLAN) tablet 5-10 mg  5-10 mg Oral Q8H PRN Edmisten, Kristie L, PA       Or   metoCLOPramide (REGLAN) injection 5-10 mg  5-10 mg Intravenous Q8H PRN Edmisten, Kristie L, PA       morphine 2 MG/ML injection 0.5-1 mg  0.5-1 mg Intravenous Q2H PRN Edmisten, Kristie L, PA       ondansetron (ZOFRAN) tablet 4 mg  4 mg Oral Q6H PRN Edmisten, Kristie L, PA       Or   ondansetron (ZOFRAN) injection 4 mg  4 mg Intravenous Q6H PRN Edmisten, Kristie L, PA       polyethylene glycol (MIRALAX / GLYCOLAX) packet 17 g  17 g Oral Daily PRN Edmisten, Kristie L, PA       potassium chloride SA (KLOR-CON) CR tablet 40 mEq  40 mEq Oral Daily Edmisten, Kristie L, PA   40 mEq at 12/13/20 0955   sodium phosphate (FLEET) 7-19 GM/118ML enema 1 enema  1 enema Rectal Once PRN Edmisten, Kristie L, PA       traMADol (ULTRAM) tablet 50-100 mg  50-100 mg Oral Q6H PRN Edmisten, Kristie L, PA   50 mg at 12/13/20 2671     Discharge Medications: Please see discharge summary for a list of discharge medications.  Relevant Imaging Results:  Relevant Lab Results:   Additional Information SS#  [id number]  Lennart Pall, LCSW

## 2020-12-13 NOTE — Progress Notes (Signed)
   Subjective: 1 Day Post-Op Procedure(s) (LRB): TOTAL KNEE ARTHROPLASTY (Left) Patient reports pain as mild.   Patient seen in rounds by Dr. Wynelle Link. Patient is well, and has had no acute complaints or problems other than pain in the left knee. No issues overnight. Denies chest pain or SOB. Foley catheter removed this AM. We will continue therapy today.   Objective: Vital signs in last 24 hours: Temp:  [97.4 F (36.3 C)-98.6 F (37 C)] 98.1 F (36.7 C) (10/18 0427) Pulse Rate:  [75-92] 78 (10/18 0427) Resp:  [13-29] 16 (10/18 0427) BP: (104-132)/(56-72) 114/58 (10/18 0427) SpO2:  [96 %-100 %] 96 % (10/18 0427)  Intake/Output from previous day:  Intake/Output Summary (Last 24 hours) at 12/13/2020 0740 Last data filed at 12/13/2020 0714 Gross per 24 hour  Intake 4233.32 ml  Output 4175 ml  Net 58.32 ml     Intake/Output this shift: Total I/O In: -  Out: 300 [Urine:300]  Labs: Recent Labs    12/13/20 0317  HGB 9.1*   Recent Labs    12/13/20 0317  WBC 8.0  RBC 2.87*  HCT 27.8*  PLT 262   Recent Labs    12/13/20 0317  NA 134*  K 4.5  CL 107  CO2 21*  BUN 19  CREATININE 0.77  GLUCOSE 105*  CALCIUM 8.0*   No results for input(s): LABPT, INR in the last 72 hours.  Exam: General - Patient is Alert and Oriented Extremity - Neurologically intact Neurovascular intact Sensation intact distally Dorsiflexion/Plantar flexion intact Dressing - dressing C/D/I Motor Function - intact, moving foot and toes well on exam.   Past Medical History:  Diagnosis Date   Anemia    Arthritis    Gait abnormality 06/20/2020   Hypertension     Assessment/Plan: 1 Day Post-Op Procedure(s) (LRB): TOTAL KNEE ARTHROPLASTY (Left) Active Problems:   Primary osteoarthritis of left knee  Estimated body mass index is 21.61 kg/m as calculated from the following:   Height as of this encounter: 5\' 3"  (1.6 m).   Weight as of this encounter: 55.3 kg. Advance diet Up with  therapy  Anticipated LOS equal to or greater than 2 midnights due to - Age 40 and older with one or more of the following:  - Obesity  - Expected need for hospital services (PT, OT, Nursing) required for safe  discharge  - Anticipated need for postoperative skilled nursing care or inpatient rehab  - Active co-morbidities: None OR   - Unanticipated findings during/Post Surgery: None  - Patient is a high risk of re-admission due to: None   DVT Prophylaxis - Aspirin Weight bearing as tolerated. Continue therapy.  Plan is to go to Skilled nursing facility after hospital stay. Plan for discharge tomorrow once bed arranged at [facility].   Theresa Duty, PA-C Orthopedic Surgery [phone] 12/13/2020, 7:40 AM

## 2020-12-14 DIAGNOSIS — Z20822 Contact with and (suspected) exposure to covid-19: Secondary | ICD-10-CM | POA: Diagnosis not present

## 2020-12-14 DIAGNOSIS — M1712 Unilateral primary osteoarthritis, left knee: Secondary | ICD-10-CM | POA: Diagnosis not present

## 2020-12-14 DIAGNOSIS — I10 Essential (primary) hypertension: Secondary | ICD-10-CM | POA: Diagnosis not present

## 2020-12-14 DIAGNOSIS — Z87891 Personal history of nicotine dependence: Secondary | ICD-10-CM | POA: Diagnosis not present

## 2020-12-14 DIAGNOSIS — Z96641 Presence of right artificial hip joint: Secondary | ICD-10-CM | POA: Diagnosis not present

## 2020-12-14 LAB — CBC
HCT: 29.1 % — ABNORMAL LOW (ref 36.0–46.0)
Hemoglobin: 9.4 g/dL — ABNORMAL LOW (ref 12.0–15.0)
MCH: 31.1 pg (ref 26.0–34.0)
MCHC: 32.3 g/dL (ref 30.0–36.0)
MCV: 96.4 fL (ref 80.0–100.0)
Platelets: 264 10*3/uL (ref 150–400)
RBC: 3.02 MIL/uL — ABNORMAL LOW (ref 3.87–5.11)
RDW: 14.3 % (ref 11.5–15.5)
WBC: 7.6 10*3/uL (ref 4.0–10.5)
nRBC: 0 % (ref 0.0–0.2)

## 2020-12-14 LAB — BASIC METABOLIC PANEL
Anion gap: 5 (ref 5–15)
BUN: 15 mg/dL (ref 8–23)
CO2: 23 mmol/L (ref 22–32)
Calcium: 8.2 mg/dL — ABNORMAL LOW (ref 8.9–10.3)
Chloride: 107 mmol/L (ref 98–111)
Creatinine, Ser: 0.8 mg/dL (ref 0.44–1.00)
GFR, Estimated: >60 mL/min (ref >60)
Glucose, Bld: 89 mg/dL (ref 70–99)
Potassium: 4 mmol/L (ref 3.5–5.1)
Sodium: 135 mmol/L (ref 135–145)

## 2020-12-14 MED ORDER — METHOCARBAMOL 500 MG PO TABS: 500.0000 mg | ORAL_TABLET | Freq: Four times a day (QID) | ORAL | 0 refills | Status: DC | PRN

## 2020-12-14 MED ORDER — ASPIRIN 325 MG PO TBEC: 325.0000 mg | DELAYED_RELEASE_TABLET | Freq: Two times a day (BID) | ORAL | 0 refills | Status: DC

## 2020-12-14 MED ORDER — ASPIRIN 325 MG PO TBEC: 325.0000 mg | DELAYED_RELEASE_TABLET | Freq: Two times a day (BID) | ORAL | 0 refills | Status: AC

## 2020-12-14 MED ORDER — TRAMADOL HCL 50 MG PO TABS: 50.0000 mg | ORAL_TABLET | Freq: Four times a day (QID) | ORAL | 0 refills | Status: DC | PRN

## 2020-12-14 NOTE — Discharge Summary (Signed)
Physician Discharge Summary   Patient ID: Amy Carroll MRN: [id number] DOB/AGE: [birth year] 86 y.o.  Admit date: 12/12/2020 Discharge date: 12/14/2020  Primary Diagnosis: Osteoarthritis, left knee   Admission Diagnoses:  Past Medical History:  Diagnosis Date   Anemia    Arthritis    Gait abnormality 06/20/2020   Hypertension    Discharge Diagnoses:   Active Problems:   Primary osteoarthritis of left knee  Estimated body mass index is 21.61 kg/m as calculated from the following:   Height as of this encounter: 5\' 3"  (1.6 m).   Weight as of this encounter: 55.3 kg.  Procedure:  Procedure(s) (LRB): TOTAL KNEE ARTHROPLASTY (Left)   Consults: None  HPI: Amy Carroll is a 86 y.o. year old female with end stage OA of her left knee with progressively worsening pain and dysfunction. She has constant pain, with activity and at rest and significant functional deficits with difficulties even with ADLs. She has had extensive non-op management including analgesics, injections of cortisone and viscosupplements, and home exercise program, but remains in significant pain with significant dysfunction. Radiographs show bone on bone arthritis medial and patellofemoral. She presents now for left Total Knee Arthroplasty.  Laboratory Data: Admission on 12/12/2020  Component Date Value Ref Range Status   WBC 12/13/2020 8.0  4.0 - 10.5 K/uL Final   RBC 12/13/2020 2.87 (A) 3.87 - 5.11 MIL/uL Final   Hemoglobin 12/13/2020 9.1 (A) 12.0 - 15.0 g/dL Final   HCT 12/13/2020 27.8 (A) 36.0 - 46.0 % Final   MCV 12/13/2020 96.9  80.0 - 100.0 fL Final   MCH 12/13/2020 31.7  26.0 - 34.0 pg Final   MCHC 12/13/2020 32.7  30.0 - 36.0 g/dL Final   RDW 12/13/2020 13.9  11.5 - 15.5 % Final   Platelets 12/13/2020 262  150 - 400 K/uL Final   nRBC 12/13/2020 0.0  0.0 - 0.2 % Final   Performed at [facility], [place] [address], [place], [place] [address]   Sodium 12/13/2020 134 (A) 135 -  145 mmol/L Final   Potassium 12/13/2020 4.5  3.5 - 5.1 mmol/L Final   Chloride 12/13/2020 107  98 - 111 mmol/L Final   CO2 12/13/2020 21 (A) 22 - 32 mmol/L Final   Glucose, Bld 12/13/2020 105 (A) 70 - 99 mg/dL Final   Glucose reference range applies only to samples taken after fasting for at least 8 hours.   BUN 12/13/2020 19  8 - 23 mg/dL Final   Creatinine, Ser 12/13/2020 0.77  0.44 - 1.00 mg/dL Final   Calcium 12/13/2020 8.0 (A) 8.9 - 10.3 mg/dL Final   GFR, Estimated 12/13/2020 >60  >60 mL/min Final   Comment: (NOTE) Calculated using the CKD-EPI Creatinine Equation (2021)    Anion gap 12/13/2020 6  5 - 15 Final   Performed at [facility], [place] [address], [place], [place] [address]   SARS Coronavirus 2 by RT PCR 12/13/2020 NEGATIVE  NEGATIVE Final   Comment: (NOTE) SARS-CoV-2 target nucleic acids are NOT DETECTED.  The SARS-CoV-2 RNA is generally detectable in upper respiratory specimens during the acute phase of infection. The lowest concentration of SARS-CoV-2 viral copies this assay can detect is 138 copies/mL. A negative result does not preclude SARS-Cov-2 infection and should not be used as the sole basis for treatment or other patient management decisions. A negative result may occur with  improper specimen collection/handling, submission of specimen other than nasopharyngeal swab, presence of viral mutation(s) within the  areas targeted by this assay, and inadequate number of viral copies(<138 copies/mL). A negative result must be combined with clinical observations, patient history, and epidemiological information. The expected result is Negative.  Fact Sheet for Patients:  EntrepreneurPulse.com.au  Fact Sheet for Healthcare Providers:  IncredibleEmployment.be  This test is no                          t yet approved or cleared by the [place] FDA and  has been authorized for detection and/or diagnosis of  SARS-CoV-2 by FDA under an Emergency Use Authorization (EUA). This EUA will remain  in effect (meaning this test can be used) for the duration of the COVID-19 declaration under Section 564(b)(1) of the Act, 21 U.S.C.section 360bbb-3(b)(1), unless the authorization is terminated  or revoked sooner.       Influenza A by PCR 12/13/2020 NEGATIVE  NEGATIVE Final   Influenza B by PCR 12/13/2020 NEGATIVE  NEGATIVE Final   Comment: (NOTE) The Xpert Xpress SARS-CoV-2/FLU/RSV plus assay is intended as an aid in the diagnosis of influenza from Nasopharyngeal swab specimens and should not be used as a sole basis for treatment. Nasal washings and aspirates are unacceptable for Xpert Xpress SARS-CoV-2/FLU/RSV testing.  Fact Sheet for Patients: EntrepreneurPulse.com.au  Fact Sheet for Healthcare Providers: IncredibleEmployment.be  This test is not yet approved or cleared by the [place] FDA and has been authorized for detection and/or diagnosis of SARS-CoV-2 by FDA under an Emergency Use Authorization (EUA). This EUA will remain in effect (meaning this test can be used) for the duration of the COVID-19 declaration under Section 564(b)(1) of the Act, 21 U.S.C. section 360bbb-3(b)(1), unless the authorization is terminated or revoked.  Performed at [facility], [place] [address] [place], [place] [address]    WBC 12/14/2020 7.6  4.0 - 10.5 K/uL Final   RBC 12/14/2020 3.02 (A) 3.87 - 5.11 MIL/uL Final   Hemoglobin 12/14/2020 9.4 (A) 12.0 - 15.0 g/dL Final   HCT 12/14/2020 29.1 (A) 36.0 - 46.0 % Final   MCV 12/14/2020 96.4  80.0 - 100.0 fL Final   MCH 12/14/2020 31.1  26.0 - 34.0 pg Final   MCHC 12/14/2020 32.3  30.0 - 36.0 g/dL Final   RDW 12/14/2020 14.3  11.5 - 15.5 % Final   Platelets 12/14/2020 264  150 - 400 K/uL Final   nRBC 12/14/2020 0.0  0.0 - 0.2 % Final   Performed at [facility], [place] [address],  [place], [place] [address]   Sodium 12/14/2020 135  135 - 145 mmol/L Final   Potassium 12/14/2020 4.0  3.5 - 5.1 mmol/L Final   Chloride 12/14/2020 107  98 - 111 mmol/L Final   CO2 12/14/2020 23  22 - 32 mmol/L Final   Glucose, Bld 12/14/2020 89  70 - 99 mg/dL Final   Glucose reference range applies only to samples taken after fasting for at least 8 hours.   BUN 12/14/2020 15  8 - 23 mg/dL Final   Creatinine, Ser 12/14/2020 0.80  0.44 - 1.00 mg/dL Final   Calcium 12/14/2020 8.2 (A) 8.9 - 10.3 mg/dL Final   GFR, Estimated 12/14/2020 >60  >60 mL/min Final   Comment: (NOTE) Calculated using the CKD-EPI Creatinine Equation (2021)    Anion gap 12/14/2020 5  5 - 15 Final   Performed at [facility], [place] [address] [place], [place] [address]  Orders Only on 12/08/2020  Component  Date Value Ref Range Status   SARS Coronavirus 2 12/08/2020 RESULT: NEGATIVE   Final   Comment: RESULT: NEGATIVESARS-CoV-2 INTERPRETATION:A NEGATIVE  test result means that SARS-CoV-2 RNA was not present in the specimen above the limit of detection of this test. This does not preclude a possible SARS-CoV-2 infection and should not be used as the  sole basis for patient management decisions. Negative results must be combined with clinical observations, patient history, and epidemiological information. Optimum specimen types and timing for peak viral levels during infections caused by SARS-CoV-2  have not been determined. Collection of multiple specimens or types of specimens may be necessary to detect virus. Improper specimen collection and handling, sequence variability under primers/probes, or organism present below the limit of detection may  lead to false negative results. Positive and negative predictive values of testing are highly dependent on prevalence. False negative test results are more likely when prevalence of disease is high.The expected result is NEGATIVE.Fact S                          heet for   Healthcare Providers: LocalChronicle.no Sheet for Patients: SalonLookup.es Reference Range - Negative   Hospital Outpatient Visit on 12/02/2020  Component Date Value Ref Range Status   MRSA, PCR 12/02/2020 NEGATIVE  NEGATIVE Final   Staphylococcus aureus 12/02/2020 NEGATIVE  NEGATIVE Final   Comment: (NOTE) The Xpert SA Assay (FDA approved for NASAL specimens in patients 15 years of age and older), is one component of a comprehensive surveillance program. It is not intended to diagnose infection nor to guide or monitor treatment. Performed at [facility], [place] [address], [place], [place] [address]    WBC 12/02/2020 5.5  4.0 - 10.5 K/uL Final   RBC 12/02/2020 3.40 (A) 3.87 - 5.11 MIL/uL Final   Hemoglobin 12/02/2020 10.3 (A) 12.0 - 15.0 g/dL Final   HCT 12/02/2020 32.8 (A) 36.0 - 46.0 % Final   MCV 12/02/2020 96.5  80.0 - 100.0 fL Final   MCH 12/02/2020 30.3  26.0 - 34.0 pg Final   MCHC 12/02/2020 31.4  30.0 - 36.0 g/dL Final   RDW 12/02/2020 13.3  11.5 - 15.5 % Final   Platelets 12/02/2020 359  150 - 400 K/uL Final   nRBC 12/02/2020 0.0  0.0 - 0.2 % Final   Performed at [facility], [place] [address], [place], [place] [address]   Sodium 12/02/2020 144  135 - 145 mmol/L Final   Potassium 12/02/2020 4.2  3.5 - 5.1 mmol/L Final   Chloride 12/02/2020 106  98 - 111 mmol/L Final   CO2 12/02/2020 29  22 - 32 mmol/L Final   Glucose, Bld 12/02/2020 94  70 - 99 mg/dL Final   Glucose reference range applies only to samples taken after fasting for at least 8 hours.   BUN 12/02/2020 26 (A) 8 - 23 mg/dL Final   Creatinine, Ser 12/02/2020 1.01 (A) 0.44 - 1.00 mg/dL Final   Calcium 12/02/2020 8.9  8.9 - 10.3 mg/dL Final   Total Protein 12/02/2020 7.1  6.5 - 8.1 g/dL Final   Albumin 12/02/2020 3.4 (A) 3.5 - 5.0 g/dL Final   AST 12/02/2020 14 (A) 15 - 41 U/L Final   ALT 12/02/2020 12  0 - 44 U/L  Final   Alkaline Phosphatase 12/02/2020 66  38 - 126 U/L Final   Total Bilirubin 12/02/2020 0.3  0.3 - 1.2 mg/dL Final   GFR, Estimated  12/02/2020 55 (A) >60 mL/min Final   Comment: (NOTE) Calculated using the CKD-EPI Creatinine Equation (2021)    Anion gap 12/02/2020 9  5 - 15 Final   Performed at [facility], [place] [address] [place], [place] [address]   ABO/RH(D) 12/02/2020 A POS   Final   Antibody Screen 12/02/2020 NEG   Final   Sample Expiration 12/02/2020 12/15/2020,2359   Final   Extend sample reason 12/02/2020    Final                   Value:NO TRANSFUSIONS OR PREGNANCY IN THE PAST 3 MONTHS Performed at [place] [address] [place], [place] [address]    Prothrombin Time 12/02/2020 12.1  11.4 - 15.2 seconds Final   INR 12/02/2020 0.9  0.8 - 1.2 Final   Comment: (NOTE) INR goal varies based on device and disease states. Performed at [facility], [place] [address], [place],  [address]   Appointment on 11/23/2020  Component Date Value Ref Range Status   Blood Bank Specimen 11/23/2020 SAMPLE AVAILABLE FOR TESTING   Final   Sample Expiration 11/23/2020    Final                   Value:11/26/2020,2359 Performed at [facility], [place] [address] [place], [place] [address]    Iron 11/23/2020 30 (A) 41 - 142 ug/dL Final   TIBC 11/23/2020 233 (A) 236 - 444 ug/dL Final   Saturation Ratios 11/23/2020 13 (A) 21 - 57 % Final   UIBC 11/23/2020 203  120 - 384 ug/dL Final   Performed at [facility], [place] [address], [place], [place] [address]   Ferritin 11/23/2020 173  11 - 307 ng/mL Final   Performed at [facility], [place] [address] [place], [place] [address]   Sodium 11/23/2020 138  135 - 145 mmol/L Final   Potassium 11/23/2020 2.9 (A) 3.5 - 5.1 mmol/L Final   Chloride 11/23/2020 100  98 - 111 mmol/L Final   CO2 11/23/2020 26  22 - 32 mmol/L Final    Glucose, Bld 11/23/2020 95  70 - 99 mg/dL Final   Glucose reference range applies only to samples taken after fasting for at least 8 hours.   BUN 11/23/2020 28 (A) 8 - 23 mg/dL Final   Creatinine 11/23/2020 0.95  0.44 - 1.00 mg/dL Final   Calcium 11/23/2020 9.0  8.9 - 10.3 mg/dL Final   Total Protein 11/23/2020 7.2  6.5 - 8.1 g/dL Final   Albumin 11/23/2020 3.2 (A) 3.5 - 5.0 g/dL Final   AST 11/23/2020 11 (A) 15 - 41 U/L Final   ALT 11/23/2020 10  0 - 44 U/L Final   Alkaline Phosphatase 11/23/2020 81  38 - 126 U/L Final   Total Bilirubin 11/23/2020 0.4  0.3 - 1.2 mg/dL Final   GFR, Estimated 11/23/2020 59 (A) >60 mL/min Final   Comment: (NOTE) Calculated using the CKD-EPI Creatinine Equation (2021)    Anion gap 11/23/2020 12  5 - 15 Final   Performed at [facility], [place] [address], [place], [place] [address]   WBC 11/23/2020 6.3  4.0 - 10.5 K/uL Final   RBC 11/23/2020 3.20 (A) 3.87 - 5.11 MIL/uL Final   Hemoglobin 11/23/2020 9.8 (A) 12.0 - 15.0 g/dL Final   HCT 11/23/2020 29.6 (A) 36.0 - 46.0 % Final   MCV 11/23/2020 92.5  80.0 - 100.0 fL Final   MCH 11/23/2020 30.6  26.0 - 34.0 pg Final   MCHC 11/23/2020 33.1  30.0 - 36.0 g/dL Final   RDW 11/23/2020 13.1  11.5 - 15.5 % Final   Platelets 11/23/2020 347  150 - 400 K/uL Final   nRBC 11/23/2020 0.0  0.0 - 0.2 % Final   Neutrophils Relative % 11/23/2020 61  % Final   Neutro Abs 11/23/2020 3.9  1.7 - 7.7 K/uL Final   Lymphocytes Relative 11/23/2020 25  % Final   Lymphs Abs 11/23/2020 1.6  0.7 - 4.0 K/uL Final   Monocytes Relative 11/23/2020 10  % Final   Monocytes Absolute 11/23/2020 0.7  0.1 - 1.0 K/uL Final   Eosinophils Relative 11/23/2020 2  % Final   Eosinophils Absolute 11/23/2020 0.1  0.0 - 0.5 K/uL Final   Basophils Relative 11/23/2020 1  % Final   Basophils Absolute 11/23/2020 0.0  0.0 - 0.1 K/uL Final   Immature Granulocytes 11/23/2020 1  % Final   Abs Immature Granulocytes 11/23/2020 0.03  0.00  - 0.07 K/uL Final   Performed at [facility], [place] [address], [place], [place] [address]     X-Rays:No results found.  EKG: Orders placed or performed during the hospital encounter of 12/02/20   EKG 12 lead per protocol   EKG 12 lead per protocol     Hospital Course: Amy Carroll is a 86 y.o. who was admitted to [facility]. They were brought to the operating room on 12/12/2020 and underwent Procedure(s): TOTAL KNEE ARTHROPLASTY.  Patient tolerated the procedure well and was later transferred to the recovery room and then to the orthopaedic floor for postoperative care. They were given PO and IV analgesics for pain control following their surgery. They were given 24 hours of postoperative antibiotics of  Anti-infectives (From admission, onward)    Start     Dose/Rate Route Frequency Ordered Stop   12/12/20 1600  ceFAZolin (ANCEF) IVPB 2g/100 mL premix        2 g 200 mL/hr over 30 Minutes Intravenous Every 6 hours 12/12/20 1229 12/12/20 2209   12/12/20 0700  ceFAZolin (ANCEF) IVPB 2g/100 mL premix        2 g 200 mL/hr over 30 Minutes Intravenous On call to O.R. 12/12/20 2423 12/12/20 0930      and started on DVT prophylaxis in the form of Aspirin.   PT and OT were ordered for total joint protocol. Discharge planning consulted to help with postop disposition and equipment needs. Patient had a good night on the evening of surgery. They started to get up OOB with therapy on POD #0. Continued to work with therapy into POD #2. Dressing was changed on day two and the incision was clean, dry, and intact. Had issues with confusion after taking hydrocodone. This was discontinued, pain controlled with tylenol and tramadol as needed. Pt worked with therapy for two additional sessions and was meeting their goals. She was discharged to [facility] later that day in stable condition.  Diet: Regular diet Activity: WBAT Follow-up: in 2 weeks Disposition:  Skilled nursing facility Discharged Condition: stable   Discharge Instructions     Call MD / Call 911   Complete by: As directed    If you experience chest pain or shortness of breath, CALL 911 and be transported to the hospital emergency room.  If you develope a fever above 101 F, pus (white drainage) or increased drainage  or redness at the wound, or calf pain, call your surgeon's office.   Change dressing   Complete by: As directed    You may remove the bulky bandage (ACE wrap and gauze) two days after surgery. You will have an adhesive waterproof bandage underneath. Leave this in place until your first follow-up appointment.   Constipation Prevention   Complete by: As directed    Drink plenty of fluids.  Prune juice may be helpful.  You may use a stool softener, such as Colace (over the counter) 100 mg twice a day.  Use MiraLax (over the counter) for constipation as needed.   Diet - low sodium heart healthy   Complete by: As directed    Do not put a pillow under the knee. Place it under the heel.   Complete by: As directed    Driving restrictions   Complete by: As directed    No driving for two weeks   Post-operative opioid taper instructions:   Complete by: As directed    POST-OPERATIVE OPIOID TAPER INSTRUCTIONS: It is important to wean off of your opioid medication as soon as possible. If you do not need pain medication after your surgery it is ok to stop day one. Opioids include: Codeine, Hydrocodone(Norco, Vicodin), Oxycodone(Percocet, oxycontin) and hydromorphone amongst others.  Long term and even short term use of opiods can cause: Increased pain response Dependence Constipation Depression Respiratory depression And more.  Withdrawal symptoms can include Flu like symptoms Nausea, vomiting And more Techniques to manage these symptoms Hydrate well Eat regular healthy meals Stay active Use relaxation techniques(deep breathing, meditating, yoga) Do Not substitute  Alcohol to help with tapering If you have been on opioids for less than two weeks and do not have pain than it is ok to stop all together.  Plan to wean off of opioids This plan should start within one week post op of your joint replacement. Maintain the same interval or time between taking each dose and first decrease the dose.  Cut the total daily intake of opioids by one tablet each day Next start to increase the time between doses. The last dose that should be eliminated is the evening dose.      TED hose   Complete by: As directed    Use stockings (TED hose) for three weeks on both leg(s).  You may remove them at night for sleeping.   Weight bearing as tolerated   Complete by: As directed       Allergies as of 12/14/2020   No Known Allergies      Medication List     STOP taking these medications    HYDROcodone-acetaminophen 5-325 MG tablet Commonly known as: NORCO/VICODIN       TAKE these medications    acetaminophen 500 MG tablet Commonly known as: TYLENOL Take 500 mg by mouth every 6 (six) hours as needed for moderate pain.   amLODipine 5 MG tablet Commonly known as: NORVASC Take 5 mg by mouth daily.   aspirin 325 MG EC tablet Take 1 tablet (325 mg total) by mouth 2 (two) times daily for 19 days. Then take one 81 mg aspirin once a day for three weeks. Then discontinue aspirin.   B-12 2500 MCG Tabs Take 2,500 mcg by mouth daily.   cholecalciferol 25 MCG (1000 UNIT) tablet Commonly known as: VITAMIN D3 Take 1,000 Units by mouth daily.   Combigan 0.2-0.5 % ophthalmic solution Generic drug: brimonidine-timolol Place 1 drop into the right eye in  the morning and at bedtime.   dorzolamide 2 % ophthalmic solution Commonly known as: TRUSOPT Place 1 drop into the right eye 2 (two) times daily.   ferrous sulfate 325 (65 FE) MG tablet Take 325 mg by mouth daily with breakfast.   Klor-Con M20 20 MEQ tablet Generic drug: potassium chloride SA TAKE 2 TABLETS  BY MOUTH DAILY   latanoprost 0.005 % ophthalmic solution Commonly known as: XALATAN Place 1 drop into both eyes at bedtime.   methocarbamol 500 MG tablet Commonly known as: ROBAXIN Take 1 tablet (500 mg total) by mouth every 6 (six) hours as needed for muscle spasms.   olmesartan-hydrochlorothiazide 40-25 MG tablet Commonly known as: BENICAR HCT Take 1 tablet by mouth daily.   PRESERVISION AREDS 2 PO Take 1 capsule by mouth in the morning and at bedtime.   Salonpas 3.03-03-08 % Ptch Generic drug: Camphor-Menthol-Methyl Sal Place 1 patch onto the skin daily as needed (knee pain).   traMADol 50 MG tablet Commonly known as: ULTRAM Take 1-2 tablets (50-100 mg total) by mouth every 6 (six) hours as needed for moderate pain.               Discharge Care Instructions  (From admission, onward)           Start     Ordered   12/13/20 0000  Weight bearing as tolerated        12/13/20 0744   12/13/20 0000  Change dressing       Comments: You may remove the bulky bandage (ACE wrap and gauze) two days after surgery. You will have an adhesive waterproof bandage underneath. Leave this in place until your first follow-up appointment.   12/13/20 0744            Contact information for follow-up providers     Gaynelle Arabian, MD. Go on 12/27/2020.   Specialty: Orthopedic Surgery Why: You are scheduled for first post op appointment on Tuesday November 1st at 2:00pm. Contact information: [address] [place] [address] [phone]              Contact information for after-discharge care     Destination     [facility] .   Service: Skilled Nursing Contact information: [place] [phone]                     Signed: Theresa Duty, PA-C Orthopedic Surgery 12/14/2020, 8:49 AM

## 2020-12-14 NOTE — TOC Transition Note (Signed)
Transition of Care [facility]) - CM/SW Discharge Note   Patient Details  Name: Amy Carroll MRN: [id number] Date of Birth: February 05, 1935  [facility] CM/SW Contact:  Lennart Pall, LCSW Phone Number: 12/14/2020, 11:02 AM   Clinical Narrative:    Pt medically cleared for dc today to SNF/ rehab at [facility].  Spouse plans to provide transportation to facility.  RN to call report to [phone].  No further TOC needs.   Final next level of care: Skilled Nursing Facility Barriers to Discharge: No Barriers Identified   Patient Goals and CMS Choice Patient states their goals for this hospitalization and ongoing recovery are:: eventually return to her [place] apt following rehab      Discharge Placement PASRR number recieved: 12/13/20            Patient chooses bed at: [facility] Patient to be transferred to facility by: spouse to transport private vehicle Name of family member notified: spouse    Discharge Plan and Services In-house Referral: Clinical Social Work              DME Arranged: N/A                    Social Determinants of Health (SDOH) Interventions     Readmission Risk Interventions No flowsheet data found.

## 2020-12-14 NOTE — Progress Notes (Signed)
Physical Therapy Treatment Patient Details Name: Amy Carroll MRN: [id number] DOB: Nov 05, 1934 Today's Date: 12/14/2020   History of Present Illness Patient is 86 y.o. female s/p LT TKA on 12/12/20 with PMH significant for OA, HTN, anemia, Rt TKA and revision, Rt THA.    PT Comments    POD # 2 am session General Comments: AxO x 2 hx Dementia and diod require repeat functions VC's 50% of time.  General transfer comment: 50% VC's on proper hand placement as well as LE extension.  Increased assist to complete turns.  Unsteady.  Posterior LOB.  Also assisted with a toilet transfer which required increased assist due to balance deficit. General Gait Details: applied personal shoes and KI to assist pt with amb to andf rom bathroom.  Unsteady with decreased WBing thru L LE and esp unsteady with turns. Performed a few TE's followed by ICE. Pt plans to D/C to SNF for ST Rehab.   Recommendations for follow up therapy are one component of a multi-disciplinary discharge planning process, led by the attending physician.  Recommendations may be updated based on patient status, additional functional criteria and insurance authorization.  Follow Up Recommendations  SNF     Equipment Recommendations  None recommended by PT    Recommendations for Other Services       Precautions / Restrictions Precautions Precautions: Fall Precaution Comments: instructed no pillow under knee Restrictions Weight Bearing Restrictions: No LLE Weight Bearing: Weight bearing as tolerated     Mobility  Bed Mobility               General bed mobility comments: OOB in recliner    Transfers   Equipment used: Rolling walker (2 wheeled) Transfers: Sit to/from [employer] Sit to Stand: Min assist Stand pivot transfers: Min assist;Mod assist       General transfer comment: 50% VC's on proper hand placement as well as LE extension.  Increased assist to complete turns.  Unsteady.   Posterior LOB.  Also assisted with a toilet transfer which required increased assist due to balance deficit.  Ambulation/Gait Ambulation/Gait assistance: Min assist;Min guard Gait Distance (Feet): 22 Feet (to and from bathroom) Assistive device: Rolling walker (2 wheeled) Gait Pattern/deviations: Step-to pattern;Decreased stance time - left Gait velocity: decreased   General Gait Details: applied personal shoes and KI to assist pt with amb to andf rom bathroom.  Unsteady with decreased WBing thru L LE and esp unsteady with turns.   Stairs             Wheelchair Mobility    Modified Rankin (Stroke Patients Only)       Balance                                            Cognition Arousal/Alertness: Awake/alert Behavior During Therapy: WFL for tasks assessed/performed Overall Cognitive Status: Within Functional Limits for tasks assessed                                 General Comments: AxO x 2 hx Dementia and diod require repeat functions VC's 50% of time      Exercises      General Comments        Pertinent Vitals/Pain Pain Assessment: 0-10 Pain Score: 4  Pain Location: L knee Pain Descriptors /  Indicators: Aching;Discomfort;Operative site guarding Pain Intervention(s): Monitored during session;Premedicated before session;Repositioned;Ice applied    Home Living                      Prior Function            PT Goals (current goals can now be found in the care plan section) Progress towards PT goals: Progressing toward goals    Frequency    7X/week      PT Plan Current plan remains appropriate    Co-evaluation              AM-PAC PT "6 Clicks" Mobility   Outcome Measure  Help needed turning from your back to your side while in a flat bed without using bedrails?: A Little Help needed moving from lying on your back to sitting on the side of a flat bed without using bedrails?: A Little Help needed  moving to and from a bed to a chair (including a wheelchair)?: A Little Help needed standing up from a chair using your arms (e.g., wheelchair or bedside chair)?: A Lot Help needed to walk in hospital room?: A Lot Help needed climbing 3-5 steps with a railing? : A Lot 6 Click Score: 15    End of Session Equipment Utilized During Treatment: Gait belt Activity Tolerance: Patient tolerated treatment well Patient left: in chair;with call bell/phone within reach;with chair alarm set Nurse Communication: Mobility status PT Visit Diagnosis: Muscle weakness (generalized) (M62.81);Difficulty in walking, not elsewhere classified (R26.2)     Time: 1610-9604 PT Time Calculation (min) (ACUTE ONLY): 25 min  Charges:  $Gait Training: 8-22 mins $Therapeutic Exercise: 8-22 mins                     {Markesha Hannig  PTA Acute  Rehabilitation Services Pager      [phone] Office      [phone]

## 2020-12-14 NOTE — Progress Notes (Signed)
RN called report to [facility]. All questions answered. AVS given for patient use/packet given to husband for [facility].  NT assisted patient to personal vehicle, and patient was transported with husband to [employer]   [place], Therapist, sports

## 2020-12-14 NOTE — Progress Notes (Signed)
Patient was confused at hs, but is oriented this a.m. No narcotics given this shift.

## 2020-12-15 ENCOUNTER — Encounter: Payer: Self-pay | Admitting: Adult Health

## 2020-12-15 ENCOUNTER — Non-Acute Institutional Stay (SKILLED_NURSING_FACILITY): Payer: Medicare Other | Admitting: Adult Health

## 2020-12-15 DIAGNOSIS — E538 Deficiency of other specified B group vitamins: Secondary | ICD-10-CM | POA: Diagnosis not present

## 2020-12-15 DIAGNOSIS — R41840 Attention and concentration deficit: Secondary | ICD-10-CM | POA: Diagnosis not present

## 2020-12-15 DIAGNOSIS — M25562 Pain in left knee: Secondary | ICD-10-CM | POA: Diagnosis not present

## 2020-12-15 DIAGNOSIS — D508 Other iron deficiency anemias: Secondary | ICD-10-CM | POA: Diagnosis not present

## 2020-12-15 DIAGNOSIS — Z96652 Presence of left artificial knee joint: Secondary | ICD-10-CM

## 2020-12-15 DIAGNOSIS — R2681 Unsteadiness on feet: Secondary | ICD-10-CM | POA: Diagnosis not present

## 2020-12-15 DIAGNOSIS — R413 Other amnesia: Secondary | ICD-10-CM | POA: Diagnosis not present

## 2020-12-15 DIAGNOSIS — M1712 Unilateral primary osteoarthritis, left knee: Secondary | ICD-10-CM | POA: Diagnosis not present

## 2020-12-15 DIAGNOSIS — I10 Essential (primary) hypertension: Secondary | ICD-10-CM

## 2020-12-15 DIAGNOSIS — R2689 Other abnormalities of gait and mobility: Secondary | ICD-10-CM | POA: Diagnosis not present

## 2020-12-15 DIAGNOSIS — M6389 Disorders of muscle in diseases classified elsewhere, multiple sites: Secondary | ICD-10-CM | POA: Diagnosis not present

## 2020-12-15 DIAGNOSIS — R278 Other lack of coordination: Secondary | ICD-10-CM | POA: Diagnosis not present

## 2020-12-15 DIAGNOSIS — M62562 Muscle wasting and atrophy, not elsewhere classified, left lower leg: Secondary | ICD-10-CM | POA: Diagnosis not present

## 2020-12-15 NOTE — Progress Notes (Signed)
Location:  [facility]upational psychologist of Service:  SNF (31) Provider:   Cindi Carbon, [place] [phone]   Merrilee Seashore, MD  Patient Care Team: Merrilee Seashore, MD as PCP - General (Internal Medicine)  Extended Emergency Contact Information Primary Emergency Contact: Liebler,[name] Address: [address]          [place], [place] [address] [place] Phone: [phone] Mobile Phone: [phone] Relation: Spouse  Code Status:   Goals of care: Advanced Directive information Advanced Directives 12/12/2020  Does Patient Have a Medical Advance Directive? Yes  Type of Advance Directive Living will;Healthcare Power of Attorney  Does patient want to make changes to medical advance directive? No - Patient declined  Copy of [place] in Chart? No - copy requested  Would patient like information on creating a medical advance directive? -  Pre-existing out of facility DNR order (yellow form or pink MOST form) -     Chief Complaint  Patient presents with   Hospitalization Follow-up    HPI:  Pt is a 86 y.o. female seen today for a hospital f/u s/p admission from 12/12/20-12/14/20 for left knee OA s/p let total knee arthroplasty.  She received an iron infusion prior to surgery. Has anemia that appears to be multifactorial per notes with iron def and B12 def. Stool was negative for blood during workup.  Nursing reports pain is well controlled She is eating and drinking. Reports a BM two days ago Has swelling in the left knee that she says is improved since surgery.  Pain is controlled Nursing reporting short term memory loss. No reported hx of dementia. No delusions, agitation, etc.   Past Medical History:  Diagnosis Date   Anemia    Arthritis    Gait abnormality 06/20/2020   Hypertension    Past Surgical History:  Procedure Laterality Date   ABDOMINAL HYSTERECTOMY     COLONOSCOPY      HEMANGIOMA EXCISION     JOINT REPLACEMENT     TONSILLECTOMY     TOTAL HIP ARTHROPLASTY Right 02/18/2019   Procedure: TOTAL HIP ARTHROPLASTY ANTERIOR APPROACH;  Surgeon: Gaynelle Arabian, MD;  Location: [facility];  Service: Orthopedics;  Laterality: Right;  157min   TOTAL KNEE ARTHROPLASTY Right 05/27/2012   Procedure: TOTAL KNEE ARTHROPLASTY;  Surgeon: Meredith Pel, MD;  Location: [facility];  Service: Orthopedics;  Laterality: Right;  Right Total Knee Arthroplasty   TOTAL KNEE ARTHROPLASTY Left 12/12/2020   Procedure: TOTAL KNEE ARTHROPLASTY;  Surgeon: Gaynelle Arabian, MD;  Location: [facility];  Service: Orthopedics;  Laterality: Left;   TOTAL KNEE REVISION Right 08/06/2018   Procedure: TOTAL KNEE REVISION;  Surgeon: Gaynelle Arabian, MD;  Location: [facility];  Service: Orthopedics;  Laterality: Right;   TUBAL LIGATION      No Known Allergies  Outpatient Encounter Medications as of 12/15/2020  Medication Sig   acetaminophen (TYLENOL) 500 MG tablet Take 500 mg by mouth every 6 (six) hours as needed for moderate pain.   amLODipine (NORVASC) 5 MG tablet Take 5 mg by mouth daily.    aspirin 325 MG EC tablet Take 1 tablet (325 mg total) by mouth 2 (two) times daily for 19 days. Then take one 81 mg aspirin once a day for three weeks. Then discontinue aspirin.   Camphor-Menthol-Methyl Sal (SALONPAS) 3.03-03-08 % PTCH Place 1 patch onto the skin daily as needed (knee pain).   cholecalciferol (VITAMIN D3) 25 MCG (1000 UT) tablet Take  1,000 Units by mouth daily.   COMBIGAN 0.2-0.5 % ophthalmic solution Place 1 drop into the right eye in the morning and at bedtime.   Cyanocobalamin (B-12) 2500 MCG TABS Take 2,500 mcg by mouth daily.   dorzolamide (TRUSOPT) 2 % ophthalmic solution Place 1 drop into the right eye 2 (two) times daily.    ferrous sulfate 325 (65 FE) MG tablet Take 325 mg by mouth daily with breakfast.   KLOR-CON M20 20 MEQ tablet TAKE 2 TABLETS BY MOUTH DAILY   latanoprost (XALATAN) 0.005 % ophthalmic  solution Place 1 drop into both eyes at bedtime.    methocarbamol (ROBAXIN) 500 MG tablet Take 1 tablet (500 mg total) by mouth every 6 (six) hours as needed for muscle spasms.   Multiple Vitamins-Minerals (PRESERVISION AREDS 2 PO) Take 1 capsule by mouth in the morning and at bedtime.   olmesartan-hydrochlorothiazide (BENICAR HCT) 40-25 MG tablet Take 1 tablet by mouth daily.   traMADol (ULTRAM) 50 MG tablet Take 1-2 tablets (50-100 mg total) by mouth every 6 (six) hours as needed for moderate pain.   No facility-administered encounter medications on file as of 12/15/2020.    Review of Systems  Constitutional:  Negative for activity change, appetite change, chills, diaphoresis, fatigue, fever and unexpected weight change.  HENT:  Negative for congestion.   Respiratory:  Negative for cough, shortness of breath and wheezing.   Cardiovascular:  Positive for leg swelling. Negative for chest pain and palpitations.  Gastrointestinal:  Negative for abdominal distention, abdominal pain, constipation and diarrhea.  Genitourinary:  Negative for difficulty urinating and dysuria.  Musculoskeletal:  Positive for gait problem. Negative for arthralgias, back pain, joint swelling and myalgias.  Neurological:  Negative for dizziness, tremors, seizures, syncope, facial asymmetry, speech difficulty, weakness, light-headedness, numbness and headaches.  Psychiatric/Behavioral:  Negative for agitation, behavioral problems and confusion.        Short term memory loss   Immunization History  Administered Date(s) Administered   Influenza-Unspecified 11/26/2017   Pertinent  Health Maintenance Due  Topic Date Due   DEXA SCAN  Never done   INFLUENZA VACCINE  09/26/2020   No flowsheet data found. Functional Status Survey:    Vitals:   12/15/20 1557  BP: 118/67  Pulse: 83  Resp: 18  Temp: 99.2 F (37.3 C)  SpO2: 97%   There is no height or weight on file to calculate BMI. Physical Exam Vitals and  nursing note reviewed.  Constitutional:      General: She is not in acute distress.    Appearance: She is not diaphoretic.  HENT:     Head: Normocephalic and atraumatic.     Mouth/Throat:     Mouth: Mucous membranes are moist.     Pharynx: Oropharynx is clear.  Eyes:     Conjunctiva/sclera: Conjunctivae normal.     Pupils: Pupils are equal, round, and reactive to light.  Neck:     Vascular: No JVD.  Cardiovascular:     Rate and Rhythm: Normal rate and regular rhythm.     Heart sounds: No murmur heard. Pulmonary:     Effort: Pulmonary effort is normal. No respiratory distress.     Breath sounds: Normal breath sounds. No wheezing.  Abdominal:     General: Bowel sounds are normal. There is no distension.     Palpations: Abdomen is soft.     Tenderness: There is no abdominal tenderness.  Musculoskeletal:        General: Swelling (left knee with  mild warm. Dressing CDI) present.     Right lower leg: No edema.     Left lower leg: No edema.     Comments: +CMS to LLE  Skin:    General: Skin is warm and dry.  Neurological:     Mental Status: She is alert and oriented to person, place, and time.  Psychiatric:        Mood and Affect: Mood normal.    Labs reviewed: Recent Labs    12/02/20 1443 12/13/20 0317 12/14/20 0325  NA 144 134* 135  K 4.2 4.5 4.0  CL 106 107 107  CO2 29 21* 23  GLUCOSE 94 105* 89  BUN 26* 19 15  CREATININE 1.01* 0.77 0.80  CALCIUM 8.9 8.0* 8.2*   Recent Labs    11/23/20 1434 12/02/20 1443  AST 11* 14*  ALT 10 12  ALKPHOS 81 66  BILITOT 0.4 0.3  PROT 7.2 7.1  ALBUMIN 3.2* 3.4*   Recent Labs    11/23/20 1434 12/02/20 1443 12/13/20 0317 12/14/20 0325  WBC 6.3 5.5 8.0 7.6  NEUTROABS 3.9  --   --   --   HGB 9.8* 10.3* 9.1* 9.4*  HCT 29.6* 32.8* 27.8* 29.1*  MCV 92.5 96.5 96.9 96.4  PLT 347 359 262 264   No results found for: TSH No results found for: HGBA1C No results found for: CHOL, HDL, LDLCALC, LDLDIRECT, TRIG,  CHOLHDL  Significant Diagnostic Results in last 30 days:  No results found.  Assessment/Plan 1. Primary osteoarthritis of left knee Led to #2  2. Status post total left knee replacement WBAT PT and OT ASA bid for 19 more days Ted hose IS QID x 10 breaths (has slight low grade temp) Continue Tylenol and ultram for pain control   3. Other iron deficiency anemia S/p iron infusion heme/onc Continue iron supplement Check CBC   4. Short-term memory loss Nursing staff are reporting this, not sure if this is new or a mild delirium after surgery. Will monitor, staff will do MMSE   5. Essential hypertension Controlled with Benicar HCT  6. B12 deficiency Continue B12 2500 mcg qd    Family/ staff Communication: nurse and resident   Labs/tests ordered:  CBC 10/24

## 2020-12-16 DIAGNOSIS — M25562 Pain in left knee: Secondary | ICD-10-CM | POA: Diagnosis not present

## 2020-12-16 DIAGNOSIS — M6389 Disorders of muscle in diseases classified elsewhere, multiple sites: Secondary | ICD-10-CM | POA: Diagnosis not present

## 2020-12-16 DIAGNOSIS — R2681 Unsteadiness on feet: Secondary | ICD-10-CM | POA: Diagnosis not present

## 2020-12-16 DIAGNOSIS — R2689 Other abnormalities of gait and mobility: Secondary | ICD-10-CM | POA: Diagnosis not present

## 2020-12-16 DIAGNOSIS — R41840 Attention and concentration deficit: Secondary | ICD-10-CM | POA: Diagnosis not present

## 2020-12-16 DIAGNOSIS — Z96652 Presence of left artificial knee joint: Secondary | ICD-10-CM | POA: Diagnosis not present

## 2020-12-16 DIAGNOSIS — M62562 Muscle wasting and atrophy, not elsewhere classified, left lower leg: Secondary | ICD-10-CM | POA: Diagnosis not present

## 2020-12-16 DIAGNOSIS — R278 Other lack of coordination: Secondary | ICD-10-CM | POA: Diagnosis not present

## 2020-12-19 DIAGNOSIS — M62562 Muscle wasting and atrophy, not elsewhere classified, left lower leg: Secondary | ICD-10-CM | POA: Diagnosis not present

## 2020-12-19 DIAGNOSIS — R2681 Unsteadiness on feet: Secondary | ICD-10-CM | POA: Diagnosis not present

## 2020-12-19 DIAGNOSIS — D500 Iron deficiency anemia secondary to blood loss (chronic): Secondary | ICD-10-CM | POA: Diagnosis not present

## 2020-12-19 DIAGNOSIS — R2689 Other abnormalities of gait and mobility: Secondary | ICD-10-CM | POA: Diagnosis not present

## 2020-12-19 DIAGNOSIS — M25562 Pain in left knee: Secondary | ICD-10-CM | POA: Diagnosis not present

## 2020-12-19 DIAGNOSIS — Z96652 Presence of left artificial knee joint: Secondary | ICD-10-CM | POA: Diagnosis not present

## 2020-12-19 DIAGNOSIS — D508 Other iron deficiency anemias: Secondary | ICD-10-CM | POA: Diagnosis not present

## 2020-12-19 LAB — CBC AND DIFFERENTIAL
HCT: 26 — AB (ref 36–46)
Hemoglobin: 9.2 — AB (ref 12.0–16.0)
Platelets: 268 (ref 150–399)
WBC: 10.4

## 2020-12-19 LAB — CBC: RBC: 2.83 — AB (ref 3.87–5.11)

## 2020-12-20 DIAGNOSIS — Z96652 Presence of left artificial knee joint: Secondary | ICD-10-CM | POA: Diagnosis not present

## 2020-12-20 DIAGNOSIS — M62562 Muscle wasting and atrophy, not elsewhere classified, left lower leg: Secondary | ICD-10-CM | POA: Diagnosis not present

## 2020-12-20 DIAGNOSIS — M6389 Disorders of muscle in diseases classified elsewhere, multiple sites: Secondary | ICD-10-CM | POA: Diagnosis not present

## 2020-12-20 DIAGNOSIS — R278 Other lack of coordination: Secondary | ICD-10-CM | POA: Diagnosis not present

## 2020-12-20 DIAGNOSIS — R2689 Other abnormalities of gait and mobility: Secondary | ICD-10-CM | POA: Diagnosis not present

## 2020-12-20 DIAGNOSIS — R41840 Attention and concentration deficit: Secondary | ICD-10-CM | POA: Diagnosis not present

## 2020-12-20 DIAGNOSIS — M25562 Pain in left knee: Secondary | ICD-10-CM | POA: Diagnosis not present

## 2020-12-20 DIAGNOSIS — R2681 Unsteadiness on feet: Secondary | ICD-10-CM | POA: Diagnosis not present

## 2020-12-21 ENCOUNTER — Emergency Department (HOSPITAL_BASED_OUTPATIENT_CLINIC_OR_DEPARTMENT_OTHER): Payer: Medicare Other | Admitting: Radiology

## 2020-12-21 ENCOUNTER — Encounter (HOSPITAL_BASED_OUTPATIENT_CLINIC_OR_DEPARTMENT_OTHER): Payer: Self-pay | Admitting: Obstetrics and Gynecology

## 2020-12-21 ENCOUNTER — Observation Stay (HOSPITAL_BASED_OUTPATIENT_CLINIC_OR_DEPARTMENT_OTHER)
Admission: EM | Admit: 2020-12-21 | Discharge: 2020-12-23 | Disposition: A | Discharge status: Home / Self Care | Payer: Medicare Other | Attending: Family Medicine | Admitting: Family Medicine

## 2020-12-21 ENCOUNTER — Other Ambulatory Visit: Payer: Self-pay

## 2020-12-21 ENCOUNTER — Emergency Department (HOSPITAL_BASED_OUTPATIENT_CLINIC_OR_DEPARTMENT_OTHER): Payer: Medicare Other

## 2020-12-21 DIAGNOSIS — R41840 Attention and concentration deficit: Secondary | ICD-10-CM | POA: Diagnosis not present

## 2020-12-21 DIAGNOSIS — R2689 Other abnormalities of gait and mobility: Secondary | ICD-10-CM | POA: Diagnosis not present

## 2020-12-21 DIAGNOSIS — M6389 Disorders of muscle in diseases classified elsewhere, multiple sites: Secondary | ICD-10-CM | POA: Diagnosis not present

## 2020-12-21 DIAGNOSIS — Z87891 Personal history of nicotine dependence: Secondary | ICD-10-CM | POA: Diagnosis not present

## 2020-12-21 DIAGNOSIS — M62562 Muscle wasting and atrophy, not elsewhere classified, left lower leg: Secondary | ICD-10-CM | POA: Diagnosis not present

## 2020-12-21 DIAGNOSIS — Z96653 Presence of artificial knee joint, bilateral: Secondary | ICD-10-CM | POA: Diagnosis not present

## 2020-12-21 DIAGNOSIS — E871 Hypo-osmolality and hyponatremia: Secondary | ICD-10-CM | POA: Diagnosis not present

## 2020-12-21 DIAGNOSIS — Z79899 Other long term (current) drug therapy: Secondary | ICD-10-CM | POA: Insufficient documentation

## 2020-12-21 DIAGNOSIS — R600 Localized edema: Secondary | ICD-10-CM | POA: Diagnosis not present

## 2020-12-21 DIAGNOSIS — M7989 Other specified soft tissue disorders: Secondary | ICD-10-CM | POA: Diagnosis not present

## 2020-12-21 DIAGNOSIS — Z96652 Presence of left artificial knee joint: Secondary | ICD-10-CM | POA: Diagnosis not present

## 2020-12-21 DIAGNOSIS — Z20822 Contact with and (suspected) exposure to covid-19: Secondary | ICD-10-CM | POA: Insufficient documentation

## 2020-12-21 DIAGNOSIS — Z7982 Long term (current) use of aspirin: Secondary | ICD-10-CM | POA: Insufficient documentation

## 2020-12-21 DIAGNOSIS — R2681 Unsteadiness on feet: Secondary | ICD-10-CM | POA: Diagnosis not present

## 2020-12-21 DIAGNOSIS — I10 Essential (primary) hypertension: Secondary | ICD-10-CM | POA: Diagnosis not present

## 2020-12-21 DIAGNOSIS — M25562 Pain in left knee: Secondary | ICD-10-CM | POA: Diagnosis not present

## 2020-12-21 DIAGNOSIS — Z96641 Presence of right artificial hip joint: Secondary | ICD-10-CM | POA: Diagnosis not present

## 2020-12-21 DIAGNOSIS — R278 Other lack of coordination: Secondary | ICD-10-CM | POA: Diagnosis not present

## 2020-12-21 DIAGNOSIS — M25462 Effusion, left knee: Secondary | ICD-10-CM | POA: Diagnosis not present

## 2020-12-21 LAB — CBC WITH DIFFERENTIAL/PLATELET
Abs Immature Granulocytes: 0.28 10*3/uL — ABNORMAL HIGH (ref 0.00–0.07)
Basophils Absolute: 0.1 10*3/uL (ref 0.0–0.1)
Basophils Relative: 0 %
Eosinophils Absolute: 0.1 10*3/uL (ref 0.0–0.5)
Eosinophils Relative: 1 %
HCT: 31.9 % — ABNORMAL LOW (ref 36.0–46.0)
Hemoglobin: 10.4 g/dL — ABNORMAL LOW (ref 12.0–15.0)
Immature Granulocytes: 2 %
Lymphocytes Relative: 9 %
Lymphs Abs: 1 10*3/uL (ref 0.7–4.0)
MCH: 30.8 pg (ref 26.0–34.0)
MCHC: 32.6 g/dL (ref 30.0–36.0)
MCV: 94.4 fL (ref 80.0–100.0)
Monocytes Absolute: 0.9 10*3/uL (ref 0.1–1.0)
Monocytes Relative: 8 %
Neutro Abs: 9.2 10*3/uL — ABNORMAL HIGH (ref 1.7–7.7)
Neutrophils Relative %: 80 %
Platelets: 349 10*3/uL (ref 150–400)
RBC: 3.38 MIL/uL — ABNORMAL LOW (ref 3.87–5.11)
RDW: 14.6 % (ref 11.5–15.5)
WBC: 11.6 10*3/uL — ABNORMAL HIGH (ref 4.0–10.5)
nRBC: 0 % (ref 0.0–0.2)

## 2020-12-21 LAB — COMPREHENSIVE METABOLIC PANEL
ALT: 27 U/L (ref 0–44)
AST: 21 U/L (ref 15–41)
Albumin: 3.4 g/dL — ABNORMAL LOW (ref 3.5–5.0)
Alkaline Phosphatase: 122 U/L (ref 38–126)
Anion gap: 7 (ref 5–15)
BUN: 13 mg/dL (ref 8–23)
CO2: 24 mmol/L (ref 22–32)
Calcium: 8.5 mg/dL — ABNORMAL LOW (ref 8.9–10.3)
Chloride: 94 mmol/L — ABNORMAL LOW (ref 98–111)
Creatinine, Ser: 0.85 mg/dL (ref 0.44–1.00)
GFR, Estimated: >60 mL/min (ref >60)
Glucose, Bld: 119 mg/dL — ABNORMAL HIGH (ref 70–99)
Potassium: 4.6 mmol/L (ref 3.5–5.1)
Sodium: 125 mmol/L — ABNORMAL LOW (ref 135–145)
Total Bilirubin: 0.5 mg/dL (ref 0.3–1.2)
Total Protein: 6.9 g/dL (ref 6.5–8.1)

## 2020-12-21 LAB — MAGNESIUM: Magnesium: 1.8 mg/dL (ref 1.7–2.4)

## 2020-12-21 LAB — PHOSPHORUS: Phosphorus: 2.2 mg/dL — ABNORMAL LOW (ref 2.5–4.6)

## 2020-12-21 LAB — RESP PANEL BY RT-PCR (FLU A&B, COVID) ARPGX2
Influenza A by PCR: NEGATIVE
Influenza B by PCR: NEGATIVE
SARS Coronavirus 2 by RT PCR: NEGATIVE

## 2020-12-21 LAB — LACTIC ACID, PLASMA: Lactic Acid, Venous: 0.7 mmol/L (ref 0.5–1.9)

## 2020-12-21 NOTE — ED Provider Notes (Signed)
[facility] Provider Note   CSN: [id number] Arrival date & time: 12/21/20  1643     History Chief Complaint  Patient presents with   Leg Pain    Amy Carroll is a 86 y.o. female.   Leg Pain Associated symptoms: fatigue   Associated symptoms: no back pain, no fever and no neck pain   Patient presents for left knee pain and swelling.  She is currently postop day 9 from a total knee arthroplasty.  She was discharged from the hospital 1 week ago.  She has been undergoing physical therapy.  2 days ago, she was doing a physical therapy session and was instructed to place all of her weight onto her left lower extremity.  When she did this, she experienced a sharp pain in the area of her medial left knee.  This area has been painful since that time.  Patient also feels that the swelling has increased.  She denies any systemic symptoms: Fevers, chills, nausea.  At rest, while sitting, patient has minimal pain in the area of her knee.  Pain is worsened when she does put any weight onto her left leg.    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   Allergic contact dermatitis due to metals 03/17/2018   Presence of right artificial knee joint 04/02/2016   Effusion, right knee 04/02/2016   Osteoarthritis of right knee 05/27/2012    Class: Diagnosis of    Past Surgical History:  Procedure Laterality Date   ABDOMINAL HYSTERECTOMY     COLONOSCOPY     HEMANGIOMA EXCISION      JOINT REPLACEMENT     TONSILLECTOMY     TOTAL HIP ARTHROPLASTY Right 02/18/2019   Procedure: TOTAL HIP ARTHROPLASTY ANTERIOR APPROACH;  Surgeon: Gaynelle Arabian, MD;  Location: [facility];  Service: Orthopedics;  Laterality: Right;  196min   TOTAL KNEE ARTHROPLASTY Right 05/27/2012   Procedure: TOTAL KNEE ARTHROPLASTY;  Surgeon: Meredith Pel, MD;  Location: [facility];  Service: Orthopedics;  Laterality: Right;  Right Total Knee Arthroplasty   TOTAL KNEE ARTHROPLASTY Left 12/12/2020   Procedure: TOTAL KNEE ARTHROPLASTY;  Surgeon: Gaynelle Arabian, MD;  Location: [facility];  Service: Orthopedics;  Laterality: Left;   TOTAL KNEE REVISION Right 08/06/2018   Procedure: TOTAL KNEE REVISION;  Surgeon: Gaynelle Arabian, MD;  Location: [facility];  Service: Orthopedics;  Laterality: Right;   TUBAL LIGATION       OB History   No obstetric history on file.     Family History  Problem Relation Age of Onset   Cancer Mother    Heart disease Father    Cancer Brother    Asthma Brother     Social History   Tobacco Use   Smoking status: Former    Types: Cigarettes    Quit date: 1999    Years since quitting: 23.8   Smokeless tobacco: Never  Vaping Use   Vaping Use: Never used  Substance Use Topics   Alcohol  use: Yes    Alcohol/week: 1.0 standard drink    Types: 1 Glasses of wine per week    Comment: daily   Drug use: No    Home Medications Prior to Admission medications   Medication Sig Start Date End Date Taking? Authorizing Provider  acetaminophen (TYLENOL) 500 MG tablet Take 500 mg by mouth every 6 (six) hours as needed for moderate pain.    [provider]  amLODipine (NORVASC) 5 MG tablet Take 5 mg by mouth daily.  02/06/16   [provider]  aspirin 325 MG EC tablet Take 1 tablet (325 mg total) by mouth 2 (two) times daily for 19 days. Then take one 81 mg aspirin once a day for three weeks. Then discontinue aspirin. 12/14/20 01/02/21  Edmisten, Kristie L, PA   Camphor-Menthol-Methyl Sal (SALONPAS) 3.03-03-08 % PTCH Place 1 patch onto the skin daily as needed (knee pain).    [provider]  cholecalciferol (VITAMIN D3) 25 MCG (1000 UT) tablet Take 1,000 Units by mouth daily.    [provider]  COMBIGAN 0.2-0.5 % ophthalmic solution Place 1 drop into the right eye in the morning and at bedtime. 01/24/18   [provider]  Cyanocobalamin (B-12) 2500 MCG TABS Take 2,500 mcg by mouth daily.    [provider]  dorzolamide (TRUSOPT) 2 % ophthalmic solution Place 1 drop into the right eye 2 (two) times daily.  01/03/18   [provider]  ferrous sulfate 325 (65 FE) MG tablet Take 325 mg by mouth daily with breakfast.    [provider]  KLOR-CON M20 20 MEQ tablet TAKE 2 TABLETS BY MOUTH DAILY 12/07/20   Brunetta Genera, MD  latanoprost (XALATAN) 0.005 % ophthalmic solution Place 1 drop into both eyes at bedtime.  01/24/18   [provider]  methocarbamol (ROBAXIN) 500 MG tablet Take 1 tablet (500 mg total) by mouth every 6 (six) hours as needed for muscle spasms. 12/14/20   Edmisten, Ok Anis, PA  Multiple Vitamins-Minerals (PRESERVISION AREDS 2 PO) Take 1 capsule by mouth in the morning and at bedtime.    [provider]  olmesartan-hydrochlorothiazide (BENICAR HCT) 40-25 MG tablet Take 1 tablet by mouth daily.    [provider]  traMADol (ULTRAM) 50 MG tablet Take 1-2 tablets (50-100 mg total) by mouth every 6 (six) hours as needed for moderate pain. 12/14/20   Edmisten, Ok Anis, PA    Allergies    Patient has no known allergies.  Review of Systems   Review of Systems  Constitutional:  Positive for fatigue. Negative for chills and fever.  HENT:  Negative for ear pain and sore throat.   Eyes:  Negative for pain and visual disturbance.  Respiratory:  Negative for cough and shortness of breath.   Cardiovascular:  Negative for chest pain and palpitations.   Gastrointestinal:  Negative for abdominal pain, diarrhea, nausea and vomiting.  Genitourinary:  Negative for dysuria, flank pain, hematuria and pelvic pain.  Musculoskeletal:  Positive for arthralgias, gait problem and joint swelling. Negative for back pain, myalgias and neck pain.  Skin:  Negative for color change and rash.  Neurological:  Negative for dizziness, seizures, syncope, weakness, light-headedness, numbness and headaches.  Psychiatric/Behavioral:  Negative for confusion and decreased concentration.   All other systems reviewed and are negative.  Physical Exam Updated Vital Signs BP 119/73   Pulse 86   Temp 98 F (36.7 C)   Resp 16   Ht 5\' 3"  (  1.6 m)   Wt 55.8 kg   SpO2 98%   BMI 21.79 kg/m   Physical Exam Vitals and nursing note reviewed.  Constitutional:      General: She is not in acute distress.    Appearance: Normal appearance. She is well-developed. She is not ill-appearing, toxic-appearing or diaphoretic.  HENT:     Head: Normocephalic and atraumatic.     Right Ear: External ear normal.     Left Ear: External ear normal.     Nose: Nose normal.  Eyes:     Extraocular Movements: Extraocular movements intact.     Conjunctiva/sclera: Conjunctivae normal.  Cardiovascular:     Rate and Rhythm: Normal rate and regular rhythm.     Heart sounds: No murmur heard. Pulmonary:     Effort: Pulmonary effort is normal. No respiratory distress.     Breath sounds: Normal breath sounds.  Abdominal:     Palpations: Abdomen is soft.     Tenderness: There is no abdominal tenderness.  Musculoskeletal:        General: Swelling and tenderness present.     Cervical back: Normal range of motion and neck supple.  Skin:    General: Skin is warm and dry.     Coloration: Skin is not jaundiced or pale.  Neurological:     General: No focal deficit present.     Mental Status: She is alert and oriented to person, place, and time.     Cranial Nerves: No cranial nerve deficit.      Sensory: No sensory deficit.     Motor: No weakness.     Coordination: Coordination normal.  Psychiatric:        Mood and Affect: Mood normal.        Behavior: Behavior normal.        Thought Content: Thought content normal.        Judgment: Judgment normal.    ED Results / Procedures / Treatments   Labs (all labs ordered are listed, but only abnormal results are displayed) Labs Reviewed  COMPREHENSIVE METABOLIC PANEL - Abnormal; Notable for the following components:      Result Value   Sodium 125 (*)    Chloride 94 (*)    Glucose, Bld 119 (*)    Calcium 8.5 (*)    Albumin 3.4 (*)    All other components within normal limits  CBC WITH DIFFERENTIAL/PLATELET - Abnormal; Notable for the following components:   WBC 11.6 (*)    RBC 3.38 (*)    Hemoglobin 10.4 (*)    HCT 31.9 (*)    Neutro Abs 9.2 (*)    Abs Immature Granulocytes 0.28 (*)    All other components within normal limits  PHOSPHORUS - Abnormal; Notable for the following components:   Phosphorus 2.2 (*)    All other components within normal limits  RESP PANEL BY RT-PCR (FLU A&B, COVID) ARPGX2  LACTIC ACID, PLASMA  MAGNESIUM  LACTIC ACID, PLASMA  URINALYSIS, ROUTINE W REFLEX MICROSCOPIC  NA AND K (SODIUM & POTASSIUM), RAND UR  TSH  OSMOLALITY  BASIC METABOLIC PANEL    EKG None  Radiology DG Knee 2 Views Left  Result Date: 12/21/2020 CLINICAL DATA:  Knee replacement on Monday presenting with significant pain and swelling to the calf with pain radiating to the upper thigh and down to the ankle. EXAM: LEFT KNEE - 1-2 VIEW COMPARISON:  Left knee radiograph 10/21/2017 FINDINGS: Diffuse osteopenia. Interval postoperative changes of left total knee arthroplasty  with cemented femoral and tibial components. Hardware is intact and in appropriate position without evidence of complication. Moderate suprapatellar knee joint effusion. Diffuse soft tissue swelling about the knee. Benign-appearing oblong lucent lesion in the  distal femoral shaft with narrow transition zone and sharp margins, possibly surgery related. IMPRESSION: Interval postoperative changes of left total knee arthroplasty. Hardware is intact without evidence of complication. Moderate suprapatellar left knee joint effusion. Soft tissue swelling around the knee. Electronically Signed   By: Ileana Roup M.D.   On: 12/21/2020 17:47   US Venous Img Lower Unilateral Left  Result Date: 12/21/2020 CLINICAL DATA:  Left total knee arthroplasty with knee and calf edema. EXAM: LEFT LOWER EXTREMITY VENOUS DOPPLER ULTRASOUND TECHNIQUE: Gray-scale sonography with compression, as well as color and duplex ultrasound, were performed to evaluate the deep venous system(s) from the level of the common femoral vein through the popliteal and proximal calf veins. COMPARISON:  None. FINDINGS: VENOUS Normal compressibility of the common femoral, superficial femoral, and popliteal veins, as well as the visualized calf veins. Visualized portions of profunda femoral vein and great saphenous vein unremarkable. No filling defects to suggest DVT on grayscale or color Doppler imaging. Doppler waveforms show normal direction of venous flow, normal respiratory plasticity and response to augmentation. Limited views of the contralateral common femoral vein are unremarkable. OTHER None. Limitations: none IMPRESSION: Negative. Electronically Signed   By: Virgina Norfolk M.D.   On: 12/21/2020 19:46    Procedures Procedures   Medications Ordered in ED Medications  HYDROcodone-acetaminophen (NORCO/VICODIN) 5-325 MG per tablet 1 tablet (has no administration in time range)    ED Course  I have reviewed the triage vital signs and the nursing notes.  Pertinent labs & imaging results that were available during my care of the patient were reviewed by me and considered in my medical decision making (see chart for details).    MDM Rules/Calculators/A&P                          Patient  presents for left knee pain for the past 2 days.  She had an acute onset of pain while doing physical therapy and putting her weight onto her left leg.  On arrival in the ED, she is afebrile.  Prior to being bedded in the ED, DVT study of left leg was ordered.  Results were negative.  Lab work was also obtained which showed an incidental finding of hyponatremia.  Patient has had an acute drop in her sodium from 135 to 125 over the past 8 days.  She states that she has been eating very little.  She has continued to drink fluids.  She has not had any recent medication changes.  She does appear hydrated on exam.  Patient was told to fluid restrict.  She was agreeable to hospital admission.  Urine studies were ordered.  Patient was admitted to hospitalist for further management.  Final Clinical Impression(s) / ED Diagnoses Final diagnoses:  Hyponatremia    Rx / DC Orders ED Discharge Orders     None        Godfrey Pick, MD 12/22/20 [phone]

## 2020-12-21 NOTE — ED Triage Notes (Signed)
Patient reports she had a knee replacement on Monday done by Dr. Maureen Ralphs. Patient presents today with a significant pain and swelling to the calf, tender to touch and pain radiation to upper thigh down to ankle.

## 2020-12-21 NOTE — Plan of Care (Addendum)
86 yo F coming in with post op knee pain.  Has hyponatremia to 125.  Poor PO intake lately per EDP.  Asymptomatic.  Urine studies pending.  EDP wants to fluid restrict for now.  Will put in for med-surg bed.  [facility] will assume care on arrival to accepting facility. Until arrival, care as per EDP. However, [facility] available 24/7 for questions and assistance.  Nursing staff please page [facility] and Consults [phone]) as soon as the patient arrives the hospital.

## 2020-12-22 DIAGNOSIS — Z96641 Presence of right artificial hip joint: Secondary | ICD-10-CM | POA: Diagnosis not present

## 2020-12-22 DIAGNOSIS — Z79899 Other long term (current) drug therapy: Secondary | ICD-10-CM | POA: Diagnosis not present

## 2020-12-22 DIAGNOSIS — Z7982 Long term (current) use of aspirin: Secondary | ICD-10-CM | POA: Diagnosis not present

## 2020-12-22 DIAGNOSIS — I10 Essential (primary) hypertension: Secondary | ICD-10-CM | POA: Diagnosis not present

## 2020-12-22 DIAGNOSIS — Z96653 Presence of artificial knee joint, bilateral: Secondary | ICD-10-CM | POA: Diagnosis not present

## 2020-12-22 DIAGNOSIS — E871 Hypo-osmolality and hyponatremia: Secondary | ICD-10-CM | POA: Diagnosis not present

## 2020-12-22 DIAGNOSIS — Z20822 Contact with and (suspected) exposure to covid-19: Secondary | ICD-10-CM | POA: Diagnosis not present

## 2020-12-22 DIAGNOSIS — Z87891 Personal history of nicotine dependence: Secondary | ICD-10-CM | POA: Diagnosis not present

## 2020-12-22 DIAGNOSIS — M25562 Pain in left knee: Secondary | ICD-10-CM | POA: Diagnosis present

## 2020-12-22 LAB — OSMOLALITY: Osmolality: 269 mOsm/kg — ABNORMAL LOW (ref 275–295)

## 2020-12-22 LAB — URINALYSIS, ROUTINE W REFLEX MICROSCOPIC
Bilirubin Urine: NEGATIVE
Glucose, UA: NEGATIVE mg/dL
Hgb urine dipstick: NEGATIVE
Ketones, ur: NEGATIVE mg/dL
Leukocytes,Ua: NEGATIVE
Nitrite: NEGATIVE
Protein, ur: NEGATIVE mg/dL
Specific Gravity, Urine: 1.011 (ref 1.005–1.030)
pH: 5.5 (ref 5.0–8.0)

## 2020-12-22 LAB — BASIC METABOLIC PANEL
Anion gap: 9 (ref 5–15)
BUN: 15 mg/dL (ref 8–23)
CO2: 24 mmol/L (ref 22–32)
Calcium: 8.8 mg/dL — ABNORMAL LOW (ref 8.9–10.3)
Chloride: 94 mmol/L — ABNORMAL LOW (ref 98–111)
Creatinine, Ser: 0.64 mg/dL (ref 0.44–1.00)
GFR, Estimated: >60 mL/min (ref >60)
Glucose, Bld: 127 mg/dL — ABNORMAL HIGH (ref 70–99)
Potassium: 4.2 mmol/L (ref 3.5–5.1)
Sodium: 127 mmol/L — ABNORMAL LOW (ref 135–145)

## 2020-12-22 LAB — NA AND K (SODIUM & POTASSIUM), RAND UR
Potassium Urine: 40 mmol/L
Sodium, Ur: 91 mmol/L

## 2020-12-22 LAB — LACTIC ACID, PLASMA: Lactic Acid, Venous: 0.7 mmol/L (ref 0.5–1.9)

## 2020-12-22 LAB — TSH: TSH: 1.488 u[IU]/mL (ref 0.350–4.500)

## 2020-12-22 MED ORDER — ACETAMINOPHEN 500 MG PO TABS
1000.0000 mg | ORAL_TABLET | Freq: Four times a day (QID) | ORAL | Status: DC | PRN
  Filled 2020-12-22: qty 2

## 2020-12-22 MED ORDER — ENOXAPARIN SODIUM 40 MG/0.4ML IJ SOSY
40.0000 mg | PREFILLED_SYRINGE | INTRAMUSCULAR | Status: DC
  Administered 2020-12-22: 40 mg via SUBCUTANEOUS
  Filled 2020-12-22: qty 0.4

## 2020-12-22 MED ORDER — HYDROCODONE-ACETAMINOPHEN 5-325 MG PO TABS: 1.0000 | ORAL_TABLET | Freq: Four times a day (QID) | ORAL | Status: DC | PRN

## 2020-12-22 MED ORDER — METHOCARBAMOL 500 MG PO TABS: 500.0000 mg | ORAL_TABLET | Freq: Four times a day (QID) | ORAL | Status: DC | PRN

## 2020-12-22 MED ORDER — BRIMONIDINE TARTRATE-TIMOLOL 0.2-0.5 % OP SOLN
1.0000 [drp] | Freq: Two times a day (BID) | OPHTHALMIC | Status: DC
  Administered 2020-12-22: 1 [drp] via OPHTHALMIC

## 2020-12-22 MED ORDER — AMLODIPINE BESYLATE 5 MG PO TABS
5.0000 mg | ORAL_TABLET | Freq: Every day | ORAL | Status: DC
  Administered 2020-12-23: 5 mg via ORAL
  Filled 2020-12-22: qty 1

## 2020-12-22 MED ORDER — SENNOSIDES-DOCUSATE SODIUM 8.6-50 MG PO TABS: 1.0000 | ORAL_TABLET | Freq: Every evening | ORAL | Status: DC | PRN

## 2020-12-22 MED ORDER — TRAMADOL HCL 50 MG PO TABS: 50.0000 mg | ORAL_TABLET | Freq: Four times a day (QID) | ORAL | Status: DC | PRN

## 2020-12-22 MED ORDER — ONDANSETRON HCL 4 MG PO TABS: 4.0000 mg | ORAL_TABLET | Freq: Four times a day (QID) | ORAL | Status: DC | PRN

## 2020-12-22 MED ORDER — ACETAMINOPHEN 650 MG RE SUPP: 650.0000 mg | Freq: Four times a day (QID) | RECTAL | Status: DC | PRN

## 2020-12-22 MED ORDER — LATANOPROST 0.005 % OP SOLN
1.0000 [drp] | Freq: Every day | OPHTHALMIC | Status: DC
  Administered 2020-12-22: 1 [drp] via OPHTHALMIC
  Filled 2020-12-22: qty 2.5

## 2020-12-22 MED ORDER — ASPIRIN EC 325 MG PO TBEC
325.0000 mg | DELAYED_RELEASE_TABLET | Freq: Two times a day (BID) | ORAL | Status: DC
  Administered 2020-12-23: 325 mg via ORAL
  Filled 2020-12-22: qty 1

## 2020-12-22 MED ORDER — ONDANSETRON HCL 4 MG/2ML IJ SOLN: 4.0000 mg | Freq: Four times a day (QID) | INTRAMUSCULAR | Status: DC | PRN

## 2020-12-22 MED ORDER — DORZOLAMIDE HCL 2 % OP SOLN
1.0000 [drp] | Freq: Two times a day (BID) | OPHTHALMIC | Status: DC
  Administered 2020-12-22 – 2020-12-23 (×2): 1 [drp] via OPHTHALMIC
  Filled 2020-12-22: qty 10

## 2020-12-22 MED ORDER — SODIUM CHLORIDE 0.9 % IV SOLN: INTRAVENOUS | Status: AC

## 2020-12-22 NOTE — H&P (Signed)
History and Physical    Amy Carroll:[id number] DOB: 1934/08/22 DOA: 12/21/2020  PCP: Merrilee Seashore, MD  Patient coming from: [facility]  I have personally briefly reviewed patient's old medical records in [place]  Chief Complaint: Left leg pain  HPI: Amy Carroll is a 86 y.o. female with medical history significant for hypertension, osteoarthritis s/p left TKA 12/12/2020 who presented to the ED on 10/26 for evaluation of left leg pain and swelling.  Patient states that she has been having increased swelling to her left knee with pain making it difficult for her to ambulate.  She says she has not really been mobile for the last 3 days.  She also states that she has had little appetite and not eating/drinking as well as she should be.  She reports decreased urine output due to not staying hydrated.  She has not had any dysuria.  She denies any nausea, vomiting, diarrhea, chest pain, dyspnea, abdominal pain.  [place] ED Course:  Initial vitals showed BP 130 10/61, pulse 102, RR 16, temp 98.0 F, SPO2 100% on room air.  Labs showed sodium 125 (previously 135 on 12/14/2020), potassium 4.6, chloride 94, bicarb 24, BUN 13, creatinine 0.85, serum glucose 119, LFTs within normal limits, WBC 11.6, hemoglobin 10.4, platelets 349,000, magnesium 1.8, phosphorus 2.2, TSH 1.488, lactic acid 0.7 serum osmolality 269.  Urinalysis negative for UTI.  Urine sodium 91, urine potassium 40.  Left knee x-ray showed interval postoperative changes of left TKA.  Hardware appears intact without evidence of complication.  Moderate suprapatellar left knee joint effusion, soft tissue swelling around the knee noted.  LLE venous ultrasound negative for evidence of DVT.  The hospitalist service was consulted to admit for further evaluation management of hyponatremia.  Repeat BMP afternoon of 10/27 showed sodium 127.  Review of Systems: All systems reviewed and are  negative except as documented in history of present illness above.   Past Medical History:  Diagnosis Date   Anemia    Arthritis    Gait abnormality 06/20/2020   Hypertension     Past Surgical History:  Procedure Laterality Date   ABDOMINAL HYSTERECTOMY     COLONOSCOPY     HEMANGIOMA EXCISION     JOINT REPLACEMENT     TONSILLECTOMY     TOTAL HIP ARTHROPLASTY Right 02/18/2019   Procedure: TOTAL HIP ARTHROPLASTY ANTERIOR APPROACH;  Surgeon: Gaynelle Arabian, MD;  Location: [facility];  Service: Orthopedics;  Laterality: Right;  1107min   TOTAL KNEE ARTHROPLASTY Right 05/27/2012   Procedure: TOTAL KNEE ARTHROPLASTY;  Surgeon: Meredith Pel, MD;  Location: [facility];  Service: Orthopedics;  Laterality: Right;  Right Total Knee Arthroplasty   TOTAL KNEE ARTHROPLASTY Left 12/12/2020   Procedure: TOTAL KNEE ARTHROPLASTY;  Surgeon: Gaynelle Arabian, MD;  Location: [facility];  Service: Orthopedics;  Laterality: Left;   TOTAL KNEE REVISION Right 08/06/2018   Procedure: TOTAL KNEE REVISION;  Surgeon: Gaynelle Arabian, MD;  Location: [facility];  Service: Orthopedics;  Laterality: Right;   TUBAL LIGATION      Social History:  reports that she quit smoking about 23 years ago. Her smoking use included cigarettes. She has never used smokeless tobacco. She reports current alcohol use of about 1.0 standard drink per week. She reports that she does not use drugs.  No Known Allergies  Family History  Problem Relation Age of Onset   Cancer Mother    Heart disease Father    Cancer Brother  Asthma Brother      Prior to Admission medications   Medication Sig Start Date End Date Taking? Authorizing Provider  acetaminophen (TYLENOL) 500 MG tablet Take 500 mg by mouth every 6 (six) hours as needed for moderate pain.    [provider]  amLODipine (NORVASC) 5 MG tablet Take 5 mg by mouth daily.  02/06/16   [provider]  aspirin 325 MG EC tablet Take 1 tablet (325 mg total) by mouth 2 (two)  times daily for 19 days. Then take one 81 mg aspirin once a day for three weeks. Then discontinue aspirin. 12/14/20 01/02/21  Edmisten, Kristie L, PA  Camphor-Menthol-Methyl Sal (SALONPAS) 3.03-03-08 % PTCH Place 1 patch onto the skin daily as needed (knee pain).    [provider]  cholecalciferol (VITAMIN D3) 25 MCG (1000 UT) tablet Take 1,000 Units by mouth daily.    [provider]  COMBIGAN 0.2-0.5 % ophthalmic solution Place 1 drop into the right eye in the morning and at bedtime. 01/24/18   [provider]  Cyanocobalamin (B-12) 2500 MCG TABS Take 2,500 mcg by mouth daily.    [provider]  dorzolamide (TRUSOPT) 2 % ophthalmic solution Place 1 drop into the right eye 2 (two) times daily.  01/03/18   [provider]  ferrous sulfate 325 (65 FE) MG tablet Take 325 mg by mouth daily with breakfast.    [provider]  KLOR-CON M20 20 MEQ tablet TAKE 2 TABLETS BY MOUTH DAILY 12/07/20   Brunetta Genera, MD  latanoprost (XALATAN) 0.005 % ophthalmic solution Place 1 drop into both eyes at bedtime.  01/24/18   [provider]  methocarbamol (ROBAXIN) 500 MG tablet Take 1 tablet (500 mg total) by mouth every 6 (six) hours as needed for muscle spasms. 12/14/20   Edmisten, Ok Anis, PA  Multiple Vitamins-Minerals (PRESERVISION AREDS 2 PO) Take 1 capsule by mouth in the morning and at bedtime.    [provider]  olmesartan-hydrochlorothiazide (BENICAR HCT) 40-25 MG tablet Take 1 tablet by mouth daily.    [provider]  traMADol (ULTRAM) 50 MG tablet Take 1-2 tablets (50-100 mg total) by mouth every 6 (six) hours as needed for moderate pain. 12/14/20   Derl Barrow, PA    Physical Exam: Vitals:   12/22/20 1600 12/22/20 1630 12/22/20 1634 12/22/20 1717  BP: 106/64 136/65  135/71  Pulse:  96  (!) 104  Resp:   16 20  Temp:    98.4 F (36.9 C)  TempSrc:    Oral  SpO2:  99%  99%  Weight:      Height:        Constitutional: Resting supine in bed, NAD, calm, comfortable Eyes: PERRL, lids and conjunctivae normal ENMT: Mucous membranes are dry. Posterior pharynx clear of any exudate or lesions.Normal dentition.  Neck: normal, supple, no masses. Respiratory: clear to auscultation bilaterally, no wheezing, no crackles. Normal respiratory effort. No accessory muscle use.  Cardiovascular: Regular rate and rhythm, no murmurs / rubs / gallops.  Swelling over her left knee surgical site otherwise no other lower extremity edema. 2+ pedal pulses. Abdomen: no tenderness, no masses palpated. No hepatosplenomegaly. Bowel sounds positive.  Musculoskeletal: no clubbing / cyanosis.  Swelling over left knee/surgical site.  Good ROM, no contractures. Normal muscle tone.  Skin: Swelling over left knee, surgical site otherwise appears clean, dry, intact with dressing in place. Neurologic: CN 2-12 grossly intact. Sensation intact. Strength 5/5 in all 4.  Psychiatric: Normal judgment and insight. Alert and oriented x 3. Normal mood.   Labs on Admission: I have personally reviewed following labs and imaging studies  CBC: Recent Labs  Lab 12/19/20 0000 12/21/20 1721  WBC 10.4 11.6*  NEUTROABS  --  9.2*  HGB 9.2* 10.4*  HCT 26* 31.9*  MCV  --  94.4  PLT 268 782   Basic Metabolic Panel: Recent Labs  Lab 12/21/20 1721 12/22/20 1218  NA 125* 127*  K 4.6 4.2  CL 94* 94*  CO2 24 24  GLUCOSE 119* 127*  BUN 13 15  CREATININE 0.85 0.64  CALCIUM 8.5* 8.8*  MG 1.8  --   PHOS 2.2*  --    GFR: Estimated Creatinine Clearance: 43.3 mL/min (by C-G formula based on SCr of 0.64 mg/dL). Liver Function Tests: Recent Labs  Lab 12/21/20 1721  AST 21  ALT 27  ALKPHOS 122  BILITOT 0.5  PROT 6.9  ALBUMIN 3.4*   No results for input(s): LIPASE, AMYLASE in the last 168 hours. No results for input(s): AMMONIA in the last 168 hours. Coagulation Profile: No results for input(s): INR, PROTIME in the last 168  hours. Cardiac Enzymes: No results for input(s): CKTOTAL, CKMB, CKMBINDEX, TROPONINI in the last 168 hours. BNP (last 3 results) No results for input(s): PROBNP in the last 8760 hours. HbA1C: No results for input(s): HGBA1C in the last 72 hours. CBG: No results for input(s): GLUCAP in the last 168 hours. Lipid Profile: No results for input(s): CHOL, HDL, LDLCALC, TRIG, CHOLHDL, LDLDIRECT in the last 72 hours. Thyroid Function Tests: Recent Labs    12/21/20 2050  TSH 1.488   Anemia Panel: No results for input(s): VITAMINB12, FOLATE, FERRITIN, TIBC, IRON, RETICCTPCT in the last 72 hours. Urine analysis:    Component Value Date/Time   COLORURINE YELLOW 12/22/2020 1149   APPEARANCEUR CLEAR 12/22/2020 1149   LABSPEC 1.011 12/22/2020 1149   PHURINE 5.5 12/22/2020 1149   GLUCOSEU NEGATIVE 12/22/2020 1149   HGBUR NEGATIVE 12/22/2020 [place] 12/22/2020 1149   KETONESUR NEGATIVE 12/22/2020 1149   PROTEINUR NEGATIVE 12/22/2020 1149   UROBILINOGEN 0.2 05/20/2012 1607   NITRITE NEGATIVE 12/22/2020 [place] 12/22/2020 1149    Radiological Exams on Admission: DG Knee 2 Views Left  Result Date: 12/21/2020 CLINICAL DATA:  Knee replacement on Monday presenting with significant pain and swelling to the calf with pain radiating to the upper thigh and down to the ankle. EXAM: LEFT KNEE - 1-2 VIEW COMPARISON:  Left knee radiograph 10/21/2017 FINDINGS: Diffuse osteopenia. Interval postoperative changes of left total knee arthroplasty with cemented femoral and tibial components. Hardware is intact and in appropriate position without evidence of complication. Moderate suprapatellar knee joint effusion. Diffuse soft tissue swelling about the knee. Benign-appearing oblong lucent lesion in the distal femoral shaft with narrow transition zone and sharp margins, possibly surgery related. IMPRESSION: Interval postoperative changes of left total knee arthroplasty.  Hardware is intact without evidence of complication. Moderate suprapatellar left knee joint effusion. Soft tissue swelling around the knee. Electronically Signed   By: Ileana Roup M.D.   On: 12/21/2020 17:47   US Venous Img Lower Unilateral Left  Result Date: 12/21/2020 CLINICAL DATA:  Left total knee arthroplasty with knee and calf edema. EXAM: LEFT LOWER EXTREMITY VENOUS DOPPLER ULTRASOUND TECHNIQUE: Gray-scale sonography with compression, as well as color and duplex ultrasound, were performed to evaluate the deep venous system(s) from the level of the common femoral vein through  the popliteal and proximal calf veins. COMPARISON:  None. FINDINGS: VENOUS Normal compressibility of the common femoral, superficial femoral, and popliteal veins, as well as the visualized calf veins. Visualized portions of profunda femoral vein and great saphenous vein unremarkable. No filling defects to suggest DVT on grayscale or color Doppler imaging. Doppler waveforms show normal direction of venous flow, normal respiratory plasticity and response to augmentation. Limited views of the contralateral common femoral vein are unremarkable. OTHER None. Limitations: none IMPRESSION: Negative. Electronically Signed   By: Virgina Norfolk M.D.   On: 12/21/2020 19:46    EKG: Not performed.  Assessment/Plan Principal Problem:   Hyponatremia Active Problems:   Essential hypertension   TILDA SAMUDIO is a 86 y.o. female with medical history significant for hypertension, osteoarthritis s/p left TKA 12/12/2020 who is admitted for hyponatremia.  Hyponatremia: Likely hypotonic hyponatremia secondary to dehydration from poor oral intake and thiazide use. -Start gentle IV fluid with NS@50  mL/hour overnight -Hold thiazide -Repeat labs in a.m.  S/p left TKA 12/12/2020: Left knee x-ray shows interval postoperative changes of left TKA.  Hardware is intact without evidence of complication.  Moderate suprapatellar left knee  joint effusion noted with soft tissue swelling around the knee.  LLE venous ultrasound is negative for evidence of DVT. -Continue VTE prophylaxis -PT eval, fall precautions -Continue Tylenol and tramadol as needed for pain  Hypertension: Continue amlodipine.  Holding HCTZ.  DVT prophylaxis: Lovenox Code Status: Full code, confirmed on admission Family Communication: Discussed with patient, she has discussed with family Disposition Plan: From home and likely discharged home pending clinical progress Consults called: None Level of care: Med-Surg Admission status:  Status is: Observation  The patient remains OBS appropriate and will d/c before 2 midnights.   Zada Finders MD [facility]  If 7PM-7AM, please contact night-coverage [email or web address]  12/22/2020, 7:34 PM

## 2020-12-22 NOTE — ED Notes (Signed)
Report called to care link 

## 2020-12-22 NOTE — ED Notes (Signed)
Family updated as to patient's status. Pt will be admitted at [facility]

## 2020-12-22 NOTE — ED Notes (Signed)
Pt alert to self, time location and stastes that she feels ok at this time. Pt is calm and follows commands well

## 2020-12-23 DIAGNOSIS — E871 Hypo-osmolality and hyponatremia: Secondary | ICD-10-CM | POA: Diagnosis not present

## 2020-12-23 LAB — BASIC METABOLIC PANEL
Anion gap: 11 (ref 5–15)
BUN: 14 mg/dL (ref 8–23)
CO2: 23 mmol/L (ref 22–32)
Calcium: 8.6 mg/dL — ABNORMAL LOW (ref 8.9–10.3)
Chloride: 95 mmol/L — ABNORMAL LOW (ref 98–111)
Creatinine, Ser: 0.75 mg/dL (ref 0.44–1.00)
GFR, Estimated: >60 mL/min (ref >60)
Glucose, Bld: 108 mg/dL — ABNORMAL HIGH (ref 70–99)
Potassium: 3.7 mmol/L (ref 3.5–5.1)
Sodium: 129 mmol/L — ABNORMAL LOW (ref 135–145)

## 2020-12-23 MED ORDER — OLMESARTAN MEDOXOMIL 40 MG PO TABS: 40.0000 mg | ORAL_TABLET | Freq: Every day | ORAL | Status: DC

## 2020-12-23 MED ORDER — TRAMADOL HCL 50 MG PO TABS: 50.0000 mg | ORAL_TABLET | Freq: Four times a day (QID) | ORAL | 0 refills | Status: DC | PRN

## 2020-12-23 MED ORDER — BRIMONIDINE TARTRATE 0.2 % OP SOLN
1.0000 [drp] | Freq: Two times a day (BID) | OPHTHALMIC | Status: DC
  Administered 2020-12-23: 1 [drp] via OPHTHALMIC
  Filled 2020-12-23: qty 5

## 2020-12-23 MED ORDER — TIMOLOL MALEATE 0.5 % OP SOLN
1.0000 [drp] | Freq: Two times a day (BID) | OPHTHALMIC | Status: DC
  Administered 2020-12-23: 1 [drp] via OPHTHALMIC
  Filled 2020-12-23: qty 5

## 2020-12-23 NOTE — Progress Notes (Signed)
Called [facility] and spoke with Bedelia Person. Report provided and patient family notified. IV removed, Script given, personal belongings given to spouse, and patient will be transported back to [facility] by spouse.

## 2020-12-23 NOTE — Discharge Summary (Signed)
Physician Discharge Summary  KRISHA Carroll EXN:[id number] DOB: 1934/07/10 DOA: 12/21/2020  PCP: Merrilee Seashore, MD  Admit date: 12/21/2020 Discharge date: 12/23/2020  Admitted From: SNF Disposition: SNF  Recommendations for Outpatient Follow-up:  Follow up with PCP in 1 week Please obtain BMP in 3-5 days Please follow up on the following pending results: None   Discharge Condition: Stable CODE STATUS: Full code Diet recommendation: Heart healthy   Brief/Interim Summary:  Admission HPI written by Zada Finders, MD  HPI: Amy Carroll is a 86 y.o. female with medical history significant for hypertension, osteoarthritis s/p left TKA 12/12/2020 who presented to the ED on 10/26 for evaluation of left leg pain and swelling.  Patient states that she has been having increased swelling to her left knee with pain making it difficult for her to ambulate.  She says she has not really been mobile for the last 3 days.  She also states that she has had little appetite and not eating/drinking as well as she should be.  She reports decreased urine output due to not staying hydrated.  She has not had any dysuria.  She denies any nausea, vomiting, diarrhea, chest pain, dyspnea, abdominal pain.   Hospital course:  Hyponatremia Possibly multifactorial from dehydration and hydrochlorothiazide use. Patient was started on IV fluids and hydrochlorothiazide was held on admission. Sodium improved from 125 > 127 > 129 prior to discharge. Discontinued hydrochlorothiazide on discharge.  History of left TKA Patient is currently at SNF. Left knee x-ray significant for postoperative changes with moderate suprapatellar left knee joint effusion.  Primary hypertension Continue amlodipine. Discontinued hydrochlorothiazide and continued olmesartan.   Discharge Diagnoses:  Principal Problem:   Hyponatremia Active Problems:   Essential hypertension   Discharge Instructions   Allergies  as of 12/23/2020   No Known Allergies      Medication List     STOP taking these medications    olmesartan-hydrochlorothiazide 40-25 MG tablet Commonly known as: BENICAR HCT       TAKE these medications    acetaminophen 500 MG tablet Commonly known as: TYLENOL Take 500 mg by mouth every 6 (six) hours as needed for moderate pain.   amLODipine 5 MG tablet Commonly known as: NORVASC Take 5 mg by mouth daily.   aspirin 325 MG EC tablet Take 1 tablet (325 mg total) by mouth 2 (two) times daily for 19 days. Then take one 81 mg aspirin once a day for three weeks. Then discontinue aspirin. What changed: additional instructions   B-12 2500 MCG Tabs Take 2,500 mcg by mouth daily.   cholecalciferol 25 MCG (1000 UNIT) tablet Commonly known as: VITAMIN D3 Take 1,000 Units by mouth daily.   Combigan 0.2-0.5 % ophthalmic solution Generic drug: brimonidine-timolol Place 1 drop into the right eye in the morning and at bedtime.   dorzolamide 2 % ophthalmic solution Commonly known as: TRUSOPT Place 1 drop into the right eye 2 (two) times daily.   ferrous sulfate 325 (65 FE) MG tablet Take 325 mg by mouth daily with breakfast.   Klor-Con M20 20 MEQ tablet Generic drug: potassium chloride SA TAKE 2 TABLETS BY MOUTH DAILY   latanoprost 0.005 % ophthalmic solution Commonly known as: XALATAN Place 1 drop into both eyes at bedtime.   methocarbamol 500 MG tablet Commonly known as: ROBAXIN Take 1 tablet (500 mg total) by mouth every 6 (six) hours as needed for muscle spasms.   olmesartan 40 MG tablet Commonly known as: BENICAR Take 1  tablet (40 mg total) by mouth daily.   omeprazole 20 MG capsule Commonly known as: PRILOSEC Take 20 mg by mouth daily.   PRESERVISION AREDS 2 PO Take 1 capsule by mouth in the morning and at bedtime.   Salonpas 3.03-03-08 % Ptch Generic drug: Camphor-Menthol-Methyl Sal Place 1 patch onto the skin daily as needed (knee pain).   traMADol 50 MG  tablet Commonly known as: ULTRAM Take 1 tablet (50 mg total) by mouth every 6 (six) hours as needed for moderate pain.        No Known Allergies  Consultations: None   Procedures/Studies: DG Knee 2 Views Left  Result Date: 12/21/2020 CLINICAL DATA:  Knee replacement on Monday presenting with significant pain and swelling to the calf with pain radiating to the upper thigh and down to the ankle. EXAM: LEFT KNEE - 1-2 VIEW COMPARISON:  Left knee radiograph 10/21/2017 FINDINGS: Diffuse osteopenia. Interval postoperative changes of left total knee arthroplasty with cemented femoral and tibial components. Hardware is intact and in appropriate position without evidence of complication. Moderate suprapatellar knee joint effusion. Diffuse soft tissue swelling about the knee. Benign-appearing oblong lucent lesion in the distal femoral shaft with narrow transition zone and sharp margins, possibly surgery related. IMPRESSION: Interval postoperative changes of left total knee arthroplasty. Hardware is intact without evidence of complication. Moderate suprapatellar left knee joint effusion. Soft tissue swelling around the knee. Electronically Signed   By: Ileana Roup M.D.   On: 12/21/2020 17:47   US Venous Img Lower Unilateral Left  Result Date: 12/21/2020 CLINICAL DATA:  Left total knee arthroplasty with knee and calf edema. EXAM: LEFT LOWER EXTREMITY VENOUS DOPPLER ULTRASOUND TECHNIQUE: Gray-scale sonography with compression, as well as color and duplex ultrasound, were performed to evaluate the deep venous system(s) from the level of the common femoral vein through the popliteal and proximal calf veins. COMPARISON:  None. FINDINGS: VENOUS Normal compressibility of the common femoral, superficial femoral, and popliteal veins, as well as the visualized calf veins. Visualized portions of profunda femoral vein and great saphenous vein unremarkable. No filling defects to suggest DVT on grayscale or color  Doppler imaging. Doppler waveforms show normal direction of venous flow, normal respiratory plasticity and response to augmentation. Limited views of the contralateral common femoral vein are unremarkable. OTHER None. Limitations: none IMPRESSION: Negative. Electronically Signed   By: Virgina Norfolk M.D.   On: 12/21/2020 19:46      Subjective: Some mild confusion but that is improving.  Discharge Exam: Vitals:   12/23/20 0115 12/23/20 0516  BP: (!) 105/40 118/65  Pulse: 79 85  Resp: 20 18  Temp: 98.4 F (36.9 C) 98.2 F (36.8 C)  SpO2: 98% 99%   Vitals:   12/22/20 1634 12/22/20 1717 12/23/20 0115 12/23/20 0516  BP:  135/71 (!) 105/40 118/65  Pulse:  (!) 104 79 85  Resp: 16 20 20 18   Temp:  98.4 F (36.9 C) 98.4 F (36.9 C) 98.2 F (36.8 C)  TempSrc:  Oral Oral Oral  SpO2:  99% 98% 99%  Weight:      Height:        General exam: Appears calm and comfortable Respiratory system: Clear to auscultation. Respiratory effort normal. Cardiovascular system: S1 & S2 heard, RRR. 2/6 systolic murmur Gastrointestinal system: Abdomen is nondistended, soft and nontender. No organomegaly or masses felt. Normal bowel sounds heard. Central nervous system: Alert and oriented. No focal neurological deficits. Musculoskeletal: Left knee incision bandage intact and with no surrounding erythema/ecchymosis  Skin: No cyanosis. No rashes Psychiatry: Judgement and insight appear normal. Mood & affect appropriate.     The results of significant diagnostics from this hospitalization (including imaging, microbiology, ancillary and laboratory) are listed below for reference.     Microbiology: Recent Results (from the past 240 hour(s))  Resp Panel by RT-PCR (Flu A&B, Covid) Nasopharyngeal Swab     Status: None   Collection Time: 12/21/20  9:08 PM   Specimen: Nasopharyngeal Swab; Nasopharyngeal(NP) swabs in vial transport medium  Result Value Ref Range Status   SARS Coronavirus 2 by RT PCR  NEGATIVE NEGATIVE Final    Comment: (NOTE) SARS-CoV-2 target nucleic acids are NOT DETECTED.  The SARS-CoV-2 RNA is generally detectable in upper respiratory specimens during the acute phase of infection. The lowest concentration of SARS-CoV-2 viral copies this assay can detect is 138 copies/mL. A negative result does not preclude SARS-Cov-2 infection and should not be used as the sole basis for treatment or other patient management decisions. A negative result may occur with  improper specimen collection/handling, submission of specimen other than nasopharyngeal swab, presence of viral mutation(s) within the areas targeted by this assay, and inadequate number of viral copies(<138 copies/mL). A negative result must be combined with clinical observations, patient history, and epidemiological information. The expected result is Negative.  Fact Sheet for Patients:  EntrepreneurPulse.com.au  Fact Sheet for Healthcare Providers:  IncredibleEmployment.be  This test is no t yet approved or cleared by the [place] FDA and  has been authorized for detection and/or diagnosis of SARS-CoV-2 by FDA under an Emergency Use Authorization (EUA). This EUA will remain  in effect (meaning this test can be used) for the duration of the COVID-19 declaration under Section 564(b)(1) of the Act, 21 U.S.C.section 360bbb-3(b)(1), unless the authorization is terminated  or revoked sooner.       Influenza A by PCR NEGATIVE NEGATIVE Final   Influenza B by PCR NEGATIVE NEGATIVE Final    Comment: (NOTE) The Xpert Xpress SARS-CoV-2/FLU/RSV plus assay is intended as an aid in the diagnosis of influenza from Nasopharyngeal swab specimens and should not be used as a sole basis for treatment. Nasal washings and aspirates are unacceptable for Xpert Xpress SARS-CoV-2/FLU/RSV testing.  Fact Sheet for Patients: EntrepreneurPulse.com.au  Fact Sheet for  Healthcare Providers: IncredibleEmployment.be  This test is not yet approved or cleared by the [place] FDA and has been authorized for detection and/or diagnosis of SARS-CoV-2 by FDA under an Emergency Use Authorization (EUA). This EUA will remain in effect (meaning this test can be used) for the duration of the COVID-19 declaration under Section 564(b)(1) of the Act, 21 U.S.C. section 360bbb-3(b)(1), unless the authorization is terminated or revoked.  Performed at [facility], [address], [place], [place] [address]      Labs: BNP (last 3 results) No results for input(s): BNP in the last 8760 hours. Basic Metabolic Panel: Recent Labs  Lab 12/21/20 1721 12/22/20 1218 12/23/20 0433  NA 125* 127* 129*  K 4.6 4.2 3.7  CL 94* 94* 95*  CO2 24 24 23   GLUCOSE 119* 127* 108*  BUN 13 15 14   CREATININE 0.85 0.64 0.75  CALCIUM 8.5* 8.8* 8.6*  MG 1.8  --   --   PHOS 2.2*  --   --    Liver Function Tests: Recent Labs  Lab 12/21/20 1721  AST 21  ALT 27  ALKPHOS 122  BILITOT 0.5  PROT 6.9  ALBUMIN 3.4*   No results for  input(s): LIPASE, AMYLASE in the last 168 hours. No results for input(s): AMMONIA in the last 168 hours. CBC: Recent Labs  Lab 12/19/20 0000 12/21/20 1721  WBC 10.4 11.6*  NEUTROABS  --  9.2*  HGB 9.2* 10.4*  HCT 26* 31.9*  MCV  --  94.4  PLT 268 349   Cardiac Enzymes: No results for input(s): CKTOTAL, CKMB, CKMBINDEX, TROPONINI in the last 168 hours. BNP: Invalid input(s): POCBNP CBG: No results for input(s): GLUCAP in the last 168 hours. D-Dimer No results for input(s): DDIMER in the last 72 hours. Hgb A1c No results for input(s): HGBA1C in the last 72 hours. Lipid Profile No results for input(s): CHOL, HDL, LDLCALC, TRIG, CHOLHDL, LDLDIRECT in the last 72 hours. Thyroid function studies Recent Labs    12/21/20 2050  TSH 1.488   Anemia work up No results for input(s): VITAMINB12, FOLATE,  FERRITIN, TIBC, IRON, RETICCTPCT in the last 72 hours. Urinalysis    Component Value Date/Time   COLORURINE YELLOW 12/22/2020 1149   APPEARANCEUR CLEAR 12/22/2020 1149   LABSPEC 1.011 12/22/2020 1149   PHURINE 5.5 12/22/2020 1149   GLUCOSEU NEGATIVE 12/22/2020 1149   HGBUR NEGATIVE 12/22/2020 1149   BILIRUBINUR NEGATIVE 12/22/2020 1149   KETONESUR NEGATIVE 12/22/2020 1149   PROTEINUR NEGATIVE 12/22/2020 1149   UROBILINOGEN 0.2 05/20/2012 1607   NITRITE NEGATIVE 12/22/2020 [place] 12/22/2020 1149   Sepsis Labs Invalid input(s): PROCALCITONIN,  WBC,  LACTICIDVEN Microbiology Recent Results (from the past 240 hour(s))  Resp Panel by RT-PCR (Flu A&B, Covid) Nasopharyngeal Swab     Status: None   Collection Time: 12/21/20  9:08 PM   Specimen: Nasopharyngeal Swab; Nasopharyngeal(NP) swabs in vial transport medium  Result Value Ref Range Status   SARS Coronavirus 2 by RT PCR NEGATIVE NEGATIVE Final    Comment: (NOTE) SARS-CoV-2 target nucleic acids are NOT DETECTED.  The SARS-CoV-2 RNA is generally detectable in upper respiratory specimens during the acute phase of infection. The lowest concentration of SARS-CoV-2 viral copies this assay can detect is 138 copies/mL. A negative result does not preclude SARS-Cov-2 infection and should not be used as the sole basis for treatment or other patient management decisions. A negative result may occur with  improper specimen collection/handling, submission of specimen other than nasopharyngeal swab, presence of viral mutation(s) within the areas targeted by this assay, and inadequate number of viral copies(<138 copies/mL). A negative result must be combined with clinical observations, patient history, and epidemiological information. The expected result is Negative.  Fact Sheet for Patients:  EntrepreneurPulse.com.au  Fact Sheet for Healthcare Providers:   IncredibleEmployment.be  This test is no t yet approved or cleared by the [place] FDA and  has been authorized for detection and/or diagnosis of SARS-CoV-2 by FDA under an Emergency Use Authorization (EUA). This EUA will remain  in effect (meaning this test can be used) for the duration of the COVID-19 declaration under Section 564(b)(1) of the Act, 21 U.S.C.section 360bbb-3(b)(1), unless the authorization is terminated  or revoked sooner.       Influenza A by PCR NEGATIVE NEGATIVE Final   Influenza B by PCR NEGATIVE NEGATIVE Final    Comment: (NOTE) The Xpert Xpress SARS-CoV-2/FLU/RSV plus assay is intended as an aid in the diagnosis of influenza from Nasopharyngeal swab specimens and should not be used as a sole basis for treatment. Nasal washings and aspirates are unacceptable for Xpert Xpress SARS-CoV-2/FLU/RSV testing.  Fact Sheet for Patients: EntrepreneurPulse.com.au  Fact Sheet for Healthcare  Providers: IncredibleEmployment.be  This test is not yet approved or cleared by the [place] and has been authorized for detection and/or diagnosis of SARS-CoV-2 by FDA under an Emergency Use Authorization (EUA). This EUA will remain in effect (meaning this test can be used) for the duration of the COVID-19 declaration under Section 564(b)(1) of the Act, 21 U.S.C. section 360bbb-3(b)(1), unless the authorization is terminated or revoked.  Performed at [facility], [address] [place], [place] [address]     SIGNED:   Cordelia Poche, MD [facility] 12/23/2020, 11:56 AM

## 2020-12-23 NOTE — TOC Transition Note (Signed)
Transition of Care [facility]) - CM/SW Discharge Note   Patient Details  Name: Amy Carroll MRN: [id number] Date of Birth: 07/03/1934  [facility] of [facility] [place] CM/SW Contact:  Trish Mage, LCSW Phone Number: 12/23/2020, 11:49 AM   Clinical Narrative:   Patient who is a resident of [facility], currently at SNF, is medically stable and will return today.  Family informed. Facility notified.  COVID test not necessary. Husband will provide transportation home.  Nursing, please call report to [phone]. TOC sign off.    Final next level of care: Skilled Nursing Facility Barriers to Discharge: No Barriers Identified   Patient Goals and CMS Choice        Discharge Placement                       Discharge Plan and Services                                     Social Determinants of Health (SDOH) Interventions     Readmission Risk Interventions No flowsheet data found.

## 2020-12-23 NOTE — Evaluation (Signed)
Physical Therapy Evaluation Patient Details Name: Amy Carroll MRN: [id number] DOB: 1934/11/07 Today's Date: 12/23/2020  History of Present Illness  86 y.o. female with medical history significant for hypertension, Rt TKA and revision, Rt THA, recently s/p left TKA on 12/12/2020 who presented to the ED on 10/26 for evaluation of left leg pain and swelling and found to have hyponatremia  Clinical Impression  Pt admitted with above diagnosis. Pt currently with functional limitations due to the deficits listed below (see PT Problem List). Pt will benefit from skilled PT to increase their independence and safety with mobility to allow discharge to the venue listed below.  Pt presents with decreased cognition today (both RN and MD aware) and requiring increased time, cues, and assist.  Pt recently s/p Lt TKA and was admitted from SNF.  Recommend pt return to SNF level care for rehab upon d/c.      Recommendations for follow up therapy are one component of a multi-disciplinary discharge planning process, led by the attending physician.  Recommendations may be updated based on patient status, additional functional criteria and insurance authorization.  Follow Up Recommendations Skilled nursing-short term rehab (<3 hours/day)    Assistance Recommended at Discharge Frequent or constant Supervision/Assistance  Functional Status Assessment Patient has had a recent decline in their functional status and demonstrates the ability to make significant improvements in function in a reasonable and predictable amount of time.  Equipment Recommendations  None recommended by PT    Recommendations for Other Services       Precautions / Restrictions Precautions Precautions: Fall;Knee Restrictions LLE Weight Bearing: Weight bearing as tolerated      Mobility  Bed Mobility Overal bed mobility: Needs Assistance Bed Mobility: Supine to Sit     Supine to sit: Mod assist     General bed mobility  comments: required assist for trunk and Lt LE    Transfers Overall transfer level: Needs assistance Equipment used: Rolling walker (2 wheels) Transfers: Sit to/from [employer] Transfers Sit to Stand: Mod assist Stand pivot transfers: Mod assist         General transfer comment: max verbal and visual cues for technique, hand placement, COG, pt tends to not use UEs through RW to self assist and requiring increased assist for stability and weakness, stand pivot to [facility] and then 180* turn to recliner    Ambulation/Gait                Stairs            Wheelchair Mobility    Modified Rankin (Stroke Patients Only)       Balance Overall balance assessment: Needs assistance         Standing balance support: Bilateral upper extremity supported;During functional activity Standing balance-Leahy Scale: Zero                               Pertinent Vitals/Pain Pain Assessment: No/denies pain Pain Intervention(s): Repositioned;Monitored during session    Home Living Family/patient expects to be discharged to:: Skilled nursing facility [facility] SNF) Living Arrangements: Spouse/significant other Available Help at Discharge: Family Type of Home: House (House at [facility]) Home Access: Level entry       Home Layout: One level   Additional Comments: ILF prior to L TKA, discharged to rehab section    Prior Function Prior Level of Function : Needs assist       Physical Assist : Mobility (physical)  Mobility Comments: pt recently s/p L TKA and discharged to rehab section of facility       Hand Dominance   Dominant Hand: Right    Extremity/Trunk Assessment        Lower Extremity Assessment Lower Extremity Assessment: LLE deficits/detail LLE Deficits / Details: significant limited knee extension during mobility, maintains flexed knee posture, also knee edema present, pt unable to perform SLR, requiring assist for Lt LE        Communication   Communication: No difficulties  Cognition Arousal/Alertness: Awake/alert Behavior During Therapy: WFL for tasks assessed/performed Overall Cognitive Status: No family/caregiver present to determine baseline cognitive functioning                                 General Comments: pt not orientated to place or situation, also unable to recall she was at [facility] after being reorientated at least 4 times during session; requiring step by step cues for technique and safety, appears confused  ??underlying hx of dementia        General Comments      Exercises     Assessment/Plan    PT Assessment Patient needs continued PT services  PT Problem List Decreased range of motion;Decreased strength;Decreased balance;Decreased mobility;Decreased knowledge of precautions;Pain;Decreased knowledge of use of DME;Decreased cognition       PT Treatment Interventions Gait training;DME instruction;Stair training;Functional mobility training;Balance training;Therapeutic exercise;Therapeutic activities;Patient/family education;Cognitive remediation    PT Goals (Current goals can be found in the Care Plan section)  Acute Rehab PT Goals PT Goal Formulation: Patient unable to participate in goal setting Time For Goal Achievement: 01/06/21 Potential to Achieve Goals: Good    Frequency Min 3X/week   Barriers to discharge        Co-evaluation               AM-PAC PT "6 Clicks" Mobility  Outcome Measure Help needed turning from your back to your side while in a flat bed without using bedrails?: A Lot Help needed moving from lying on your back to sitting on the side of a flat bed without using bedrails?: A Lot Help needed moving to and from a bed to a chair (including a wheelchair)?: A Lot Help needed standing up from a chair using your arms (e.g., wheelchair or bedside chair)?: A Lot Help needed to walk in hospital room?: Total Help needed climbing 3-5 steps with a  railing? : Total 6 Click Score: 10    End of Session Equipment Utilized During Treatment: Gait belt Activity Tolerance: Patient tolerated treatment well Patient left: in chair;with call bell/phone within reach;with chair alarm set Nurse Communication: Mobility status PT Visit Diagnosis: Muscle weakness (generalized) (M62.81);Difficulty in walking, not elsewhere classified (R26.2)    Time: 1000-1023 PT Time Calculation (min) (ACUTE ONLY): 23 min   Charges:   PT Evaluation $PT Eval Low Complexity: 1 Low PT Treatments $Therapeutic Activity: 8-22 mins       Jannette Spanner PT, DPT Acute Rehabilitation Services Pager: [phone] Office: [phone]  Myrtis Hopping [place] 12/23/2020, 12:43 PM

## 2020-12-26 ENCOUNTER — Non-Acute Institutional Stay (SKILLED_NURSING_FACILITY): Payer: Medicare Other | Admitting: Internal Medicine

## 2020-12-26 ENCOUNTER — Encounter: Payer: Self-pay | Admitting: Internal Medicine

## 2020-12-26 DIAGNOSIS — M25562 Pain in left knee: Secondary | ICD-10-CM | POA: Diagnosis not present

## 2020-12-26 DIAGNOSIS — I10 Essential (primary) hypertension: Secondary | ICD-10-CM

## 2020-12-26 DIAGNOSIS — E538 Deficiency of other specified B group vitamins: Secondary | ICD-10-CM

## 2020-12-26 DIAGNOSIS — D508 Other iron deficiency anemias: Secondary | ICD-10-CM

## 2020-12-26 DIAGNOSIS — E871 Hypo-osmolality and hyponatremia: Secondary | ICD-10-CM

## 2020-12-26 DIAGNOSIS — R413 Other amnesia: Secondary | ICD-10-CM

## 2020-12-26 DIAGNOSIS — R41841 Cognitive communication deficit: Secondary | ICD-10-CM | POA: Diagnosis not present

## 2020-12-26 DIAGNOSIS — M62562 Muscle wasting and atrophy, not elsewhere classified, left lower leg: Secondary | ICD-10-CM | POA: Diagnosis not present

## 2020-12-26 DIAGNOSIS — Z96652 Presence of left artificial knee joint: Secondary | ICD-10-CM | POA: Diagnosis not present

## 2020-12-26 DIAGNOSIS — D500 Iron deficiency anemia secondary to blood loss (chronic): Secondary | ICD-10-CM | POA: Diagnosis not present

## 2020-12-26 DIAGNOSIS — R2689 Other abnormalities of gait and mobility: Secondary | ICD-10-CM | POA: Diagnosis not present

## 2020-12-26 DIAGNOSIS — R2681 Unsteadiness on feet: Secondary | ICD-10-CM | POA: Diagnosis not present

## 2020-12-26 LAB — BASIC METABOLIC PANEL
BUN: 12 (ref 4–21)
CO2: 24 — AB (ref 13–22)
Chloride: 94 — AB (ref 99–108)
Creatinine: 0.7 (ref 0.5–1.1)
Glucose: 84
Potassium: 5.4 — AB (ref 3.4–5.3)
Sodium: 130 — AB (ref 137–147)

## 2020-12-26 LAB — COMPREHENSIVE METABOLIC PANEL: Calcium: 9 (ref 8.7–10.7)

## 2020-12-26 NOTE — Progress Notes (Signed)
Provider:   Location:  [facility] Room Number: [id number] Place of Service:  SNF (31)  PCP: Merrilee Seashore, MD Patient Care Team: Merrilee Seashore, MD as PCP - General (Internal Medicine)  Extended Emergency Contact Information Primary Emergency Contact: Sullivant,[name] Address: [address]          [place], [place] [address] [place] Phone: [phone] Mobile Phone: [phone] Relation: Spouse  Code Status:  Goals of Care: Advanced Directive information Advanced Directives 12/26/2020  Does Patient Have a Medical Advance Directive? Yes  Type of Paramedic of [place];Living will  Does patient want to make changes to medical advance directive? No - Patient declined  Copy of [place] in Chart? No - copy requested  Would patient like information on creating a medical advance directive? -  Pre-existing out of facility DNR order (yellow form or pink MOST form) -      Chief Complaint  Patient presents with   New Admit To SNF    HPI: Patient is a 86 y.o. female seen today for admission to SNF for therapy  Admitted in the hospital from 10/17-10/19 for left  total knee arthroplasty Also admitted in the hospital from 10/26 through 10/28 for hyponatremia and left knee swelling  Patient has history of anemia multifactorial due to iron deficiency and B12 follows with hematology and gets iron infusions as needed Also has history of hypertension and arthritis  She was electively admitted in the hospital for left TKA. Was sent back to the hospital for left leg swelling with worsening pain But was found to be hyponatremic.  Thought to be secondary to hydrochlorothiazide and decreased appetite.  She was hydrated and HCTZ was discontinued. For her left knee swelling x-ray showed postop changes with moderate suprapatellar joint effusion.  Dopplers were negative for any DVT.  Patient is  back in SNF.  Continues to have poor appetite.  Labs from today are pending.  Also continues to have swelling in left leg.  Talk to the therapist and patient is also having some cognitive issues. Is struggling with some of the instructions for ambulation.  She is WBAT She lives with her husband in [address] apartment in [place].  Husband is the main caregiver Past Medical History:  Diagnosis Date   Anemia    Arthritis    Gait abnormality 06/20/2020   Hypertension    Past Surgical History:  Procedure Laterality Date   ABDOMINAL HYSTERECTOMY     COLONOSCOPY     HEMANGIOMA EXCISION     JOINT REPLACEMENT     TONSILLECTOMY     TOTAL HIP ARTHROPLASTY Right 02/18/2019   Procedure: TOTAL HIP ARTHROPLASTY ANTERIOR APPROACH;  Surgeon: Gaynelle Arabian, MD;  Location: [facility];  Service: Orthopedics;  Laterality: Right;  18min   TOTAL KNEE ARTHROPLASTY Right 05/27/2012   Procedure: TOTAL KNEE ARTHROPLASTY;  Surgeon: Meredith Pel, MD;  Location: [facility];  Service: Orthopedics;  Laterality: Right;  Right Total Knee Arthroplasty   TOTAL KNEE ARTHROPLASTY Left 12/12/2020   Procedure: TOTAL KNEE ARTHROPLASTY;  Surgeon: Gaynelle Arabian, MD;  Location: [facility];  Service: Orthopedics;  Laterality: Left;   TOTAL KNEE REVISION Right 08/06/2018   Procedure: TOTAL KNEE REVISION;  Surgeon: Gaynelle Arabian, MD;  Location: [facility];  Service: Orthopedics;  Laterality: Right;   TUBAL LIGATION      reports that she quit smoking about 23 years ago. Her smoking use included cigarettes. She has never used smokeless  tobacco. She reports current alcohol use of about 1.0 standard drink per week. She reports that she does not use drugs. Social History   Socioeconomic History   Marital status: Married    Spouse name: Shanon Brow   Number of children: Not on file   Years of education: Not on file   Highest education level: Not on file  Occupational History   Not on file  Tobacco Use   Smoking status: Former    Types: Cigarettes     Quit date: 1999    Years since quitting: 23.8   Smokeless tobacco: Never  Vaping Use   Vaping Use: Never used  Substance and Sexual Activity   Alcohol use: Yes    Alcohol/week: 1.0 standard drink    Types: 1 Glasses of wine per week    Comment: daily   Drug use: No   Sexual activity: Not on file  Other Topics Concern   Not on file  Social History Narrative   Lives with husband   Right Handed   Drinks caffeine rarely   Social Determinants of Health   Financial Resource Strain: Not on file  Food Insecurity: Not on file  Transportation Needs: Not on file  Physical Activity: Not on file  Stress: Not on file  Social Connections: Not on file  Intimate Partner Violence: Not on file    Functional Status Survey:    Family History  Problem Relation Age of Onset   Cancer Mother    Heart disease Father    Cancer Brother    Asthma Brother     Health Maintenance  Topic Date Due   TETANUS/TDAP  Never done   Pneumonia Vaccine 68+ Years old (1 - PCV) Never done   DEXA SCAN  Never done   COVID-19 Vaccine (3 - Booster) 08/01/2020   INFLUENZA VACCINE  Completed   Zoster Vaccines- Shingrix  Completed   HPV VACCINES  Aged Out    No Known Allergies  Outpatient Encounter Medications as of 12/26/2020  Medication Sig   acetaminophen (TYLENOL) 500 MG tablet Take 500 mg by mouth every 6 (six) hours as needed for moderate pain.   amLODipine (NORVASC) 5 MG tablet Take 5 mg by mouth daily.    aspirin 325 MG EC tablet Take 1 tablet (325 mg total) by mouth 2 (two) times daily for 19 days. Then take one 81 mg aspirin once a day for three weeks. Then discontinue aspirin.   [START ON 01/02/2021] aspirin EC 81 MG tablet Take 81 mg by mouth daily. Swallow whole.   bisacodyl (DULCOLAX) 10 MG suppository Place 10 mg rectally as needed for moderate constipation.   cholecalciferol (VITAMIN D3) 25 MCG (1000 UT) tablet Take 1,000 Units by mouth daily.   COMBIGAN 0.2-0.5 % ophthalmic solution Place  1 drop into the right eye in the morning and at bedtime.   Cyanocobalamin (B-12) 2500 MCG TABS Take 2,500 mcg by mouth daily.   dorzolamide (TRUSOPT) 2 % ophthalmic solution Place 1 drop into the right eye 2 (two) times daily.    ferrous sulfate 325 (65 FE) MG tablet Take 325 mg by mouth daily with breakfast.   KLOR-CON M20 20 MEQ tablet TAKE 2 TABLETS BY MOUTH DAILY   latanoprost (XALATAN) 0.005 % ophthalmic solution Place 1 drop into both eyes at bedtime.    methocarbamol (ROBAXIN) 500 MG tablet Take 1 tablet (500 mg total) by mouth every 6 (six) hours as needed for muscle spasms. (Patient taking differently: Take 250  mg by mouth every 6 (six) hours as needed for muscle spasms.)   Multiple Vitamins-Minerals (PRESERVISION AREDS 2 PO) Take 1 capsule by mouth in the morning and at bedtime.   olmesartan (BENICAR) 40 MG tablet Take 1 tablet (40 mg total) by mouth daily.   omeprazole (PRILOSEC) 20 MG capsule Take 20 mg by mouth daily.   traMADol (ULTRAM) 50 MG tablet Take 1 tablet (50 mg total) by mouth every 6 (six) hours as needed for moderate pain. (Patient taking differently: Take 25 mg by mouth every 6 (six) hours as needed for moderate pain.)   [DISCONTINUED] Camphor-Menthol-Methyl Sal (SALONPAS) 3.03-03-08 % PTCH Place 1 patch onto the skin daily as needed (knee pain).   No facility-administered encounter medications on file as of 12/26/2020.    Review of Systems  Constitutional:  Positive for activity change and appetite change.  HENT: Negative.    Respiratory: Negative.    Cardiovascular: Negative.   Gastrointestinal:  Positive for constipation.  Genitourinary: Negative.   Musculoskeletal:  Positive for arthralgias, gait problem and myalgias.  Skin: Negative.   Neurological:  Positive for weakness.  Psychiatric/Behavioral:  Positive for confusion.    Vitals:   12/26/20 0950  BP: 123/69  Pulse: 74  Resp: 16  Temp: 97.7 F (36.5 C)  SpO2: 98%  Weight: 127 lb 3.2 oz (57.7 kg)   Height: 5\' 3"  (1.6 m)   Body mass index is 22.53 kg/m. Physical Exam Constitutional:  Well-developed and well-nourished.  HENT:  Head: Normocephalic.  Mouth/Throat: Oropharynx is clear and moist.  Eyes: Pupils are equal, round, and reactive to light.  Neck: Neck supple.  Cardiovascular: Normal rate and normal heart sounds.  No murmur heard. Pulmonary/Chest: Effort normal and breath sounds normal. No respiratory distress. No wheezes. She has no rales.  Abdominal: Soft. Bowel sounds are normal. No distension. There is no tenderness. There is no rebound.  Musculoskeletal: Left Knee swollen with Mild Ankle swelling Lymphadenopathy: none Neurological: Alert Mild Weakness in Left Leg Skin: Skin is warm and dry.  Psychiatric: Normal mood and affect. Behavior is normal. Thought content normal.   Labs reviewed: Basic Metabolic Panel: Recent Labs    12/21/20 1721 12/22/20 1218 12/23/20 0433  NA 125* 127* 129*  K 4.6 4.2 3.7  CL 94* 94* 95*  CO2 24 24 23   GLUCOSE 119* 127* 108*  BUN 13 15 14   CREATININE 0.85 0.64 0.75  CALCIUM 8.5* 8.8* 8.6*  MG 1.8  --   --   PHOS 2.2*  --   --    Liver Function Tests: Recent Labs    11/23/20 1434 12/02/20 1443 12/21/20 1721  AST 11* 14* 21  ALT 10 12 27   ALKPHOS 81 66 122  BILITOT 0.4 0.3 0.5  PROT 7.2 7.1 6.9  ALBUMIN 3.2* 3.4* 3.4*   No results for input(s): LIPASE, AMYLASE in the last 8760 hours. No results for input(s): AMMONIA in the last 8760 hours. CBC: Recent Labs    11/23/20 1434 12/02/20 1443 12/13/20 0317 12/14/20 0325 12/19/20 0000 12/21/20 1721  WBC 6.3   < > 8.0 7.6 10.4 11.6*  NEUTROABS 3.9  --   --   --   --  9.2*  HGB 9.8*   < > 9.1* 9.4* 9.2* 10.4*  HCT 29.6*   < > 27.8* 29.1* 26* 31.9*  MCV 92.5   < > 96.9 96.4  --  94.4  PLT 347   < > 262 264 268 349   < > =  values in this interval not displayed.   Cardiac Enzymes: Recent Labs    04/27/20 1559  CKTOTAL 25*   BNP: Invalid input(s): POCBNP No  results found for: HGBA1C Lab Results  Component Value Date   TSH 1.488 12/21/2020   Lab Results  Component Value Date   VITAMINB12 254 04/27/2020   No results found for: FOLATE Lab Results  Component Value Date   IRON 30 (L) 11/23/2020   TIBC 233 (L) 11/23/2020   FERRITIN 173 11/23/2020    Imaging and Procedures obtained prior to SNF admission: DG Knee 2 Views Left  Result Date: 12/21/2020 CLINICAL DATA:  Knee replacement on Monday presenting with significant pain and swelling to the calf with pain radiating to the upper thigh and down to the ankle. EXAM: LEFT KNEE - 1-2 VIEW COMPARISON:  Left knee radiograph 10/21/2017 FINDINGS: Diffuse osteopenia. Interval postoperative changes of left total knee arthroplasty with cemented femoral and tibial components. Hardware is intact and in appropriate position without evidence of complication. Moderate suprapatellar knee joint effusion. Diffuse soft tissue swelling about the knee. Benign-appearing oblong lucent lesion in the distal femoral shaft with narrow transition zone and sharp margins, possibly surgery related. IMPRESSION: Interval postoperative changes of left total knee arthroplasty. Hardware is intact without evidence of complication. Moderate suprapatellar left knee joint effusion. Soft tissue swelling around the knee. Electronically Signed   By: Ileana Roup M.D.   On: 12/21/2020 17:47   US Venous Img Lower Unilateral Left  Result Date: 12/21/2020 CLINICAL DATA:  Left total knee arthroplasty with knee and calf edema. EXAM: LEFT LOWER EXTREMITY VENOUS DOPPLER ULTRASOUND TECHNIQUE: Gray-scale sonography with compression, as well as color and duplex ultrasound, were performed to evaluate the deep venous system(s) from the level of the common femoral vein through the popliteal and proximal calf veins. COMPARISON:  None. FINDINGS: VENOUS Normal compressibility of the common femoral, superficial femoral, and popliteal veins, as well as the  visualized calf veins. Visualized portions of profunda femoral vein and great saphenous vein unremarkable. No filling defects to suggest DVT on grayscale or color Doppler imaging. Doppler waveforms show normal direction of venous flow, normal respiratory plasticity and response to augmentation. Limited views of the contralateral common femoral vein are unremarkable. OTHER None. Limitations: none IMPRESSION: Negative. Electronically Signed   By: Virgina Norfolk M.D.   On: 12/21/2020 19:46    Assessment/Plan 1. Status post total left knee replacement WBAT Will reduce the dose of Robaxin and Tramadol due to her confusion and decrease Appetite On High doses of Aspirin for DVT prophylaxis Knee does have Effusion Post op Has appointment with Dr Maureen Ralphs Also Added Prilosec for GI protection  2. Hyponatremia Off HCTZ now BP stable Repeat Sodium today was 130 Will repeat Labs in 1 week Also has poor Appetite Will add Supplement   3. Other iron deficiency anemia On Iron Infusions per Hematology  4. Short-term memory loss MMSE 27/30 Recall 1/3 Seems to be having issue with her Therapy instructions  5. Essential hypertension On Olmesartan and Norvasc  6. B12 deficiency Continue Supplement     Family/ staff Communication:   Labs/tests ordered:BMP and CBC in 1 week

## 2020-12-27 ENCOUNTER — Non-Acute Institutional Stay (SKILLED_NURSING_FACILITY): Payer: Medicare Other | Admitting: Orthopedic Surgery

## 2020-12-27 ENCOUNTER — Encounter: Payer: Self-pay | Admitting: Orthopedic Surgery

## 2020-12-27 DIAGNOSIS — Z471 Aftercare following joint replacement surgery: Secondary | ICD-10-CM | POA: Diagnosis not present

## 2020-12-27 DIAGNOSIS — Z96652 Presence of left artificial knee joint: Secondary | ICD-10-CM

## 2020-12-27 DIAGNOSIS — R413 Other amnesia: Secondary | ICD-10-CM

## 2020-12-27 DIAGNOSIS — M25562 Pain in left knee: Secondary | ICD-10-CM | POA: Diagnosis not present

## 2020-12-27 DIAGNOSIS — R41840 Attention and concentration deficit: Secondary | ICD-10-CM | POA: Diagnosis not present

## 2020-12-27 DIAGNOSIS — R278 Other lack of coordination: Secondary | ICD-10-CM | POA: Diagnosis not present

## 2020-12-27 DIAGNOSIS — M6389 Disorders of muscle in diseases classified elsewhere, multiple sites: Secondary | ICD-10-CM | POA: Diagnosis not present

## 2020-12-27 DIAGNOSIS — I10 Essential (primary) hypertension: Secondary | ICD-10-CM | POA: Diagnosis not present

## 2020-12-27 DIAGNOSIS — D508 Other iron deficiency anemias: Secondary | ICD-10-CM | POA: Diagnosis not present

## 2020-12-27 DIAGNOSIS — F320 Major depressive disorder, single episode, mild: Secondary | ICD-10-CM

## 2020-12-27 DIAGNOSIS — Z4789 Encounter for other orthopedic aftercare: Secondary | ICD-10-CM | POA: Diagnosis not present

## 2020-12-27 DIAGNOSIS — E538 Deficiency of other specified B group vitamins: Secondary | ICD-10-CM

## 2020-12-27 DIAGNOSIS — R630 Anorexia: Secondary | ICD-10-CM

## 2020-12-27 DIAGNOSIS — E871 Hypo-osmolality and hyponatremia: Secondary | ICD-10-CM | POA: Diagnosis not present

## 2020-12-27 NOTE — Progress Notes (Signed)
Location:  [facility] Room Number: 154 Place of Service:  SNF ([phone]) Provider:  Windell Carroll, AGNP-C  Amy Seashore, MD  Patient Care Team: Amy Seashore, MD as PCP - General (Internal Medicine)  Extended Emergency Contact Information Primary Emergency Contact: Carroll,Amy Address: [address]          [place],  [address] [place] Phone: [phone] Mobile Phone: [phone] Relation: Spouse  Code Status:  Full code Goals of care: Advanced Directive information Advanced Directives 12/26/2020  Does Patient Have a Medical Advance Directive? Yes  Type of Paramedic of [place];Living will  Does patient want to make changes to medical advance directive? No - Patient declined  Copy of [place] in Chart? No - copy requested  Would patient like information on creating a medical advance directive? -  Pre-existing out of facility DNR order (yellow form or pink MOST form) -     Chief Complaint  Patient presents with   Acute Visit    Hyponatremia, depression    HPI:  Pt is a 86 y.o. female seen today for acute visit due to hyponatremia/ increased confusion.   She recently had a LTKR by Dr. Wynelle Link 10/17-10/19. She returned to hospital 10/26-10/28 due to left leg swelling and worsening pain. She was found to be hyponatremic secondary to HCTZ. Left xrays noted moderate suprapatellar joint effusion, dopplers negative for DVT. She was given fluids and HCTZ discontinued.   At this time she is WBAT with walker. Pain tolerated with tylenol. LBM today. She is scheduled to see Dr. Wynelle Link this afternoon.   She was seen by Dr. Lyndel Safe yesterday. Cognition issues noted. MMSE 27/30 12/17/2020. MRI brain 04/2020 revealed mild age related changes of small vessel disease and generalized cerebral atrophy. She continues to have difficultly with following instruction.Today, she is  having trouble recalling information about her recent knee surgery. She keeps saying " I have no short term memory." Husband also reports she believes right knee "does not work" and will not move it at times.   Husband also concerned about depression. Reports she use to be on antidepressant in the past, cannot recall medication. She denies depression today. Treatment options discussed with husband. Will re-evaluate for depression in one month.   No recent falls, injuries or behavioral outbursts.     Past Medical History:  Diagnosis Date   Anemia    Arthritis    Gait abnormality 06/20/2020   Hypertension    Past Surgical History:  Procedure Laterality Date   ABDOMINAL HYSTERECTOMY     COLONOSCOPY     HEMANGIOMA EXCISION     JOINT REPLACEMENT     TONSILLECTOMY     TOTAL HIP ARTHROPLASTY Right 02/18/2019   Procedure: TOTAL HIP ARTHROPLASTY ANTERIOR APPROACH;  Surgeon: Gaynelle Arabian, MD;  Location: [facility];  Service: Orthopedics;  Laterality: Right;  139min   TOTAL KNEE ARTHROPLASTY Right 05/27/2012   Procedure: TOTAL KNEE ARTHROPLASTY;  Surgeon: Meredith Pel, MD;  [facility];  Service: Orthopedics;  Laterality: Right;  Right Total Knee Arthroplasty   TOTAL KNEE ARTHROPLASTY Left 12/12/2020   Procedure: TOTAL KNEE ARTHROPLASTY;  Surgeon: Gaynelle Arabian, MD;  [facility];  Service: Orthopedics;  Laterality: Left;   TOTAL KNEE REVISION Right 08/06/2018   Procedure: TOTAL KNEE REVISION;  Surgeon: Gaynelle Arabian, MD;  [facility];  Service: Orthopedics;  Laterality: Right;   TUBAL LIGATION      No Known Allergies  Outpatient Encounter Medications as of 12/27/2020  Medication Sig   acetaminophen (TYLENOL) 500 MG tablet Take 500 mg by mouth every 6 (six) hours as needed for moderate pain.   amLODipine (NORVASC) 5 MG tablet Take 5 mg by mouth daily.    aspirin 325 MG EC tablet Take 1 tablet (325 mg total) by mouth 2 (two) times daily for 19 days. Then take one 81 mg  aspirin once a day for three weeks. Then discontinue aspirin.   [START ON 01/02/2021] aspirin EC 81 MG tablet Take 81 mg by mouth daily. Swallow whole.   bisacodyl (DULCOLAX) 10 MG suppository Place 10 mg rectally as needed for moderate constipation.   cholecalciferol (VITAMIN D3) 25 MCG (1000 UT) tablet Take 1,000 Units by mouth daily.   COMBIGAN 0.2-0.5 % ophthalmic solution Place 1 drop into the right eye in the morning and at bedtime.   Cyanocobalamin (B-12) 2500 MCG TABS Take 2,500 mcg by mouth daily.   dorzolamide (TRUSOPT) 2 % ophthalmic solution Place 1 drop into the right eye 2 (two) times daily.    ferrous sulfate 325 (65 FE) MG tablet Take 325 mg by mouth daily with breakfast.   latanoprost (XALATAN) 0.005 % ophthalmic solution Place 1 drop into both eyes at bedtime.    methocarbamol (ROBAXIN) 500 MG tablet Take 1 tablet (500 mg total) by mouth every 6 (six) hours as needed for muscle spasms. (Patient taking differently: Take 250 mg by mouth every 6 (six) hours as needed for muscle spasms.)   Multiple Vitamins-Minerals (PRESERVISION AREDS 2 PO) Take 1 capsule by mouth in the morning and at bedtime.   olmesartan (BENICAR) 40 MG tablet Take 1 tablet (40 mg total) by mouth daily.   omeprazole (PRILOSEC) 20 MG capsule Take 20 mg by mouth daily.   traMADol (ULTRAM) 50 MG tablet Take 1 tablet (50 mg total) by mouth every 6 (six) hours as needed for moderate pain. (Patient taking differently: Take 25 mg by mouth every 6 (six) hours as needed for moderate pain.)   No facility-administered encounter medications on file as of 12/27/2020.    Review of Systems  Unable to perform ROS: Dementia   Immunization History  Administered Date(s) Administered   Influenza-Unspecified 11/26/2017   Pertinent  Health Maintenance Due  Topic Date Due   DEXA SCAN  Never done   INFLUENZA VACCINE  Completed   Fall Risk 12/14/2020 12/21/2020 12/22/2020 12/22/2020 12/23/2020  Patient Fall Risk Level High fall  risk High fall risk High fall risk High fall risk High fall risk   Functional Status Survey:    Vitals:   12/27/20 1456  BP: 122/70  Pulse: 78  Resp: 16  Temp: 97.7 F (36.5 C)  SpO2: 99%  Weight: 127 lb 3.2 oz (57.7 kg)  Height: 5\' 3"  (1.6 m)   Body mass index is 22.53 kg/m. Physical Exam Vitals reviewed.  Constitutional:      General: She is not in acute distress. HENT:     Head: Normocephalic.  Eyes:     General:        Right eye: No discharge.        Left eye: No discharge.  Neck:     Vascular: No carotid bruit.  Cardiovascular:     Rate and Rhythm: Normal rate and regular rhythm.     Pulses: Normal pulses.     Heart sounds: Normal heart sounds. No murmur heard. Pulmonary:     Effort: Pulmonary effort is normal. No respiratory  distress.     Breath sounds: Normal breath sounds. No wheezing.  Abdominal:     General: Bowel sounds are normal. There is no distension.     Palpations: Abdomen is soft.     Tenderness: There is no abdominal tenderness.  Musculoskeletal:     Cervical back: Normal range of motion.     Right lower leg: No swelling or tenderness. No edema.     Left lower leg: Swelling present. No tenderness. No edema.     Comments: Aquacell to left knee with old drainage marked, UTA incision, left knee appears swollen with mild warmth, surrounding skin intact.   Lymphadenopathy:     Cervical: No cervical adenopathy.  Skin:    General: Skin is warm and dry.     Capillary Refill: Capillary refill takes less than 2 seconds.  Neurological:     General: No focal deficit present.     Mental Status: She is alert. Mental status is at baseline.     Motor: Weakness present.     Gait: Gait abnormal.  Psychiatric:        Mood and Affect: Mood normal.        Behavior: Behavior normal.        Cognition and Memory: Memory is impaired.     Comments: Alert to self, familiar faces,and place. Discontinued to time and situation    Labs reviewed: Recent Labs     12/21/20 1721 12/22/20 1218 12/23/20 0433  NA 125* 127* 129*  K 4.6 4.2 3.7  CL 94* 94* 95*  CO2 24 24 23   GLUCOSE 119* 127* 108*  BUN 13 15 14   CREATININE 0.85 0.64 0.75  CALCIUM 8.5* 8.8* 8.6*  MG 1.8  --   --   PHOS 2.2*  --   --    Recent Labs    11/23/20 1434 12/02/20 1443 12/21/20 1721  AST 11* 14* 21  ALT 10 12 27   ALKPHOS 81 66 122  BILITOT 0.4 0.3 0.5  PROT 7.2 7.1 6.9  ALBUMIN 3.2* 3.4* 3.4*   Recent Labs    11/23/20 1434 12/02/20 1443 12/13/20 0317 12/14/20 0325 12/19/20 0000 12/21/20 1721  WBC 6.3   < > 8.0 7.6 10.4 11.6*  NEUTROABS 3.9  --   --   --   --  9.2*  HGB 9.8*   < > 9.1* 9.4* 9.2* 10.4*  HCT 29.6*   < > 27.8* 29.1* 26* 31.9*  MCV 92.5   < > 96.9 96.4  --  94.4  PLT 347   < > 262 264 268 349   < > = values in this interval not displayed.   Lab Results  Component Value Date   TSH 1.488 12/21/2020   No results found for: HGBA1C No results found for: CHOL, HDL, LDLCALC, LDLDIRECT, TRIG, CHOLHDL  Significant Diagnostic Results in last 30 days:  DG Knee 2 Views Left  Result Date: 12/21/2020 CLINICAL DATA:  Knee replacement on Monday presenting with significant pain and swelling to the calf with pain radiating to the upper thigh and down to the ankle. EXAM: LEFT KNEE - 1-2 VIEW COMPARISON:  Left knee radiograph 10/21/2017 FINDINGS: Diffuse osteopenia. Interval postoperative changes of left total knee arthroplasty with cemented femoral and tibial components. Hardware is intact and in appropriate position without evidence of complication. Moderate suprapatellar knee joint effusion. Diffuse soft tissue swelling about the knee. Benign-appearing oblong lucent lesion in the distal femoral shaft with narrow transition zone and sharp margins,  possibly surgery related. IMPRESSION: Interval postoperative changes of left total knee arthroplasty. Hardware is intact without evidence of complication. Moderate suprapatellar left knee joint effusion. Soft tissue  swelling around the knee. Electronically Signed   By: Ileana Roup M.D.   On: 12/21/2020 17:47   US Venous Img Lower Unilateral Left  Result Date: 12/21/2020 CLINICAL DATA:  Left total knee arthroplasty with knee and calf edema. EXAM: LEFT LOWER EXTREMITY VENOUS DOPPLER ULTRASOUND TECHNIQUE: Gray-scale sonography with compression, as well as color and duplex ultrasound, were performed to evaluate the deep venous system(s) from the level of the common femoral vein through the popliteal and proximal calf veins. COMPARISON:  None. FINDINGS: VENOUS Normal compressibility of the common femoral, superficial femoral, and popliteal veins, as well as the visualized calf veins. Visualized portions of profunda femoral vein and great saphenous vein unremarkable. No filling defects to suggest DVT on grayscale or color Doppler imaging. Doppler waveforms show normal direction of venous flow, normal respiratory plasticity and response to augmentation. Limited views of the contralateral common femoral vein are unremarkable. OTHER None. Limitations: none IMPRESSION: Negative. Electronically Signed   By: Virgina Norfolk M.D.   On: 12/21/2020 19:46    Assessment/Plan 1. Hyponatremia - Na+130 12/26/2020 - appears more confused today- trouble with short term memory and instruction - repeat cmp 12/29/2020  2. Status post total left knee replacement - LTKR 10/26-10/28 - followed by Dr. Wynelle Link- appointment today - remains WBAT - left knee appears swollen and mildly warm  - LBM today - cont aspirin 325 mg po bid   3. Short-term memory loss - MMSE 27/30 12/17/2020 - MRI brain 04/2020 revealed mild age related changes of small vessel disease and generalized cerebral atrophy - suspect recent episode due to hyponatremia - see above  4. Essential hypertension - controlled - cont Benicar  5. Other iron deficiency anemia - hgb 10.4 12/21/2020 - cont ferrous sulfate  6. B12 deficiency - stable with B12 tablets  daily  7. Depression, major, single episode, mild (HCC) - cannot evaluate due to confusion - past use of antidepressant? - cannot name - plan to re-evaluate in 1 month   8. Poor appetite - supplement added yesterday    Family/ staff Communication: plan discussed with patient, husband and nurse  Labs/tests ordered:  cmp 12/29/2020

## 2020-12-28 DIAGNOSIS — R278 Other lack of coordination: Secondary | ICD-10-CM | POA: Diagnosis not present

## 2020-12-28 DIAGNOSIS — M6389 Disorders of muscle in diseases classified elsewhere, multiple sites: Secondary | ICD-10-CM | POA: Diagnosis not present

## 2020-12-28 DIAGNOSIS — R2689 Other abnormalities of gait and mobility: Secondary | ICD-10-CM | POA: Diagnosis not present

## 2020-12-28 DIAGNOSIS — R41841 Cognitive communication deficit: Secondary | ICD-10-CM | POA: Diagnosis not present

## 2020-12-28 DIAGNOSIS — R2681 Unsteadiness on feet: Secondary | ICD-10-CM | POA: Diagnosis not present

## 2020-12-28 DIAGNOSIS — M62562 Muscle wasting and atrophy, not elsewhere classified, left lower leg: Secondary | ICD-10-CM | POA: Diagnosis not present

## 2020-12-28 DIAGNOSIS — R41840 Attention and concentration deficit: Secondary | ICD-10-CM | POA: Diagnosis not present

## 2020-12-28 DIAGNOSIS — M25562 Pain in left knee: Secondary | ICD-10-CM | POA: Diagnosis not present

## 2020-12-28 DIAGNOSIS — I10 Essential (primary) hypertension: Secondary | ICD-10-CM | POA: Diagnosis not present

## 2020-12-28 DIAGNOSIS — Z96652 Presence of left artificial knee joint: Secondary | ICD-10-CM | POA: Diagnosis not present

## 2020-12-29 DIAGNOSIS — R278 Other lack of coordination: Secondary | ICD-10-CM | POA: Diagnosis not present

## 2020-12-29 DIAGNOSIS — R2681 Unsteadiness on feet: Secondary | ICD-10-CM | POA: Diagnosis not present

## 2020-12-29 DIAGNOSIS — I10 Essential (primary) hypertension: Secondary | ICD-10-CM | POA: Diagnosis not present

## 2020-12-29 DIAGNOSIS — M6389 Disorders of muscle in diseases classified elsewhere, multiple sites: Secondary | ICD-10-CM | POA: Diagnosis not present

## 2020-12-29 DIAGNOSIS — R41840 Attention and concentration deficit: Secondary | ICD-10-CM | POA: Diagnosis not present

## 2020-12-29 DIAGNOSIS — M62562 Muscle wasting and atrophy, not elsewhere classified, left lower leg: Secondary | ICD-10-CM | POA: Diagnosis not present

## 2020-12-29 DIAGNOSIS — R2689 Other abnormalities of gait and mobility: Secondary | ICD-10-CM | POA: Diagnosis not present

## 2020-12-29 DIAGNOSIS — Z96652 Presence of left artificial knee joint: Secondary | ICD-10-CM | POA: Diagnosis not present

## 2020-12-29 DIAGNOSIS — M25562 Pain in left knee: Secondary | ICD-10-CM | POA: Diagnosis not present

## 2020-12-29 DIAGNOSIS — D500 Iron deficiency anemia secondary to blood loss (chronic): Secondary | ICD-10-CM | POA: Diagnosis not present

## 2020-12-29 DIAGNOSIS — R41841 Cognitive communication deficit: Secondary | ICD-10-CM | POA: Diagnosis not present

## 2020-12-29 LAB — HEPATIC FUNCTION PANEL
ALT: 15 (ref 7–35)
AST: 16 (ref 13–35)
Alkaline Phosphatase: 130 — AB (ref 25–125)
Bilirubin, Total: 0.4

## 2020-12-29 LAB — BASIC METABOLIC PANEL
BUN: 9 (ref 4–21)
CO2: 21 (ref 13–22)
Chloride: 92 — AB (ref 99–108)
Creatinine: 0.6 (ref 0.5–1.1)
Glucose: 105
Glucose: 105
Potassium: 4.2 (ref 3.4–5.3)
Sodium: 128 — AB (ref 137–147)

## 2020-12-29 LAB — CBC AND DIFFERENTIAL
HCT: 31 — AB (ref 36–46)
Hemoglobin: 10.6 — AB (ref 12.0–16.0)
Platelets: 267 (ref 150–399)
WBC: 5.3

## 2020-12-29 LAB — COMPREHENSIVE METABOLIC PANEL
Albumin: 3.7 (ref 3.5–5.0)
Calcium: 9 (ref 8.7–10.7)
Calcium: 9 (ref 8.7–10.7)
GFR calc Af Amer: >90
GFR calc non Af Amer: 85.64
Globulin: 2.6

## 2020-12-29 LAB — CBC: RBC: 3.34 — AB (ref 3.87–5.11)

## 2020-12-30 ENCOUNTER — Encounter: Payer: Self-pay | Admitting: Adult Health

## 2020-12-30 ENCOUNTER — Non-Acute Institutional Stay (SKILLED_NURSING_FACILITY): Payer: Medicare Other | Admitting: Adult Health

## 2020-12-30 DIAGNOSIS — R410 Disorientation, unspecified: Secondary | ICD-10-CM | POA: Diagnosis not present

## 2020-12-30 DIAGNOSIS — R2681 Unsteadiness on feet: Secondary | ICD-10-CM | POA: Diagnosis not present

## 2020-12-30 DIAGNOSIS — I10 Essential (primary) hypertension: Secondary | ICD-10-CM | POA: Diagnosis not present

## 2020-12-30 DIAGNOSIS — R41841 Cognitive communication deficit: Secondary | ICD-10-CM | POA: Diagnosis not present

## 2020-12-30 DIAGNOSIS — M62562 Muscle wasting and atrophy, not elsewhere classified, left lower leg: Secondary | ICD-10-CM | POA: Diagnosis not present

## 2020-12-30 DIAGNOSIS — R2689 Other abnormalities of gait and mobility: Secondary | ICD-10-CM | POA: Diagnosis not present

## 2020-12-30 DIAGNOSIS — Z96652 Presence of left artificial knee joint: Secondary | ICD-10-CM | POA: Diagnosis not present

## 2020-12-30 DIAGNOSIS — E871 Hypo-osmolality and hyponatremia: Secondary | ICD-10-CM

## 2020-12-30 DIAGNOSIS — M25562 Pain in left knee: Secondary | ICD-10-CM | POA: Diagnosis not present

## 2020-12-30 NOTE — Progress Notes (Signed)
Location:   [facility] Room Number: 413 KGMWN of Service:  SNF [phone]) Provider:  Royal Hawthorn, NP  Merrilee Seashore, MD  Patient Care Team: Merrilee Seashore, MD as PCP - General (Internal Medicine)  Extended Emergency Contact Information Primary Emergency Contact: Colvin,[name] Address: [address]          [place], [place] [address] [place] Phone: [phone] Mobile Phone: [phone] Relation: Spouse  Code Status:  Full Code Goals of care: Advanced Directive information Advanced Directives 12/26/2020  Does Patient Have a Medical Advance Directive? Yes  Type of Paramedic of [place];Living will  Does patient want to make changes to medical advance directive? No - Patient declined  Copy of [place] in Chart? No - copy requested  Would patient like information on creating a medical advance directive? -  Pre-existing out of facility DNR order (yellow form or pink MOST form) -     Chief Complaint  Patient presents with  . Acute Visit    hyponatremia    HPI:  Pt is a 86 y.o. female seen today for an acute visit for hyponatremia. NA returned at 128 12/30/2020. Last check was 130. She was in the hospital  With NA of 125 thought to be due to HCTZ and dehydration which improved with NS.  She is drinking water frequently per staff but not eating well She is here due to total left knee replacement.  Staff have noted some confusion and inability to work with therapy. No signs of one sided weakness or speech issues.   Past Medical History:  Diagnosis Date  . Anemia   . Arthritis   . Gait abnormality 06/20/2020  . Hypertension    Past Surgical History:  Procedure Laterality Date  . ABDOMINAL HYSTERECTOMY    . COLONOSCOPY    . HEMANGIOMA EXCISION    . JOINT REPLACEMENT    . TONSILLECTOMY    . TOTAL HIP ARTHROPLASTY Right 02/18/2019   Procedure: TOTAL HIP  ARTHROPLASTY ANTERIOR APPROACH;  Surgeon: Gaynelle Arabian, MD;  Location: [facility];  Service: Orthopedics;  Laterality: Right;  142min  . TOTAL KNEE ARTHROPLASTY Right 05/27/2012   Procedure: TOTAL KNEE ARTHROPLASTY;  Surgeon: Meredith Pel, MD;  Location: [facility];  Service: Orthopedics;  Laterality: Right;  Right Total Knee Arthroplasty  . TOTAL KNEE ARTHROPLASTY Left 12/12/2020   Procedure: TOTAL KNEE ARTHROPLASTY;  Surgeon: Gaynelle Arabian, MD;  [facility];  Service: Orthopedics;  Laterality: Left;  . TOTAL KNEE REVISION Right 08/06/2018   Procedure: TOTAL KNEE REVISION;  Surgeon: Gaynelle Arabian, MD;  [facility];  Service: Orthopedics;  Laterality: Right;  . TUBAL LIGATION      No Known Allergies  Allergies as of 12/30/2020   No Known Allergies      Medication List        Accurate as of December 30, 2020 11:33 AM. If you have any questions, ask your nurse or doctor.          acetaminophen 500 MG tablet Commonly known as: TYLENOL Take 500 mg by mouth every 6 (six) hours as needed for moderate pain.   acetaminophen 325 MG tablet Commonly known as: TYLENOL Take 650 mg by mouth 2 (two) times daily.   amLODipine 5 MG tablet Commonly known as: NORVASC Take 5 mg by mouth daily.   aspirin 325 MG EC tablet Take 1 tablet (325 mg total) by mouth 2 (two) times daily for 19  days. Then take one 81 mg aspirin once a day for three weeks. Then discontinue aspirin.   aspirin EC 81 MG tablet Take 81 mg by mouth daily. Swallow whole. Start taking on: January 02, 2021   B-12 2500 MCG Tabs Take 2,500 mcg by mouth daily.   cholecalciferol 25 MCG (1000 UNIT) tablet Commonly known as: VITAMIN D3 Take 1,000 Units by mouth daily.   Combigan 0.2-0.5 % ophthalmic solution Generic drug: brimonidine-timolol Place 1 drop into the right eye in the morning and at bedtime.   dorzolamide 2 % ophthalmic solution Commonly known as: TRUSOPT Place 1 drop into the right eye 2 (two) times  daily.   ferrous sulfate 325 (65 FE) MG tablet Take 325 mg by mouth daily with breakfast.   latanoprost 0.005 % ophthalmic solution Commonly known as: XALATAN Place 1 drop into both eyes at bedtime.   methocarbamol 500 MG tablet Commonly known as: ROBAXIN Take 250 mg by mouth every 6 (six) hours as needed for muscle spasms. What changed: Another medication with the same name was removed. Continue taking this medication, and follow the directions you see here. Changed by: Royal Hawthorn, NP   olmesartan 40 MG tablet Commonly known as: BENICAR Take 1 tablet (40 mg total) by mouth daily.   omeprazole 20 MG capsule Commonly known as: PRILOSEC Take 20 mg by mouth daily.   PRESERVISION AREDS 2 PO Take 1 capsule by mouth in the morning and at bedtime.   traMADol 50 MG tablet Commonly known as: ULTRAM Take 25 mg by mouth every 6 (six) hours as needed. What changed: Another medication with the same name was removed. Continue taking this medication, and follow the directions you see here. Changed by: Royal Hawthorn, NP        Review of Systems  Constitutional:  Positive for activity change. Negative for appetite change, chills, diaphoresis, fatigue, fever and unexpected weight change.  HENT:  Negative for congestion.   Respiratory:  Negative for cough, shortness of breath and wheezing.   Cardiovascular:  Negative for chest pain, palpitations and leg swelling.  Gastrointestinal:  Negative for abdominal distention, abdominal pain, constipation and diarrhea.  Genitourinary:  Negative for difficulty urinating and dysuria.  Musculoskeletal:  Positive for gait problem. Negative for arthralgias, back pain, joint swelling and myalgias.       Knee pain  Skin:  Positive for color change and wound.  Neurological:  Negative for dizziness, tremors, seizures, syncope, facial asymmetry, speech difficulty, weakness, light-headedness, numbness and headaches.  Psychiatric/Behavioral:  Positive for  confusion. Negative for agitation and behavioral problems.    Immunization History  Administered Date(s) Administered  . Fluad Quad(high Dose 65+) 12/28/2019  . Influenza, High Dose Seasonal PF 12/07/2016, 12/21/2017  . Influenza, Quadrivalent, Recombinant, Inj, Pf 12/07/2017, 12/02/2020  . Influenza,inj,quad, With Preservative 11/27/2018  . Influenza-Unspecified 12/20/2011, 12/25/2012, 12/01/2013, 12/31/2013, 11/26/2017  . Moderna Sars-Covid-2 Vaccination 06/06/2020  . Pneumococcal Conjugate-13 02/06/2016  . Pneumococcal Polysaccharide-23 02/18/2002, 02/11/2017  . Unspecified SARS-COV-2 Vaccination 03/11/2019  . Zoster Recombinat (Shingrix) 02/15/2020, 06/01/2020   Pertinent  Health Maintenance Due  Topic Date Due  . DEXA SCAN  Never done  . INFLUENZA VACCINE  Completed   Fall Risk 12/14/2020 12/21/2020 12/22/2020 12/22/2020 12/23/2020  Patient Fall Risk Level High fall risk High fall risk High fall risk High fall risk High fall risk   Functional Status Survey:    Vitals:   12/30/20 1115  BP: 119/70  Pulse: 77  Resp: 16  Temp: 98.1 F (  36.7 C)  SpO2: 98%  Weight: 118 lb 6.4 oz (53.7 kg)  Height: 5\' 3"  (1.6 m)   Body mass index is 20.97 kg/m. Physical Exam Vitals and nursing note reviewed.  Constitutional:      General: She is not in acute distress.    Appearance: She is not diaphoretic.  HENT:     Head: Normocephalic and atraumatic.     Mouth/Throat:     Mouth: Mucous membranes are moist.     Pharynx: Oropharynx is clear.  Neck:     Vascular: No JVD.  Cardiovascular:     Rate and Rhythm: Normal rate and regular rhythm.     Heart sounds: No murmur heard. Pulmonary:     Effort: Pulmonary effort is normal. No respiratory distress.     Breath sounds: Normal breath sounds. No wheezing.  Musculoskeletal:     Right lower leg: No edema.     Left lower leg: Edema (at the knee around incidion site wit mild erythema) present.  Skin:    General: Skin is warm and dry.   Neurological:     General: No focal deficit present.     Mental Status: She is alert.     Cranial Nerves: No cranial nerve deficit.     Comments: Oriented x 2    Labs reviewed: Recent Labs    12/21/20 1721 12/22/20 1218 12/23/20 0433 12/26/20 0000 12/29/20 0000  NA 125* 127* 129* 130* 128*  K 4.6 4.2 3.7 5.4* 4.2  CL 94* 94* 95* 94* 92*  CO2 24 24 23  24* 21  GLUCOSE 119* 127* 108*  --   --   BUN 13 15 14 12 9   CREATININE 0.85 0.64 0.75 0.7 0.6  CALCIUM 8.5* 8.8* 8.6* 9.0 9.0  MG 1.8  --   --   --   --   PHOS 2.2*  --   --   --   --    Recent Labs    11/23/20 1434 12/02/20 1443 12/21/20 1721 12/29/20 0000  AST 11* 14* 21 16  ALT 10 12 27 15   ALKPHOS 81 66 122 130*  BILITOT 0.4 0.3 0.5  --   PROT 7.2 7.1 6.9  --   ALBUMIN 3.2* 3.4* 3.4* 3.7   Recent Labs    11/23/20 1434 12/02/20 1443 12/13/20 0317 12/14/20 0325 12/19/20 0000 12/21/20 1721 12/29/20 0000  WBC 6.3   < > 8.0 7.6 10.4 11.6* 5.3  NEUTROABS 3.9  --   --   --   --  9.2*  --   HGB 9.8*   < > 9.1* 9.4* 9.2* 10.4* 10.6*  HCT 29.6*   < > 27.8* 29.1* 26* 31.9* 31*  MCV 92.5   < > 96.9 96.4  --  94.4  --   PLT 347   < > 262 264 268 349 267   < > = values in this interval not displayed.   Lab Results  Component Value Date   TSH 1.488 12/21/2020   No results found for: HGBA1C No results found for: CHOL, HDL, LDLCALC, LDLDIRECT, TRIG, CHOLHDL  Significant Diagnostic Results in last 30 days:  DG Knee 2 Views Left  Result Date: 12/21/2020 CLINICAL DATA:  Knee replacement on Monday presenting with significant pain and swelling to the calf with pain radiating to the upper thigh and down to the ankle. EXAM: LEFT KNEE - 1-2 VIEW COMPARISON:  Left knee radiograph 10/21/2017 FINDINGS: Diffuse osteopenia. Interval postoperative changes of left total  knee arthroplasty with cemented femoral and tibial components. Hardware is intact and in appropriate position without evidence of complication. Moderate  suprapatellar knee joint effusion. Diffuse soft tissue swelling about the knee. Benign-appearing oblong lucent lesion in the distal femoral shaft with narrow transition zone and sharp margins, possibly surgery related. IMPRESSION: Interval postoperative changes of left total knee arthroplasty. Hardware is intact without evidence of complication. Moderate suprapatellar left knee joint effusion. Soft tissue swelling around the knee. Electronically Signed   By: Ileana Roup M.D.   On: 12/21/2020 17:47   US Venous Img Lower Unilateral Left  Result Date: 12/21/2020 CLINICAL DATA:  Left total knee arthroplasty with knee and calf edema. EXAM: LEFT LOWER EXTREMITY VENOUS DOPPLER ULTRASOUND TECHNIQUE: Gray-scale sonography with compression, as well as color and duplex ultrasound, were performed to evaluate the deep venous system(s) from the level of the common femoral vein through the popliteal and proximal calf veins. COMPARISON:  None. FINDINGS: VENOUS Normal compressibility of the common femoral, superficial femoral, and popliteal veins, as well as the visualized calf veins. Visualized portions of profunda femoral vein and great saphenous vein unremarkable. No filling defects to suggest DVT on grayscale or color Doppler imaging. Doppler waveforms show normal direction of venous flow, normal respiratory plasticity and response to augmentation. Limited views of the contralateral common femoral vein are unremarkable. OTHER None. Limitations: none IMPRESSION: Negative. Electronically Signed   By: Virgina Norfolk M.D.   On: 12/21/2020 19:46    Assessment/Plan  1. Hyponatremia Na down to 128. Drinking water frequently not eating adequate solid intake, has weight loss Does not appear volume overloaded Try gatorade, salty items, soup, no more than 1800 fluid per day Recheck BMP monday  2. Acute delirium Since surgery she has had some confusion. In conversation with her husband he had not noted memory loss prior  to her surgery. Would consider this to be delirium that is multifactorial at this time due to meds, low sodium, surgery, change of environment. If  this does not improve after discharge would re eval   Family/ staff Communication: discussed with Nyoka Lint  Labs/tests ordered:   [facility] 11/7

## 2021-01-02 DIAGNOSIS — M6389 Disorders of muscle in diseases classified elsewhere, multiple sites: Secondary | ICD-10-CM | POA: Diagnosis not present

## 2021-01-02 DIAGNOSIS — I10 Essential (primary) hypertension: Secondary | ICD-10-CM | POA: Diagnosis not present

## 2021-01-02 DIAGNOSIS — R2681 Unsteadiness on feet: Secondary | ICD-10-CM | POA: Diagnosis not present

## 2021-01-02 DIAGNOSIS — R41840 Attention and concentration deficit: Secondary | ICD-10-CM | POA: Diagnosis not present

## 2021-01-02 DIAGNOSIS — Z96652 Presence of left artificial knee joint: Secondary | ICD-10-CM | POA: Diagnosis not present

## 2021-01-02 DIAGNOSIS — R41841 Cognitive communication deficit: Secondary | ICD-10-CM | POA: Diagnosis not present

## 2021-01-02 DIAGNOSIS — M25562 Pain in left knee: Secondary | ICD-10-CM | POA: Diagnosis not present

## 2021-01-02 DIAGNOSIS — E871 Hypo-osmolality and hyponatremia: Secondary | ICD-10-CM | POA: Diagnosis not present

## 2021-01-02 DIAGNOSIS — R278 Other lack of coordination: Secondary | ICD-10-CM | POA: Diagnosis not present

## 2021-01-02 DIAGNOSIS — M62562 Muscle wasting and atrophy, not elsewhere classified, left lower leg: Secondary | ICD-10-CM | POA: Diagnosis not present

## 2021-01-02 DIAGNOSIS — R2689 Other abnormalities of gait and mobility: Secondary | ICD-10-CM | POA: Diagnosis not present

## 2021-01-02 LAB — BASIC METABOLIC PANEL
BUN: 14 (ref 4–21)
CO2: 24 — AB (ref 13–22)
Chloride: 100 (ref 99–108)
Creatinine: 0.7 (ref 0.5–1.1)
Glucose: 106
Potassium: 4.5 (ref 3.4–5.3)
Sodium: 134 — AB (ref 137–147)

## 2021-01-02 LAB — COMPREHENSIVE METABOLIC PANEL: Calcium: 8.9 (ref 8.7–10.7)

## 2021-01-03 DIAGNOSIS — R2689 Other abnormalities of gait and mobility: Secondary | ICD-10-CM | POA: Diagnosis not present

## 2021-01-03 DIAGNOSIS — Z96652 Presence of left artificial knee joint: Secondary | ICD-10-CM | POA: Diagnosis not present

## 2021-01-03 DIAGNOSIS — R41841 Cognitive communication deficit: Secondary | ICD-10-CM | POA: Diagnosis not present

## 2021-01-03 DIAGNOSIS — M6389 Disorders of muscle in diseases classified elsewhere, multiple sites: Secondary | ICD-10-CM | POA: Diagnosis not present

## 2021-01-03 DIAGNOSIS — M62562 Muscle wasting and atrophy, not elsewhere classified, left lower leg: Secondary | ICD-10-CM | POA: Diagnosis not present

## 2021-01-03 DIAGNOSIS — R278 Other lack of coordination: Secondary | ICD-10-CM | POA: Diagnosis not present

## 2021-01-03 DIAGNOSIS — I10 Essential (primary) hypertension: Secondary | ICD-10-CM | POA: Diagnosis not present

## 2021-01-03 DIAGNOSIS — M25562 Pain in left knee: Secondary | ICD-10-CM | POA: Diagnosis not present

## 2021-01-03 DIAGNOSIS — R41840 Attention and concentration deficit: Secondary | ICD-10-CM | POA: Diagnosis not present

## 2021-01-03 DIAGNOSIS — R2681 Unsteadiness on feet: Secondary | ICD-10-CM | POA: Diagnosis not present

## 2021-01-04 DIAGNOSIS — R278 Other lack of coordination: Secondary | ICD-10-CM | POA: Diagnosis not present

## 2021-01-04 DIAGNOSIS — I10 Essential (primary) hypertension: Secondary | ICD-10-CM | POA: Diagnosis not present

## 2021-01-04 DIAGNOSIS — R41840 Attention and concentration deficit: Secondary | ICD-10-CM | POA: Diagnosis not present

## 2021-01-04 DIAGNOSIS — R41841 Cognitive communication deficit: Secondary | ICD-10-CM | POA: Diagnosis not present

## 2021-01-04 DIAGNOSIS — R2689 Other abnormalities of gait and mobility: Secondary | ICD-10-CM | POA: Diagnosis not present

## 2021-01-04 DIAGNOSIS — M6389 Disorders of muscle in diseases classified elsewhere, multiple sites: Secondary | ICD-10-CM | POA: Diagnosis not present

## 2021-01-04 DIAGNOSIS — Z96652 Presence of left artificial knee joint: Secondary | ICD-10-CM | POA: Diagnosis not present

## 2021-01-04 DIAGNOSIS — R2681 Unsteadiness on feet: Secondary | ICD-10-CM | POA: Diagnosis not present

## 2021-01-04 DIAGNOSIS — M62562 Muscle wasting and atrophy, not elsewhere classified, left lower leg: Secondary | ICD-10-CM | POA: Diagnosis not present

## 2021-01-04 DIAGNOSIS — M25562 Pain in left knee: Secondary | ICD-10-CM | POA: Diagnosis not present

## 2021-01-05 DIAGNOSIS — R2689 Other abnormalities of gait and mobility: Secondary | ICD-10-CM | POA: Diagnosis not present

## 2021-01-05 DIAGNOSIS — I10 Essential (primary) hypertension: Secondary | ICD-10-CM | POA: Diagnosis not present

## 2021-01-05 DIAGNOSIS — M6389 Disorders of muscle in diseases classified elsewhere, multiple sites: Secondary | ICD-10-CM | POA: Diagnosis not present

## 2021-01-05 DIAGNOSIS — R41840 Attention and concentration deficit: Secondary | ICD-10-CM | POA: Diagnosis not present

## 2021-01-05 DIAGNOSIS — Z96652 Presence of left artificial knee joint: Secondary | ICD-10-CM | POA: Diagnosis not present

## 2021-01-05 DIAGNOSIS — M62562 Muscle wasting and atrophy, not elsewhere classified, left lower leg: Secondary | ICD-10-CM | POA: Diagnosis not present

## 2021-01-05 DIAGNOSIS — R2681 Unsteadiness on feet: Secondary | ICD-10-CM | POA: Diagnosis not present

## 2021-01-05 DIAGNOSIS — M25562 Pain in left knee: Secondary | ICD-10-CM | POA: Diagnosis not present

## 2021-01-05 DIAGNOSIS — R41841 Cognitive communication deficit: Secondary | ICD-10-CM | POA: Diagnosis not present

## 2021-01-05 DIAGNOSIS — R278 Other lack of coordination: Secondary | ICD-10-CM | POA: Diagnosis not present

## 2021-01-06 DIAGNOSIS — R41841 Cognitive communication deficit: Secondary | ICD-10-CM | POA: Diagnosis not present

## 2021-01-06 DIAGNOSIS — R2681 Unsteadiness on feet: Secondary | ICD-10-CM | POA: Diagnosis not present

## 2021-01-06 DIAGNOSIS — R278 Other lack of coordination: Secondary | ICD-10-CM | POA: Diagnosis not present

## 2021-01-06 DIAGNOSIS — M62562 Muscle wasting and atrophy, not elsewhere classified, left lower leg: Secondary | ICD-10-CM | POA: Diagnosis not present

## 2021-01-06 DIAGNOSIS — R2689 Other abnormalities of gait and mobility: Secondary | ICD-10-CM | POA: Diagnosis not present

## 2021-01-06 DIAGNOSIS — M6389 Disorders of muscle in diseases classified elsewhere, multiple sites: Secondary | ICD-10-CM | POA: Diagnosis not present

## 2021-01-06 DIAGNOSIS — Z96652 Presence of left artificial knee joint: Secondary | ICD-10-CM | POA: Diagnosis not present

## 2021-01-06 DIAGNOSIS — I10 Essential (primary) hypertension: Secondary | ICD-10-CM | POA: Diagnosis not present

## 2021-01-06 DIAGNOSIS — R41840 Attention and concentration deficit: Secondary | ICD-10-CM | POA: Diagnosis not present

## 2021-01-06 DIAGNOSIS — M25562 Pain in left knee: Secondary | ICD-10-CM | POA: Diagnosis not present

## 2021-01-09 DIAGNOSIS — I10 Essential (primary) hypertension: Secondary | ICD-10-CM | POA: Diagnosis not present

## 2021-01-09 DIAGNOSIS — M62562 Muscle wasting and atrophy, not elsewhere classified, left lower leg: Secondary | ICD-10-CM | POA: Diagnosis not present

## 2021-01-09 DIAGNOSIS — M6389 Disorders of muscle in diseases classified elsewhere, multiple sites: Secondary | ICD-10-CM | POA: Diagnosis not present

## 2021-01-09 DIAGNOSIS — M25562 Pain in left knee: Secondary | ICD-10-CM | POA: Diagnosis not present

## 2021-01-09 DIAGNOSIS — R2681 Unsteadiness on feet: Secondary | ICD-10-CM | POA: Diagnosis not present

## 2021-01-09 DIAGNOSIS — R2689 Other abnormalities of gait and mobility: Secondary | ICD-10-CM | POA: Diagnosis not present

## 2021-01-09 DIAGNOSIS — Z96652 Presence of left artificial knee joint: Secondary | ICD-10-CM | POA: Diagnosis not present

## 2021-01-09 DIAGNOSIS — R41840 Attention and concentration deficit: Secondary | ICD-10-CM | POA: Diagnosis not present

## 2021-01-09 DIAGNOSIS — R41841 Cognitive communication deficit: Secondary | ICD-10-CM | POA: Diagnosis not present

## 2021-01-09 DIAGNOSIS — R278 Other lack of coordination: Secondary | ICD-10-CM | POA: Diagnosis not present

## 2021-01-10 DIAGNOSIS — I10 Essential (primary) hypertension: Secondary | ICD-10-CM | POA: Diagnosis not present

## 2021-01-10 DIAGNOSIS — Z471 Aftercare following joint replacement surgery: Secondary | ICD-10-CM | POA: Diagnosis not present

## 2021-01-10 DIAGNOSIS — R2689 Other abnormalities of gait and mobility: Secondary | ICD-10-CM | POA: Diagnosis not present

## 2021-01-10 DIAGNOSIS — R2681 Unsteadiness on feet: Secondary | ICD-10-CM | POA: Diagnosis not present

## 2021-01-10 DIAGNOSIS — Z4789 Encounter for other orthopedic aftercare: Secondary | ICD-10-CM | POA: Diagnosis not present

## 2021-01-10 DIAGNOSIS — M62562 Muscle wasting and atrophy, not elsewhere classified, left lower leg: Secondary | ICD-10-CM | POA: Diagnosis not present

## 2021-01-10 DIAGNOSIS — R278 Other lack of coordination: Secondary | ICD-10-CM | POA: Diagnosis not present

## 2021-01-10 DIAGNOSIS — R41841 Cognitive communication deficit: Secondary | ICD-10-CM | POA: Diagnosis not present

## 2021-01-10 DIAGNOSIS — M6389 Disorders of muscle in diseases classified elsewhere, multiple sites: Secondary | ICD-10-CM | POA: Diagnosis not present

## 2021-01-10 DIAGNOSIS — R41840 Attention and concentration deficit: Secondary | ICD-10-CM | POA: Diagnosis not present

## 2021-01-10 DIAGNOSIS — M25562 Pain in left knee: Secondary | ICD-10-CM | POA: Diagnosis not present

## 2021-01-10 DIAGNOSIS — Z96652 Presence of left artificial knee joint: Secondary | ICD-10-CM | POA: Diagnosis not present

## 2021-01-11 ENCOUNTER — Other Ambulatory Visit: Payer: Self-pay | Admitting: Adult Health

## 2021-01-11 ENCOUNTER — Encounter: Payer: Self-pay | Admitting: Adult Health

## 2021-01-11 ENCOUNTER — Encounter (HOSPITAL_COMMUNITY): Payer: Self-pay | Admitting: Orthopedic Surgery

## 2021-01-11 ENCOUNTER — Non-Acute Institutional Stay (SKILLED_NURSING_FACILITY): Payer: Medicare Other | Admitting: Adult Health

## 2021-01-11 DIAGNOSIS — I10 Essential (primary) hypertension: Secondary | ICD-10-CM | POA: Diagnosis not present

## 2021-01-11 DIAGNOSIS — E871 Hypo-osmolality and hyponatremia: Secondary | ICD-10-CM

## 2021-01-11 DIAGNOSIS — F5101 Primary insomnia: Secondary | ICD-10-CM | POA: Diagnosis not present

## 2021-01-11 DIAGNOSIS — S86812A Strain of other muscle(s) and tendon(s) at lower leg level, left leg, initial encounter: Secondary | ICD-10-CM

## 2021-01-11 DIAGNOSIS — R350 Frequency of micturition: Secondary | ICD-10-CM

## 2021-01-11 DIAGNOSIS — R41841 Cognitive communication deficit: Secondary | ICD-10-CM | POA: Diagnosis not present

## 2021-01-11 DIAGNOSIS — Z96652 Presence of left artificial knee joint: Secondary | ICD-10-CM

## 2021-01-11 MED ORDER — HYDROCODONE-ACETAMINOPHEN 5-325 MG PO TABS: 1.0000 | ORAL_TABLET | Freq: Every evening | ORAL | 0 refills | Status: DC | PRN

## 2021-01-11 NOTE — Progress Notes (Addendum)
For Short Stay: [place] appointment date: arrival at 1315 test in short stay Date of COVID positive in last 90 days: NO   For Anesthesia: PCP - Dr. Veleta Miners [facility] while at [facility], last note from C. Wert NP 01/11/21 in epic Merrilee Seashore, MD as PCP - General (Internal Medicine) Cardiologist - Dr. Adrian Prows  Chest x-ray - greater than 1 year in epic EKG - 12/02/20 in epic Stress Test -  N/A ECHO -  N/A Cardiac Cath -  N/A Pacemaker/ICD device last checked: N/A  Sleep Study -  N/A CPAP -  N/A  Fasting Blood Sugar - N/A Checks Blood Sugar __ N/A___ times a day  Blood Thinner Instructions:  N/A Aspirin Instructions: Yes Last Dose: 01/11/2021  Activity level: Unable to go up a flight of stairs without chest pain and/or shortness of breath     Anesthesia review: N/A  Patient denies shortness of breath, fever, cough and chest pain at PAT appointment   Patient verbalized understanding of instructions that were given to them at the PAT appointment. Patient was also instructed that they will need to review over the PAT instructions again at home before surgery.

## 2021-01-11 NOTE — Progress Notes (Signed)
Location:  [facility] Room Number: 154-A Place of Service:  SNF [phone]) Provider:  Royal Hawthorn, NP   Patient Care Team: Merrilee Seashore, MD as PCP - General (Internal Medicine)  Extended Emergency Contact Information Primary Emergency Contact: Lofquist,[name] Address: [address]          [place], [place] [address] [place] Phone: [phone] Mobile Phone: [phone] Relation: Spouse  Code Status: Full Code  Goals of care: Advanced Directive information Advanced Directives 01/11/2021  Does Patient Have a Medical Advance Directive? Yes  Type of Paramedic of [place];Living will  Does patient want to make changes to medical advance directive? No - Patient declined  Copy of [place] in Chart? Yes - validated most recent copy scanned in chart (See row information)  Would patient like information on creating a medical advance directive? -  Pre-existing out of facility DNR order (yellow form or pink MOST form) -     Chief Complaint  Patient presents with   Acute Visit    Knee pain     HPI:  Pt is a 86 y.o. female seen today for an acute visit for knee pain. S/p admission from 12/12/20-12/14/20 for left knee OA s/p let total knee arthroplasty. Since surgery she has had some confusion and memory loss. She is experiencing increased knee pain and swelling for several days. She saw ortho this week and told her she had a patellar tendon fracture and she is going to have it repaired by surgery next week. She is using tylenol and ultram with little relief. She does not remember falling or injuring the knee but she did have some pain with therapy when bearing weight.  She has had issues with low sodium. ON FR 1800cc and increased sodium intake NA 134 01/02/21 She has frequency at night only. No burning or bladder pain. Nurses report she is not sleeping well, restless, agitated, and has  knee pain.      Past Medical History:  Diagnosis Date   Anemia    Arthritis    Gait abnormality 06/20/2020   Hypertension    Hyponatremia    Past Surgical History:  Procedure Laterality Date   ABDOMINAL HYSTERECTOMY     COLONOSCOPY     HEMANGIOMA EXCISION     JOINT REPLACEMENT     TONSILLECTOMY     TOTAL HIP ARTHROPLASTY Right 02/18/2019   Procedure: TOTAL HIP ARTHROPLASTY ANTERIOR APPROACH;  Surgeon: Gaynelle Arabian, MD;  Location: [facility];  Service: Orthopedics;  Laterality: Right;  113min   TOTAL KNEE ARTHROPLASTY Right 05/27/2012   Procedure: TOTAL KNEE ARTHROPLASTY;  Surgeon: Meredith Pel, MD;  Location: [facility];  Service: Orthopedics;  Laterality: Right;  Right Total Knee Arthroplasty   TOTAL KNEE ARTHROPLASTY Left 12/12/2020   Procedure: TOTAL KNEE ARTHROPLASTY;  Surgeon: Gaynelle Arabian, MD;  [facility];  Service: Orthopedics;  Laterality: Left;   TOTAL KNEE REVISION Right 08/06/2018   Procedure: TOTAL KNEE REVISION;  Surgeon: Gaynelle Arabian, MD;  [facility];  Service: Orthopedics;  Laterality: Right;   TUBAL LIGATION      No Known Allergies  Outpatient Encounter Medications as of 01/11/2021  Medication Sig   acetaminophen (TYLENOL) 325 MG tablet Take 650 mg by mouth 2 (two) times daily.   acetaminophen (TYLENOL) 500 MG tablet Take 500 mg by mouth every 6 (six) hours as needed for moderate pain.   amLODipine (NORVASC) 5 MG tablet Take 5 mg  by mouth daily.    aspirin EC 81 MG tablet Take 81 mg by mouth daily. Swallow whole.   cholecalciferol (VITAMIN D3) 25 MCG (1000 UT) tablet Take 1,000 Units by mouth daily.   COMBIGAN 0.2-0.5 % ophthalmic solution Place 1 drop into the right eye in the morning and at bedtime.   Cyanocobalamin (B-12) 2500 MCG TABS Take 2,500 mcg by mouth daily.   dorzolamide (TRUSOPT) 2 % ophthalmic solution Place 1 drop into the right eye 2 (two) times daily.    ferrous sulfate 325 (65 FE) MG tablet Take 325 mg by mouth daily with  breakfast.   HYDROcodone-acetaminophen (NORCO/VICODIN) 5-325 MG tablet Take 1 tablet by mouth at bedtime as needed for up to 14 days for moderate pain.   latanoprost (XALATAN) 0.005 % ophthalmic solution Place 1 drop into both eyes at bedtime.    methocarbamol (ROBAXIN) 500 MG tablet Take 250 mg by mouth every 6 (six) hours as needed for muscle spasms.   Multiple Vitamins-Minerals (PRESERVISION AREDS 2 PO) Take 1 capsule by mouth in the morning and at bedtime.   olmesartan (BENICAR) 40 MG tablet Take 1 tablet (40 mg total) by mouth daily.   polyethylene glycol (MIRALAX / GLYCOLAX) 17 g packet Take 17 g by mouth daily. In the morning between 8-11 am   traMADol (ULTRAM) 50 MG tablet Take 25 mg by mouth every 6 (six) hours as needed.   [DISCONTINUED] omeprazole (PRILOSEC) 20 MG capsule Take 20 mg by mouth daily.   No facility-administered encounter medications on file as of 01/11/2021.    Review of Systems  Constitutional:  Negative for activity change, appetite change, chills, diaphoresis, fatigue, fever and unexpected weight change.  HENT:  Negative for congestion.   Respiratory:  Negative for cough, shortness of breath and wheezing.   Cardiovascular:  Positive for leg swelling. Negative for chest pain and palpitations.  Gastrointestinal:  Negative for abdominal distention, abdominal pain, constipation and diarrhea.  Genitourinary:  Positive for frequency. Negative for difficulty urinating, dysuria, flank pain, hematuria, pelvic pain and urgency.  Musculoskeletal:  Positive for gait problem and joint swelling. Negative for arthralgias, back pain and myalgias.  Neurological:  Negative for dizziness, tremors, seizures, syncope, facial asymmetry, speech difficulty, weakness, light-headedness, numbness and headaches.  Psychiatric/Behavioral:  Positive for confusion and sleep disturbance. Negative for agitation and behavioral problems. The patient is nervous/anxious.    Immunization History   Administered Date(s) Administered   Fluad Quad(high Dose 65+) 12/28/2019   Influenza, High Dose Seasonal PF 12/07/2016, 12/21/2017   Influenza, Quadrivalent, Recombinant, Inj, Pf 12/07/2017, 12/02/2020   Influenza,inj,quad, With Preservative 11/27/2018   Influenza-Unspecified 12/20/2011, 12/25/2012, 12/01/2013, 12/31/2013, 11/26/2017   Moderna SARS-COV2 Booster Vaccination 01/12/2020, 12/09/2020   Moderna Sars-Covid-2 Vaccination 03/10/2019, 04/07/2019, 06/06/2020   Pneumococcal Conjugate-13 02/06/2016   Pneumococcal Polysaccharide-23 02/18/2002, 02/11/2017   Td (Adult) 03/02/2011   Unspecified SARS-COV-2 Vaccination 03/11/2019   Zoster Recombinat (Shingrix) 02/15/2020, 06/01/2020   Pertinent  Health Maintenance Due  Topic Date Due   DEXA SCAN  Never done   INFLUENZA VACCINE  Completed   Fall Risk 12/14/2020 12/21/2020 12/22/2020 12/22/2020 12/23/2020  Patient Fall Risk Level High fall risk High fall risk High fall risk High fall risk High fall risk   Functional Status Survey:    Vitals:   01/11/21 1126  BP: 134/78  Pulse: 83  Resp: 18  Temp: 97.8 F (36.6 C)  SpO2: 99%  Weight: 118 lb (53.5 kg)  Height: 5\' 3"  (1.6 m)   Body  mass index is 20.9 kg/m. Physical Exam Vitals and nursing note reviewed.  Constitutional:      General: She is not in acute distress.    Appearance: She is not diaphoretic.  HENT:     Head: Normocephalic and atraumatic.     Mouth/Throat:     Mouth: Mucous membranes are moist.     Pharynx: Oropharynx is clear.  Eyes:     Conjunctiva/sclera: Conjunctivae normal.     Pupils: Pupils are equal, round, and reactive to light.  Neck:     Vascular: No JVD.  Cardiovascular:     Rate and Rhythm: Normal rate and regular rhythm.     Heart sounds: No murmur heard. Pulmonary:     Effort: Pulmonary effort is normal. No respiratory distress.     Breath sounds: Normal breath sounds. No wheezing.  Abdominal:     General: Bowel sounds are normal. There  is no distension.     Palpations: Abdomen is soft.     Tenderness: There is no abdominal tenderness.  Musculoskeletal:        General: Swelling (left knee with warmth, erythema, and edema) present.     Right lower leg: No edema.     Left lower leg: No edema.     Comments: +CMS to LLE  Skin:    General: Skin is warm and dry.  Neurological:     Mental Status: She is alert and oriented to person, place, and time.     Comments: Forgetful of the details of her care  Psychiatric:        Mood and Affect: Mood normal.    Labs reviewed: Recent Labs    12/21/20 1721 12/22/20 1218 12/23/20 0433 12/26/20 0000 12/29/20 0000 01/02/21 0000  NA 125* 127* 129* 130* 128* 134*  K 4.6 4.2 3.7 5.4* 4.2 4.5  CL 94* 94* 95* 94* 92* 100  CO2 24 24 23  24* 21 24*  GLUCOSE 119* 127* 108*  --   --   --   BUN 13 15 14 12 9 14   CREATININE 0.85 0.64 0.75 0.7 0.6 0.7  CALCIUM 8.5* 8.8* 8.6* 9.0 9.0  9.0 8.9  MG 1.8  --   --   --   --   --   PHOS 2.2*  --   --   --   --   --    Recent Labs    11/23/20 1434 12/02/20 1443 12/21/20 1721 12/29/20 0000  AST 11* 14* 21 16  ALT 10 12 27 15   ALKPHOS 81 66 122 130*  BILITOT 0.4 0.3 0.5  --   PROT 7.2 7.1 6.9  --   ALBUMIN 3.2* 3.4* 3.4* 3.7   Recent Labs    11/23/20 1434 12/02/20 1443 12/13/20 0317 12/14/20 0325 12/19/20 0000 12/21/20 1721 12/29/20 0000  WBC 6.3   < > 8.0 7.6 10.4 11.6* 5.3  NEUTROABS 3.9  --   --   --   --  9.2*  --   HGB 9.8*   < > 9.1* 9.4* 9.2* 10.4* 10.6*  HCT 29.6*   < > 27.8* 29.1* 26* 31.9* 31*  MCV 92.5   < > 96.9 96.4  --  94.4  --   PLT 347   < > 262 264 268 349 267   < > = values in this interval not displayed.   Lab Results  Component Value Date   TSH 1.488 12/21/2020   No results found for: HGBA1C No results  found for: CHOL, HDL, LDLCALC, LDLDIRECT, TRIG, CHOLHDL  Significant Diagnostic Results in last 30 days:  DG Knee 2 Views Left  Result Date: 12/21/2020 CLINICAL DATA:  Knee replacement on Monday  presenting with significant pain and swelling to the calf with pain radiating to the upper thigh and down to the ankle. EXAM: LEFT KNEE - 1-2 VIEW COMPARISON:  Left knee radiograph 10/21/2017 FINDINGS: Diffuse osteopenia. Interval postoperative changes of left total knee arthroplasty with cemented femoral and tibial components. Hardware is intact and in appropriate position without evidence of complication. Moderate suprapatellar knee joint effusion. Diffuse soft tissue swelling about the knee. Benign-appearing oblong lucent lesion in the distal femoral shaft with narrow transition zone and sharp margins, possibly surgery related. IMPRESSION: Interval postoperative changes of left total knee arthroplasty. Hardware is intact without evidence of complication. Moderate suprapatellar left knee joint effusion. Soft tissue swelling around the knee. Electronically Signed   By: Ileana Roup M.D.   On: 12/21/2020 17:47   US Venous Img Lower Unilateral Left  Result Date: 12/21/2020 CLINICAL DATA:  Left total knee arthroplasty with knee and calf edema. EXAM: LEFT LOWER EXTREMITY VENOUS DOPPLER ULTRASOUND TECHNIQUE: Gray-scale sonography with compression, as well as color and duplex ultrasound, were performed to evaluate the deep venous system(s) from the level of the common femoral vein through the popliteal and proximal calf veins. COMPARISON:  None. FINDINGS: VENOUS Normal compressibility of the common femoral, superficial femoral, and popliteal veins, as well as the visualized calf veins. Visualized portions of profunda femoral vein and great saphenous vein unremarkable. No filling defects to suggest DVT on grayscale or color Doppler imaging. Doppler waveforms show normal direction of venous flow, normal respiratory plasticity and response to augmentation. Limited views of the contralateral common femoral vein are unremarkable. OTHER None. Limitations: none IMPRESSION: Negative. Electronically Signed   By: Virgina Norfolk M.D.   On: 12/21/2020 19:46    Assessment/Plan  1. Patellar tendon rupture, left, initial encounter Having more pain particularly at night, will order norco 1 tab qhs prn x 14 days  Surgery next week Staff will clarify weight bearing status.   2. Status post total left knee replacement Led to rehab admission   3. Hyponatremia Improved NA 134 with increased sodium in the diet and FR 1800 cc  4. Urinary frequency No other urinary symptoms, only present at night so likely not infection. I did offer to check a UA but she declined. She does not want to try medication. Recommend reducing fluid intake before bed, reduce alcohol intake (d/c wine) and avoid caffeine  5. Primary insomnia She is has had some delirium since surgery with negative UA 10/27, labs ok Continue to monitor.  Add melatonin 5 mg qhs    Family/ staff Communication: discussed with resident   Labs/tests ordered:  NA

## 2021-01-12 ENCOUNTER — Encounter (HOSPITAL_COMMUNITY): Payer: Self-pay | Admitting: Orthopedic Surgery

## 2021-01-12 DIAGNOSIS — Z96652 Presence of left artificial knee joint: Secondary | ICD-10-CM | POA: Diagnosis not present

## 2021-01-12 DIAGNOSIS — R2681 Unsteadiness on feet: Secondary | ICD-10-CM | POA: Diagnosis not present

## 2021-01-12 DIAGNOSIS — M25562 Pain in left knee: Secondary | ICD-10-CM | POA: Diagnosis not present

## 2021-01-12 DIAGNOSIS — R41841 Cognitive communication deficit: Secondary | ICD-10-CM | POA: Diagnosis not present

## 2021-01-12 DIAGNOSIS — M62562 Muscle wasting and atrophy, not elsewhere classified, left lower leg: Secondary | ICD-10-CM | POA: Diagnosis not present

## 2021-01-12 DIAGNOSIS — R2689 Other abnormalities of gait and mobility: Secondary | ICD-10-CM | POA: Diagnosis not present

## 2021-01-12 DIAGNOSIS — I10 Essential (primary) hypertension: Secondary | ICD-10-CM | POA: Diagnosis not present

## 2021-01-12 NOTE — Patient Instructions (Addendum)
Preop instructions for: Amy Carroll     Date of Birth:  12/05/1934                     Date of Procedure:   01/16/2021 Procedure: Left patellar tendon reconstruction     Surgeon: Dr. Gaynelle Arabian Facility contact: [facility]    Phone:  [phone]               Health Care IOX:BDZ[facility] Spouse [phone] RN contact name/phone#: Candida Peeling, RN Nurse Manager [phone]                       and Fax #: [phone]   Transportation contact phone#: [facility] [phone]    [facility] to arrive at [facility]: 1:15 PM   Report to: Admitting (On your left hand side)    Do not eat past midnight the night before your procedure.(To include any tube feedings-must be discontinued)  May continue liquids from Midnight until 1:15 PM day of procedure  CLEAR LIQUID DIET  Foods Allowed                                                                     Exclude  Water, Black Coffee, and tea, regular and decaf             ALL SOLID FOODS! Plain Jell-O in any flavor  (No red)                                     Fruit ices (not with fruit pulp)                                            MILK, SOUPS, Iced Popsicles (No red)                                                                            Apple juices                                                                         ORANGE JUICE Sports drinks like Gatorade (No red)                                  Lightly seasoned clear broth or consume(fat free) Sugar/Sweetner    Take these morning medications only with sips of water.(or give through gastrostomy or feeding tube).  Amlodipine, Acetaminophen  May use eyedrops per normal routine   Note: No Insulin or Diabetic meds should be given or taken the morning of the procedure!   Please send day of procedure:current med list and meds last taken that day, confirm nothing by mouth status from what time, Patient Demographic info( to include DNR status, problem  list, allergies)   Bring Insurance card and picture ID Leave all jewelry and other valuables at place where living( no metal or rings to be worn) No contact lens Women-no make-up, no lotions,perfumes,powders   Any questions day of procedure,call  SHORT STAY-[phone]     Sent from :[facility]                   Phone:[phone]                   Fax:[phone]   Sent by :  Harlon Flor BSN,             RN

## 2021-01-13 DIAGNOSIS — R2681 Unsteadiness on feet: Secondary | ICD-10-CM | POA: Diagnosis not present

## 2021-01-13 DIAGNOSIS — I10 Essential (primary) hypertension: Secondary | ICD-10-CM | POA: Diagnosis not present

## 2021-01-13 DIAGNOSIS — R2689 Other abnormalities of gait and mobility: Secondary | ICD-10-CM | POA: Diagnosis not present

## 2021-01-13 DIAGNOSIS — Z96652 Presence of left artificial knee joint: Secondary | ICD-10-CM | POA: Diagnosis not present

## 2021-01-13 DIAGNOSIS — M6389 Disorders of muscle in diseases classified elsewhere, multiple sites: Secondary | ICD-10-CM | POA: Diagnosis not present

## 2021-01-13 DIAGNOSIS — R41840 Attention and concentration deficit: Secondary | ICD-10-CM | POA: Diagnosis not present

## 2021-01-13 DIAGNOSIS — M62562 Muscle wasting and atrophy, not elsewhere classified, left lower leg: Secondary | ICD-10-CM | POA: Diagnosis not present

## 2021-01-13 DIAGNOSIS — R41841 Cognitive communication deficit: Secondary | ICD-10-CM | POA: Diagnosis not present

## 2021-01-13 DIAGNOSIS — R278 Other lack of coordination: Secondary | ICD-10-CM | POA: Diagnosis not present

## 2021-01-13 DIAGNOSIS — M25562 Pain in left knee: Secondary | ICD-10-CM | POA: Diagnosis not present

## 2021-01-16 ENCOUNTER — Inpatient Hospital Stay (HOSPITAL_COMMUNITY)
Admission: RE | Admit: 2021-01-16 | Discharge: 2021-01-18 | DRG: 502 | Disposition: A | Discharge status: Skilled Nursing Facility | Payer: Medicare Other | Source: Skilled Nursing Facility | Attending: Orthopedic Surgery | Admitting: Orthopedic Surgery

## 2021-01-16 ENCOUNTER — Ambulatory Visit (HOSPITAL_COMMUNITY): Payer: Medicare Other | Admitting: Certified Registered Nurse Anesthetist

## 2021-01-16 ENCOUNTER — Encounter (HOSPITAL_COMMUNITY): Payer: Self-pay | Admitting: Orthopedic Surgery

## 2021-01-16 ENCOUNTER — Encounter (HOSPITAL_COMMUNITY)
Admission: RE | Disposition: A | Discharge status: Skilled Nursing Facility | Payer: Self-pay | Source: Skilled Nursing Facility | Attending: Orthopedic Surgery

## 2021-01-16 ENCOUNTER — Other Ambulatory Visit: Payer: Self-pay

## 2021-01-16 DIAGNOSIS — Z7982 Long term (current) use of aspirin: Secondary | ICD-10-CM | POA: Diagnosis not present

## 2021-01-16 DIAGNOSIS — R2689 Other abnormalities of gait and mobility: Secondary | ICD-10-CM | POA: Diagnosis not present

## 2021-01-16 DIAGNOSIS — M1712 Unilateral primary osteoarthritis, left knee: Secondary | ICD-10-CM | POA: Diagnosis not present

## 2021-01-16 DIAGNOSIS — I10 Essential (primary) hypertension: Secondary | ICD-10-CM | POA: Diagnosis not present

## 2021-01-16 DIAGNOSIS — R7303 Prediabetes: Secondary | ICD-10-CM | POA: Diagnosis present

## 2021-01-16 DIAGNOSIS — S76112A Strain of left quadriceps muscle, fascia and tendon, initial encounter: Principal | ICD-10-CM | POA: Diagnosis present

## 2021-01-16 DIAGNOSIS — R410 Disorientation, unspecified: Secondary | ICD-10-CM | POA: Diagnosis not present

## 2021-01-16 DIAGNOSIS — M62562 Muscle wasting and atrophy, not elsewhere classified, left lower leg: Secondary | ICD-10-CM | POA: Diagnosis not present

## 2021-01-16 DIAGNOSIS — R278 Other lack of coordination: Secondary | ICD-10-CM | POA: Diagnosis not present

## 2021-01-16 DIAGNOSIS — Z20822 Contact with and (suspected) exposure to covid-19: Secondary | ICD-10-CM | POA: Diagnosis not present

## 2021-01-16 DIAGNOSIS — E871 Hypo-osmolality and hyponatremia: Secondary | ICD-10-CM | POA: Diagnosis not present

## 2021-01-16 DIAGNOSIS — Z96652 Presence of left artificial knee joint: Secondary | ICD-10-CM | POA: Diagnosis present

## 2021-01-16 DIAGNOSIS — X58XXXA Exposure to other specified factors, initial encounter: Secondary | ICD-10-CM | POA: Diagnosis present

## 2021-01-16 DIAGNOSIS — M66262 Spontaneous rupture of extensor tendons, left lower leg: Secondary | ICD-10-CM | POA: Diagnosis not present

## 2021-01-16 DIAGNOSIS — S86812D Strain of other muscle(s) and tendon(s) at lower leg level, left leg, subsequent encounter: Secondary | ICD-10-CM

## 2021-01-16 DIAGNOSIS — Z4789 Encounter for other orthopedic aftercare: Secondary | ICD-10-CM | POA: Diagnosis not present

## 2021-01-16 DIAGNOSIS — Z8249 Family history of ischemic heart disease and other diseases of the circulatory system: Secondary | ICD-10-CM | POA: Diagnosis not present

## 2021-01-16 DIAGNOSIS — M1990 Unspecified osteoarthritis, unspecified site: Secondary | ICD-10-CM | POA: Diagnosis present

## 2021-01-16 DIAGNOSIS — Z471 Aftercare following joint replacement surgery: Secondary | ICD-10-CM | POA: Diagnosis not present

## 2021-01-16 DIAGNOSIS — Z9851 Tubal ligation status: Secondary | ICD-10-CM | POA: Diagnosis not present

## 2021-01-16 DIAGNOSIS — F0390 Unspecified dementia without behavioral disturbance: Secondary | ICD-10-CM | POA: Diagnosis present

## 2021-01-16 DIAGNOSIS — Z79899 Other long term (current) drug therapy: Secondary | ICD-10-CM

## 2021-01-16 DIAGNOSIS — I959 Hypotension, unspecified: Secondary | ICD-10-CM | POA: Diagnosis not present

## 2021-01-16 DIAGNOSIS — Z7401 Bed confinement status: Secondary | ICD-10-CM | POA: Diagnosis not present

## 2021-01-16 DIAGNOSIS — S86812A Strain of other muscle(s) and tendon(s) at lower leg level, left leg, initial encounter: Secondary | ICD-10-CM | POA: Diagnosis not present

## 2021-01-16 DIAGNOSIS — G8918 Other acute postprocedural pain: Secondary | ICD-10-CM | POA: Diagnosis not present

## 2021-01-16 DIAGNOSIS — Z90710 Acquired absence of both cervix and uterus: Secondary | ICD-10-CM

## 2021-01-16 DIAGNOSIS — Z01818 Encounter for other preprocedural examination: Secondary | ICD-10-CM

## 2021-01-16 HISTORY — DX: Hypo-osmolality and hyponatremia: E87.1

## 2021-01-16 HISTORY — DX: Major depressive disorder, single episode, unspecified: F32.9

## 2021-01-16 HISTORY — DX: Unspecified osteoarthritis, unspecified site: M19.90

## 2021-01-16 HISTORY — PX: PATELLAR TENDON REPAIR: SHX737

## 2021-01-16 HISTORY — DX: Prediabetes: R73.03

## 2021-01-16 HISTORY — DX: Unspecified dementia, unspecified severity, without behavioral disturbance, psychotic disturbance, mood disturbance, and anxiety: F03.90

## 2021-01-16 HISTORY — DX: Iron deficiency anemia, unspecified: D50.9

## 2021-01-16 HISTORY — DX: Deficiency of other specified B group vitamins: E53.8

## 2021-01-16 LAB — SARS CORONAVIRUS 2 BY RT PCR (HOSPITAL ORDER, PERFORMED IN ~~LOC~~ HOSPITAL LAB): SARS Coronavirus 2: NEGATIVE

## 2021-01-16 SURGERY — REPAIR, TENDON, PATELLAR
Anesthesia: Spinal | Site: Knee | Laterality: Left

## 2021-01-16 MED ORDER — AMLODIPINE BESYLATE 5 MG PO TABS
5.0000 mg | ORAL_TABLET | Freq: Every morning | ORAL | Status: DC
  Administered 2021-01-17 – 2021-01-18 (×2): 5 mg via ORAL
  Filled 2021-01-16 (×2): qty 1

## 2021-01-16 MED ORDER — DEXAMETHASONE SODIUM PHOSPHATE 10 MG/ML IJ SOLN: 8.0000 mg | Freq: Once | INTRAMUSCULAR | Status: DC

## 2021-01-16 MED ORDER — PROPOFOL 10 MG/ML IV BOLUS
INTRAVENOUS | Status: DC | PRN
  Administered 2021-01-16: 20 mg via INTRAVENOUS
  Administered 2021-01-16 (×3): 10 mg via INTRAVENOUS
  Administered 2021-01-16: 20 mg via INTRAVENOUS

## 2021-01-16 MED ORDER — POVIDONE-IODINE 10 % EX SWAB: 2.0000 "application " | Freq: Once | CUTANEOUS | Status: DC

## 2021-01-16 MED ORDER — FENTANYL CITRATE PF 50 MCG/ML IJ SOSY
50.0000 ug | PREFILLED_SYRINGE | INTRAMUSCULAR | Status: DC
  Administered 2021-01-16: 50 ug via INTRAVENOUS
  Filled 2021-01-16: qty 2

## 2021-01-16 MED ORDER — IRBESARTAN 150 MG PO TABS
300.0000 mg | ORAL_TABLET | Freq: Every day | ORAL | Status: DC
  Administered 2021-01-17 – 2021-01-18 (×2): 300 mg via ORAL
  Filled 2021-01-16 (×2): qty 2

## 2021-01-16 MED ORDER — PHENYLEPHRINE HCL-NACL 20-0.9 MG/250ML-% IV SOLN
INTRAVENOUS | Status: DC | PRN
  Administered 2021-01-16: 50 ug/min via INTRAVENOUS

## 2021-01-16 MED ORDER — LACTATED RINGERS IV SOLN: INTRAVENOUS | Status: DC

## 2021-01-16 MED ORDER — LIDOCAINE HCL (CARDIAC) PF 100 MG/5ML IV SOSY
PREFILLED_SYRINGE | INTRAVENOUS | Status: DC | PRN
  Administered 2021-01-16: 20 mg via INTRAVENOUS

## 2021-01-16 MED ORDER — DOCUSATE SODIUM 100 MG PO CAPS
100.0000 mg | ORAL_CAPSULE | Freq: Two times a day (BID) | ORAL | Status: DC
  Administered 2021-01-17 – 2021-01-18 (×3): 100 mg via ORAL
  Filled 2021-01-16 (×4): qty 1

## 2021-01-16 MED ORDER — FENTANYL CITRATE PF 50 MCG/ML IJ SOSY: 25.0000 ug | PREFILLED_SYRINGE | INTRAMUSCULAR | Status: DC | PRN

## 2021-01-16 MED ORDER — METOCLOPRAMIDE HCL 5 MG/ML IJ SOLN: 5.0000 mg | Freq: Three times a day (TID) | INTRAMUSCULAR | Status: DC | PRN

## 2021-01-16 MED ORDER — ROPIVACAINE HCL 5 MG/ML IJ SOLN
INTRAMUSCULAR | Status: DC | PRN
  Administered 2021-01-16: 25 mL via PERINEURAL

## 2021-01-16 MED ORDER — FLEET ENEMA 7-19 GM/118ML RE ENEM: 1.0000 | ENEMA | Freq: Once | RECTAL | Status: DC | PRN

## 2021-01-16 MED ORDER — MENTHOL 3 MG MT LOZG: 1.0000 | LOZENGE | OROMUCOSAL | Status: DC | PRN

## 2021-01-16 MED ORDER — 0.9 % SODIUM CHLORIDE (POUR BTL) OPTIME
TOPICAL | Status: DC | PRN
  Administered 2021-01-16: 1000 mL

## 2021-01-16 MED ORDER — ONDANSETRON HCL 4 MG/2ML IJ SOLN: 4.0000 mg | Freq: Four times a day (QID) | INTRAMUSCULAR | Status: DC | PRN

## 2021-01-16 MED ORDER — ONDANSETRON HCL 4 MG/2ML IJ SOLN
INTRAMUSCULAR | Status: DC | PRN
  Administered 2021-01-16: 4 mg via INTRAVENOUS

## 2021-01-16 MED ORDER — POLYETHYLENE GLYCOL 3350 17 G PO PACK: 17.0000 g | PACK | Freq: Every day | ORAL | Status: DC | PRN

## 2021-01-16 MED ORDER — ORAL CARE MOUTH RINSE: 15.0000 mL | Freq: Once | OROMUCOSAL | Status: AC

## 2021-01-16 MED ORDER — CHLORHEXIDINE GLUCONATE 4 % EX LIQD: 60.0000 mL | Freq: Once | CUTANEOUS | Status: DC

## 2021-01-16 MED ORDER — MORPHINE SULFATE (PF) 2 MG/ML IV SOLN: 0.5000 mg | INTRAVENOUS | Status: DC | PRN

## 2021-01-16 MED ORDER — BUPIVACAINE IN DEXTROSE 0.75-8.25 % IT SOLN
INTRATHECAL | Status: DC | PRN
  Administered 2021-01-16: 1.4 mL via INTRATHECAL

## 2021-01-16 MED ORDER — TRAMADOL HCL 50 MG PO TABS
25.0000 mg | ORAL_TABLET | Freq: Four times a day (QID) | ORAL | Status: DC | PRN
  Administered 2021-01-17 – 2021-01-18 (×4): 25 mg via ORAL
  Filled 2021-01-16 (×4): qty 1

## 2021-01-16 MED ORDER — ACETAMINOPHEN 10 MG/ML IV SOLN
1000.0000 mg | Freq: Four times a day (QID) | INTRAVENOUS | Status: AC
  Administered 2021-01-16 – 2021-01-17 (×3): 1000 mg via INTRAVENOUS
  Filled 2021-01-16 (×3): qty 100

## 2021-01-16 MED ORDER — PHENYLEPHRINE 40 MCG/ML (10ML) SYRINGE FOR IV PUSH (FOR BLOOD PRESSURE SUPPORT)
PREFILLED_SYRINGE | INTRAVENOUS | Status: AC
  Filled 2021-01-16: qty 20

## 2021-01-16 MED ORDER — PROPOFOL 10 MG/ML IV BOLUS
INTRAVENOUS | Status: AC
  Filled 2021-01-16: qty 20

## 2021-01-16 MED ORDER — BISACODYL 10 MG RE SUPP: 10.0000 mg | Freq: Every day | RECTAL | Status: DC | PRN

## 2021-01-16 MED ORDER — AMISULPRIDE (ANTIEMETIC) 5 MG/2ML IV SOLN: 10.0000 mg | Freq: Once | INTRAVENOUS | Status: DC | PRN

## 2021-01-16 MED ORDER — PHENYLEPHRINE HCL (PRESSORS) 10 MG/ML IV SOLN
INTRAVENOUS | Status: DC | PRN
  Administered 2021-01-16: 120 ug via INTRAVENOUS
  Administered 2021-01-16: 40 ug via INTRAVENOUS
  Administered 2021-01-16 (×2): 120 ug via INTRAVENOUS

## 2021-01-16 MED ORDER — METOCLOPRAMIDE HCL 5 MG PO TABS: 5.0000 mg | ORAL_TABLET | Freq: Three times a day (TID) | ORAL | Status: DC | PRN

## 2021-01-16 MED ORDER — METHOCARBAMOL 500 MG PO TABS
250.0000 mg | ORAL_TABLET | Freq: Four times a day (QID) | ORAL | Status: DC | PRN
  Administered 2021-01-17: 250 mg via ORAL
  Filled 2021-01-16: qty 1

## 2021-01-16 MED ORDER — FERROUS SULFATE 325 (65 FE) MG PO TABS
325.0000 mg | ORAL_TABLET | Freq: Every day | ORAL | Status: DC
  Administered 2021-01-17 – 2021-01-18 (×2): 325 mg via ORAL
  Filled 2021-01-16 (×2): qty 1

## 2021-01-16 MED ORDER — ASPIRIN EC 81 MG PO TBEC
81.0000 mg | DELAYED_RELEASE_TABLET | Freq: Every morning | ORAL | Status: DC
  Administered 2021-01-17 – 2021-01-18 (×2): 81 mg via ORAL
  Filled 2021-01-16 (×2): qty 1

## 2021-01-16 MED ORDER — DIPHENHYDRAMINE HCL 12.5 MG/5ML PO ELIX: 12.5000 mg | ORAL_SOLUTION | ORAL | Status: DC | PRN

## 2021-01-16 MED ORDER — CHLORHEXIDINE GLUCONATE 0.12 % MT SOLN
15.0000 mL | Freq: Once | OROMUCOSAL | Status: AC
  Administered 2021-01-16: 15 mL via OROMUCOSAL

## 2021-01-16 MED ORDER — SODIUM CHLORIDE 0.9 % IV SOLN: INTRAVENOUS | Status: DC

## 2021-01-16 MED ORDER — PROPOFOL 500 MG/50ML IV EMUL
INTRAVENOUS | Status: DC | PRN
  Administered 2021-01-16: 50 ug/kg/min via INTRAVENOUS

## 2021-01-16 MED ORDER — PHENOL 1.4 % MT LIQD: 1.0000 | OROMUCOSAL | Status: DC | PRN

## 2021-01-16 MED ORDER — CEFAZOLIN SODIUM-DEXTROSE 2-4 GM/100ML-% IV SOLN
2.0000 g | INTRAVENOUS | Status: AC
  Administered 2021-01-16: 2 g via INTRAVENOUS
  Filled 2021-01-16: qty 100

## 2021-01-16 MED ORDER — MIDAZOLAM HCL 2 MG/2ML IJ SOLN
1.0000 mg | INTRAMUSCULAR | Status: DC
  Filled 2021-01-16: qty 2

## 2021-01-16 MED ORDER — BUPIVACAINE HCL 0.25 % IJ SOLN
INTRAMUSCULAR | Status: AC
  Filled 2021-01-16: qty 1

## 2021-01-16 MED ORDER — ONDANSETRON HCL 4 MG PO TABS: 4.0000 mg | ORAL_TABLET | Freq: Four times a day (QID) | ORAL | Status: DC | PRN

## 2021-01-16 SURGICAL SUPPLY — 48 items
ANCHOR SUPER QUICK (Anchor) ×2 IMPLANT
BAG COUNTER SPONGE SURGICOUNT (BAG) IMPLANT
BAG ZIPLOCK 12X15 (MISCELLANEOUS) IMPLANT
BANDAGE ESMARK 6X9 LF (GAUZE/BANDAGES/DRESSINGS) ×1 IMPLANT
BIT DRILL 2.4X128 (BIT) ×2 IMPLANT
BNDG ELASTIC 6X5.8 VLCR STR LF (GAUZE/BANDAGES/DRESSINGS) ×2 IMPLANT
BNDG ESMARK 6X9 LF (GAUZE/BANDAGES/DRESSINGS) ×2
COVER SURGICAL LIGHT HANDLE (MISCELLANEOUS) ×2 IMPLANT
CUFF TOURN SGL QUICK 34 (TOURNIQUET CUFF) ×2
CUFF TRNQT CYL 34X4.125X (TOURNIQUET CUFF) ×1 IMPLANT
DRAPE INCISE IOBAN 66X45 STRL (DRAPES) IMPLANT
DRAPE POUCH INSTRU U-SHP 10X18 (DRAPES) ×2 IMPLANT
DRAPE U-SHAPE 47X51 STRL (DRAPES) ×2 IMPLANT
DRSG ADAPTIC 3X8 NADH LF (GAUZE/BANDAGES/DRESSINGS) ×2 IMPLANT
DRSG AQUACEL AG ADV 3.5X10 (GAUZE/BANDAGES/DRESSINGS) ×2 IMPLANT
DRSG PAD ABDOMINAL 8X10 ST (GAUZE/BANDAGES/DRESSINGS) ×2 IMPLANT
DURAPREP 26ML APPLICATOR (WOUND CARE) ×2 IMPLANT
ELECT REM PT RETURN 15FT ADLT (MISCELLANEOUS) ×2 IMPLANT
GAUZE SPONGE 4X4 12PLY STRL (GAUZE/BANDAGES/DRESSINGS) ×2 IMPLANT
GLOVE SRG 8 PF TXTR STRL LF DI (GLOVE) ×2 IMPLANT
GLOVE SURG ENC MOIS LTX SZ6.5 (GLOVE) ×4 IMPLANT
GLOVE SURG ENC MOIS LTX SZ7.5 (GLOVE) ×2 IMPLANT
GLOVE SURG ENC MOIS LTX SZ8 (GLOVE) ×4 IMPLANT
GLOVE SURG POLYISO LF SZ7 (GLOVE) ×2 IMPLANT
GLOVE SURG UNDER POLY LF SZ7 (GLOVE) ×4 IMPLANT
GLOVE SURG UNDER POLY LF SZ8 (GLOVE) ×4
GOWN STRL REUS W/TWL LRG LVL3 (GOWN DISPOSABLE) ×6 IMPLANT
IMMOBILIZER KNEE 20 (SOFTGOODS) ×2
IMMOBILIZER KNEE 20 THIGH 36 (SOFTGOODS) ×1 IMPLANT
KIT TURNOVER KIT A (KITS) IMPLANT
MANIFOLD NEPTUNE II (INSTRUMENTS) ×2 IMPLANT
NEEDLE MA TROC 1/2 (NEEDLE) ×2 IMPLANT
NEEDLE MAYO CATGUT SZ4 (NEEDLE) IMPLANT
PACK TOTAL JOINT (CUSTOM PROCEDURE TRAY) ×2 IMPLANT
PADDING CAST COTTON 6X4 STRL (CAST SUPPLIES) ×2 IMPLANT
PASSER SUT SWANSON 36MM LOOP (INSTRUMENTS) ×2 IMPLANT
PROTECTOR NERVE ULNAR (MISCELLANEOUS) ×2 IMPLANT
STRIP CLOSURE SKIN 1/2X4 (GAUZE/BANDAGES/DRESSINGS) ×4 IMPLANT
SUT ETHIBOND NAB CT1 #1 30IN (SUTURE) ×4 IMPLANT
SUT FIBERWIRE #2 38 T-5 BLUE (SUTURE) ×4
SUT MNCRL AB 4-0 PS2 18 (SUTURE) ×2 IMPLANT
SUT VIC AB 1 CT1 27 (SUTURE)
SUT VIC AB 1 CT1 27XBRD ANTBC (SUTURE) IMPLANT
SUT VIC AB 2-0 CT1 27 (SUTURE) ×8
SUT VIC AB 2-0 CT1 TAPERPNT 27 (SUTURE) ×4 IMPLANT
SUTURE FIBERWR #2 38 T-5 BLUE (SUTURE) ×2 IMPLANT
TOWEL OR 17X26 10 PK STRL BLUE (TOWEL DISPOSABLE) ×2 IMPLANT
WRAP KNEE MAXI GEL POST OP (GAUZE/BANDAGES/DRESSINGS) ×2 IMPLANT

## 2021-01-16 NOTE — H&P (Signed)
ADMISSION H&P  Subjective:  HPI: Amy Carroll, 86 y.o. female presents for patellar tendon reconstruction of the left knee. Patient is s/p left TKA, was doing well postoperatively until she fell at her rehabilitation facility. Since that time, she had difficulty ambulating and increased pain with weightbearing. She presented to our clinic 2 weeks postoperatively. A soft tissue defect could be palpated, and radiographs showed superior migration of the patella. Additionally had notably increased swelling. She was unable to actively extend the knee, causing some concern. She presented back for a recheck, and radiographs showed further migration of the patella. It was determined at that point that she would require surgical reconstruction.  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   Allergic contact dermatitis due to metals 03/17/2018   Presence of right artificial knee joint 04/02/2016   Effusion, right knee 04/02/2016   Osteoarthritis of right knee 05/27/2012    Class: Diagnosis of    Past Medical History:  Diagnosis Date   Dementia ([place])    Gait abnormality 06/20/2020   Hypertension    Hyponatremia    Iron deficiency anemia    Major depression    OA (osteoarthritis)    Pre-diabetes    Vitamin B 12 deficiency     Past Surgical History:  Procedure Laterality Date   ABDOMINAL HYSTERECTOMY     COLONOSCOPY     HEMANGIOMA EXCISION     JOINT REPLACEMENT     TONSILLECTOMY     TOTAL HIP ARTHROPLASTY Right 02/18/2019   Procedure: TOTAL HIP ARTHROPLASTY ANTERIOR APPROACH;  Surgeon: Gaynelle Arabian, MD;  [facility];  Service: Orthopedics;  Laterality: Right;  129min   TOTAL KNEE ARTHROPLASTY Right 05/27/2012   Procedure: TOTAL KNEE ARTHROPLASTY;  Surgeon: Meredith Pel, MD;  Location: [facility];  Service: Orthopedics;  Laterality: Right;  Right Total Knee Arthroplasty   TOTAL KNEE ARTHROPLASTY Left 12/12/2020   Procedure: TOTAL KNEE ARTHROPLASTY;  Surgeon: Gaynelle Arabian, MD;  [facility];  Service: Orthopedics;  Laterality: Left;   TOTAL KNEE REVISION Right 08/06/2018   Procedure: TOTAL KNEE REVISION;  Surgeon: Gaynelle Arabian, MD;  [facility];  Service: Orthopedics;  Laterality: Right;   TUBAL LIGATION      Prior to Admission medications   Medication Sig Start Date End Date Taking? Authorizing Provider  acetaminophen (TYLENOL) 325 MG tablet Take 650 mg by mouth 2 (two) times daily.   Yes [provider]  acetaminophen (TYLENOL) 500 MG tablet Take 500 mg by mouth every 6 (six) hours as needed for moderate pain.   Yes [provider]  amLODipine (NORVASC) 5 MG tablet Take 5 mg by mouth in the morning. 02/06/16  Yes [provider]  aspirin EC 81 MG tablet Take 81 mg by mouth in the morning. Swallow whole. 01/02/21 01/24/21 Yes [provider]  cholecalciferol (VITAMIN D3) 25 MCG (1000 UT) tablet Take 1,000 Units by mouth daily.   Yes [provider]  COMBIGAN 0.2-0.5 % ophthalmic solution Place 1 drop into the right eye in the morning and at bedtime. 01/24/18  Yes [provider]  dorzolamide (TRUSOPT) 2 % ophthalmic solution  Place 1 drop into the right eye 2 (two) times daily.  01/03/18  Yes [provider]  ferrous sulfate 325 (65 FE) MG tablet Take 325 mg by mouth daily with breakfast.   Yes [provider]  lactose free nutrition (BOOST PLUS) LIQD Take 237 mLs by mouth daily with supper.   Yes [provider]  latanoprost (XALATAN) 0.005 % ophthalmic solution Place 1 drop into both eyes at bedtime.  01/24/18  Yes  [provider]  methocarbamol (ROBAXIN) 500 MG tablet Take 250 mg by mouth every 6 (six) hours as needed for muscle spasms.   Yes [provider]  Multiple Vitamins-Minerals (PRESERVISION AREDS 2 PO) Take 1 capsule by mouth in the morning and at bedtime.   Yes [provider]  olmesartan (BENICAR) 40 MG tablet Take 1 tablet (40 mg total) by mouth daily. 12/23/20  Yes Mariel Aloe, MD  polyethylene glycol (MIRALAX / GLYCOLAX) 17 g packet Take 17 g by mouth daily. In the morning between 8-11 am   Yes [provider]  traMADol (ULTRAM) 50 MG tablet Take 25 mg by mouth every 6 (six) hours as needed (pain).   Yes [provider]  HYDROcodone-acetaminophen (NORCO/VICODIN) 5-325 MG tablet Take 1 tablet by mouth at bedtime as needed for up to 14 days for moderate pain. Patient not taking: Reported on 01/11/2021 01/11/21 01/25/21  Royal Hawthorn, NP    No Known Allergies  Social History   Socioeconomic History   Marital status: Married    Spouse name: Shanon Brow   Number of children: Not on file   Years of education: Not on file   Highest education level: Not on file  Occupational History   Not on file  Tobacco Use   Smoking status: Former    Types: Cigarettes    Quit date: 1999    Years since quitting: 23.9   Smokeless tobacco: Never  Vaping Use   Vaping Use: Never used  Substance and Sexual Activity   Alcohol use: Yes    Alcohol/week: 1.0 standard drink    Types: 1 Glasses of wine per week    Comment: daily   Drug use: No   Sexual activity: Not on file  Other Topics Concern   Not on file  Social History Narrative   Lives with husband   Right Handed   Drinks caffeine rarely   Social Determinants of Health   Financial Resource Strain: Not on file  Food Insecurity: Not on file  Transportation Needs: Not on file  Physical Activity: Not on file  Stress: Not on file  Social Connections: Not on file  Intimate Partner Violence: Not on  file    Tobacco Use: Medium Risk   Smoking Tobacco Use: Former   Smokeless Tobacco Use: Never   Passive Exposure: Not on file   Social History   Substance and Sexual Activity  Alcohol Use Yes   Alcohol/week: 1.0 standard drink   Types: 1 Glasses of wine per week   Comment: daily    Family History  Problem Relation Age of Onset   Cancer Mother    Heart disease Father    Cancer Brother    Asthma Brother     Review of Systems  Constitutional:  Negative for chills and fever.  HENT:  Negative for congestion, sore throat and tinnitus.   Eyes:  Negative for double vision, photophobia and pain.  Respiratory:  Negative for cough, shortness of breath and wheezing.   Cardiovascular:  Negative for chest pain, palpitations and orthopnea.  Gastrointestinal:  Negative for heartburn, nausea and vomiting.  Genitourinary:  Negative for dysuria, frequency and urgency.  Musculoskeletal:  Positive for joint pain.  Neurological:  Negative for dizziness, weakness and headaches.   Objective:  Physical Exam: The patient is a pleasant, well-developed female alert and oriented in no apparent distress.   Left knee exam:  Patient has well-healed incisions, no erythema, no exudate, dry, and nontender.  There is no effusion.  Palpable gap between the patellar tendon and inferior pole of the patella.  The range of motion: 20 to 115 to 120 degrees actively, but can not do an active straight leg raise. Passively, she has full extension.  The knee is stable.   The patient's sensation and motor function are intact in their lower extremities. Their distal pulses are 2+. The bilateral calves are soft and nontender.  Vital signs in last 24 hours: Temp:  [98.1 F (36.7 C)] 98.1 F (36.7 C) (11/21 1349) Pulse Rate:  [88] 88 (11/21 1349) Resp:  [16] 16 (11/21 1349) BP: (133)/(70) 133/70 (11/21 1349) SpO2:  [98 %] 98 % (11/21 1349) Weight:  [57.7 kg] 57.7 kg (11/21  1358)   Assessment/Plan:  ASSESSMENT  Left total knee arthroplasty.       PLAN  We discussed the patient's presenting complaints, history, and treatment options. Unfortunately, she has a patellar tendon rupture. This is going to need surgical treatment. It is a very difficult problem post total knee arthroplasty. I told her that it will require her being in an immobilizer for at least 8 weeks before we start range of motion. This will set her back for her recovery, but she will be able to walk on it with the immobilizer on. Risks and benefits of the procedure were discussed with the patient and she elects to proceed.  Theresa Duty, PA-C Orthopedic Surgery [facility] [place]

## 2021-01-16 NOTE — Anesthesia Postprocedure Evaluation (Signed)
Anesthesia Post Note  Patient: Amy Carroll  Procedure(s) Performed: Left patellar tendon reconstruction (Left: Knee)     Patient location during evaluation: PACU Anesthesia Type: Spinal Level of consciousness: awake and alert Pain management: pain level controlled Vital Signs Assessment: post-procedure vital signs reviewed and stable Respiratory status: spontaneous breathing and respiratory function stable Cardiovascular status: blood pressure returned to baseline and stable Postop Assessment: spinal receding Anesthetic complications: no   No notable events documented.  Last Vitals:  Vitals:   01/16/21 1845 01/16/21 1900  BP: (!) 114/95 117/86  Pulse: 88 83  Resp: (!) 22 18  Temp:    SpO2: 97% 97%    Last Pain:  Vitals:   01/16/21 1900  TempSrc:   PainSc: 0-No pain                 Tiajuana Amass

## 2021-01-16 NOTE — Progress Notes (Signed)
Orthopedic Tech Progress Note Patient Details:  Amy Carroll 04-Sep-1934 [id number]  Ortho Devices Type of Ortho Device: Knee Immobilizer Ortho Device/Splint Location: left Ortho Device/Splint Interventions: Ordered, Other (comment) (For spare replacment)      Charline Bills Edy Belt 01/16/2021, 9:03 PM

## 2021-01-16 NOTE — Transfer of Care (Signed)
Immediate Anesthesia Transfer of Care Note  Patient: AJWA KIMBERLEY  Procedure(s) Performed: Left patellar tendon reconstruction (Left: Knee)  Patient Location: PACU  Anesthesia Type:Spinal  Level of Consciousness: awake, alert , oriented and patient cooperative  Airway & Oxygen Therapy: Patient Spontanous Breathing  Post-op Assessment: Report given to RN and Post -op Vital signs reviewed and stable  Post vital signs: Reviewed and stable  Last Vitals:  Vitals Value Taken Time  BP 103/63 1822  Temp    Pulse 89   Resp    SpO2 100%     Last Pain:  Vitals:   01/16/21 1358  TempSrc:   PainSc: 0-No pain      Patients Stated Pain Goal: 3 (67/01/10 0349)  Complications: No notable events documented.

## 2021-01-16 NOTE — Anesthesia Preprocedure Evaluation (Addendum)
Anesthesia Evaluation  Patient identified by MRN, date of birth, ID band Patient awake    Reviewed: Allergy & Precautions, NPO status , Patient's Chart, lab work & pertinent test results  Airway Mallampati: II  TM Distance: >3 FB Neck ROM: Full    Dental no notable dental hx. (+) Teeth Intact   Pulmonary former smoker,    Pulmonary exam normal breath sounds clear to auscultation       Cardiovascular hypertension, Pt. on medications Normal cardiovascular exam Rhythm:Regular Rate:Normal     Neuro/Psych negative neurological ROS  negative psych ROS   GI/Hepatic negative GI ROS, Neg liver ROS,   Endo/Other  negative endocrine ROS  Renal/GU negative Renal ROS     Musculoskeletal  (+) Arthritis , Osteoarthritis,    Abdominal Normal abdominal exam  (+)   Peds  Hematology negative hematology ROS (+) anemia ,   Anesthesia Other Findings   Reproductive/Obstetrics                             Lab Results  Component Value Date   WBC 5.3 12/29/2020   HGB 10.6 (A) 12/29/2020   HCT 31 (A) 12/29/2020   MCV 94.4 12/21/2020   PLT 267 12/29/2020   Lab Results  Component Value Date   CREATININE 0.7 01/02/2021   BUN 14 01/02/2021   NA 134 (A) 01/02/2021   K 4.5 01/02/2021   CL 100 01/02/2021   CO2 24 (A) 01/02/2021    Anesthesia Physical  Anesthesia Plan  ASA: 2  Anesthesia Plan: Spinal   Post-op Pain Management:  Regional for Post-op pain and Regional block and Ofirmev IV (intra-op)   Induction:   PONV Risk Score and Plan: 2 and Ondansetron, Dexamethasone, Propofol infusion and Treatment may vary due to age or medical condition  Airway Management Planned: Simple Face Mask, Nasal Cannula and Natural Airway  Additional Equipment: None  Intra-op Plan:   Post-operative Plan:   Informed Consent: I have reviewed the patients History and Physical, chart, labs and discussed the procedure  including the risks, benefits and alternatives for the proposed anesthesia with the patient or authorized representative who has indicated his/her understanding and acceptance.   Patient has DNR.  Discussed DNR with patient and Suspend DNR.   Dental advisory given  Plan Discussed with: CRNA  Anesthesia Plan Comments:        Anesthesia Quick Evaluation

## 2021-01-16 NOTE — Anesthesia Procedure Notes (Signed)
Anesthesia Regional Block: Adductor canal block   Pre-Anesthetic Checklist: , timeout performed,  Correct Patient, Correct Site, Correct Laterality,  Correct Procedure, Correct Position, site marked,  Risks and benefits discussed,  Surgical consent,  Pre-op evaluation,  At surgeon's request and post-op pain management  Laterality: Left  Prep: chloraprep       Needles:  Injection technique: Single-shot  Needle Type: Echogenic Needle     Needle Length: 9cm  Needle Gauge: 21     Additional Needles:   Procedures:,,,, ultrasound used (permanent image in chart),,    Narrative:  Start time: 01/16/2021 3:50 PM End time: 01/16/2021 3:56 PM Injection made incrementally with aspirations every 5 mL.  Performed by: Personally  Anesthesiologist: Suzette Battiest, MD

## 2021-01-16 NOTE — Progress Notes (Signed)
Assisted Dr. Suzette Battiest with Left Adductor Canal block. Side rails up, monitors on throughout procedure. See vital signs in flow sheet. Tolerated Procedure well.

## 2021-01-16 NOTE — Plan of Care (Signed)
Plan of care initiated and discussed with the patient and her husband.

## 2021-01-16 NOTE — Discharge Instructions (Signed)
Amy Arabian, MD [facility] [address] [place], [place] [address] [phone]  [place]  Keep a knee immobilizer on the knee at all times until Dr. Wynelle Link clears you to remove this Remove items at home which could result in a fall. This includes throw rugs or furniture in walking pathways.  ICE to the affected knee as much as tolerated. Icing helps control swelling. If the swelling is well controlled you will be more comfortable and rehab easier. Continue to use ice on the knee for pain and swelling from surgery. You may notice swelling that will progress down to the foot and ankle. This is normal after surgery. Elevate the leg when you are not up walking on it.    Continue to use the breathing machine which will help keep your temperature down. It is common for your temperature to cycle up and down following surgery, especially at night when you are not up moving around and exerting yourself. The breathing machine keeps your lungs expanded and your temperature down. Do not place pillow under the operative knee, focus on keeping the knee straight while resting  DIET You may resume your previous home diet once you are discharged from the hospital.  DRESSING / WOUND CARE / SHOWERING Keep your bulky bandage on for 2 days. On the third post-operative day you may remove the Ace bandage and gauze. There is a waterproof adhesive bandage on your skin which will stay in place until your first follow-up appointment. Once you remove this you will not need to place another bandage You may begin showering 3 days following surgery, but do not submerge the incision under water.  ACTIVITY Walk with your walker as instructed. Use the walker until you are comfortable transitioning to a cane. Walk with the cane in the opposite hand of the operative leg. You may discontinue the cane once you are comfortable and  walking steadily. Avoid periods of inactivity such as sitting longer than an hour when not asleep. This helps prevent blood clots.  You may discontinue the knee immobilizer once you are able to perform a straight leg raise while lying down. You may resume a sexual relationship in one month or when given the OK by your doctor.  You may return to work once you are cleared by your doctor.  Do not drive a car for 6 weeks or until released by your surgeon.  Do not drive while taking narcotics.  TED HOSE STOCKINGS Wear the elastic stockings on both legs for three weeks following surgery during the day. You may remove them at night for sleeping.  WEIGHT BEARING Weight bearing as tolerated with assist device (walker, cane, etc) as directed, use it as long as suggested by your surgeon or therapist, typically at least 4-6 weeks.  POSTOPERATIVE CONSTIPATION PROTOCOL Constipation - defined medically as fewer than three stools per week and severe constipation as less than one stool per week.  One of the most common issues patients have following surgery is constipation.  Even if you have a regular bowel pattern at home, your normal regimen is likely to be disrupted due to multiple reasons following surgery.  Combination of anesthesia, postoperative narcotics, change in appetite and fluid intake all can affect your bowels.  In order to avoid complications following surgery, here are some recommendations in order to help you during your recovery period.  Colace (docusate) - Pick up an over-the-counter form of Colace  or another stool softener and take twice a day as long as you are requiring postoperative pain medications.  Take with a full glass of water daily.  If you experience loose stools or diarrhea, hold the colace until you stool forms back up. If your symptoms do not get better within 1 week or if they get worse, check with your doctor. Dulcolax (bisacodyl) - Pick up over-the-counter and take as  directed by the product packaging as needed to assist with the movement of your bowels.  Take with a full glass of water.  Use this product as needed if not relieved by Colace only.  MiraLax (polyethylene glycol) - Pick up over-the-counter to have on hand. MiraLax is a solution that will increase the amount of water in your bowels to assist with bowel movements.  Take as directed and can mix with a glass of water, juice, soda, coffee, or tea. Take if you go more than two days without a movement. Do not use MiraLax more than once per day. Call your doctor if you are still constipated or irregular after using this medication for 7 days in a row.  If you continue to have problems with postoperative constipation, please contact the office for further assistance and recommendations.  If you experience "the worst abdominal pain ever" or develop nausea or vomiting, please contact the office immediatly for further recommendations for treatment.  ITCHING If you experience itching with your medications, try taking only a single pain pill, or even half a pain pill at a time.  You can also use Benadryl over the counter for itching or also to help with sleep.   MEDICATIONS See your medication summary on the "After Visit Summary" that the nursing staff will review with you prior to discharge.  You may have some home medications which will be placed on hold until you complete the course of blood thinner medication.  It is important for you to complete the blood thinner medication as prescribed by your surgeon.  Continue your approved medications as instructed at time of discharge.  PRECAUTIONS If you experience chest pain or shortness of breath - call 911 immediately for transfer to the hospital emergency department.  If you develop a fever greater that 101 F, purulent drainage from wound, increased redness or drainage from wound, foul odor from the wound/dressing, or calf pain - CONTACT YOUR SURGEON.                                                    FOLLOW-UP APPOINTMENTS Make sure you keep all of your appointments after your operation with your surgeon and caregivers. You should call the office at the above phone number and make an appointment for approximately two weeks after the date of your surgery or on the date instructed by your surgeon outlined in the "After Visit Summary".   IF YOU ARE TRANSFERRED TO A SKILLED REHAB FACILITY If the patient is transferred to a skilled rehab facility following release from the hospital, a list of the current medications will be sent to the facility for the patient to continue.  When discharged from the skilled rehab facility, please have the facility set up the patient's [place] prior to being released. Also, the skilled facility will be responsible for providing the patient with their medications at time of release  from the facility to include their pain medication, the muscle relaxants, and their blood thinner medication. If the patient is still at the rehab facility at time of the two week follow up appointment, the skilled rehab facility will also need to assist the patient in arranging follow up appointment in our office and any transportation needs.  MAKE SURE YOU:  Understand these instructions.  Get help right away if you are not doing well or get worse.   Pick up stool softner and laxative for home use following surgery while on pain medications. Do not submerge incision under water. Please use good hand washing techniques while changing dressing each day. May shower starting three days after surgery. Please use a clean towel to pat the incision dry following showers. Continue to use ice for pain and swelling after surgery. Do not use any lotions or creams on the incision until instructed by your surgeon.

## 2021-01-16 NOTE — Anesthesia Procedure Notes (Signed)
Spinal  Patient location during procedure: OR Start time: 01/16/2021 4:50 PM End time: 01/16/2021 4:58 PM Reason for block: surgical anesthesia Staffing Performed: anesthesiologist  Anesthesiologist: Suzette Battiest, MD Preanesthetic Checklist Completed: patient identified, IV checked, site marked, risks and benefits discussed, surgical consent, monitors and equipment checked, pre-op evaluation and timeout performed Spinal Block Patient position: sitting Prep: DuraPrep Patient monitoring: heart rate, cardiac monitor, continuous pulse ox and blood pressure Approach: midline Location: L4-5 Injection technique: single-shot Needle Needle type: Pencan  Needle gauge: 24 G Needle length: 9 cm Assessment Sensory level: T4 Events: CSF return

## 2021-01-16 NOTE — Brief Op Note (Signed)
01/16/2021  5:56 PM  PATIENT:  Wyline Beady  86 y.o. female  PRE-OPERATIVE DIAGNOSIS:  Left patella tendon rupture  POST-OPERATIVE DIAGNOSIS:  Left patella tendon rupture  PROCEDURE:  Procedure(s): Left patellar tendon reconstruction (Left)  SURGEON:  Surgeon(s) and Role:    Gaynelle Arabian, MD - Primary  PHYSICIAN ASSISTANT:   ASSISTANTS: Molli Barrows, PA-C   ANESTHESIA:   spinal  EBL:  10 ml  BLOOD ADMINISTERED:none  DRAINS: none   LOCAL MEDICATIONS USED:  NONE  COUNTS:  YES  TOURNIQUET:   Total Tourniquet Time Documented: Thigh (Left) - 33 minutes Total: Thigh (Left) - 33 minutes   DICTATION: .Other Dictation: Dictation Number [id number]  PLAN OF CARE: Admit for overnight observation  PATIENT DISPOSITION:  PACU - hemodynamically stable.

## 2021-01-17 ENCOUNTER — Encounter (HOSPITAL_COMMUNITY): Payer: Self-pay | Admitting: Orthopedic Surgery

## 2021-01-17 DIAGNOSIS — Z96652 Presence of left artificial knee joint: Secondary | ICD-10-CM | POA: Diagnosis present

## 2021-01-17 DIAGNOSIS — S76112A Strain of left quadriceps muscle, fascia and tendon, initial encounter: Secondary | ICD-10-CM | POA: Diagnosis present

## 2021-01-17 DIAGNOSIS — R7303 Prediabetes: Secondary | ICD-10-CM | POA: Diagnosis present

## 2021-01-17 DIAGNOSIS — Z8249 Family history of ischemic heart disease and other diseases of the circulatory system: Secondary | ICD-10-CM | POA: Diagnosis not present

## 2021-01-17 DIAGNOSIS — F0390 Unspecified dementia without behavioral disturbance: Secondary | ICD-10-CM | POA: Diagnosis present

## 2021-01-17 DIAGNOSIS — X58XXXA Exposure to other specified factors, initial encounter: Secondary | ICD-10-CM | POA: Diagnosis present

## 2021-01-17 DIAGNOSIS — I959 Hypotension, unspecified: Secondary | ICD-10-CM | POA: Diagnosis not present

## 2021-01-17 DIAGNOSIS — Z20822 Contact with and (suspected) exposure to covid-19: Secondary | ICD-10-CM | POA: Diagnosis present

## 2021-01-17 DIAGNOSIS — Z79899 Other long term (current) drug therapy: Secondary | ICD-10-CM | POA: Diagnosis not present

## 2021-01-17 DIAGNOSIS — M62562 Muscle wasting and atrophy, not elsewhere classified, left lower leg: Secondary | ICD-10-CM | POA: Diagnosis not present

## 2021-01-17 DIAGNOSIS — Z90710 Acquired absence of both cervix and uterus: Secondary | ICD-10-CM | POA: Diagnosis not present

## 2021-01-17 DIAGNOSIS — I10 Essential (primary) hypertension: Secondary | ICD-10-CM | POA: Diagnosis present

## 2021-01-17 DIAGNOSIS — M1712 Unilateral primary osteoarthritis, left knee: Secondary | ICD-10-CM | POA: Diagnosis not present

## 2021-01-17 DIAGNOSIS — R278 Other lack of coordination: Secondary | ICD-10-CM | POA: Diagnosis not present

## 2021-01-17 DIAGNOSIS — Z4789 Encounter for other orthopedic aftercare: Secondary | ICD-10-CM | POA: Diagnosis not present

## 2021-01-17 DIAGNOSIS — R410 Disorientation, unspecified: Secondary | ICD-10-CM | POA: Diagnosis not present

## 2021-01-17 DIAGNOSIS — Z471 Aftercare following joint replacement surgery: Secondary | ICD-10-CM | POA: Diagnosis not present

## 2021-01-17 DIAGNOSIS — Z7982 Long term (current) use of aspirin: Secondary | ICD-10-CM | POA: Diagnosis not present

## 2021-01-17 DIAGNOSIS — Z7401 Bed confinement status: Secondary | ICD-10-CM | POA: Diagnosis not present

## 2021-01-17 DIAGNOSIS — R2689 Other abnormalities of gait and mobility: Secondary | ICD-10-CM | POA: Diagnosis not present

## 2021-01-17 DIAGNOSIS — M1990 Unspecified osteoarthritis, unspecified site: Secondary | ICD-10-CM | POA: Diagnosis present

## 2021-01-17 DIAGNOSIS — Z9851 Tubal ligation status: Secondary | ICD-10-CM | POA: Diagnosis not present

## 2021-01-17 LAB — CBC
HCT: 28.4 % — ABNORMAL LOW (ref 36.0–46.0)
Hemoglobin: 9.4 g/dL — ABNORMAL LOW (ref 12.0–15.0)
MCH: 32.2 pg (ref 26.0–34.0)
MCHC: 33.1 g/dL (ref 30.0–36.0)
MCV: 97.3 fL (ref 80.0–100.0)
Platelets: 228 10*3/uL (ref 150–400)
RBC: 2.92 MIL/uL — ABNORMAL LOW (ref 3.87–5.11)
RDW: 14.6 % (ref 11.5–15.5)
WBC: 6.5 10*3/uL (ref 4.0–10.5)
nRBC: 0 % (ref 0.0–0.2)

## 2021-01-17 LAB — BASIC METABOLIC PANEL
Anion gap: 5 (ref 5–15)
BUN: 9 mg/dL (ref 8–23)
CO2: 28 mmol/L (ref 22–32)
Calcium: 8.2 mg/dL — ABNORMAL LOW (ref 8.9–10.3)
Chloride: 103 mmol/L (ref 98–111)
Creatinine, Ser: 0.5 mg/dL (ref 0.44–1.00)
GFR, Estimated: >60 mL/min (ref >60)
Glucose, Bld: 90 mg/dL (ref 70–99)
Potassium: 3.5 mmol/L (ref 3.5–5.1)
Sodium: 136 mmol/L (ref 135–145)

## 2021-01-17 MED ORDER — TRAMADOL HCL 50 MG PO TABS: 25.0000 mg | ORAL_TABLET | Freq: Four times a day (QID) | ORAL | 0 refills | Status: DC | PRN

## 2021-01-17 NOTE — Plan of Care (Signed)
  Problem: Education: Goal: Knowledge of General Education information will improve Description: Including pain rating scale, medication(s)/side effects and non-pharmacologic comfort measures Outcome: Progressing   Problem: Activity: Goal: Risk for activity intolerance will decrease Outcome: Progressing   Problem: Pain Managment: Goal: General experience of comfort will improve Outcome: Progressing   

## 2021-01-17 NOTE — TOC Initial Note (Signed)
Transition of Care [facility]) - Initial/Assessment Note   Patient Details  Name: Amy Carroll MRN: [id number] Date of Birth: 28-Dec-1934  [facility] CM/SW Contact:    Sherie Don, LCSW Phone Number: 01/17/2021, 1:27 PM  Clinical Narrative: Patient is a resident of [facility] and the discharge plan is for her to return for SNF. FL2 done; PASRR verified. CSW spoke with Butch Penny at [facility] and confirmed the patient can be admitted tomorrow pending negative COVID test, signed FL2, and discharge summary. TOC to follow.  Expected Discharge Plan: Skilled Nursing Facility Barriers to Discharge: No Barriers Identified  Patient Goals and CMS Choice Patient states their goals for this hospitalization and ongoing recovery are:: Discharge to rehab at [facility].gov Compare Post Acute Care list provided to:: Patient Choice offered to / list presented to : Patient  Expected Discharge Plan and Services Expected Discharge Plan: [place] In-house Referral: Clinical Social Work Post Acute Care Choice: [place] Living arrangements for the past 2 months: [place] Expected Discharge Date: 01/17/21               DME Arranged: N/A DME Agency: NA  Prior Living Arrangements/Services Living arrangements for the past 2 months: [place] Lives with:: Spouse Patient language and need for interpreter reviewed:: Yes Do you feel safe going back to the place where you live?: Yes      Need for Family Participation in Patient Care: No (Comment) Care giver support system in place?: Yes (comment) Criminal Activity/Legal Involvement Pertinent to Current Situation/Hospitalization: No - Comment as needed  Activities of Daily Living Home Assistive Devices/Equipment: Environmental consultant (specify type), Eyeglasses ADL Screening (condition at time of admission) Patient's cognitive ability adequate to safely complete daily activities?:  Yes Is the patient deaf or have difficulty hearing?: Yes Does the patient have difficulty seeing, even when wearing glasses/contacts?: No Does the patient have difficulty concentrating, remembering, or making decisions?: No Patient able to express need for assistance with ADLs?: Yes Does the patient have difficulty dressing or bathing?: Yes Independently performs ADLs?: Yes (appropriate for developmental age) Does the patient have difficulty walking or climbing stairs?: Yes Weakness of Legs: Both Weakness of Arms/Hands: None  Permission Sought/Granted Permission sought to share information with : Facility Art therapist granted to share info w AGENCY: [facility]  Emotional Assessment Appearance:: Appears stated age Attitude/Demeanor/Rapport: Engaged Affect (typically observed): Accepting Orientation: : Oriented to Self, Oriented to Place, Oriented to  Time, Oriented to Situation Alcohol / Substance Use: Not Applicable Psych Involvement: No (comment)  Admission diagnosis:  Patellar tendon rupture, left, initial encounter [X79.390Z] Patient Active Problem List   Diagnosis Date Noted   Patellar tendon rupture, left, subsequent encounter 01/16/2021   Patellar tendon rupture, left, initial encounter 01/16/2021   Hyponatremia 12/21/2020   Short-term memory loss 12/15/2020   Essential hypertension 12/15/2020   Primary osteoarthritis of left knee 12/12/2020   Iron deficiency anemia 12/02/2020   Gait abnormality 06/20/2020   OA (osteoarthritis) of hip 02/18/2019   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   Allergic contact dermatitis due to metals 03/17/2018   Presence of right artificial knee joint 04/02/2016   Effusion, right knee 04/02/2016   Osteoarthritis of right knee 05/27/2012    Class: Diagnosis of   PCP:  Merrilee Seashore, MD Pharmacy:   [facility] ([place], [place] [phone]  [place] [address] [place] [address] Phone: [phone] Fax: [phone]  [id number] - [place], [place] [address] Phone: [phone] Fax: [place], [place] - [place] [address] Phone: [phone] Fax: [phone]  Readmission Risk Interventions No flowsheet data found.

## 2021-01-17 NOTE — Op Note (Signed)
Amy Carroll, [place]ESE MEDICAL RECORD NO: [id number] ACCOUNT NO: [id number] DATE OF BIRTH: 21-Oct-1934 FACILITY: [facility]ATION: [id number] PHYSICIAN: Gus Rankin. [name], MD  Operative Report   DATE OF PROCEDURE: 01/16/2021  PREOPERATIVE DIAGNOSIS:  Left patellar tendon rupture.  POSTOPERATIVE DIAGNOSIS:  Left patellar tendon rupture.  PROCEDURE:  Left patellar tendon repair.  SURGEON:  Ollen Gross, MD  ASSISTANT:  Jodene Nam, PA-C  ANESTHESIA:  Spinal.  ESTIMATED BLOOD LOSS:  Minimal.  DRAIN: None.  TOURNIQUET TIME:  33 minutes at 300 mmHg.  COMPLICATIONS:  None.  CONDITION:  Stable to recovery.  BRIEF CLINICAL NOTE:  The patient is an 86 year old female who had a left total knee arthroplasty done about 4-6 weeks ago.  She was doing physical therapy and noted a pop with significant increased pain.  The concern was for possible patellar tendon  rupture.  She lost the ability to do a straight leg raise or fully extend her knee.  Exam and history were consistent with patellar tendon rupture.  She presents now for repair.  DESCRIPTION OF PROCEDURE:  After successful administration of spinal anesthetic, a tourniquet was placed on her left thigh and the left lower extremity prepped and draped in the usual sterile fashion.  Extremity was wrapped in Esmarch, tourniquet  inflated to 250 mmHg.  Midline incision was made with a 10 blade through subcutaneous tissue.  There was an obvious defect at the inferior pole of the patella.  It extended out into the medial and lateral retinaculum also.  Thorough dissection was  performed to expose the edges of the retinacular tear.  We then identified the inferior and superior fragments.  There was a tiny bony fragment inferiorly, which was excised.  Interestingly, on the medial aspect, the patellar tendon had actually pulled  off the tibial tubercle, but the middle and lateral two-thirds of the tendon had pulled off of the patella.  We then placed  2 FiberWire sutures starting at the tibial tubercle and the patellar tendon and weaved them through in a Bunnell type weave.  I  drilled 3 holes in the patella and we passed the sutures through the holes from inferior to superior.  We then also placed a Mitek anchor medially adjacent to the tubercle to allow for repair of the patellar tendon that had avulsed off that area.  This  was about one-third of the medial aspect of the tendon and the other two-thirds were attached distally, but pulled off proximally off the patella.  We reduced the patella back to its normal position and then tied the sutures superiorly, which held in an  apposition.  I then placed a Mitek suture anchor into the tibia and took those sutures and secured down the distal aspect of the patellar tendon medially and then oversewed with overlying tissue.  We then repaired the retinacular tear with interrupted  Ethibond sutures.  I was very satisfied with the repair and felt it was very stable.  Flexion against gravity went down to 60 degrees and there was no evidence of any gapping at the repair line.  Tourniquet was then released.  Total time 33 minutes.   Minor bleeding stopped with cautery.  Thorough irrigation was performed prior to this.  Subcutaneous was then closed with interrupted 2-0 Vicryl and subcuticular running 4-0 Monocryl.  The incision was cleaned and dried and Steri-Strips and a bulky  sterile dressing applied.  She was placed into a knee immobilizer, awakened, and transported to recovery in stable condition.  Note  that a surgical assistant was of medical necessity for this procedure.  Surgical assistant was necessary for reduction of patella back to its normal position and holding it reduced while the repair was being performed.   SHW D: 01/16/2021 6:02:19 pm T: 01/17/2021 12:29:00 am  JOB: [id number]/ [id number]

## 2021-01-17 NOTE — Progress Notes (Signed)
   Subjective: 1 Day Post-Op Procedure(s) (LRB): Left patellar tendon reconstruction (Left) Patient reports pain as mild.   Patient seen in rounds by Dr. Wynelle Link. Patient had issues with pain last night, doing better this AM. Had confusion and was unable to tolerate low dose hydrocodone with TKA. Only tylenol and tramadol currently ordered.  We will begin therapy today.   Objective: Vital signs in last 24 hours: Temp:  [97.6 F (36.4 C)-98.2 F (36.8 C)] 98.2 F (36.8 C) (11/22 0539) Pulse Rate:  [74-95] 86 (11/22 0539) Resp:  [13-25] 16 (11/22 0539) BP: (103-144)/(53-95) 130/68 (11/22 0539) SpO2:  [95 %-100 %] 98 % (11/22 0539) Weight:  [57.7 kg] 57.7 kg (11/21 1358)  Intake/Output from previous day:  Intake/Output Summary (Last 24 hours) at 01/17/2021 0835 Last data filed at 01/17/2021 0700 Gross per 24 hour  Intake 2483.17 ml  Output 2515 ml  Net -31.83 ml     Intake/Output this shift: No intake/output data recorded.  Labs: Recent Labs    01/17/21 0310  HGB 9.4*   Recent Labs    01/17/21 0310  WBC 6.5  RBC 2.92*  HCT 28.4*  PLT 228   Recent Labs    01/17/21 0310  NA 136  K 3.5  CL 103  CO2 28  BUN 9  CREATININE 0.50  GLUCOSE 90  CALCIUM 8.2*   No results for input(s): LABPT, INR in the last 72 hours.  Exam: General - Patient is Alert and Oriented Extremity - Neurologically intact Neurovascular intact Sensation intact distally Dorsiflexion/Plantar flexion intact Dressing - dressing C/D/I Motor Function - intact, moving foot and toes well on exam.   Past Medical History:  Diagnosis Date   Dementia ([place])    Gait abnormality 06/20/2020   Hypertension    Hyponatremia    Iron deficiency anemia    Major depression    OA (osteoarthritis)    Pre-diabetes    Vitamin B 12 deficiency     Assessment/Plan: 1 Day Post-Op Procedure(s) (LRB): Left patellar tendon reconstruction (Left) Principal Problem:   Patellar tendon rupture, left,  subsequent encounter Active Problems:   Patellar tendon rupture, left, initial encounter  Estimated body mass index is 22.55 kg/m as calculated from the following:   Height as of this encounter: 5\' 3"  (1.6 m).   Weight as of this encounter: 57.7 kg. Advance diet Up with therapy D/C IV fluids  DVT Prophylaxis - Aspirin Weight bearing as tolerated. Begin therapy.  No ROM to the right knee, to stay in knee immobilizer at all times.   Plan is to go Home after hospital stay. Discharge to [facility] once ready from a social work standpoint if pain controlled. Follow-up in the office in 2 weeks.  The PDMP database was reviewed today prior to any opioid medications being prescribed to this patient.  Theresa Duty, PA-C Orthopedic Surgery [phone] 01/17/2021, 8:35 AM

## 2021-01-17 NOTE — NC FL2 (Signed)
[place] LEVEL OF CARE SCREENING TOOL     IDENTIFICATION  Patient Name: Amy Carroll Birthdate: 12-Apr-1934 Sex: female Admission Date (Current Location): 01/16/2021  [facility] and [place] Number:  Herbalist and Address:  [facility],  [address], [place]      Provider Number: [id number]  Attending Physician Name and Address:  Gaynelle Arabian, MD  Relative Name and Phone Number:  Caitriona Sundquist (spouse) Ph: [phone]    Current Level of Care: Hospital Recommended Level of Care: [place] Prior Approval Number:    Date Approved/Denied:   PASRR Number: [id number]  Discharge Plan: SNF    Current Diagnoses: Patient Active Problem List   Diagnosis Date Noted   Patellar tendon rupture, left, subsequent encounter 01/16/2021   Patellar tendon rupture, left, initial encounter 01/16/2021   Hyponatremia 12/21/2020   Short-term memory loss 12/15/2020   Essential hypertension 12/15/2020   Primary osteoarthritis of left knee 12/12/2020   Iron deficiency anemia 12/02/2020   Gait abnormality 06/20/2020   OA (osteoarthritis) of hip 02/18/2019   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   Allergic contact dermatitis due to metals 03/17/2018   Presence of right artificial knee joint 04/02/2016   Effusion, right knee 04/02/2016   Osteoarthritis of right knee 05/27/2012    Orientation RESPIRATION BLADDER Height & Weight     Self, Time, Situation, Place  Normal Continent Weight: 127 lb 4.8 oz (57.7 kg) Height:  5\' 3"  (160 cm)  BEHAVIORAL SYMPTOMS/MOOD NEUROLOGICAL BOWEL NUTRITION STATUS      Continent Diet  AMBULATORY STATUS COMMUNICATION OF NEEDS Skin   Limited Assist Verbally Surgical wounds                       Personal Care Assistance Level of Assistance  Bathing, Feeding, Dressing Bathing Assistance: Limited assistance Feeding  assistance: Independent Dressing Assistance: Limited assistance     Functional Limitations Info  Sight, Hearing, Speech Sight Info: Impaired Hearing Info: Impaired Speech Info: Adequate    SPECIAL CARE FACTORS FREQUENCY  PT (By licensed PT), OT (By licensed OT)     PT Frequency: 5x's/week OT Frequency: 5x's/week            Contractures Contractures Info: Not present    Additional Factors Info  Code Status, Allergies Code Status Info: Full Allergies Info: NKA           Current Medications (01/17/2021):  This is the current hospital active medication list Current Facility-Administered Medications  Medication Dose Route Frequency Provider Last Rate Last Admin   0.9 %  sodium chloride infusion   Intravenous Continuous Derl Barrow, PA 75 mL/hr at 01/16/21 2208 New Bag at 01/16/21 2208   amLODipine (NORVASC) tablet 5 mg  5 mg Oral q AM Edmisten, Kristie L, PA   5 mg at 01/17/21 4650   aspirin EC tablet 81 mg  81 mg Oral q AM Edmisten, Kristie L, PA   81 mg at 01/17/21 0853   bisacodyl (DULCOLAX) suppository 10 mg  10 mg Rectal Daily PRN Edmisten, Kristie L, PA       diphenhydrAMINE (BENADRYL) 12.5 MG/5ML elixir 12.5-25 mg  12.5-25 mg Oral Q4H PRN Edmisten, Kristie L, PA       docusate sodium (COLACE) capsule 100 mg  100 mg Oral BID Edmisten, Kristie L, PA   100 mg at 01/17/21 0853   ferrous sulfate  tablet 325 mg  325 mg Oral Q breakfast Edmisten, Kristie L, PA   325 mg at 01/17/21 5056   irbesartan (AVAPRO) tablet 300 mg  300 mg Oral Daily Edmisten, Kristie L, PA   300 mg at 01/17/21 9794   menthol-cetylpyridinium (CEPACOL) lozenge 3 mg  1 lozenge Oral PRN Edmisten, Kristie L, PA       Or   phenol (CHLORASEPTIC) mouth spray 1 spray  1 spray Mouth/Throat PRN Edmisten, Kristie L, PA       methocarbamol (ROBAXIN) tablet 250 mg  250 mg Oral Q6H PRN Edmisten, Kristie L, PA       metoCLOPramide (REGLAN) tablet 5-10 mg  5-10 mg Oral Q8H PRN Edmisten, Kristie L, PA       Or    metoCLOPramide (REGLAN) injection 5-10 mg  5-10 mg Intravenous Q8H PRN Edmisten, Kristie L, PA       morphine 2 MG/ML injection 0.5-1 mg  0.5-1 mg Intravenous Q2H PRN Edmisten, Kristie L, PA       ondansetron (ZOFRAN) tablet 4 mg  4 mg Oral Q6H PRN Edmisten, Kristie L, PA       Or   ondansetron (ZOFRAN) injection 4 mg  4 mg Intravenous Q6H PRN Edmisten, Kristie L, PA       polyethylene glycol (MIRALAX / GLYCOLAX) packet 17 g  17 g Oral Daily PRN Edmisten, Kristie L, PA       sodium phosphate (FLEET) 7-19 GM/118ML enema 1 enema  1 enema Rectal Once PRN Edmisten, Kristie L, PA       traMADol (ULTRAM) tablet 25 mg  25 mg Oral Q6H PRN Edmisten, Kristie L, PA   25 mg at 01/17/21 8016     Discharge Medications: Please see discharge summary for a list of discharge medications.  Relevant Imaging Results:  Relevant Lab Results:   Additional Information SSN: [id number]  Sherie Don, LCSW

## 2021-01-17 NOTE — Evaluation (Signed)
Physical Therapy Evaluation Patient Details Name: Amy Carroll MRN: [id number] DOB: [birth year] Today's Date: 01/17/2021  History of Present Illness  86 y.o. female with medical history significant for hypertension, Rt TKA and revision, Rt THA, recently s/p left TKA on 12/12/2020, readmission on 10/26 for confusion and hyponatremia.  Pt had fall at her facility and increased pain and swelling in left knee, found to have left patellar tendon rupture.   Pt s/p left patellar tendon reconstrution on 01/16/21.  Clinical Impression  Patient is s/p above surgery resulting in functional limitations due to the deficits listed below (see PT Problem List). Patient will benefit from skilled PT to increase their independence and safety with mobility to allow discharge to the venue listed below.  Pt reports she was still in rehab prior to this admission.  Pt educated on knee precautions and needing to use KI at this time.  Pt requiring mod assist for transfers at this time.  Recommend continuing rehab at facility upon d/c.       Recommendations for follow up therapy are one component of a multi-disciplinary discharge planning process, led by the attending physician.  Recommendations may be updated based on patient status, additional functional criteria and insurance authorization.  Follow Up Recommendations Follow physician's recommendations for discharge plan and follow up therapies    Assistance Recommended at Discharge Frequent or constant Supervision/Assistance  Functional Status Assessment Patient has had a recent decline in their functional status and demonstrates the ability to make significant improvements in function in a reasonable and predictable amount of time.  Equipment Recommendations  None recommended by PT    Recommendations for Other Services       Precautions / Restrictions Precautions Precautions: Fall;Knee Required Braces or Orthoses: Knee Immobilizer - Left Knee Immobilizer -  Left: On at all times Restrictions Weight Bearing Restrictions: No LLE Weight Bearing: Weight bearing as tolerated      Mobility  Bed Mobility Overal bed mobility: Needs Assistance Bed Mobility: Supine to Sit     Supine to sit: Min assist     General bed mobility comments: assist for L LE, increased time    Transfers Overall transfer level: Needs assistance Equipment used: Rolling walker (2 wheels) Transfers: Sit to/from Stand;Bed to chair/wheelchair/BSC Sit to Stand: Mod assist   Step pivot transfers: Min assist       General transfer comment: multimodal cues for hand placement and LE positioning; pt reports weakness in right LE as well and had difficulty lifting for step and performed by scooting foot along floor    Ambulation/Gait                  Stairs            Wheelchair Mobility    Modified Rankin (Stroke Patients Only)       Balance Overall balance assessment: Needs assistance         Standing balance support: Bilateral upper extremity supported;During functional activity;Reliant on assistive device for balance Standing balance-Leahy Scale: Poor                               Pertinent Vitals/Pain Pain Assessment: Faces Faces Pain Scale: Hurts little more Pain Location: L knee Pain Descriptors / Indicators: Sore;Tender Pain Intervention(s): Repositioned;Monitored during session;Ice applied    Home Living Family/patient expects to be discharged to:: Skilled nursing facility Living Arrangements: Spouse/significant other Available Help at Discharge: Family Type of Home:  House (home at [facility]) Home Access: Level entry       [place]: One [place]: Conservation officer, nature (2 wheels) Additional Comments: ILF prior to L TKA, pt has been in rehab prior to this admission    Prior Function Prior Level of Function : Needs assist             Mobility Comments: pt recently s/p L TKA and discharged to rehab  section of facility, pt reports still remaining in rehab section prior to this admission       Hand Dominance        Extremity/Trunk Assessment        Lower Extremity Assessment Lower Extremity Assessment: LLE deficits/detail;Generalized weakness LLE Deficits / Details: maintained Left knee extension, KI; pt able to perform ankle pumps, educated on absolutely NO knee flexion       Communication   Communication: No difficulties  Cognition Arousal/Alertness: Awake/alert Behavior During Therapy: WFL for tasks assessed/performed                                   General Comments: hx dementia, memory loss; requires cues for technique and precautions        General Comments      Exercises     Assessment/Plan    PT Assessment Patient needs continued PT services  PT Problem List Decreased range of motion;Decreased strength;Decreased activity tolerance;Decreased balance;Decreased mobility;Decreased knowledge of use of DME;Decreased knowledge of precautions       PT Treatment Interventions Gait training;DME instruction;Stair training;Functional mobility training;Balance training;Therapeutic exercise;Therapeutic activities;Patient/family education    PT Goals (Current goals can be found in the Care Plan section)  Acute Rehab PT Goals PT Goal Formulation: With patient Time For Goal Achievement: 01/24/21 Potential to Achieve Goals: Good    Frequency 7X/week   Barriers to discharge        Co-evaluation               AM-PAC PT "6 Clicks" Mobility  Outcome Measure Help needed turning from your back to your side while in a flat bed without using bedrails?: A Little Help needed moving from lying on your back to sitting on the side of a flat bed without using bedrails?: A Little Help needed moving to and from a bed to a chair (including a wheelchair)?: A Lot Help needed standing up from a chair using your arms (e.g., wheelchair or bedside chair)?: A  Lot Help needed to walk in hospital room?: A Lot Help needed climbing 3-5 steps with a railing? : Total 6 Click Score: 13    End of Session Equipment Utilized During Treatment: Gait belt;Left knee immobilizer Activity Tolerance: Patient tolerated treatment well Patient left: in chair;with call bell/phone within reach;with chair alarm set;with family/visitor present Nurse Communication: Mobility status PT Visit Diagnosis: Muscle weakness (generalized) (M62.81);Difficulty in walking, not elsewhere classified (R26.2)    Time: 7341-9379 PT Time Calculation (min) (ACUTE ONLY): 20 min   Charges:   PT Evaluation $PT Eval Low Complexity: 1 Low        Kati PT, DPT Acute Rehabilitation Services Pager: [phone] Office: [place] 01/17/2021, 11:57 AM

## 2021-01-17 NOTE — Progress Notes (Signed)
Physical Therapy Treatment Patient Details Name: Amy Carroll MRN: [id number] DOB: May 28, 1934 Today's Date: 01/17/2021   History of Present Illness 86 y.o. female with medical history significant for hypertension, Rt TKA and revision, Rt THA, recently s/p left TKA on 12/12/2020, readmission on 10/26 for confusion and hyponatremia.  Pt had fall at her facility and increased pain and swelling in left knee, found to have left patellar tendon rupture.   Pt s/p left patellar tendon reconstrution on 01/16/21.    PT Comments    Pt assisted to [facility] and then ambulated 5 ft along EOB for safety.  Pt reports increased pain limiting activity this afternoon.  Pt anticipates d/c back to facility tomorrow.    Recommendations for follow up therapy are one component of a multi-disciplinary discharge planning process, led by the attending physician.  Recommendations may be updated based on patient status, additional functional criteria and insurance authorization.  Follow Up Recommendations  Follow physician's recommendations for discharge plan and follow up therapies     Assistance Recommended at Discharge Frequent or constant Supervision/Assistance  Equipment Recommendations  None recommended by PT    Recommendations for Other Services       Precautions / Restrictions Precautions Precautions: Fall;Knee Precaution Comments: reviewed KI on at all times and no knee flexion Required Braces or Orthoses: Knee Immobilizer - Left Knee Immobilizer - Left: On at all times Restrictions LLE Weight Bearing: Weight bearing as tolerated     Mobility  Bed Mobility Overal bed mobility: Needs Assistance Bed Mobility: Supine to Sit;Sit to Supine     Supine to sit: Min assist Sit to supine: Min assist   General bed mobility comments: assist for L LE, increased time    Transfers Overall transfer level: Needs assistance Equipment used: Rolling walker (2 wheels) Transfers: Sit to/from Stand;Bed to  chair/wheelchair/BSC Sit to Stand: Min assist     Step pivot transfers: Min assist     General transfer comment: multimodal cues for hand placement and LE positioning; pt requesting BSC    Ambulation/Gait Ambulation/Gait assistance: Min assist Gait Distance (Feet): 4 Feet Assistive device: Rolling walker (2 wheels) Gait Pattern/deviations: Step-to pattern;Decreased stance time - left;Antalgic;Decreased weight shift to left       General Gait Details: pt better able to advance R LE this afternoon however reports increased pain so only ambulated several feet along EOB for safety   Stairs             Wheelchair Mobility    Modified Rankin (Stroke Patients Only)       Balance Overall balance assessment: Needs assistance         Standing balance support: Bilateral upper extremity supported;During functional activity;Reliant on assistive device for balance Standing balance-Leahy Scale: Poor                              Cognition Arousal/Alertness: Awake/alert Behavior During Therapy: WFL for tasks assessed/performed                                   General Comments: hx dementia, memory loss; requires cues for technique and precautions        Exercises      General Comments        Pertinent Vitals/Pain Pain Assessment: Faces Faces Pain Scale: Hurts even more Pain Location: L knee Pain Descriptors / Indicators: Sore;Tender Pain  Intervention(s): Repositioned;Monitored during session;Patient requesting pain meds-RN notified    Home Living Family/patient expects to be discharged to:: Skilled nursing facility Living Arrangements: Spouse/significant other Available Help at Discharge: Family Type of Home: House (home at [facility]) Home Access: Level entry       Home Layout: One level Home Equipment: Conservation officer, nature (2 wheels) Additional Comments: ILF prior to L TKA, pt has been in rehab prior to this admission    Prior  Function            PT Goals (current goals can now be found in the care plan section) Acute Rehab PT Goals PT Goal Formulation: With patient Time For Goal Achievement: 01/24/21 Potential to Achieve Goals: Good Progress towards PT goals: Progressing toward goals    Frequency    7X/week      PT Plan Current plan remains appropriate    Co-evaluation              AM-PAC PT "6 Clicks" Mobility   Outcome Measure  Help needed turning from your back to your side while in a flat bed without using bedrails?: A Little Help needed moving from lying on your back to sitting on the side of a flat bed without using bedrails?: A Little Help needed moving to and from a bed to a chair (including a wheelchair)?: A Lot Help needed standing up from a chair using your arms (e.g., wheelchair or bedside chair)?: A Lot Help needed to walk in hospital room?: A Lot Help needed climbing 3-5 steps with a railing? : Total 6 Click Score: 13    End of Session Equipment Utilized During Treatment: Gait belt;Left knee immobilizer Activity Tolerance: Patient tolerated treatment well Patient left: with call bell/phone within reach;with family/visitor present;in bed Nurse Communication: Mobility status PT Visit Diagnosis: Muscle weakness (generalized) (M62.81);Difficulty in walking, not elsewhere classified (R26.2)     Time: 9767-3419 PT Time Calculation (min) (ACUTE ONLY): 16 min  Charges:  $Therapeutic Activity: 8-22 mins                     Jannette Spanner PT, DPT Acute Rehabilitation Services Pager: [phone] Office: [place] 01/17/2021, 3:38 PM

## 2021-01-18 DIAGNOSIS — Z4789 Encounter for other orthopedic aftercare: Secondary | ICD-10-CM | POA: Diagnosis not present

## 2021-01-18 DIAGNOSIS — R2689 Other abnormalities of gait and mobility: Secondary | ICD-10-CM | POA: Diagnosis not present

## 2021-01-18 DIAGNOSIS — M1712 Unilateral primary osteoarthritis, left knee: Secondary | ICD-10-CM | POA: Diagnosis not present

## 2021-01-18 DIAGNOSIS — Z471 Aftercare following joint replacement surgery: Secondary | ICD-10-CM | POA: Diagnosis not present

## 2021-01-18 DIAGNOSIS — R278 Other lack of coordination: Secondary | ICD-10-CM | POA: Diagnosis not present

## 2021-01-18 DIAGNOSIS — M62562 Muscle wasting and atrophy, not elsewhere classified, left lower leg: Secondary | ICD-10-CM | POA: Diagnosis not present

## 2021-01-18 LAB — BASIC METABOLIC PANEL
Anion gap: 5 (ref 5–15)
BUN: 10 mg/dL (ref 8–23)
CO2: 27 mmol/L (ref 22–32)
Calcium: 8.2 mg/dL — ABNORMAL LOW (ref 8.9–10.3)
Chloride: 100 mmol/L (ref 98–111)
Creatinine, Ser: 0.51 mg/dL (ref 0.44–1.00)
GFR, Estimated: >60 mL/min (ref >60)
Glucose, Bld: 103 mg/dL — ABNORMAL HIGH (ref 70–99)
Potassium: 3.3 mmol/L — ABNORMAL LOW (ref 3.5–5.1)
Sodium: 132 mmol/L — ABNORMAL LOW (ref 135–145)

## 2021-01-18 LAB — CBC
HCT: 25.6 % — ABNORMAL LOW (ref 36.0–46.0)
Hemoglobin: 8.3 g/dL — ABNORMAL LOW (ref 12.0–15.0)
MCH: 31.4 pg (ref 26.0–34.0)
MCHC: 32.4 g/dL (ref 30.0–36.0)
MCV: 97 fL (ref 80.0–100.0)
Platelets: 214 10*3/uL (ref 150–400)
RBC: 2.64 MIL/uL — ABNORMAL LOW (ref 3.87–5.11)
RDW: 14.6 % (ref 11.5–15.5)
WBC: 6.5 10*3/uL (ref 4.0–10.5)
nRBC: 0 % (ref 0.0–0.2)

## 2021-01-18 LAB — RESP PANEL BY RT-PCR (FLU A&B, COVID) ARPGX2
Influenza A by PCR: NEGATIVE
Influenza B by PCR: NEGATIVE
SARS Coronavirus 2 by RT PCR: NEGATIVE

## 2021-01-18 NOTE — Plan of Care (Signed)
  Problem: Clinical Measurements: Goal: Ability to maintain clinical measurements within normal limits will improve Outcome: Progressing   Problem: Activity: Goal: Risk for activity intolerance will decrease Outcome: Progressing   Problem: Pain Managment: Goal: General experience of comfort will improve Outcome: Progressing   

## 2021-01-18 NOTE — TOC Transition Note (Signed)
Transition of Care [facility]) - CM/SW Discharge Note  Patient Details  Name: Amy Carroll MRN: [id number] Date of Birth: 1934-04-22  [facility]) CM/SW Contact:  Sherie Don, LCSW Phone Number: 01/18/2021, 11:17 AM  Clinical Narrative: Patient can discharge to [facility] with a pending COVID test. Discharge summary, discharge orders, FL2, and SNF transfer report sent to facility in hub. Patient will need to discharge via [place] per Butch Penny with [facility]. Medical necessity form done; PTAR scheduled. Discharge packet completed. RN and husband updated. TOC signing off.  Final next level of care: [place] Barriers to Discharge: No Barriers Identified  Patient Goals and CMS Choice Patient states their goals for this hospitalization and ongoing recovery are:: Discharge to rehab at [facility].gov Compare Post Acute Care list provided to:: Patient Choice offered to / list presented to : Patient  Discharge Placement Existing PASRR number confirmed : 01/17/21          Patient chooses bed at: [facility] Patient to be transferred to facility by: [place] Name of family member notified: Amandeep Hogston (husband) Patient and family notified of of transfer: 01/18/21  Discharge Plan and Services In-house Referral: Clinical Social Work Post Acute Care Choice: [place]          DME Arranged: N/A DME Agency: NA  Readmission Risk Interventions No flowsheet data found.

## 2021-01-18 NOTE — Progress Notes (Signed)
Physical Therapy Treatment Patient Details Name: CAMARIE MCTIGUE MRN: [id number] DOB: 02/13/1935 Today's Date: 01/18/2021   History of Present Illness 86 y.o. female with medical history significant for hypertension, Rt TKA and revision, Rt THA, recently s/p left TKA on 12/12/2020, readmission on 10/26 for confusion and hyponatremia.  Pt had fall at her facility and increased pain and swelling in left knee, found to have left patellar tendon rupture.   Pt s/p left patellar tendon reconstrution on 01/16/21.    PT Comments    Pt assisted with repositioning KI to appropriate place/fit prior to mobilizing.  Pt able to tolerate short distance ambulation today however continues to require at least min assist.  Pt anticipates d/c to SNF today.    Recommendations for follow up therapy are one component of a multi-disciplinary discharge planning process, led by the attending physician.  Recommendations may be updated based on patient status, additional functional criteria and insurance authorization.  Follow Up Recommendations  Follow physician's recommendations for discharge plan and follow up therapies     Assistance Recommended at Discharge Frequent or constant Supervision/Assistance  Equipment Recommendations  None recommended by PT    Recommendations for Other Services       Precautions / Restrictions Precautions Precautions: Fall;Knee Precaution Comments: reviewed KI on at all times and no knee flexion Required Braces or Orthoses: Knee Immobilizer - Left Knee Immobilizer - Left: On at all times Restrictions LLE Weight Bearing: Weight bearing as tolerated     Mobility  Bed Mobility Overal bed mobility: Needs Assistance Bed Mobility: Supine to Sit     Supine to sit: Min assist     General bed mobility comments: assist for L LE, increased time    Transfers Overall transfer level: Needs assistance Equipment used: Rolling walker (2 wheels) Transfers: Sit to/from  Stand Sit to Stand: Min assist           General transfer comment: multimodal cues for hand placement and LE positioning; posterior lean upon rise requiring assist    Ambulation/Gait Ambulation/Gait assistance: Min assist Gait Distance (Feet): 25 Feet Assistive device: Rolling walker (2 wheels) Gait Pattern/deviations: Step-to pattern;Decreased stance time - left;Antalgic;Decreased weight shift to left       General Gait Details: verbal cues for sequence, RW positioning, posture; distance to pt tolerance, assist for stability   Stairs             Wheelchair Mobility    Modified Rankin (Stroke Patients Only)       Balance                                            Cognition Arousal/Alertness: Awake/alert Behavior During Therapy: WFL for tasks assessed/performed                                   General Comments: hx dementia, memory loss; requires cues for technique and precautions        Exercises      General Comments        Pertinent Vitals/Pain Pain Assessment: Faces Faces Pain Scale: Hurts little more Pain Location: L knee Pain Descriptors / Indicators: Sore;Tender Pain Intervention(s): Repositioned;Monitored during session    Home Living  Prior Function            PT Goals (current goals can now be found in the care plan section) Progress towards PT goals: Progressing toward goals    Frequency    7X/week      PT Plan Current plan remains appropriate    Co-evaluation              AM-PAC PT "6 Clicks" Mobility   Outcome Measure  Help needed turning from your back to your side while in a flat bed without using bedrails?: A Little Help needed moving from lying on your back to sitting on the side of a flat bed without using bedrails?: A Little Help needed moving to and from a bed to a chair (including a wheelchair)?: A Lot Help needed standing up from a  chair using your arms (e.g., wheelchair or bedside chair)?: A Lot Help needed to walk in hospital room?: A Lot Help needed climbing 3-5 steps with a railing? : Total 6 Click Score: 13    End of Session Equipment Utilized During Treatment: Gait belt;Left knee immobilizer Activity Tolerance: Patient tolerated treatment well Patient left: with call bell/phone within reach;in chair Nurse Communication: Mobility status PT Visit Diagnosis: Muscle weakness (generalized) (M62.81);Difficulty in walking, not elsewhere classified (R26.2)     Time: 6283-1517 PT Time Calculation (min) (ACUTE ONLY): 14 min  Charges:  $Gait Training: 8-22 mins                    Jannette Spanner PT, DPT Acute Rehabilitation Services Pager: [phone] Office: [place] 01/18/2021, 4:48 PM

## 2021-01-18 NOTE — Plan of Care (Signed)
Discharge report given to [place] at [facility].

## 2021-01-18 NOTE — Discharge Summary (Signed)
Physician Discharge Summary   Patient ID: VALA RAFFO MRN: [id number] DOB/AGE: 05/23/1934 86 y.o.  Admit date: 01/16/2021 Discharge date: 01/18/2021  Primary Diagnosis: Left patellar tendon rupture   Admission Diagnoses:  Past Medical History:  Diagnosis Date   Dementia ([place])    Gait abnormality 06/20/2020   Hypertension    Hyponatremia    Iron deficiency anemia    Major depression    OA (osteoarthritis)    Pre-diabetes    Vitamin B 12 deficiency    Discharge Diagnoses:   Principal Problem:   Patellar tendon rupture, left, subsequent encounter Active Problems:   Patellar tendon rupture, left, initial encounter  Estimated body mass index is 22.55 kg/m as calculated from the following:   Height as of this encounter: 5\' 3"  (1.6 m).   Weight as of this encounter: 57.7 kg.  Procedure:  Procedure(s) (LRB): Left patellar tendon reconstruction (Left)   Consults: None  HPI: The patient is an 86 year old female who had a left total knee arthroplasty done about 4-6 weeks ago.  She was doing physical therapy and noted a pop with significant increased pain.  The concern was for possible patellar tendon rupture.  She lost the ability to do a straight leg raise or fully extend her knee.  Exam and history were consistent with patellar tendon rupture.  She presents now for repair.  Laboratory Data: Admission on 01/16/2021  Component Date Value Ref Range Status   SARS Coronavirus 2 01/16/2021 NEGATIVE  NEGATIVE Final   Comment: (NOTE) SARS-CoV-2 target nucleic acids are NOT DETECTED.  The SARS-CoV-2 RNA is generally detectable in upper and lower respiratory specimens during the acute phase of infection. The lowest concentration of SARS-CoV-2 viral copies this assay can detect is 250 copies / mL. A negative result does not preclude SARS-CoV-2 infection and should not be used as the sole basis for treatment or other patient management decisions.  A negative result may occur  with improper specimen collection / handling, submission of specimen other than nasopharyngeal swab, presence of viral mutation(s) within the areas targeted by this assay, and inadequate number of viral copies (<250 copies / mL). A negative result must be combined with clinical observations, patient history, and epidemiological information.  Fact Sheet for Patients:   StrictlyIdeas.no  Fact Sheet for Healthcare Providers: BankingDealers.co.za  This test is not yet approved or                           cleared by the [place] FDA and has been authorized for detection and/or diagnosis of SARS-CoV-2 by FDA under an Emergency Use Authorization (EUA).  This EUA will remain in effect (meaning this test can be used) for the duration of the COVID-19 declaration under Section 564(b)(1) of the Act, 21 U.S.C. section 360bbb-3(b)(1), unless the authorization is terminated or revoked sooner.  Performed at [facility], [place] [address], [place], [place] [address]    WBC 01/17/2021 6.5  4.0 - 10.5 K/uL Final   RBC 01/17/2021 2.92 (L)  3.87 - 5.11 MIL/uL Final   Hemoglobin 01/17/2021 9.4 (L)  12.0 - 15.0 g/dL Final   HCT 01/17/2021 28.4 (L)  36.0 - 46.0 % Final   MCV 01/17/2021 97.3  80.0 - 100.0 fL Final   MCH 01/17/2021 32.2  26.0 - 34.0 pg Final   MCHC 01/17/2021 33.1  30.0 - 36.0 g/dL Final   RDW 01/17/2021 14.6  11.5 - 15.5 % Final  Platelets 01/17/2021 228  150 - 400 K/uL Final   nRBC 01/17/2021 0.0  0.0 - 0.2 % Final   Performed at [facility], [place] [address], [place], [place] [address]   Sodium 01/17/2021 136  135 - 145 mmol/L Final   Potassium 01/17/2021 3.5  3.5 - 5.1 mmol/L Final   Chloride 01/17/2021 103  98 - 111 mmol/L Final   CO2 01/17/2021 28  22 - 32 mmol/L Final   Glucose, Bld 01/17/2021 90  70 - 99 mg/dL Final   Glucose reference range applies only to samples taken after fasting for  at least 8 hours.   BUN 01/17/2021 9  8 - 23 mg/dL Final   Creatinine, Ser 01/17/2021 0.50  0.44 - 1.00 mg/dL Final   Calcium 01/17/2021 8.2 (L)  8.9 - 10.3 mg/dL Final   GFR, Estimated 01/17/2021 >60  >60 mL/min Final   Comment: (NOTE) Calculated using the CKD-EPI Creatinine Equation (2021)    Anion gap 01/17/2021 5  5 - 15 Final   Performed at [facility], [place] [address] [place], [place] [address]   WBC 01/18/2021 6.5  4.0 - 10.5 K/uL Final   RBC 01/18/2021 2.64 (L)  3.87 - 5.11 MIL/uL Final   Hemoglobin 01/18/2021 8.3 (L)  12.0 - 15.0 g/dL Final   HCT 01/18/2021 25.6 (L)  36.0 - 46.0 % Final   MCV 01/18/2021 97.0  80.0 - 100.0 fL Final   MCH 01/18/2021 31.4  26.0 - 34.0 pg Final   MCHC 01/18/2021 32.4  30.0 - 36.0 g/dL Final   RDW 01/18/2021 14.6  11.5 - 15.5 % Final   Platelets 01/18/2021 214  150 - 400 K/uL Final   nRBC 01/18/2021 0.0  0.0 - 0.2 % Final   Performed at [facility], [place] [address], [place], [place] [address]   Sodium 01/18/2021 132 (L)  135 - 145 mmol/L Final   Potassium 01/18/2021 3.3 (L)  3.5 - 5.1 mmol/L Final   Chloride 01/18/2021 100  98 - 111 mmol/L Final   CO2 01/18/2021 27  22 - 32 mmol/L Final   Glucose, Bld 01/18/2021 103 (H)  70 - 99 mg/dL Final   Glucose reference range applies only to samples taken after fasting for at least 8 hours.   BUN 01/18/2021 10  8 - 23 mg/dL Final   Creatinine, Ser 01/18/2021 0.51  0.44 - 1.00 mg/dL Final   Calcium 01/18/2021 8.2 (L)  8.9 - 10.3 mg/dL Final   GFR, Estimated 01/18/2021 >60  >60 mL/min Final   Comment: (NOTE) Calculated using the CKD-EPI Creatinine Equation (2021)    Anion gap 01/18/2021 5  5 - 15 Final   Performed at [facility], [place] [address] [place], [place] [address]  Nursing Home on 01/11/2021  Component Date Value Ref Range Status   Glucose 01/02/2021 106   Final   BUN 01/02/2021 14  4 - 21 Final   CO2 01/02/2021 24 (A)  13 - 22 Final    Creatinine 01/02/2021 0.7  0.5 - 1.1 Final   Potassium 01/02/2021 4.5  3.4 - 5.3 Final   Sodium 01/02/2021 134 (A)  137 - 147 Final   Chloride 01/02/2021 100  99 - 108 Final   Calcium 01/02/2021 8.9  8.7 - 10.7 Final  Abstract on 01/03/2021  Component Date Value Ref Range Status   Glucose 12/29/2020 105   Final   GFR calc Af Amer 12/29/2020 >90  Final   GFR calc non Af Amer 12/29/2020 85.64   Final   Calcium 12/29/2020 9.0  8.7 - 10.7 Final  Nursing Home on 12/30/2020  Component Date Value Ref Range Status   Hemoglobin 12/29/2020 10.6 (A)  12.0 - 16.0 Final   HCT 12/29/2020 31 (A)  36 - 46 Final   Platelets 12/29/2020 267  150 - 399 Final   WBC 12/29/2020 5.3   Final   RBC 12/29/2020 3.34 (A)  3.87 - 5.11 Final   Glucose 12/29/2020 105   Final   BUN 12/29/2020 9  4 - 21 Final   CO2 12/29/2020 21  13 - 22 Final   Creatinine 12/29/2020 0.6  0.5 - 1.1 Final   Potassium 12/29/2020 4.2  3.4 - 5.3 Final   Sodium 12/29/2020 128 (A)  137 - 147 Final   Chloride 12/29/2020 92 (A)  99 - 108 Final   Globulin 12/29/2020 2.6   Final   Calcium 12/29/2020 9.0  8.7 - 10.7 Final   Albumin 12/29/2020 3.7  3.5 - 5.0 Final   Alkaline Phosphatase 12/29/2020 130 (A)  25 - 125 Final   ALT 12/29/2020 15  7 - 35 Final   AST 12/29/2020 16  13 - 35 Final   Bilirubin, Total 12/29/2020 0.4   Final  Abstract on 12/29/2020  Component Date Value Ref Range Status   Glucose 12/26/2020 84   Final   BUN 12/26/2020 12  4 - 21 Final   CO2 12/26/2020 24 (A)  13 - 22 Final   Creatinine 12/26/2020 0.7  0.5 - 1.1 Final   Potassium 12/26/2020 5.4 (A)  3.4 - 5.3 Final   Sodium 12/26/2020 130 (A)  137 - 147 Final   Chloride 12/26/2020 94 (A)  99 - 108 Final   Calcium 12/26/2020 9.0  8.7 - 10.7 Final  Admission on 12/21/2020, Discharged on 12/23/2020  Component Date Value Ref Range Status   Lactic Acid, Venous 12/21/2020 0.7  0.5 - 1.9 mmol/L Final   Performed at [facility], [address] [place], [place] [address]   Lactic Acid, Venous 12/22/2020 0.7  0.5 - 1.9 mmol/L Final   Performed at [facility], [place] [address], [place], [place] [address]   Sodium 12/21/2020 125 (L)  135 - 145 mmol/L Final   Potassium 12/21/2020 4.6  3.5 - 5.1 mmol/L Final   Chloride 12/21/2020 94 (L)  98 - 111 mmol/L Final   CO2 12/21/2020 24  22 - 32 mmol/L Final   Glucose, Bld 12/21/2020 119 (H)  70 - 99 mg/dL Final   Glucose reference range applies only to samples taken after fasting for at least 8 hours.   BUN 12/21/2020 13  8 - 23 mg/dL Final   Creatinine, Ser 12/21/2020 0.85  0.44 - 1.00 mg/dL Final   Calcium 12/21/2020 8.5 (L)  8.9 - 10.3 mg/dL Final   Total Protein 12/21/2020 6.9  6.5 - 8.1 g/dL Final   Albumin 12/21/2020 3.4 (L)  3.5 - 5.0 g/dL Final   AST 12/21/2020 21  15 - 41 U/L Final   ALT 12/21/2020 27  0 - 44 U/L Final   Alkaline Phosphatase 12/21/2020 122  38 - 126 U/L Final   Total Bilirubin 12/21/2020 0.5  0.3 - 1.2 mg/dL Final   GFR, Estimated 12/21/2020 >60  >60 mL/min Final   Comment: (NOTE) Calculated using the CKD-EPI Creatinine Equation (2021)    Anion gap 12/21/2020 7  5 -  15 Final   Performed at [facility], [place], [place] [address]   WBC 12/21/2020 11.6 (H)  4.0 - 10.5 K/uL Final   RBC 12/21/2020 3.38 (L)  3.87 - 5.11 MIL/uL Final   Hemoglobin 12/21/2020 10.4 (L)  12.0 - 15.0 g/dL Final   HCT 12/21/2020 31.9 (L)  36.0 - 46.0 % Final   MCV 12/21/2020 94.4  80.0 - 100.0 fL Final   MCH 12/21/2020 30.8  26.0 - 34.0 pg Final   MCHC 12/21/2020 32.6  30.0 - 36.0 g/dL Final   RDW 12/21/2020 14.6  11.5 - 15.5 % Final   Platelets 12/21/2020 349  150 - 400 K/uL Final   nRBC 12/21/2020 0.0  0.0 - 0.2 % Final   Neutrophils Relative % 12/21/2020 80  % Final   Neutro Abs 12/21/2020 9.2 (H)  1.7 - 7.7 K/uL Final   Lymphocytes Relative 12/21/2020 9  % Final   Lymphs Abs 12/21/2020 1.0  0.7 - 4.0 K/uL Final   Monocytes  Relative 12/21/2020 8  % Final   Monocytes Absolute 12/21/2020 0.9  0.1 - 1.0 K/uL Final   Eosinophils Relative 12/21/2020 1  % Final   Eosinophils Absolute 12/21/2020 0.1  0.0 - 0.5 K/uL Final   Basophils Relative 12/21/2020 0  % Final   Basophils Absolute 12/21/2020 0.1  0.0 - 0.1 K/uL Final   Immature Granulocytes 12/21/2020 2  % Final   Abs Immature Granulocytes 12/21/2020 0.28 (H)  0.00 - 0.07 K/uL Final   Performed at [facility], [address], [place], [place] [address]   Color, Urine 12/22/2020 YELLOW  YELLOW Final   APPearance 12/22/2020 CLEAR  CLEAR Final   Specific Gravity, Urine 12/22/2020 1.011  1.005 - 1.030 Final   pH 12/22/2020 5.5  5.0 - 8.0 Final   Glucose, UA 12/22/2020 NEGATIVE  NEGATIVE mg/dL Final   Hgb urine dipstick 12/22/2020 NEGATIVE  NEGATIVE Final   Bilirubin Urine 12/22/2020 NEGATIVE  NEGATIVE Final   Ketones, ur 12/22/2020 NEGATIVE  NEGATIVE mg/dL Final   Protein, ur 12/22/2020 NEGATIVE  NEGATIVE mg/dL Final   Nitrite 12/22/2020 NEGATIVE  NEGATIVE Final   Leukocytes,Ua 12/22/2020 NEGATIVE  NEGATIVE Final   Performed at [facility], [address] [place], [place] [address]   Sodium, Ur 12/22/2020 91  mmol/L Final   Potassium Urine 12/22/2020 40  mmol/L Final   Performed at [facility], [place] [address] [place], [place] [address]   TSH 12/21/2020 1.488  0.350 - 4.500 uIU/mL Final   Comment: Performed by a 3rd Generation assay with a functional sensitivity of <=0.01 uIU/mL. Performed at [facility], [place] [address] [place], [place] [address]    Magnesium 12/21/2020 1.8  1.7 - 2.4 mg/dL Final   Performed at [facility], [address], [place], [place] [address]   Phosphorus 12/21/2020 2.2 (L)  2.5 - 4.6 mg/dL Final   Performed at [facility], [place], [place] [address]   Osmolality 12/21/2020 269 (L)  275 - 295 mOsm/kg Final   Performed at  [facility], [place] [address], [place],  [address]   SARS Coronavirus 2 by RT PCR 12/21/2020 NEGATIVE  NEGATIVE Final   Comment: (NOTE) SARS-CoV-2 target nucleic acids are NOT DETECTED.  The SARS-CoV-2 RNA is generally detectable in upper respiratory specimens during the acute phase of infection. The lowest concentration of SARS-CoV-2 viral copies this assay can detect is 138  copies/mL. A negative result does not preclude SARS-Cov-2 infection and should not be used as the sole basis for treatment or other patient management decisions. A negative result may occur with  improper specimen collection/handling, submission of specimen other than nasopharyngeal swab, presence of viral mutation(s) within the areas targeted by this assay, and inadequate number of viral copies(<138 copies/mL). A negative result must be combined with clinical observations, patient history, and epidemiological information. The expected result is Negative.  Fact Sheet for Patients:  EntrepreneurPulse.com.au  Fact Sheet for Healthcare Providers:  IncredibleEmployment.be  This test is no                          t yet approved or cleared by the [place] FDA and  has been authorized for detection and/or diagnosis of SARS-CoV-2 by FDA under an Emergency Use Authorization (EUA). This EUA will remain  in effect (meaning this test can be used) for the duration of the COVID-19 declaration under Section 564(b)(1) of the Act, 21 U.S.C.section 360bbb-3(b)(1), unless the authorization is terminated  or revoked sooner.       Influenza A by PCR 12/21/2020 NEGATIVE  NEGATIVE Final   Influenza B by PCR 12/21/2020 NEGATIVE  NEGATIVE Final   Comment: (NOTE) The Xpert Xpress SARS-CoV-2/FLU/RSV plus assay is intended as an aid in the diagnosis of influenza from Nasopharyngeal swab specimens and should not be used as a sole basis for treatment. Nasal washings and aspirates  are unacceptable for Xpert Xpress SARS-CoV-2/FLU/RSV testing.  Fact Sheet for Patients: EntrepreneurPulse.com.au  Fact Sheet for Healthcare Providers: IncredibleEmployment.be  This test is not yet approved or cleared by the [place] FDA and has been authorized for detection and/or diagnosis of SARS-CoV-2 by FDA under an Emergency Use Authorization (EUA). This EUA will remain in effect (meaning this test can be used) for the duration of the COVID-19 declaration under Section 564(b)(1) of the Act, 21 U.S.C. section 360bbb-3(b)(1), unless the authorization is terminated or revoked.  Performed at [facility], [place], [place] [address]    Sodium 12/22/2020 127 (L)  135 - 145 mmol/L Final   Potassium 12/22/2020 4.2  3.5 - 5.1 mmol/L Final   Chloride 12/22/2020 94 (L)  98 - 111 mmol/L Final   CO2 12/22/2020 24  22 - 32 mmol/L Final   Glucose, Bld 12/22/2020 127 (H)  70 - 99 mg/dL Final   Glucose reference range applies only to samples taken after fasting for at least 8 hours.   BUN 12/22/2020 15  8 - 23 mg/dL Final   Creatinine, Ser 12/22/2020 0.64  0.44 - 1.00 mg/dL Final   Calcium 12/22/2020 8.8 (L)  8.9 - 10.3 mg/dL Final   GFR, Estimated 12/22/2020 >60  >60 mL/min Final   Comment: (NOTE) Calculated using the CKD-EPI Creatinine Equation (2021)    Anion gap 12/22/2020 9  5 - 15 Final   Performed at [facility], [place], [place] [address]   Sodium 12/23/2020 129 (L)  135 - 145 mmol/L Final   Potassium 12/23/2020 3.7  3.5 - 5.1 mmol/L Final   Chloride 12/23/2020 95 (L)  98 - 111 mmol/L Final   CO2 12/23/2020 23  22 - 32 mmol/L Final   Glucose, Bld 12/23/2020 108 (H)  70 - 99 mg/dL Final   Glucose reference range applies only to samples taken after fasting for at least 8 hours.   BUN 12/23/2020 14  8 -  23 mg/dL Final   Creatinine, Ser 12/23/2020 0.75  0.44 - 1.00 mg/dL  Final   Calcium 12/23/2020 8.6 (L)  8.9 - 10.3 mg/dL Final   GFR, Estimated 12/23/2020 >60  >60 mL/min Final   Comment: (NOTE) Calculated using the CKD-EPI Creatinine Equation (2021)    Anion gap 12/23/2020 11  5 - 15 Final   Performed at [facility], [place] [address] [place], [place] [address]  Abstract on 12/21/2020  Component Date Value Ref Range Status   Hemoglobin 12/19/2020 9.2 (A)  12.0 - 16.0 Final   HCT 12/19/2020 26 (A)  36 - 46 Final   Platelets 12/19/2020 268  150 - 399 Final   WBC 12/19/2020 10.4   Final   RBC 12/19/2020 2.83 (A)  3.87 - 5.11 Final  Admission on 12/12/2020, Discharged on 12/14/2020  Component Date Value Ref Range Status   WBC 12/13/2020 8.0  4.0 - 10.5 K/uL Final   RBC 12/13/2020 2.87 (L)  3.87 - 5.11 MIL/uL Final   Hemoglobin 12/13/2020 9.1 (L)  12.0 - 15.0 g/dL Final   HCT 12/13/2020 27.8 (L)  36.0 - 46.0 % Final   MCV 12/13/2020 96.9  80.0 - 100.0 fL Final   MCH 12/13/2020 31.7  26.0 - 34.0 pg Final   MCHC 12/13/2020 32.7  30.0 - 36.0 g/dL Final   RDW 12/13/2020 13.9  11.5 - 15.5 % Final   Platelets 12/13/2020 262  150 - 400 K/uL Final   nRBC 12/13/2020 0.0  0.0 - 0.2 % Final   Performed at [facility], [place] [address], [place], [place] [address]   Sodium 12/13/2020 134 (L)  135 - 145 mmol/L Final   Potassium 12/13/2020 4.5  3.5 - 5.1 mmol/L Final   Chloride 12/13/2020 107  98 - 111 mmol/L Final   CO2 12/13/2020 21 (L)  22 - 32 mmol/L Final   Glucose, Bld 12/13/2020 105 (H)  70 - 99 mg/dL Final   Glucose reference range applies only to samples taken after fasting for at least 8 hours.   BUN 12/13/2020 19  8 - 23 mg/dL Final   Creatinine, Ser 12/13/2020 0.77  0.44 - 1.00 mg/dL Final   Calcium 12/13/2020 8.0 (L)  8.9 - 10.3 mg/dL Final   GFR, Estimated 12/13/2020 >60  >60 mL/min Final   Comment: (NOTE) Calculated using the CKD-EPI Creatinine Equation (2021)    Anion gap 12/13/2020 6  5 - 15 Final    Performed at [facility], [place] [address], [place], [place] [address]   SARS Coronavirus 2 by RT PCR 12/13/2020 NEGATIVE  NEGATIVE Final   Comment: (NOTE) SARS-CoV-2 target nucleic acids are NOT DETECTED.  The SARS-CoV-2 RNA is generally detectable in upper respiratory specimens during the acute phase of infection. The lowest concentration of SARS-CoV-2 viral copies this assay can detect is 138 copies/mL. A negative result does not preclude SARS-Cov-2 infection and should not be used as the sole basis for treatment or other patient management decisions. A negative result may occur with  improper specimen collection/handling, submission of specimen other than nasopharyngeal swab, presence of viral mutation(s) within the areas targeted by this assay, and inadequate number of viral copies(<138 copies/mL). A negative result must be combined with clinical observations, patient history, and epidemiological information. The expected result is Negative.  Fact Sheet for Patients:  EntrepreneurPulse.com.au  Fact Sheet for Healthcare Providers:  IncredibleEmployment.be  This test is no  t yet approved or cleared by the [place] and  has been authorized for detection and/or diagnosis of SARS-CoV-2 by FDA under an Emergency Use Authorization (EUA). This EUA will remain  in effect (meaning this test can be used) for the duration of the COVID-19 declaration under Section 564(b)(1) of the Act, 21 U.S.C.section 360bbb-3(b)(1), unless the authorization is terminated  or revoked sooner.       Influenza A by PCR 12/13/2020 NEGATIVE  NEGATIVE Final   Influenza B by PCR 12/13/2020 NEGATIVE  NEGATIVE Final   Comment: (NOTE) The Xpert Xpress SARS-CoV-2/FLU/RSV plus assay is intended as an aid in the diagnosis of influenza from Nasopharyngeal swab specimens and should not be used as a sole basis for treatment. Nasal  washings and aspirates are unacceptable for Xpert Xpress SARS-CoV-2/FLU/RSV testing.  Fact Sheet for Patients: EntrepreneurPulse.com.au  Fact Sheet for Healthcare Providers: IncredibleEmployment.be  This test is not yet approved or cleared by the [place] FDA and has been authorized for detection and/or diagnosis of SARS-CoV-2 by FDA under an Emergency Use Authorization (EUA). This EUA will remain in effect (meaning this test can be used) for the duration of the COVID-19 declaration under Section 564(b)(1) of the Act, 21 U.S.C. section 360bbb-3(b)(1), unless the authorization is terminated or revoked.  Performed at [facility], [place] [address] [place], [place] [address]    WBC 12/14/2020 7.6  4.0 - 10.5 K/uL Final   RBC 12/14/2020 3.02 (L)  3.87 - 5.11 MIL/uL Final   Hemoglobin 12/14/2020 9.4 (L)  12.0 - 15.0 g/dL Final   HCT 12/14/2020 29.1 (L)  36.0 - 46.0 % Final   MCV 12/14/2020 96.4  80.0 - 100.0 fL Final   MCH 12/14/2020 31.1  26.0 - 34.0 pg Final   MCHC 12/14/2020 32.3  30.0 - 36.0 g/dL Final   RDW 12/14/2020 14.3  11.5 - 15.5 % Final   Platelets 12/14/2020 264  150 - 400 K/uL Final   nRBC 12/14/2020 0.0  0.0 - 0.2 % Final   Performed at [facility], [place] [address], [place], [place] [address]   Sodium 12/14/2020 135  135 - 145 mmol/L Final   Potassium 12/14/2020 4.0  3.5 - 5.1 mmol/L Final   Chloride 12/14/2020 107  98 - 111 mmol/L Final   CO2 12/14/2020 23  22 - 32 mmol/L Final   Glucose, Bld 12/14/2020 89  70 - 99 mg/dL Final   Glucose reference range applies only to samples taken after fasting for at least 8 hours.   BUN 12/14/2020 15  8 - 23 mg/dL Final   Creatinine, Ser 12/14/2020 0.80  0.44 - 1.00 mg/dL Final   Calcium 12/14/2020 8.2 (L)  8.9 - 10.3 mg/dL Final   GFR, Estimated 12/14/2020 >60  >60 mL/min Final   Comment: (NOTE) Calculated using the CKD-EPI Creatinine Equation  (2021)    Anion gap 12/14/2020 5  5 - 15 Final   Performed at [facility], [place] [address] [place], [place] [address]  Orders Only on 12/08/2020  Component Date Value Ref Range Status   SARS Coronavirus 2 12/08/2020 RESULT: NEGATIVE   Final   Comment: RESULT: NEGATIVESARS-CoV-2 INTERPRETATION:A NEGATIVE  test result means that SARS-CoV-2 RNA was not present in the specimen above the limit of detection of this test. This does not preclude a possible SARS-CoV-2 infection and should not be used as the  sole basis for patient management decisions. Negative results must be combined with  clinical observations, patient history, and epidemiological information. Optimum specimen types and timing for peak viral levels during infections caused by SARS-CoV-2  have not been determined. Collection of multiple specimens or types of specimens may be necessary to detect virus. Improper specimen collection and handling, sequence variability under primers/probes, or organism present below the limit of detection may  lead to false negative results. Positive and negative predictive values of testing are highly dependent on prevalence. False negative test results are more likely when prevalence of disease is high.The expected result is NEGATIVE.Fact S                          heet for  Healthcare Providers: LocalChronicle.no Sheet for Patients: SalonLookup.es Reference Range - Negative   Hospital Outpatient Visit on 12/02/2020  Component Date Value Ref Range Status   MRSA, PCR 12/02/2020 NEGATIVE  NEGATIVE Final   Staphylococcus aureus 12/02/2020 NEGATIVE  NEGATIVE Final   Comment: (NOTE) The Xpert SA Assay (FDA approved for NASAL specimens in patients 31 years of age and older), is one component of a comprehensive surveillance program. It is not intended to diagnose infection nor to guide or monitor treatment. Performed at [facility], [place] [address], [place], [place] [address]    WBC 12/02/2020 5.5  4.0 - 10.5 K/uL Final   RBC 12/02/2020 3.40 (L)  3.87 - 5.11 MIL/uL Final   Hemoglobin 12/02/2020 10.3 (L)  12.0 - 15.0 g/dL Final   HCT 12/02/2020 32.8 (L)  36.0 - 46.0 % Final   MCV 12/02/2020 96.5  80.0 - 100.0 fL Final   MCH 12/02/2020 30.3  26.0 - 34.0 pg Final   MCHC 12/02/2020 31.4  30.0 - 36.0 g/dL Final   RDW 12/02/2020 13.3  11.5 - 15.5 % Final   Platelets 12/02/2020 359  150 - 400 K/uL Final   nRBC 12/02/2020 0.0  0.0 - 0.2 % Final   Performed at [facility], [place] [address], [place], [place] [address]   Sodium 12/02/2020 144  135 - 145 mmol/L Final   Potassium 12/02/2020 4.2  3.5 - 5.1 mmol/L Final   Chloride 12/02/2020 106  98 - 111 mmol/L Final   CO2 12/02/2020 29  22 - 32 mmol/L Final   Glucose, Bld 12/02/2020 94  70 - 99 mg/dL Final   Glucose reference range applies only to samples taken after fasting for at least 8 hours.   BUN 12/02/2020 26 (H)  8 - 23 mg/dL Final   Creatinine, Ser 12/02/2020 1.01 (H)  0.44 - 1.00 mg/dL Final   Calcium 12/02/2020 8.9  8.9 - 10.3 mg/dL Final   Total Protein 12/02/2020 7.1  6.5 - 8.1 g/dL Final   Albumin 12/02/2020 3.4 (L)  3.5 - 5.0 g/dL Final   AST 12/02/2020 14 (L)  15 - 41 U/L Final   ALT 12/02/2020 12  0 - 44 U/L Final   Alkaline Phosphatase 12/02/2020 66  38 - 126 U/L Final   Total Bilirubin 12/02/2020 0.3  0.3 - 1.2 mg/dL Final   GFR, Estimated 12/02/2020 55 (L)  >60 mL/min Final   Comment: (NOTE) Calculated using the CKD-EPI Creatinine Equation (2021)    Anion gap 12/02/2020 9  5 - 15 Final   Performed at [facility], [place] [address] [place], [place] [address]   ABO/RH(D) 12/02/2020 A POS   Final   Antibody Screen 12/02/2020 NEG   Final   Sample  Expiration 12/02/2020 12/15/2020,2359   Final   Extend sample reason 12/02/2020    Final                   Value:NO TRANSFUSIONS OR PREGNANCY IN THE PAST  3 MONTHS Performed at [facility] [address], [place], [place] [address]    Prothrombin Time 12/02/2020 12.1  11.4 - 15.2 seconds Final   INR 12/02/2020 0.9  0.8 - 1.2 Final   Comment: (NOTE) INR goal varies based on device and disease states. Performed at [facility], [place] [address], [place],  [address]   There may be more visits with results that are not included.     X-Rays:DG Knee 2 Views Left  Result Date: 12/21/2020 CLINICAL DATA:  Knee replacement on Monday presenting with significant pain and swelling to the calf with pain radiating to the upper thigh and down to the ankle. EXAM: LEFT KNEE - 1-2 VIEW COMPARISON:  Left knee radiograph 10/21/2017 FINDINGS: Diffuse osteopenia. Interval postoperative changes of left total knee arthroplasty with cemented femoral and tibial components. Hardware is intact and in appropriate position without evidence of complication. Moderate suprapatellar knee joint effusion. Diffuse soft tissue swelling about the knee. Benign-appearing oblong lucent lesion in the distal femoral shaft with narrow transition zone and sharp margins, possibly surgery related. IMPRESSION: Interval postoperative changes of left total knee arthroplasty. Hardware is intact without evidence of complication. Moderate suprapatellar left knee joint effusion. Soft tissue swelling around the knee. Electronically Signed   By: Ileana Roup M.D.   On: 12/21/2020 17:47   US Venous Img Lower Unilateral Left  Result Date: 12/21/2020 CLINICAL DATA:  Left total knee arthroplasty with knee and calf edema. EXAM: LEFT LOWER EXTREMITY VENOUS DOPPLER ULTRASOUND TECHNIQUE: Gray-scale sonography with compression, as well as color and duplex ultrasound, were performed to evaluate the deep venous system(s) from the level of the common femoral vein through the popliteal and proximal calf veins. COMPARISON:  None. FINDINGS: VENOUS Normal compressibility of  the common femoral, superficial femoral, and popliteal veins, as well as the visualized calf veins. Visualized portions of profunda femoral vein and great saphenous vein unremarkable. No filling defects to suggest DVT on grayscale or color Doppler imaging. Doppler waveforms show normal direction of venous flow, normal respiratory plasticity and response to augmentation. Limited views of the contralateral common femoral vein are unremarkable. OTHER None. Limitations: none IMPRESSION: Negative. Electronically Signed   By: Virgina Norfolk M.D.   On: 12/21/2020 19:46    EKG: Orders placed or performed during the hospital encounter of 12/02/20   EKG 12 lead per protocol   EKG 12 lead per protocol     Hospital Course: Amy Carroll is a 86 y.o. who was admitted to [facility]. They were brought to the operating room on 01/16/2021 and underwent Procedure(s): Left patellar tendon reconstruction.  Patient tolerated the procedure well and was later transferred to the recovery room and then to the orthopaedic floor for postoperative care. They were given PO and IV analgesics for pain control following their surgery. They were given 24 hours of postoperative antibiotics of  Anti-infectives (From admission, onward)    Start     Dose/Rate Route Frequency Ordered Stop   01/16/21 1345  ceFAZolin (ANCEF) IVPB 2g/100 mL premix        2 g 200 mL/hr over 30 Minutes Intravenous On call to O.R. 01/16/21 1332 01/16/21 1701      and started on  DVT prophylaxis in the form of Aspirin.   PT and OT were ordered for total joint protocol. Discharge planning consulted to help with postop disposition and equipment needs. They started to get up OOB with therapy on POD #1. Continued to work with therapy into POD #2. Pt was seen during rounds on day two and was ready to discharge back to [facility]. Dressing was changed and the incision was clean, dry, and intact. Social work arranged transfer and patient was stable.  She was discharged back to [facility] SNF that day.  Diet: Regular diet Activity: WBAT No ROM to the right knee. Keep in knee immobilizer at all times. Follow-up: in 2 weeks Disposition: Skilled nursing facility Discharged Condition: stable   Discharge Instructions     Call MD / Call 911   Complete by: As directed    If you experience chest pain or shortness of breath, CALL 911 and be transported to the hospital emergency room.  If you develope a fever above 101 F, pus (white drainage) or increased drainage or redness at the wound, or calf pain, call your surgeon's office.   Change dressing   Complete by: As directed    You may remove the bulky bandage (ACE wrap and gauze) two days after surgery. You will have an adhesive waterproof bandage underneath. Leave this in place until your first follow-up appointment.   Constipation Prevention   Complete by: As directed    Drink plenty of fluids.  Prune juice may be helpful.  You may use a stool softener, such as Colace (over the counter) 100 mg twice a day.  Use MiraLax (over the counter) for constipation as needed.   Diet - low sodium heart healthy   Complete by: As directed    Do not put a pillow under the knee. Place it under the heel.   Complete by: As directed    Driving restrictions   Complete by: As directed    No driving for two weeks   Post-operative opioid taper instructions:   Complete by: As directed    POST-OPERATIVE OPIOID TAPER INSTRUCTIONS: It is important to wean off of your opioid medication as soon as possible. If you do not need pain medication after your surgery it is ok to stop day one. Opioids include: Codeine, Hydrocodone(Norco, Vicodin), Oxycodone(Percocet, oxycontin) and hydromorphone amongst others.  Long term and even short term use of opiods can cause: Increased pain response Dependence Constipation Depression Respiratory depression And more.  Withdrawal symptoms can include Flu like symptoms Nausea,  vomiting And more Techniques to manage these symptoms Hydrate well Eat regular healthy meals Stay active Use relaxation techniques(deep breathing, meditating, yoga) Do Not substitute Alcohol to help with tapering If you have been on opioids for less than two weeks and do not have pain than it is ok to stop all together.  Plan to wean off of opioids This plan should start within one week post op of your joint replacement. Maintain the same interval or time between taking each dose and first decrease the dose.  Cut the total daily intake of opioids by one tablet each day Next start to increase the time between doses. The last dose that should be eliminated is the evening dose.      TED hose   Complete by: As directed    Use stockings (TED hose) for three weeks on both leg(s).  You may remove them at night for sleeping.   Weight bearing as tolerated   Complete by:  As directed       Allergies as of 01/18/2021   No Known Allergies      Medication List     STOP taking these medications    HYDROcodone-acetaminophen 5-325 MG tablet Commonly known as: NORCO/VICODIN       TAKE these medications    acetaminophen 500 MG tablet Commonly known as: TYLENOL Take 500 mg by mouth every 6 (six) hours as needed for moderate pain.   acetaminophen 325 MG tablet Commonly known as: TYLENOL Take 650 mg by mouth 2 (two) times daily.   amLODipine 5 MG tablet Commonly known as: NORVASC Take 5 mg by mouth in the morning.   aspirin EC 81 MG tablet Take 81 mg by mouth in the morning. Swallow whole.   cholecalciferol 25 MCG (1000 UNIT) tablet Commonly known as: VITAMIN D3 Take 1,000 Units by mouth daily.   Combigan 0.2-0.5 % ophthalmic solution Generic drug: brimonidine-timolol Place 1 drop into the right eye in the morning and at bedtime.   dorzolamide 2 % ophthalmic solution Commonly known as: TRUSOPT Place 1 drop into the right eye 2 (two) times daily.   ferrous sulfate 325  (65 FE) MG tablet Take 325 mg by mouth daily with breakfast.   lactose free nutrition Liqd Take 237 mLs by mouth daily with supper.   latanoprost 0.005 % ophthalmic solution Commonly known as: XALATAN Place 1 drop into both eyes at bedtime.   methocarbamol 500 MG tablet Commonly known as: ROBAXIN Take 250 mg by mouth every 6 (six) hours as needed for muscle spasms.   olmesartan 40 MG tablet Commonly known as: BENICAR Take 1 tablet (40 mg total) by mouth daily.   polyethylene glycol 17 g packet Commonly known as: MIRALAX / GLYCOLAX Take 17 g by mouth daily. In the morning between 8-11 am   PRESERVISION AREDS 2 PO Take 1 capsule by mouth in the morning and at bedtime.   traMADol 50 MG tablet Commonly known as: ULTRAM Take 0.5 tablets (25 mg total) by mouth every 6 (six) hours as needed for moderate pain or severe pain (pain). What changed: reasons to take this               Discharge Care Instructions  (From admission, onward)           Start     Ordered   01/17/21 0000  Weight bearing as tolerated        01/17/21 0838   01/17/21 0000  Change dressing       Comments: You may remove the bulky bandage (ACE wrap and gauze) two days after surgery. You will have an adhesive waterproof bandage underneath. Leave this in place until your first follow-up appointment.   01/17/21 9381            Follow-up Information     Gaynelle Arabian, MD. Schedule an appointment as soon as possible for a visit in 2 week(s).   Specialty: Orthopedic Surgery Contact information: [address] [place] [address] [phone]                 Signed: Theresa Duty, PA-C Orthopedic Surgery 01/18/2021, 7:37 AM

## 2021-01-23 ENCOUNTER — Non-Acute Institutional Stay (SKILLED_NURSING_FACILITY): Payer: Medicare Other | Admitting: Internal Medicine

## 2021-01-23 ENCOUNTER — Encounter: Payer: Self-pay | Admitting: Internal Medicine

## 2021-01-23 DIAGNOSIS — I10 Essential (primary) hypertension: Secondary | ICD-10-CM

## 2021-01-23 DIAGNOSIS — R2689 Other abnormalities of gait and mobility: Secondary | ICD-10-CM | POA: Diagnosis not present

## 2021-01-23 DIAGNOSIS — Z96652 Presence of left artificial knee joint: Secondary | ICD-10-CM

## 2021-01-23 DIAGNOSIS — D508 Other iron deficiency anemias: Secondary | ICD-10-CM | POA: Diagnosis not present

## 2021-01-23 DIAGNOSIS — R410 Disorientation, unspecified: Secondary | ICD-10-CM | POA: Diagnosis not present

## 2021-01-23 DIAGNOSIS — E871 Hypo-osmolality and hyponatremia: Secondary | ICD-10-CM

## 2021-01-23 DIAGNOSIS — M62562 Muscle wasting and atrophy, not elsewhere classified, left lower leg: Secondary | ICD-10-CM | POA: Diagnosis not present

## 2021-01-23 DIAGNOSIS — E538 Deficiency of other specified B group vitamins: Secondary | ICD-10-CM

## 2021-01-23 DIAGNOSIS — R41840 Attention and concentration deficit: Secondary | ICD-10-CM | POA: Diagnosis not present

## 2021-01-23 DIAGNOSIS — S86812S Strain of other muscle(s) and tendon(s) at lower leg level, left leg, sequela: Secondary | ICD-10-CM

## 2021-01-23 DIAGNOSIS — M1712 Unilateral primary osteoarthritis, left knee: Secondary | ICD-10-CM | POA: Diagnosis not present

## 2021-01-23 DIAGNOSIS — Z4789 Encounter for other orthopedic aftercare: Secondary | ICD-10-CM | POA: Diagnosis not present

## 2021-01-23 DIAGNOSIS — M66862 Spontaneous rupture of other tendons, left lower leg: Secondary | ICD-10-CM | POA: Diagnosis not present

## 2021-01-23 DIAGNOSIS — R278 Other lack of coordination: Secondary | ICD-10-CM | POA: Diagnosis not present

## 2021-01-23 DIAGNOSIS — Z471 Aftercare following joint replacement surgery: Secondary | ICD-10-CM | POA: Diagnosis not present

## 2021-01-23 NOTE — Progress Notes (Signed)
Location: [place] Room Number: 950 DTOIZ of Service:  SNF (31)  Provider:   Code Status:  Goals of Care:  Advanced Directives 01/16/2021  Does Patient Have a Medical Advance Directive? Yes  Type of Paramedic of [place];Living will  Does patient want to make changes to medical advance directive? No - Patient declined  Copy of [place] in Chart? Yes - validated most recent copy scanned in chart (See row information)  Would patient like information on creating a medical advance directive? -  Pre-existing out of facility DNR order (yellow form or pink MOST form) -     Chief Complaint  Patient presents with   Acute Visit    Follow up    HPI: Patient is a 86 y.o. female seen today for an acute visit for follow up for Left Knee TKA and then Patellar Repair  Underwent Left Patellar Repair on 11/21  Admitted in the hospital from 10/17-10/19 for left  total knee arthroplasty Also admitted in the hospital from 10/26 through 10/28 for hyponatremia and left knee swelling   Patient has history of anemia multifactorial due to iron deficiency and B12 follows with hematology and gets iron infusions as needed Also has history of hypertension and arthritis   She was electively admitted in the hospital for left TKA. Was sent back to the hospital for left leg swelling with worsening pain But was found to be hyponatremic.  Thought to be secondary to hydrochlorothiazide and decreased appetite.  She was hydrated and HCTZ was discontinued. For her left knee swelling x-ray showed postop changes with moderate suprapatellar joint effusion.  Dopplers were negative for any DVT.   But then seen by Ortho and was diagnosed with Left pattellar Tendon Rupture Underwent Repair Now in Immobilizer for 8 weeks Can walk on it No pain . Walking with the Therapy with no issues   Past Medical History:  Diagnosis Date    Dementia ([place])    Gait abnormality 06/20/2020   Hypertension    Hyponatremia    Iron deficiency anemia    Major depression    OA (osteoarthritis)    Pre-diabetes    Vitamin B 12 deficiency     Past Surgical History:  Procedure Laterality Date   ABDOMINAL HYSTERECTOMY     COLONOSCOPY     HEMANGIOMA EXCISION     JOINT REPLACEMENT     PATELLAR TENDON REPAIR Left 01/16/2021   Procedure: Left patellar tendon reconstruction;  Surgeon: Gaynelle Arabian, MD;  Location: [facility];  Service: Orthopedics;  Laterality: Left;   TONSILLECTOMY     TOTAL HIP ARTHROPLASTY Right 02/18/2019   Procedure: TOTAL HIP ARTHROPLASTY ANTERIOR APPROACH;  Surgeon: Gaynelle Arabian, MD;  Location: [facility];  Service: Orthopedics;  Laterality: Right;  118min   TOTAL KNEE ARTHROPLASTY Right 05/27/2012   Procedure: TOTAL KNEE ARTHROPLASTY;  Surgeon: Meredith Pel, MD;  Location: [place];  Service: Orthopedics;  Laterality: Right;  Right Total Knee Arthroplasty   TOTAL KNEE ARTHROPLASTY Left 12/12/2020   Procedure: TOTAL KNEE ARTHROPLASTY;  Surgeon: Gaynelle Arabian, MD;  Location: [facility];  Service: Orthopedics;  Laterality: Left;   TOTAL KNEE REVISION Right 08/06/2018   Procedure: TOTAL KNEE REVISION;  Surgeon: Gaynelle Arabian, MD;  Location: [facility];  Service: Orthopedics;  Laterality: Right;   TUBAL LIGATION      No Known Allergies  Outpatient Encounter Medications as of 01/23/2021  Medication Sig   acetaminophen (TYLENOL)  500 MG tablet Take 500 mg by mouth every 6 (six) hours as needed for moderate pain.   amLODipine (NORVASC) 5 MG tablet Take 5 mg by mouth in the morning.   aspirin EC 81 MG tablet Take 81 mg by mouth in the morning. Swallow whole.   Cyanocobalamin (B-12) 2500 MCG TABS Take 1 tablet by mouth daily.   acetaminophen (TYLENOL) 325 MG tablet Take 650 mg by mouth 2 (two) times daily.   cholecalciferol (VITAMIN D3) 25 MCG (1000 UT) tablet Take 1,000 Units by mouth daily.   COMBIGAN 0.2-0.5 % ophthalmic  solution Place 1 drop into the right eye in the morning and at bedtime.   dorzolamide (TRUSOPT) 2 % ophthalmic solution Place 1 drop into the right eye 2 (two) times daily.    ferrous sulfate 325 (65 FE) MG tablet Take 325 mg by mouth daily with breakfast.   lactose free nutrition (BOOST PLUS) LIQD Take 237 mLs by mouth daily with supper.   latanoprost (XALATAN) 0.005 % ophthalmic solution Place 1 drop into both eyes at bedtime.    methocarbamol (ROBAXIN) 500 MG tablet Take 250 mg by mouth every 6 (six) hours as needed for muscle spasms.   Multiple Vitamins-Minerals (PRESERVISION AREDS 2 PO) Take 1 capsule by mouth in the morning and at bedtime.   olmesartan (BENICAR) 40 MG tablet Take 1 tablet (40 mg total) by mouth daily.   polyethylene glycol (MIRALAX / GLYCOLAX) 17 g packet Take 17 g by mouth daily. In the morning between 8-11 am   traMADol (ULTRAM) 50 MG tablet Take 0.5 tablets (25 mg total) by mouth every 6 (six) hours as needed for moderate pain or severe pain (pain).   No facility-administered encounter medications on file as of 01/23/2021.    Review of Systems:  Review of Systems Review of Systems  Constitutional: Negative for activity change, appetite change, chills, diaphoresis, fatigue and fever.  HENT: Negative for mouth sores, postnasal drip, rhinorrhea, sinus pain and sore throat.   Respiratory: Negative for apnea, cough, chest tightness, shortness of breath and wheezing.   Cardiovascular: Negative for chest pain, palpitations and leg swelling.  Gastrointestinal: Negative for abdominal distention, abdominal pain, constipation, diarrhea, nausea and vomiting.  Genitourinary: Negative for dysuria and frequency.  Musculoskeletal: Negative for arthralgias, joint swelling and myalgias.  Skin: Negative for rash.  Neurological: Negative for dizziness, syncope, weakness, light-headedness and numbness.  Psychiatric/Behavioral: Negative for behavioral problems, confusion and sleep  disturbance.   Health Maintenance  Topic Date Due   TETANUS/TDAP  Never done   DEXA SCAN  Never done   COVID-19 Vaccine (5 - Booster) 02/03/2021   Pneumonia Vaccine 73+ Years old  Completed   INFLUENZA VACCINE  Completed   Zoster Vaccines- Shingrix  Completed   HPV VACCINES  Aged Out    Physical Exam: Vitals:   01/23/21 1425  BP: 138/68  Pulse: 82  Resp: 16  Temp: 98.1 F (36.7 C)  SpO2: 97%  Weight: 121 lb 6.4 oz (55.1 kg)  Height: 5\' 4"  (1.626 m)   Body mass index is 20.84 kg/m. Physical Exam Constitutional: . Well-developed and well-nourished.  HENT:  Head: Normocephalic.  Mouth/Throat: Oropharynx is clear and moist.  Eyes: Pupils are equal, round, and reactive to light.  Neck: Neck supple.  Cardiovascular: Normal rate and normal heart sounds.  No murmur heard. Pulmonary/Chest: Effort normal and breath sounds normal. No respiratory distress. No wheezes. She has no rales.  Abdominal: Soft. Bowel sounds are normal. No distension.  There is no tenderness. There is no rebound.  Musculoskeletal: No edema.  Lymphadenopathy: none Neurological: Alert No focal deficits Skin: Skin is warm and dry.  Psychiatric: Normal mood and affect. Behavior is normal. Thought content normal.   Labs reviewed: Basic Metabolic Panel: Recent Labs    12/21/20 1721 12/21/20 2050 12/22/20 1218 12/23/20 0433 12/26/20 0000 01/02/21 0000 01/17/21 0310 01/18/21 0320  NA 125*  --    < > 129*   < > 134* 136 132*  K 4.6  --    < > 3.7   < > 4.5 3.5 3.3*  CL 94*  --    < > 95*   < > 100 103 100  CO2 24  --    < > 23   < > 24* 28 27  GLUCOSE 119*  --    < > 108*  --   --  90 103*  BUN 13  --    < > 14   < > 14 9 10   CREATININE 0.85  --    < > 0.75   < > 0.7 0.50 0.51  CALCIUM 8.5*  --    < > 8.6*   < > 8.9 8.2* 8.2*  MG 1.8  --   --   --   --   --   --   --   PHOS 2.2*  --   --   --   --   --   --   --   TSH  --  1.488  --   --   --   --   --   --    < > = values in this interval not  displayed.   Liver Function Tests: Recent Labs    11/23/20 1434 12/02/20 1443 12/21/20 1721 12/29/20 0000  AST 11* 14* 21 16  ALT 10 12 27 15   ALKPHOS 81 66 122 130*  BILITOT 0.4 0.3 0.5  --   PROT 7.2 7.1 6.9  --   ALBUMIN 3.2* 3.4* 3.4* 3.7   No results for input(s): LIPASE, AMYLASE in the last 8760 hours. No results for input(s): AMMONIA in the last 8760 hours. CBC: Recent Labs    11/23/20 1434 12/02/20 1443 12/21/20 1721 12/29/20 0000 01/17/21 0310 01/18/21 0320  WBC 6.3   < > 11.6* 5.3 6.5 6.5  NEUTROABS 3.9  --  9.2*  --   --   --   HGB 9.8*   < > 10.4* 10.6* 9.4* 8.3*  HCT 29.6*   < > 31.9* 31* 28.4* 25.6*  MCV 92.5   < > 94.4  --  97.3 97.0  PLT 347   < > 349 267 228 214   < > = values in this interval not displayed.   Lipid Panel: No results for input(s): CHOL, HDL, LDLCALC, TRIG, CHOLHDL, LDLDIRECT in the last 8760 hours. No results found for: HGBA1C  Procedures since last visit: No results found.  Assessment/Plan  S/P Repair Patellar tendon rupture, left, sequela Doing well in Immobilizer for 8 weeks but can walk on it Pain is controlled  Status post total left knee replacement Doing well with therpay Hyponatremia Repeat BMP Off HCTZ and on FR Essential hypertension Controlled on Norvasc and Benicar  iron deficiency anemia On Iron Repeat CBC B12 deficiency Continue Supplement  Acute delirium Mental status on Baseline now  Labs/tests ordered:  CBC,BMP Next appt:  Visit date not found

## 2021-01-24 DIAGNOSIS — M62562 Muscle wasting and atrophy, not elsewhere classified, left lower leg: Secondary | ICD-10-CM | POA: Diagnosis not present

## 2021-01-24 DIAGNOSIS — Z471 Aftercare following joint replacement surgery: Secondary | ICD-10-CM | POA: Diagnosis not present

## 2021-01-24 DIAGNOSIS — M1712 Unilateral primary osteoarthritis, left knee: Secondary | ICD-10-CM | POA: Diagnosis not present

## 2021-01-24 DIAGNOSIS — R2689 Other abnormalities of gait and mobility: Secondary | ICD-10-CM | POA: Diagnosis not present

## 2021-01-24 DIAGNOSIS — R413 Other amnesia: Secondary | ICD-10-CM | POA: Diagnosis not present

## 2021-01-24 DIAGNOSIS — R41840 Attention and concentration deficit: Secondary | ICD-10-CM | POA: Diagnosis not present

## 2021-01-24 DIAGNOSIS — R41841 Cognitive communication deficit: Secondary | ICD-10-CM | POA: Diagnosis not present

## 2021-01-24 DIAGNOSIS — Z96652 Presence of left artificial knee joint: Secondary | ICD-10-CM | POA: Diagnosis not present

## 2021-01-24 DIAGNOSIS — Z4789 Encounter for other orthopedic aftercare: Secondary | ICD-10-CM | POA: Diagnosis not present

## 2021-01-24 DIAGNOSIS — R278 Other lack of coordination: Secondary | ICD-10-CM | POA: Diagnosis not present

## 2021-01-24 DIAGNOSIS — M66862 Spontaneous rupture of other tendons, left lower leg: Secondary | ICD-10-CM | POA: Diagnosis not present

## 2021-01-25 ENCOUNTER — Other Ambulatory Visit: Payer: Self-pay

## 2021-01-25 ENCOUNTER — Encounter (INDEPENDENT_AMBULATORY_CARE_PROVIDER_SITE_OTHER): Payer: Medicare Other | Admitting: Ophthalmology

## 2021-01-25 DIAGNOSIS — R2689 Other abnormalities of gait and mobility: Secondary | ICD-10-CM | POA: Diagnosis not present

## 2021-01-25 DIAGNOSIS — I10 Essential (primary) hypertension: Secondary | ICD-10-CM | POA: Diagnosis not present

## 2021-01-25 DIAGNOSIS — Z4789 Encounter for other orthopedic aftercare: Secondary | ICD-10-CM | POA: Diagnosis not present

## 2021-01-25 DIAGNOSIS — H353211 Exudative age-related macular degeneration, right eye, with active choroidal neovascularization: Secondary | ICD-10-CM

## 2021-01-25 DIAGNOSIS — H43813 Vitreous degeneration, bilateral: Secondary | ICD-10-CM | POA: Diagnosis not present

## 2021-01-25 DIAGNOSIS — M1712 Unilateral primary osteoarthritis, left knee: Secondary | ICD-10-CM | POA: Diagnosis not present

## 2021-01-25 DIAGNOSIS — M66862 Spontaneous rupture of other tendons, left lower leg: Secondary | ICD-10-CM | POA: Diagnosis not present

## 2021-01-25 DIAGNOSIS — H353122 Nonexudative age-related macular degeneration, left eye, intermediate dry stage: Secondary | ICD-10-CM

## 2021-01-25 DIAGNOSIS — Z96652 Presence of left artificial knee joint: Secondary | ICD-10-CM | POA: Diagnosis not present

## 2021-01-25 DIAGNOSIS — R413 Other amnesia: Secondary | ICD-10-CM | POA: Diagnosis not present

## 2021-01-25 DIAGNOSIS — R278 Other lack of coordination: Secondary | ICD-10-CM | POA: Diagnosis not present

## 2021-01-25 DIAGNOSIS — Z471 Aftercare following joint replacement surgery: Secondary | ICD-10-CM | POA: Diagnosis not present

## 2021-01-25 DIAGNOSIS — M62562 Muscle wasting and atrophy, not elsewhere classified, left lower leg: Secondary | ICD-10-CM | POA: Diagnosis not present

## 2021-01-25 DIAGNOSIS — H35033 Hypertensive retinopathy, bilateral: Secondary | ICD-10-CM | POA: Diagnosis not present

## 2021-01-25 DIAGNOSIS — R41841 Cognitive communication deficit: Secondary | ICD-10-CM | POA: Diagnosis not present

## 2021-01-25 DIAGNOSIS — R41840 Attention and concentration deficit: Secondary | ICD-10-CM | POA: Diagnosis not present

## 2021-01-26 DIAGNOSIS — M62562 Muscle wasting and atrophy, not elsewhere classified, left lower leg: Secondary | ICD-10-CM | POA: Diagnosis not present

## 2021-01-26 DIAGNOSIS — R2689 Other abnormalities of gait and mobility: Secondary | ICD-10-CM | POA: Diagnosis not present

## 2021-01-26 DIAGNOSIS — M66862 Spontaneous rupture of other tendons, left lower leg: Secondary | ICD-10-CM | POA: Diagnosis not present

## 2021-01-26 DIAGNOSIS — M1712 Unilateral primary osteoarthritis, left knee: Secondary | ICD-10-CM | POA: Diagnosis not present

## 2021-01-26 DIAGNOSIS — R413 Other amnesia: Secondary | ICD-10-CM | POA: Diagnosis not present

## 2021-01-26 DIAGNOSIS — Z4789 Encounter for other orthopedic aftercare: Secondary | ICD-10-CM | POA: Diagnosis not present

## 2021-01-26 DIAGNOSIS — Z96652 Presence of left artificial knee joint: Secondary | ICD-10-CM | POA: Diagnosis not present

## 2021-01-26 DIAGNOSIS — Z471 Aftercare following joint replacement surgery: Secondary | ICD-10-CM | POA: Diagnosis not present

## 2021-01-26 DIAGNOSIS — R278 Other lack of coordination: Secondary | ICD-10-CM | POA: Diagnosis not present

## 2021-01-26 DIAGNOSIS — R41840 Attention and concentration deficit: Secondary | ICD-10-CM | POA: Diagnosis not present

## 2021-01-26 DIAGNOSIS — R41841 Cognitive communication deficit: Secondary | ICD-10-CM | POA: Diagnosis not present

## 2021-01-26 DIAGNOSIS — E871 Hypo-osmolality and hyponatremia: Secondary | ICD-10-CM | POA: Diagnosis not present

## 2021-01-26 LAB — COMPREHENSIVE METABOLIC PANEL: Calcium: 8.7 (ref 8.7–10.7)

## 2021-01-26 LAB — BASIC METABOLIC PANEL
BUN: 11 (ref 4–21)
CO2: 24 — AB (ref 13–22)
Chloride: 104 (ref 99–108)
Creatinine: 0.5 (ref 0.5–1.1)
Glucose: 96
Potassium: 4.5 (ref 3.4–5.3)
Sodium: 138 (ref 137–147)

## 2021-01-26 LAB — CBC AND DIFFERENTIAL
HCT: 30 — AB (ref 36–46)
Hemoglobin: 9.4 — AB (ref 12.0–16.0)
Platelets: 327 (ref 150–399)
WBC: 5.1

## 2021-01-26 LAB — CBC: RBC: 3.07 — AB (ref 3.87–5.11)

## 2021-01-27 DIAGNOSIS — M66862 Spontaneous rupture of other tendons, left lower leg: Secondary | ICD-10-CM | POA: Diagnosis not present

## 2021-01-27 DIAGNOSIS — M62562 Muscle wasting and atrophy, not elsewhere classified, left lower leg: Secondary | ICD-10-CM | POA: Diagnosis not present

## 2021-01-27 DIAGNOSIS — Z471 Aftercare following joint replacement surgery: Secondary | ICD-10-CM | POA: Diagnosis not present

## 2021-01-27 DIAGNOSIS — R413 Other amnesia: Secondary | ICD-10-CM | POA: Diagnosis not present

## 2021-01-27 DIAGNOSIS — M1712 Unilateral primary osteoarthritis, left knee: Secondary | ICD-10-CM | POA: Diagnosis not present

## 2021-01-27 DIAGNOSIS — R278 Other lack of coordination: Secondary | ICD-10-CM | POA: Diagnosis not present

## 2021-01-27 DIAGNOSIS — R41840 Attention and concentration deficit: Secondary | ICD-10-CM | POA: Diagnosis not present

## 2021-01-27 DIAGNOSIS — Z96652 Presence of left artificial knee joint: Secondary | ICD-10-CM | POA: Diagnosis not present

## 2021-01-27 DIAGNOSIS — Z4789 Encounter for other orthopedic aftercare: Secondary | ICD-10-CM | POA: Diagnosis not present

## 2021-01-27 DIAGNOSIS — R41841 Cognitive communication deficit: Secondary | ICD-10-CM | POA: Diagnosis not present

## 2021-01-27 DIAGNOSIS — R2689 Other abnormalities of gait and mobility: Secondary | ICD-10-CM | POA: Diagnosis not present

## 2021-01-30 DIAGNOSIS — R41840 Attention and concentration deficit: Secondary | ICD-10-CM | POA: Diagnosis not present

## 2021-01-30 DIAGNOSIS — Z471 Aftercare following joint replacement surgery: Secondary | ICD-10-CM | POA: Diagnosis not present

## 2021-01-30 DIAGNOSIS — R2689 Other abnormalities of gait and mobility: Secondary | ICD-10-CM | POA: Diagnosis not present

## 2021-01-30 DIAGNOSIS — R41841 Cognitive communication deficit: Secondary | ICD-10-CM | POA: Diagnosis not present

## 2021-01-30 DIAGNOSIS — M62562 Muscle wasting and atrophy, not elsewhere classified, left lower leg: Secondary | ICD-10-CM | POA: Diagnosis not present

## 2021-01-30 DIAGNOSIS — M1712 Unilateral primary osteoarthritis, left knee: Secondary | ICD-10-CM | POA: Diagnosis not present

## 2021-01-30 DIAGNOSIS — R278 Other lack of coordination: Secondary | ICD-10-CM | POA: Diagnosis not present

## 2021-01-30 DIAGNOSIS — Z4789 Encounter for other orthopedic aftercare: Secondary | ICD-10-CM | POA: Diagnosis not present

## 2021-01-30 DIAGNOSIS — M66862 Spontaneous rupture of other tendons, left lower leg: Secondary | ICD-10-CM | POA: Diagnosis not present

## 2021-01-30 DIAGNOSIS — Z96652 Presence of left artificial knee joint: Secondary | ICD-10-CM | POA: Diagnosis not present

## 2021-01-30 DIAGNOSIS — R413 Other amnesia: Secondary | ICD-10-CM | POA: Diagnosis not present

## 2021-01-31 DIAGNOSIS — M66862 Spontaneous rupture of other tendons, left lower leg: Secondary | ICD-10-CM | POA: Diagnosis not present

## 2021-01-31 DIAGNOSIS — R278 Other lack of coordination: Secondary | ICD-10-CM | POA: Diagnosis not present

## 2021-01-31 DIAGNOSIS — R41840 Attention and concentration deficit: Secondary | ICD-10-CM | POA: Diagnosis not present

## 2021-01-31 DIAGNOSIS — R41841 Cognitive communication deficit: Secondary | ICD-10-CM | POA: Diagnosis not present

## 2021-01-31 DIAGNOSIS — M62562 Muscle wasting and atrophy, not elsewhere classified, left lower leg: Secondary | ICD-10-CM | POA: Diagnosis not present

## 2021-01-31 DIAGNOSIS — Z4789 Encounter for other orthopedic aftercare: Secondary | ICD-10-CM | POA: Diagnosis not present

## 2021-01-31 DIAGNOSIS — M1712 Unilateral primary osteoarthritis, left knee: Secondary | ICD-10-CM | POA: Diagnosis not present

## 2021-01-31 DIAGNOSIS — R413 Other amnesia: Secondary | ICD-10-CM | POA: Diagnosis not present

## 2021-01-31 DIAGNOSIS — R2689 Other abnormalities of gait and mobility: Secondary | ICD-10-CM | POA: Diagnosis not present

## 2021-01-31 DIAGNOSIS — Z471 Aftercare following joint replacement surgery: Secondary | ICD-10-CM | POA: Diagnosis not present

## 2021-01-31 DIAGNOSIS — Z96652 Presence of left artificial knee joint: Secondary | ICD-10-CM | POA: Diagnosis not present

## 2021-02-01 DIAGNOSIS — R278 Other lack of coordination: Secondary | ICD-10-CM | POA: Diagnosis not present

## 2021-02-01 DIAGNOSIS — R41841 Cognitive communication deficit: Secondary | ICD-10-CM | POA: Diagnosis not present

## 2021-02-01 DIAGNOSIS — M62562 Muscle wasting and atrophy, not elsewhere classified, left lower leg: Secondary | ICD-10-CM | POA: Diagnosis not present

## 2021-02-01 DIAGNOSIS — R2689 Other abnormalities of gait and mobility: Secondary | ICD-10-CM | POA: Diagnosis not present

## 2021-02-01 DIAGNOSIS — Z96652 Presence of left artificial knee joint: Secondary | ICD-10-CM | POA: Diagnosis not present

## 2021-02-01 DIAGNOSIS — Z4789 Encounter for other orthopedic aftercare: Secondary | ICD-10-CM | POA: Diagnosis not present

## 2021-02-01 DIAGNOSIS — M66862 Spontaneous rupture of other tendons, left lower leg: Secondary | ICD-10-CM | POA: Diagnosis not present

## 2021-02-01 DIAGNOSIS — M1712 Unilateral primary osteoarthritis, left knee: Secondary | ICD-10-CM | POA: Diagnosis not present

## 2021-02-01 DIAGNOSIS — Z471 Aftercare following joint replacement surgery: Secondary | ICD-10-CM | POA: Diagnosis not present

## 2021-02-01 DIAGNOSIS — R41840 Attention and concentration deficit: Secondary | ICD-10-CM | POA: Diagnosis not present

## 2021-02-01 DIAGNOSIS — R413 Other amnesia: Secondary | ICD-10-CM | POA: Diagnosis not present

## 2021-02-02 DIAGNOSIS — R2689 Other abnormalities of gait and mobility: Secondary | ICD-10-CM | POA: Diagnosis not present

## 2021-02-02 DIAGNOSIS — Z4789 Encounter for other orthopedic aftercare: Secondary | ICD-10-CM | POA: Diagnosis not present

## 2021-02-02 DIAGNOSIS — M66862 Spontaneous rupture of other tendons, left lower leg: Secondary | ICD-10-CM | POA: Diagnosis not present

## 2021-02-02 DIAGNOSIS — R41841 Cognitive communication deficit: Secondary | ICD-10-CM | POA: Diagnosis not present

## 2021-02-02 DIAGNOSIS — R413 Other amnesia: Secondary | ICD-10-CM | POA: Diagnosis not present

## 2021-02-02 DIAGNOSIS — M1712 Unilateral primary osteoarthritis, left knee: Secondary | ICD-10-CM | POA: Diagnosis not present

## 2021-02-02 DIAGNOSIS — R41840 Attention and concentration deficit: Secondary | ICD-10-CM | POA: Diagnosis not present

## 2021-02-02 DIAGNOSIS — M62562 Muscle wasting and atrophy, not elsewhere classified, left lower leg: Secondary | ICD-10-CM | POA: Diagnosis not present

## 2021-02-02 DIAGNOSIS — R278 Other lack of coordination: Secondary | ICD-10-CM | POA: Diagnosis not present

## 2021-02-02 DIAGNOSIS — Z471 Aftercare following joint replacement surgery: Secondary | ICD-10-CM | POA: Diagnosis not present

## 2021-02-02 DIAGNOSIS — Z96652 Presence of left artificial knee joint: Secondary | ICD-10-CM | POA: Diagnosis not present

## 2021-02-03 DIAGNOSIS — M62562 Muscle wasting and atrophy, not elsewhere classified, left lower leg: Secondary | ICD-10-CM | POA: Diagnosis not present

## 2021-02-03 DIAGNOSIS — M1712 Unilateral primary osteoarthritis, left knee: Secondary | ICD-10-CM | POA: Diagnosis not present

## 2021-02-03 DIAGNOSIS — R278 Other lack of coordination: Secondary | ICD-10-CM | POA: Diagnosis not present

## 2021-02-03 DIAGNOSIS — R41840 Attention and concentration deficit: Secondary | ICD-10-CM | POA: Diagnosis not present

## 2021-02-03 DIAGNOSIS — Z96652 Presence of left artificial knee joint: Secondary | ICD-10-CM | POA: Diagnosis not present

## 2021-02-03 DIAGNOSIS — R41841 Cognitive communication deficit: Secondary | ICD-10-CM | POA: Diagnosis not present

## 2021-02-03 DIAGNOSIS — R413 Other amnesia: Secondary | ICD-10-CM | POA: Diagnosis not present

## 2021-02-03 DIAGNOSIS — Z471 Aftercare following joint replacement surgery: Secondary | ICD-10-CM | POA: Diagnosis not present

## 2021-02-03 DIAGNOSIS — M66862 Spontaneous rupture of other tendons, left lower leg: Secondary | ICD-10-CM | POA: Diagnosis not present

## 2021-02-06 DIAGNOSIS — Z96652 Presence of left artificial knee joint: Secondary | ICD-10-CM | POA: Diagnosis not present

## 2021-02-06 DIAGNOSIS — M62562 Muscle wasting and atrophy, not elsewhere classified, left lower leg: Secondary | ICD-10-CM | POA: Diagnosis not present

## 2021-02-06 DIAGNOSIS — Z4789 Encounter for other orthopedic aftercare: Secondary | ICD-10-CM | POA: Diagnosis not present

## 2021-02-06 DIAGNOSIS — Z471 Aftercare following joint replacement surgery: Secondary | ICD-10-CM | POA: Diagnosis not present

## 2021-02-06 DIAGNOSIS — R41840 Attention and concentration deficit: Secondary | ICD-10-CM | POA: Diagnosis not present

## 2021-02-06 DIAGNOSIS — R278 Other lack of coordination: Secondary | ICD-10-CM | POA: Diagnosis not present

## 2021-02-06 DIAGNOSIS — M66862 Spontaneous rupture of other tendons, left lower leg: Secondary | ICD-10-CM | POA: Diagnosis not present

## 2021-02-06 DIAGNOSIS — R41841 Cognitive communication deficit: Secondary | ICD-10-CM | POA: Diagnosis not present

## 2021-02-06 DIAGNOSIS — R413 Other amnesia: Secondary | ICD-10-CM | POA: Diagnosis not present

## 2021-02-06 DIAGNOSIS — R2689 Other abnormalities of gait and mobility: Secondary | ICD-10-CM | POA: Diagnosis not present

## 2021-02-06 DIAGNOSIS — M1712 Unilateral primary osteoarthritis, left knee: Secondary | ICD-10-CM | POA: Diagnosis not present

## 2021-02-07 DIAGNOSIS — M1712 Unilateral primary osteoarthritis, left knee: Secondary | ICD-10-CM | POA: Diagnosis not present

## 2021-02-07 DIAGNOSIS — Z471 Aftercare following joint replacement surgery: Secondary | ICD-10-CM | POA: Diagnosis not present

## 2021-02-07 DIAGNOSIS — R278 Other lack of coordination: Secondary | ICD-10-CM | POA: Diagnosis not present

## 2021-02-07 DIAGNOSIS — R413 Other amnesia: Secondary | ICD-10-CM | POA: Diagnosis not present

## 2021-02-07 DIAGNOSIS — R2689 Other abnormalities of gait and mobility: Secondary | ICD-10-CM | POA: Diagnosis not present

## 2021-02-07 DIAGNOSIS — Z4789 Encounter for other orthopedic aftercare: Secondary | ICD-10-CM | POA: Diagnosis not present

## 2021-02-07 DIAGNOSIS — R41841 Cognitive communication deficit: Secondary | ICD-10-CM | POA: Diagnosis not present

## 2021-02-07 DIAGNOSIS — Z96652 Presence of left artificial knee joint: Secondary | ICD-10-CM | POA: Diagnosis not present

## 2021-02-07 DIAGNOSIS — M62562 Muscle wasting and atrophy, not elsewhere classified, left lower leg: Secondary | ICD-10-CM | POA: Diagnosis not present

## 2021-02-08 DIAGNOSIS — M1712 Unilateral primary osteoarthritis, left knee: Secondary | ICD-10-CM | POA: Diagnosis not present

## 2021-02-08 DIAGNOSIS — Z471 Aftercare following joint replacement surgery: Secondary | ICD-10-CM | POA: Diagnosis not present

## 2021-02-08 DIAGNOSIS — R2689 Other abnormalities of gait and mobility: Secondary | ICD-10-CM | POA: Diagnosis not present

## 2021-02-08 DIAGNOSIS — R278 Other lack of coordination: Secondary | ICD-10-CM | POA: Diagnosis not present

## 2021-02-08 DIAGNOSIS — R413 Other amnesia: Secondary | ICD-10-CM | POA: Diagnosis not present

## 2021-02-08 DIAGNOSIS — Z4789 Encounter for other orthopedic aftercare: Secondary | ICD-10-CM | POA: Diagnosis not present

## 2021-02-08 DIAGNOSIS — M62562 Muscle wasting and atrophy, not elsewhere classified, left lower leg: Secondary | ICD-10-CM | POA: Diagnosis not present

## 2021-02-08 DIAGNOSIS — R41841 Cognitive communication deficit: Secondary | ICD-10-CM | POA: Diagnosis not present

## 2021-02-08 DIAGNOSIS — Z96652 Presence of left artificial knee joint: Secondary | ICD-10-CM | POA: Diagnosis not present

## 2021-02-09 ENCOUNTER — Non-Acute Institutional Stay (SKILLED_NURSING_FACILITY): Payer: Medicare Other | Admitting: Adult Health

## 2021-02-09 ENCOUNTER — Encounter: Payer: Self-pay | Admitting: Adult Health

## 2021-02-09 DIAGNOSIS — M1712 Unilateral primary osteoarthritis, left knee: Secondary | ICD-10-CM | POA: Diagnosis not present

## 2021-02-09 DIAGNOSIS — R413 Other amnesia: Secondary | ICD-10-CM

## 2021-02-09 DIAGNOSIS — S86812S Strain of other muscle(s) and tendon(s) at lower leg level, left leg, sequela: Secondary | ICD-10-CM | POA: Diagnosis not present

## 2021-02-09 DIAGNOSIS — Z471 Aftercare following joint replacement surgery: Secondary | ICD-10-CM | POA: Diagnosis not present

## 2021-02-09 DIAGNOSIS — Z96652 Presence of left artificial knee joint: Secondary | ICD-10-CM

## 2021-02-09 DIAGNOSIS — D508 Other iron deficiency anemias: Secondary | ICD-10-CM | POA: Diagnosis not present

## 2021-02-09 DIAGNOSIS — I10 Essential (primary) hypertension: Secondary | ICD-10-CM | POA: Diagnosis not present

## 2021-02-09 DIAGNOSIS — Z4789 Encounter for other orthopedic aftercare: Secondary | ICD-10-CM | POA: Diagnosis not present

## 2021-02-09 DIAGNOSIS — R2689 Other abnormalities of gait and mobility: Secondary | ICD-10-CM | POA: Diagnosis not present

## 2021-02-09 DIAGNOSIS — R41841 Cognitive communication deficit: Secondary | ICD-10-CM | POA: Diagnosis not present

## 2021-02-09 DIAGNOSIS — R278 Other lack of coordination: Secondary | ICD-10-CM | POA: Diagnosis not present

## 2021-02-09 DIAGNOSIS — M66862 Spontaneous rupture of other tendons, left lower leg: Secondary | ICD-10-CM | POA: Diagnosis not present

## 2021-02-09 DIAGNOSIS — M62562 Muscle wasting and atrophy, not elsewhere classified, left lower leg: Secondary | ICD-10-CM | POA: Diagnosis not present

## 2021-02-09 DIAGNOSIS — R41840 Attention and concentration deficit: Secondary | ICD-10-CM | POA: Diagnosis not present

## 2021-02-09 NOTE — Progress Notes (Signed)
Location:   [facility] Room Number: 154-A Place of Service:  SNF [phone]) Provider:  Royal Hawthorn, NP    Patient Care Team: Merrilee Seashore, MD as PCP - General (Internal Medicine)  Extended Emergency Contact Information Primary Emergency Contact: Winquist,[name] Address: [address]          [place], [place] [address] [place] Phone: [phone] Mobile Phone: [phone] Relation: Spouse Secondary Emergency Contact: Carol,Steve Mobile Phone: [phone] Relation: Son Interpreter needed? No  Code Status:  DNR Goals of care: Advanced Directive information Advanced Directives 02/09/2021  Does Patient Have a Medical Advance Directive? Yes  Type of Paramedic of [place];Living will  Does patient want to make changes to medical advance directive? No - Patient declined  Copy of [place] in Chart? Yes - validated most recent copy scanned in chart (See row information)  Would patient like information on creating a medical advance directive? -  Pre-existing out of facility DNR order (yellow form or pink MOST form) -     Chief Complaint  Patient presents with   Medical Management of Chronic Issues    Routine Visit.   Health Maintenance    Discuss the need for Dexa Scan.   Immunizations    Discuss the need for Tetanus vaccine, and 5th Covid Booster.    HPI:  Pt is a 86 y.o. female seen today for medical management of chronic diseases.   S/p admission from 12/12/20-12/14/20 for left knee OA s/p let total knee arthroplasty. Later she had a patellar tendon rupture and needed a repair on 11/21. She is continuing to work with therapy and make progress. She is WBAT but needs to wear the knee immobilizer at all times. She is not having any pain but does report some swelling and warmth to the left knee which has been there since admission. She saw ortho on 12/6 and will f/u next  month. She remains on iron Hgb 9.4 01/26/21.  She did have low sodium upon her first admission and required hospitalization but this resolved. Last NA 138.   Her goal is to return home 1/1.  She is still not able to transfer well and take a bath/shower.  We have also noticed some memory loss MMSE 11/26 26/30 with passed clock. Has poor recall.  Of note she had an MRI of the brain in March of 2022 for dizziness and gait instability. Only mild age related changes were found with small vessel disease and cerebral atropy  Past Medical History:  Diagnosis Date   Dementia ([place])    Gait abnormality 06/20/2020   Hypertension    Hyponatremia    Iron deficiency anemia    Major depression    OA (osteoarthritis)    Pre-diabetes    Vitamin B 12 deficiency    Past Surgical History:  Procedure Laterality Date   ABDOMINAL HYSTERECTOMY     COLONOSCOPY     HEMANGIOMA EXCISION     JOINT REPLACEMENT     PATELLAR TENDON REPAIR Left 01/16/2021   Procedure: Left patellar tendon reconstruction;  Surgeon: Gaynelle Arabian, MD;  Location: [facility];  Service: Orthopedics;  Laterality: Left;   TONSILLECTOMY     TOTAL HIP ARTHROPLASTY Right 02/18/2019   Procedure: TOTAL HIP ARTHROPLASTY ANTERIOR APPROACH;  Surgeon: Gaynelle Arabian, MD;  Location: [facility];  Service: Orthopedics;  Laterality: Right;  191min   TOTAL KNEE ARTHROPLASTY Right 05/27/2012   Procedure: TOTAL KNEE ARTHROPLASTY;  Surgeon: Belenda Cruise  Alphonzo Severance, MD;  Location: [facility];  Service: Orthopedics;  Laterality: Right;  Right Total Knee Arthroplasty   TOTAL KNEE ARTHROPLASTY Left 12/12/2020   Procedure: TOTAL KNEE ARTHROPLASTY;  Surgeon: Gaynelle Arabian, MD;  [facility];  Service: Orthopedics;  Laterality: Left;   TOTAL KNEE REVISION Right 08/06/2018   Procedure: TOTAL KNEE REVISION;  Surgeon: Gaynelle Arabian, MD;  [facility]: [facility];  Service: Orthopedics;  Laterality: Right;   TUBAL LIGATION      No Known Allergies  Allergies as of 02/09/2021   No  Known Allergies      Medication List        Accurate as of February 09, 2021  9:16 AM. If you have any questions, ask your nurse or doctor.          STOP taking these medications    lactose free nutrition Liqd Stopped by: Royal Hawthorn, NP   traMADol 50 MG tablet Commonly known as: ULTRAM Stopped by: Royal Hawthorn, NP       TAKE these medications    acetaminophen 500 MG tablet Commonly known as: TYLENOL Take 500 mg by mouth every 6 (six) hours as needed for moderate pain.   acetaminophen 325 MG tablet Commonly known as: TYLENOL Take 650 mg by mouth 2 (two) times daily.   amLODipine 5 MG tablet Commonly known as: NORVASC Take 5 mg by mouth in the morning.   aspirin EC 81 MG tablet Take 81 mg by mouth daily.   B-12 2500 MCG Tabs Take 1 tablet by mouth daily.   cholecalciferol 25 MCG (1000 UNIT) tablet Commonly known as: VITAMIN D3 Take 1,000 Units by mouth daily.   Combigan 0.2-0.5 % ophthalmic solution Generic drug: brimonidine-timolol Place 1 drop into the right eye in the morning and at bedtime.   dorzolamide 2 % ophthalmic solution Commonly known as: TRUSOPT Place 1 drop into the right eye 2 (two) times daily.   ferrous sulfate 325 (65 FE) MG tablet Take 325 mg by mouth daily with breakfast.   latanoprost 0.005 % ophthalmic solution Commonly known as: XALATAN Place 1 drop into both eyes at bedtime.   methocarbamol 500 MG tablet Commonly known as: ROBAXIN Take 250 mg by mouth every 6 (six) hours as needed for muscle spasms.   olmesartan 40 MG tablet Commonly known as: BENICAR Take 1 tablet (40 mg total) by mouth daily.   polyethylene glycol 17 g packet Commonly known as: MIRALAX / GLYCOLAX Take 17 g by mouth daily. In the morning between 8-11 am   PRESERVISION AREDS 2 PO Take 1 capsule by mouth in the morning and at bedtime.        Review of Systems  Constitutional:  Positive for activity change. Negative for appetite change,  chills, diaphoresis, fatigue, fever and unexpected weight change.  HENT:  Negative for congestion.   Respiratory:  Negative for cough, shortness of breath and wheezing.   Cardiovascular:  Negative for chest pain, palpitations and leg swelling.  Gastrointestinal:  Negative for abdominal distention, abdominal pain, constipation and diarrhea.  Genitourinary:  Negative for difficulty urinating and dysuria.  Musculoskeletal:  Positive for gait problem and joint swelling. Negative for arthralgias, back pain and myalgias.  Neurological:  Negative for dizziness, tremors, seizures, syncope, facial asymmetry, speech difficulty, weakness, light-headedness, numbness and headaches.  Psychiatric/Behavioral:  Negative for agitation, behavioral problems and confusion.        Memory loss   Immunization History  Administered Date(s) Administered   Fluad Quad(high Dose 65+)  12/28/2019   Influenza, High Dose Seasonal PF 12/07/2016, 12/21/2017   Influenza, Quadrivalent, Recombinant, Inj, Pf 12/07/2017, 12/02/2020   Influenza,inj,quad, With Preservative 11/27/2018   Influenza-Unspecified 12/20/2011, 12/25/2012, 12/01/2013, 12/31/2013, 11/26/2017   Moderna SARS-COV2 Booster Vaccination 01/12/2020, 12/09/2020   Moderna Sars-Covid-2 Vaccination 03/10/2019, 04/07/2019, 06/06/2020   Pneumococcal Conjugate-13 02/06/2016   Pneumococcal Polysaccharide-23 02/18/2002, 02/11/2017   Td (Adult) 03/02/2011   Unspecified SARS-COV-2 Vaccination 03/11/2019   Zoster Recombinat (Shingrix) 02/15/2020, 06/01/2020   Pertinent  Health Maintenance Due  Topic Date Due   DEXA SCAN  Never done   INFLUENZA VACCINE  Completed   Fall Risk 01/16/2021 01/16/2021 01/17/2021 01/17/2021 01/18/2021  Patient Fall Risk Level High fall risk High fall risk High fall risk High fall risk High fall risk   Functional Status Survey:    Vitals:   02/09/21 0902  BP: 133/69  Pulse: 92  Resp: 14  Temp: 97.9 F (36.6 C)  SpO2: 97%  Weight:  120 lb 12.8 oz (54.8 kg)  Height: 5\' 4"  (1.626 m)   Body mass index is 20.74 kg/m. Physical Exam Vitals and nursing note reviewed.  Constitutional:      General: She is not in acute distress.    Appearance: She is not diaphoretic.  HENT:     Head: Normocephalic and atraumatic.  Neck:     Vascular: No JVD.  Cardiovascular:     Rate and Rhythm: Normal rate and regular rhythm.     Heart sounds: No murmur heard. Pulmonary:     Effort: Pulmonary effort is normal. No respiratory distress.     Breath sounds: Normal breath sounds. No wheezing.  Musculoskeletal:        General: Swelling (to left knee with mild warmth and erythema) present.     Right lower leg: No edema.     Left lower leg: No edema.     Comments: +CMS to LLE  Skin:    General: Skin is warm and dry.     Comments: Left knee incision healed.   Neurological:     Mental Status: She is alert and oriented to person, place, and time.    Labs reviewed: Recent Labs    12/21/20 1721 12/22/20 1218 12/23/20 0433 12/26/20 0000 01/17/21 0310 01/18/21 0320 01/26/21 0000  NA 125*   < > 129*   < > 136 132* 138  K 4.6   < > 3.7   < > 3.5 3.3* 4.5  CL 94*   < > 95*   < > 103 100 104  CO2 24   < > 23   < > 28 27 24*  GLUCOSE 119*   < > 108*  --  90 103*  --   BUN 13   < > 14   < > 9 10 11   CREATININE 0.85   < > 0.75   < > 0.50 0.51 0.5  CALCIUM 8.5*   < > 8.6*   < > 8.2* 8.2* 8.7  MG 1.8  --   --   --   --   --   --   PHOS 2.2*  --   --   --   --   --   --    < > = values in this interval not displayed.   Recent Labs    11/23/20 1434 12/02/20 1443 12/21/20 1721 12/29/20 0000  AST 11* 14* 21 16  ALT 10 12 27 15   ALKPHOS 81 66 122 130*  BILITOT 0.4 0.3  0.5  --   PROT 7.2 7.1 6.9  --   ALBUMIN 3.2* 3.4* 3.4* 3.7   Recent Labs    11/23/20 1434 12/02/20 1443 12/21/20 1721 12/29/20 0000 01/17/21 0310 01/18/21 0320 01/26/21 0000  WBC 6.3   < > 11.6*   < > 6.5 6.5 5.1  NEUTROABS 3.9  --  9.2*  --   --   --   --    HGB 9.8*   < > 10.4*   < > 9.4* 8.3* 9.4*  HCT 29.6*   < > 31.9*   < > 28.4* 25.6* 30*  MCV 92.5   < > 94.4  --  97.3 97.0  --   PLT 347   < > 349   < > 228 214 327   < > = values in this interval not displayed.   Lab Results  Component Value Date   TSH 1.488 12/21/2020   No results found for: HGBA1C No results found for: CHOL, HDL, LDLCALC, LDLDIRECT, TRIG, CHOLHDL  Significant Diagnostic Results in last 30 days:  No results found.  Assessment/Plan  1. Primary osteoarthritis of left knee Led to #2  2. Status post total left knee replacement Doing well  WBAT Working with PT and OT Using tylenol bid  Could go home with help from her husband once therapy feels she is ready in terms of mobility and self care.  On daily asa  3. S/P Repair Patellar tendon rupture, left, sequela Continues with knee immobilizer F/U with ortho next month   4. Other iron deficiency anemia Followed by oncology and receives B12 and iron infusions periodically  Continue oral iron  5. Short-term memory loss Noted  Complicated by two surgeries with anesthesia  Continue to monitor  6. Essential hypertension Controlled with norvasc Off hctz due to low sodium    Family/ staff Communication: resident   Labs/tests ordered:  would need CBC and BMP in follow up visit if she discharges in Jan

## 2021-02-10 DIAGNOSIS — M62562 Muscle wasting and atrophy, not elsewhere classified, left lower leg: Secondary | ICD-10-CM | POA: Diagnosis not present

## 2021-02-10 DIAGNOSIS — Z471 Aftercare following joint replacement surgery: Secondary | ICD-10-CM | POA: Diagnosis not present

## 2021-02-10 DIAGNOSIS — R41840 Attention and concentration deficit: Secondary | ICD-10-CM | POA: Diagnosis not present

## 2021-02-10 DIAGNOSIS — R278 Other lack of coordination: Secondary | ICD-10-CM | POA: Diagnosis not present

## 2021-02-10 DIAGNOSIS — R2689 Other abnormalities of gait and mobility: Secondary | ICD-10-CM | POA: Diagnosis not present

## 2021-02-10 DIAGNOSIS — M1712 Unilateral primary osteoarthritis, left knee: Secondary | ICD-10-CM | POA: Diagnosis not present

## 2021-02-10 DIAGNOSIS — R413 Other amnesia: Secondary | ICD-10-CM | POA: Diagnosis not present

## 2021-02-10 DIAGNOSIS — M66862 Spontaneous rupture of other tendons, left lower leg: Secondary | ICD-10-CM | POA: Diagnosis not present

## 2021-02-10 DIAGNOSIS — R41841 Cognitive communication deficit: Secondary | ICD-10-CM | POA: Diagnosis not present

## 2021-02-10 DIAGNOSIS — Z4789 Encounter for other orthopedic aftercare: Secondary | ICD-10-CM | POA: Diagnosis not present

## 2021-02-10 DIAGNOSIS — Z96652 Presence of left artificial knee joint: Secondary | ICD-10-CM | POA: Diagnosis not present

## 2021-02-13 DIAGNOSIS — R278 Other lack of coordination: Secondary | ICD-10-CM | POA: Diagnosis not present

## 2021-02-13 DIAGNOSIS — R41841 Cognitive communication deficit: Secondary | ICD-10-CM | POA: Diagnosis not present

## 2021-02-13 DIAGNOSIS — M62562 Muscle wasting and atrophy, not elsewhere classified, left lower leg: Secondary | ICD-10-CM | POA: Diagnosis not present

## 2021-02-13 DIAGNOSIS — R2689 Other abnormalities of gait and mobility: Secondary | ICD-10-CM | POA: Diagnosis not present

## 2021-02-13 DIAGNOSIS — M1712 Unilateral primary osteoarthritis, left knee: Secondary | ICD-10-CM | POA: Diagnosis not present

## 2021-02-13 DIAGNOSIS — R413 Other amnesia: Secondary | ICD-10-CM | POA: Diagnosis not present

## 2021-02-13 DIAGNOSIS — R41840 Attention and concentration deficit: Secondary | ICD-10-CM | POA: Diagnosis not present

## 2021-02-13 DIAGNOSIS — M66862 Spontaneous rupture of other tendons, left lower leg: Secondary | ICD-10-CM | POA: Diagnosis not present

## 2021-02-13 DIAGNOSIS — Z96652 Presence of left artificial knee joint: Secondary | ICD-10-CM | POA: Diagnosis not present

## 2021-02-13 DIAGNOSIS — Z471 Aftercare following joint replacement surgery: Secondary | ICD-10-CM | POA: Diagnosis not present

## 2021-02-13 DIAGNOSIS — Z4789 Encounter for other orthopedic aftercare: Secondary | ICD-10-CM | POA: Diagnosis not present

## 2021-02-14 DIAGNOSIS — Z471 Aftercare following joint replacement surgery: Secondary | ICD-10-CM | POA: Diagnosis not present

## 2021-02-14 DIAGNOSIS — M1712 Unilateral primary osteoarthritis, left knee: Secondary | ICD-10-CM | POA: Diagnosis not present

## 2021-02-14 DIAGNOSIS — R2689 Other abnormalities of gait and mobility: Secondary | ICD-10-CM | POA: Diagnosis not present

## 2021-02-14 DIAGNOSIS — Z4789 Encounter for other orthopedic aftercare: Secondary | ICD-10-CM | POA: Diagnosis not present

## 2021-02-14 DIAGNOSIS — M66862 Spontaneous rupture of other tendons, left lower leg: Secondary | ICD-10-CM | POA: Diagnosis not present

## 2021-02-14 DIAGNOSIS — M62562 Muscle wasting and atrophy, not elsewhere classified, left lower leg: Secondary | ICD-10-CM | POA: Diagnosis not present

## 2021-02-14 DIAGNOSIS — R41840 Attention and concentration deficit: Secondary | ICD-10-CM | POA: Diagnosis not present

## 2021-02-14 DIAGNOSIS — R278 Other lack of coordination: Secondary | ICD-10-CM | POA: Diagnosis not present

## 2021-02-14 DIAGNOSIS — Z96652 Presence of left artificial knee joint: Secondary | ICD-10-CM | POA: Diagnosis not present

## 2021-02-15 DIAGNOSIS — R278 Other lack of coordination: Secondary | ICD-10-CM | POA: Diagnosis not present

## 2021-02-15 DIAGNOSIS — R413 Other amnesia: Secondary | ICD-10-CM | POA: Diagnosis not present

## 2021-02-15 DIAGNOSIS — Z4789 Encounter for other orthopedic aftercare: Secondary | ICD-10-CM | POA: Diagnosis not present

## 2021-02-15 DIAGNOSIS — R2689 Other abnormalities of gait and mobility: Secondary | ICD-10-CM | POA: Diagnosis not present

## 2021-02-15 DIAGNOSIS — R41840 Attention and concentration deficit: Secondary | ICD-10-CM | POA: Diagnosis not present

## 2021-02-15 DIAGNOSIS — Z96652 Presence of left artificial knee joint: Secondary | ICD-10-CM | POA: Diagnosis not present

## 2021-02-15 DIAGNOSIS — M1712 Unilateral primary osteoarthritis, left knee: Secondary | ICD-10-CM | POA: Diagnosis not present

## 2021-02-15 DIAGNOSIS — M62562 Muscle wasting and atrophy, not elsewhere classified, left lower leg: Secondary | ICD-10-CM | POA: Diagnosis not present

## 2021-02-15 DIAGNOSIS — Z471 Aftercare following joint replacement surgery: Secondary | ICD-10-CM | POA: Diagnosis not present

## 2021-02-15 DIAGNOSIS — M66862 Spontaneous rupture of other tendons, left lower leg: Secondary | ICD-10-CM | POA: Diagnosis not present

## 2021-02-15 DIAGNOSIS — R41841 Cognitive communication deficit: Secondary | ICD-10-CM | POA: Diagnosis not present

## 2021-02-16 DIAGNOSIS — Z96652 Presence of left artificial knee joint: Secondary | ICD-10-CM | POA: Diagnosis not present

## 2021-02-16 DIAGNOSIS — R413 Other amnesia: Secondary | ICD-10-CM | POA: Diagnosis not present

## 2021-02-16 DIAGNOSIS — M62562 Muscle wasting and atrophy, not elsewhere classified, left lower leg: Secondary | ICD-10-CM | POA: Diagnosis not present

## 2021-02-16 DIAGNOSIS — R278 Other lack of coordination: Secondary | ICD-10-CM | POA: Diagnosis not present

## 2021-02-16 DIAGNOSIS — R2689 Other abnormalities of gait and mobility: Secondary | ICD-10-CM | POA: Diagnosis not present

## 2021-02-16 DIAGNOSIS — Z4789 Encounter for other orthopedic aftercare: Secondary | ICD-10-CM | POA: Diagnosis not present

## 2021-02-16 DIAGNOSIS — R41841 Cognitive communication deficit: Secondary | ICD-10-CM | POA: Diagnosis not present

## 2021-02-16 DIAGNOSIS — M1712 Unilateral primary osteoarthritis, left knee: Secondary | ICD-10-CM | POA: Diagnosis not present

## 2021-02-16 DIAGNOSIS — Z471 Aftercare following joint replacement surgery: Secondary | ICD-10-CM | POA: Diagnosis not present

## 2021-02-17 DIAGNOSIS — M66862 Spontaneous rupture of other tendons, left lower leg: Secondary | ICD-10-CM | POA: Diagnosis not present

## 2021-02-17 DIAGNOSIS — Z471 Aftercare following joint replacement surgery: Secondary | ICD-10-CM | POA: Diagnosis not present

## 2021-02-17 DIAGNOSIS — R41841 Cognitive communication deficit: Secondary | ICD-10-CM | POA: Diagnosis not present

## 2021-02-17 DIAGNOSIS — M1712 Unilateral primary osteoarthritis, left knee: Secondary | ICD-10-CM | POA: Diagnosis not present

## 2021-02-17 DIAGNOSIS — R278 Other lack of coordination: Secondary | ICD-10-CM | POA: Diagnosis not present

## 2021-02-17 DIAGNOSIS — R41840 Attention and concentration deficit: Secondary | ICD-10-CM | POA: Diagnosis not present

## 2021-02-17 DIAGNOSIS — M62562 Muscle wasting and atrophy, not elsewhere classified, left lower leg: Secondary | ICD-10-CM | POA: Diagnosis not present

## 2021-02-17 DIAGNOSIS — R2689 Other abnormalities of gait and mobility: Secondary | ICD-10-CM | POA: Diagnosis not present

## 2021-02-17 DIAGNOSIS — Z4789 Encounter for other orthopedic aftercare: Secondary | ICD-10-CM | POA: Diagnosis not present

## 2021-02-17 DIAGNOSIS — Z96652 Presence of left artificial knee joint: Secondary | ICD-10-CM | POA: Diagnosis not present

## 2021-02-17 DIAGNOSIS — R413 Other amnesia: Secondary | ICD-10-CM | POA: Diagnosis not present

## 2021-02-21 DIAGNOSIS — M62562 Muscle wasting and atrophy, not elsewhere classified, left lower leg: Secondary | ICD-10-CM | POA: Diagnosis not present

## 2021-02-21 DIAGNOSIS — M66862 Spontaneous rupture of other tendons, left lower leg: Secondary | ICD-10-CM | POA: Diagnosis not present

## 2021-02-21 DIAGNOSIS — R41840 Attention and concentration deficit: Secondary | ICD-10-CM | POA: Diagnosis not present

## 2021-02-21 DIAGNOSIS — Z96652 Presence of left artificial knee joint: Secondary | ICD-10-CM | POA: Diagnosis not present

## 2021-02-21 DIAGNOSIS — R278 Other lack of coordination: Secondary | ICD-10-CM | POA: Diagnosis not present

## 2021-02-21 DIAGNOSIS — Z471 Aftercare following joint replacement surgery: Secondary | ICD-10-CM | POA: Diagnosis not present

## 2021-02-21 DIAGNOSIS — M1712 Unilateral primary osteoarthritis, left knee: Secondary | ICD-10-CM | POA: Diagnosis not present

## 2021-02-22 DIAGNOSIS — M66862 Spontaneous rupture of other tendons, left lower leg: Secondary | ICD-10-CM | POA: Diagnosis not present

## 2021-02-22 DIAGNOSIS — Z96652 Presence of left artificial knee joint: Secondary | ICD-10-CM | POA: Diagnosis not present

## 2021-02-22 DIAGNOSIS — M1712 Unilateral primary osteoarthritis, left knee: Secondary | ICD-10-CM | POA: Diagnosis not present

## 2021-02-22 DIAGNOSIS — R41840 Attention and concentration deficit: Secondary | ICD-10-CM | POA: Diagnosis not present

## 2021-02-22 DIAGNOSIS — R278 Other lack of coordination: Secondary | ICD-10-CM | POA: Diagnosis not present

## 2021-02-22 DIAGNOSIS — Z471 Aftercare following joint replacement surgery: Secondary | ICD-10-CM | POA: Diagnosis not present

## 2021-02-22 DIAGNOSIS — M62562 Muscle wasting and atrophy, not elsewhere classified, left lower leg: Secondary | ICD-10-CM | POA: Diagnosis not present

## 2021-02-23 DIAGNOSIS — R413 Other amnesia: Secondary | ICD-10-CM | POA: Diagnosis not present

## 2021-02-23 DIAGNOSIS — Z96652 Presence of left artificial knee joint: Secondary | ICD-10-CM | POA: Diagnosis not present

## 2021-02-23 DIAGNOSIS — R41841 Cognitive communication deficit: Secondary | ICD-10-CM | POA: Diagnosis not present

## 2021-02-24 DIAGNOSIS — Z96652 Presence of left artificial knee joint: Secondary | ICD-10-CM | POA: Diagnosis not present

## 2021-02-24 DIAGNOSIS — R278 Other lack of coordination: Secondary | ICD-10-CM | POA: Diagnosis not present

## 2021-02-24 DIAGNOSIS — R413 Other amnesia: Secondary | ICD-10-CM | POA: Diagnosis not present

## 2021-02-24 DIAGNOSIS — R41840 Attention and concentration deficit: Secondary | ICD-10-CM | POA: Diagnosis not present

## 2021-02-24 DIAGNOSIS — M66862 Spontaneous rupture of other tendons, left lower leg: Secondary | ICD-10-CM | POA: Diagnosis not present

## 2021-02-24 DIAGNOSIS — R41841 Cognitive communication deficit: Secondary | ICD-10-CM | POA: Diagnosis not present

## 2021-02-24 DIAGNOSIS — M62562 Muscle wasting and atrophy, not elsewhere classified, left lower leg: Secondary | ICD-10-CM | POA: Diagnosis not present

## 2021-02-24 DIAGNOSIS — Z471 Aftercare following joint replacement surgery: Secondary | ICD-10-CM | POA: Diagnosis not present

## 2021-02-24 DIAGNOSIS — M1712 Unilateral primary osteoarthritis, left knee: Secondary | ICD-10-CM | POA: Diagnosis not present

## 2021-02-27 DIAGNOSIS — R413 Other amnesia: Secondary | ICD-10-CM | POA: Diagnosis not present

## 2021-02-27 DIAGNOSIS — R278 Other lack of coordination: Secondary | ICD-10-CM | POA: Diagnosis not present

## 2021-02-27 DIAGNOSIS — R41841 Cognitive communication deficit: Secondary | ICD-10-CM | POA: Diagnosis not present

## 2021-02-27 DIAGNOSIS — M66862 Spontaneous rupture of other tendons, left lower leg: Secondary | ICD-10-CM | POA: Diagnosis not present

## 2021-02-27 DIAGNOSIS — M62562 Muscle wasting and atrophy, not elsewhere classified, left lower leg: Secondary | ICD-10-CM | POA: Diagnosis not present

## 2021-02-27 DIAGNOSIS — R41840 Attention and concentration deficit: Secondary | ICD-10-CM | POA: Diagnosis not present

## 2021-02-27 DIAGNOSIS — Z471 Aftercare following joint replacement surgery: Secondary | ICD-10-CM | POA: Diagnosis not present

## 2021-02-27 DIAGNOSIS — M1712 Unilateral primary osteoarthritis, left knee: Secondary | ICD-10-CM | POA: Diagnosis not present

## 2021-02-27 DIAGNOSIS — Z96652 Presence of left artificial knee joint: Secondary | ICD-10-CM | POA: Diagnosis not present

## 2021-02-28 DIAGNOSIS — R278 Other lack of coordination: Secondary | ICD-10-CM | POA: Diagnosis not present

## 2021-02-28 DIAGNOSIS — Z471 Aftercare following joint replacement surgery: Secondary | ICD-10-CM | POA: Diagnosis not present

## 2021-02-28 DIAGNOSIS — R2689 Other abnormalities of gait and mobility: Secondary | ICD-10-CM | POA: Diagnosis not present

## 2021-02-28 DIAGNOSIS — Z4789 Encounter for other orthopedic aftercare: Secondary | ICD-10-CM | POA: Diagnosis not present

## 2021-02-28 DIAGNOSIS — M1712 Unilateral primary osteoarthritis, left knee: Secondary | ICD-10-CM | POA: Diagnosis not present

## 2021-02-28 DIAGNOSIS — M62562 Muscle wasting and atrophy, not elsewhere classified, left lower leg: Secondary | ICD-10-CM | POA: Diagnosis not present

## 2021-03-01 DIAGNOSIS — M66862 Spontaneous rupture of other tendons, left lower leg: Secondary | ICD-10-CM | POA: Diagnosis not present

## 2021-03-01 DIAGNOSIS — R41840 Attention and concentration deficit: Secondary | ICD-10-CM | POA: Diagnosis not present

## 2021-03-01 DIAGNOSIS — Z96652 Presence of left artificial knee joint: Secondary | ICD-10-CM | POA: Diagnosis not present

## 2021-03-01 DIAGNOSIS — R2689 Other abnormalities of gait and mobility: Secondary | ICD-10-CM | POA: Diagnosis not present

## 2021-03-01 DIAGNOSIS — Z471 Aftercare following joint replacement surgery: Secondary | ICD-10-CM | POA: Diagnosis not present

## 2021-03-01 DIAGNOSIS — M62562 Muscle wasting and atrophy, not elsewhere classified, left lower leg: Secondary | ICD-10-CM | POA: Diagnosis not present

## 2021-03-01 DIAGNOSIS — R413 Other amnesia: Secondary | ICD-10-CM | POA: Diagnosis not present

## 2021-03-01 DIAGNOSIS — R41841 Cognitive communication deficit: Secondary | ICD-10-CM | POA: Diagnosis not present

## 2021-03-01 DIAGNOSIS — Z4789 Encounter for other orthopedic aftercare: Secondary | ICD-10-CM | POA: Diagnosis not present

## 2021-03-01 DIAGNOSIS — M1712 Unilateral primary osteoarthritis, left knee: Secondary | ICD-10-CM | POA: Diagnosis not present

## 2021-03-01 DIAGNOSIS — R278 Other lack of coordination: Secondary | ICD-10-CM | POA: Diagnosis not present

## 2021-03-02 DIAGNOSIS — R2689 Other abnormalities of gait and mobility: Secondary | ICD-10-CM | POA: Diagnosis not present

## 2021-03-02 DIAGNOSIS — M62562 Muscle wasting and atrophy, not elsewhere classified, left lower leg: Secondary | ICD-10-CM | POA: Diagnosis not present

## 2021-03-02 DIAGNOSIS — M1712 Unilateral primary osteoarthritis, left knee: Secondary | ICD-10-CM | POA: Diagnosis not present

## 2021-03-02 DIAGNOSIS — R278 Other lack of coordination: Secondary | ICD-10-CM | POA: Diagnosis not present

## 2021-03-02 DIAGNOSIS — Z471 Aftercare following joint replacement surgery: Secondary | ICD-10-CM | POA: Diagnosis not present

## 2021-03-02 DIAGNOSIS — Z4789 Encounter for other orthopedic aftercare: Secondary | ICD-10-CM | POA: Diagnosis not present

## 2021-03-03 ENCOUNTER — Inpatient Hospital Stay: Payer: Medicare Other

## 2021-03-03 ENCOUNTER — Inpatient Hospital Stay: Payer: Medicare Other | Admitting: Hematology

## 2021-03-03 DIAGNOSIS — R2689 Other abnormalities of gait and mobility: Secondary | ICD-10-CM | POA: Diagnosis not present

## 2021-03-03 DIAGNOSIS — R41840 Attention and concentration deficit: Secondary | ICD-10-CM | POA: Diagnosis not present

## 2021-03-03 DIAGNOSIS — Z471 Aftercare following joint replacement surgery: Secondary | ICD-10-CM | POA: Diagnosis not present

## 2021-03-03 DIAGNOSIS — M1712 Unilateral primary osteoarthritis, left knee: Secondary | ICD-10-CM | POA: Diagnosis not present

## 2021-03-03 DIAGNOSIS — R41841 Cognitive communication deficit: Secondary | ICD-10-CM | POA: Diagnosis not present

## 2021-03-03 DIAGNOSIS — R413 Other amnesia: Secondary | ICD-10-CM | POA: Diagnosis not present

## 2021-03-03 DIAGNOSIS — M62562 Muscle wasting and atrophy, not elsewhere classified, left lower leg: Secondary | ICD-10-CM | POA: Diagnosis not present

## 2021-03-03 DIAGNOSIS — Z96652 Presence of left artificial knee joint: Secondary | ICD-10-CM | POA: Diagnosis not present

## 2021-03-03 DIAGNOSIS — M66862 Spontaneous rupture of other tendons, left lower leg: Secondary | ICD-10-CM | POA: Diagnosis not present

## 2021-03-03 DIAGNOSIS — R278 Other lack of coordination: Secondary | ICD-10-CM | POA: Diagnosis not present

## 2021-03-03 DIAGNOSIS — Z4789 Encounter for other orthopedic aftercare: Secondary | ICD-10-CM | POA: Diagnosis not present

## 2021-03-06 DIAGNOSIS — R278 Other lack of coordination: Secondary | ICD-10-CM | POA: Diagnosis not present

## 2021-03-06 DIAGNOSIS — R41840 Attention and concentration deficit: Secondary | ICD-10-CM | POA: Diagnosis not present

## 2021-03-06 DIAGNOSIS — R413 Other amnesia: Secondary | ICD-10-CM | POA: Diagnosis not present

## 2021-03-06 DIAGNOSIS — R2689 Other abnormalities of gait and mobility: Secondary | ICD-10-CM | POA: Diagnosis not present

## 2021-03-06 DIAGNOSIS — Z471 Aftercare following joint replacement surgery: Secondary | ICD-10-CM | POA: Diagnosis not present

## 2021-03-06 DIAGNOSIS — R41841 Cognitive communication deficit: Secondary | ICD-10-CM | POA: Diagnosis not present

## 2021-03-06 DIAGNOSIS — Z96652 Presence of left artificial knee joint: Secondary | ICD-10-CM | POA: Diagnosis not present

## 2021-03-06 DIAGNOSIS — Z4789 Encounter for other orthopedic aftercare: Secondary | ICD-10-CM | POA: Diagnosis not present

## 2021-03-06 DIAGNOSIS — M62562 Muscle wasting and atrophy, not elsewhere classified, left lower leg: Secondary | ICD-10-CM | POA: Diagnosis not present

## 2021-03-06 DIAGNOSIS — M66862 Spontaneous rupture of other tendons, left lower leg: Secondary | ICD-10-CM | POA: Diagnosis not present

## 2021-03-06 DIAGNOSIS — M1712 Unilateral primary osteoarthritis, left knee: Secondary | ICD-10-CM | POA: Diagnosis not present

## 2021-03-07 DIAGNOSIS — R413 Other amnesia: Secondary | ICD-10-CM | POA: Diagnosis not present

## 2021-03-07 DIAGNOSIS — Z4789 Encounter for other orthopedic aftercare: Secondary | ICD-10-CM | POA: Diagnosis not present

## 2021-03-07 DIAGNOSIS — M1712 Unilateral primary osteoarthritis, left knee: Secondary | ICD-10-CM | POA: Diagnosis not present

## 2021-03-07 DIAGNOSIS — Z96652 Presence of left artificial knee joint: Secondary | ICD-10-CM | POA: Diagnosis not present

## 2021-03-07 DIAGNOSIS — R41841 Cognitive communication deficit: Secondary | ICD-10-CM | POA: Diagnosis not present

## 2021-03-07 DIAGNOSIS — M62562 Muscle wasting and atrophy, not elsewhere classified, left lower leg: Secondary | ICD-10-CM | POA: Diagnosis not present

## 2021-03-07 DIAGNOSIS — R2689 Other abnormalities of gait and mobility: Secondary | ICD-10-CM | POA: Diagnosis not present

## 2021-03-07 DIAGNOSIS — Z471 Aftercare following joint replacement surgery: Secondary | ICD-10-CM | POA: Diagnosis not present

## 2021-03-07 DIAGNOSIS — R278 Other lack of coordination: Secondary | ICD-10-CM | POA: Diagnosis not present

## 2021-03-08 DIAGNOSIS — Z471 Aftercare following joint replacement surgery: Secondary | ICD-10-CM | POA: Diagnosis not present

## 2021-03-08 DIAGNOSIS — Z96652 Presence of left artificial knee joint: Secondary | ICD-10-CM | POA: Diagnosis not present

## 2021-03-08 DIAGNOSIS — M66862 Spontaneous rupture of other tendons, left lower leg: Secondary | ICD-10-CM | POA: Diagnosis not present

## 2021-03-08 DIAGNOSIS — R2689 Other abnormalities of gait and mobility: Secondary | ICD-10-CM | POA: Diagnosis not present

## 2021-03-08 DIAGNOSIS — Z4789 Encounter for other orthopedic aftercare: Secondary | ICD-10-CM | POA: Diagnosis not present

## 2021-03-08 DIAGNOSIS — M62562 Muscle wasting and atrophy, not elsewhere classified, left lower leg: Secondary | ICD-10-CM | POA: Diagnosis not present

## 2021-03-08 DIAGNOSIS — R41840 Attention and concentration deficit: Secondary | ICD-10-CM | POA: Diagnosis not present

## 2021-03-08 DIAGNOSIS — R278 Other lack of coordination: Secondary | ICD-10-CM | POA: Diagnosis not present

## 2021-03-08 DIAGNOSIS — M1712 Unilateral primary osteoarthritis, left knee: Secondary | ICD-10-CM | POA: Diagnosis not present

## 2021-03-09 ENCOUNTER — Telehealth: Payer: Self-pay | Admitting: Hematology

## 2021-03-09 DIAGNOSIS — M1712 Unilateral primary osteoarthritis, left knee: Secondary | ICD-10-CM | POA: Diagnosis not present

## 2021-03-09 DIAGNOSIS — M62562 Muscle wasting and atrophy, not elsewhere classified, left lower leg: Secondary | ICD-10-CM | POA: Diagnosis not present

## 2021-03-09 DIAGNOSIS — R413 Other amnesia: Secondary | ICD-10-CM | POA: Diagnosis not present

## 2021-03-09 DIAGNOSIS — Z471 Aftercare following joint replacement surgery: Secondary | ICD-10-CM | POA: Diagnosis not present

## 2021-03-09 DIAGNOSIS — Z4789 Encounter for other orthopedic aftercare: Secondary | ICD-10-CM | POA: Diagnosis not present

## 2021-03-09 DIAGNOSIS — R278 Other lack of coordination: Secondary | ICD-10-CM | POA: Diagnosis not present

## 2021-03-09 DIAGNOSIS — Z96652 Presence of left artificial knee joint: Secondary | ICD-10-CM | POA: Diagnosis not present

## 2021-03-09 DIAGNOSIS — R41841 Cognitive communication deficit: Secondary | ICD-10-CM | POA: Diagnosis not present

## 2021-03-09 DIAGNOSIS — R2689 Other abnormalities of gait and mobility: Secondary | ICD-10-CM | POA: Diagnosis not present

## 2021-03-09 NOTE — Telephone Encounter (Signed)
Sch per 1/12 inbasket, left msg °

## 2021-03-10 DIAGNOSIS — M62562 Muscle wasting and atrophy, not elsewhere classified, left lower leg: Secondary | ICD-10-CM | POA: Diagnosis not present

## 2021-03-10 DIAGNOSIS — Z471 Aftercare following joint replacement surgery: Secondary | ICD-10-CM | POA: Diagnosis not present

## 2021-03-10 DIAGNOSIS — M1712 Unilateral primary osteoarthritis, left knee: Secondary | ICD-10-CM | POA: Diagnosis not present

## 2021-03-10 DIAGNOSIS — R278 Other lack of coordination: Secondary | ICD-10-CM | POA: Diagnosis not present

## 2021-03-10 DIAGNOSIS — Z4789 Encounter for other orthopedic aftercare: Secondary | ICD-10-CM | POA: Diagnosis not present

## 2021-03-10 DIAGNOSIS — R2689 Other abnormalities of gait and mobility: Secondary | ICD-10-CM | POA: Diagnosis not present

## 2021-03-13 DIAGNOSIS — M1712 Unilateral primary osteoarthritis, left knee: Secondary | ICD-10-CM | POA: Diagnosis not present

## 2021-03-13 DIAGNOSIS — Z96652 Presence of left artificial knee joint: Secondary | ICD-10-CM | POA: Diagnosis not present

## 2021-03-13 DIAGNOSIS — M62562 Muscle wasting and atrophy, not elsewhere classified, left lower leg: Secondary | ICD-10-CM | POA: Diagnosis not present

## 2021-03-13 DIAGNOSIS — Z471 Aftercare following joint replacement surgery: Secondary | ICD-10-CM | POA: Diagnosis not present

## 2021-03-13 DIAGNOSIS — Z4789 Encounter for other orthopedic aftercare: Secondary | ICD-10-CM | POA: Diagnosis not present

## 2021-03-13 DIAGNOSIS — M66862 Spontaneous rupture of other tendons, left lower leg: Secondary | ICD-10-CM | POA: Diagnosis not present

## 2021-03-13 DIAGNOSIS — R41840 Attention and concentration deficit: Secondary | ICD-10-CM | POA: Diagnosis not present

## 2021-03-13 DIAGNOSIS — R278 Other lack of coordination: Secondary | ICD-10-CM | POA: Diagnosis not present

## 2021-03-13 DIAGNOSIS — R2689 Other abnormalities of gait and mobility: Secondary | ICD-10-CM | POA: Diagnosis not present

## 2021-03-14 DIAGNOSIS — Z4789 Encounter for other orthopedic aftercare: Secondary | ICD-10-CM | POA: Diagnosis not present

## 2021-03-14 DIAGNOSIS — Z471 Aftercare following joint replacement surgery: Secondary | ICD-10-CM | POA: Diagnosis not present

## 2021-03-14 DIAGNOSIS — M1712 Unilateral primary osteoarthritis, left knee: Secondary | ICD-10-CM | POA: Diagnosis not present

## 2021-03-14 DIAGNOSIS — R2689 Other abnormalities of gait and mobility: Secondary | ICD-10-CM | POA: Diagnosis not present

## 2021-03-14 DIAGNOSIS — R278 Other lack of coordination: Secondary | ICD-10-CM | POA: Diagnosis not present

## 2021-03-14 DIAGNOSIS — M62562 Muscle wasting and atrophy, not elsewhere classified, left lower leg: Secondary | ICD-10-CM | POA: Diagnosis not present

## 2021-03-15 DIAGNOSIS — R41840 Attention and concentration deficit: Secondary | ICD-10-CM | POA: Diagnosis not present

## 2021-03-15 DIAGNOSIS — Z4789 Encounter for other orthopedic aftercare: Secondary | ICD-10-CM | POA: Diagnosis not present

## 2021-03-15 DIAGNOSIS — M66862 Spontaneous rupture of other tendons, left lower leg: Secondary | ICD-10-CM | POA: Diagnosis not present

## 2021-03-15 DIAGNOSIS — Z471 Aftercare following joint replacement surgery: Secondary | ICD-10-CM | POA: Diagnosis not present

## 2021-03-15 DIAGNOSIS — R41841 Cognitive communication deficit: Secondary | ICD-10-CM | POA: Diagnosis not present

## 2021-03-15 DIAGNOSIS — R413 Other amnesia: Secondary | ICD-10-CM | POA: Diagnosis not present

## 2021-03-15 DIAGNOSIS — M1712 Unilateral primary osteoarthritis, left knee: Secondary | ICD-10-CM | POA: Diagnosis not present

## 2021-03-15 DIAGNOSIS — R2689 Other abnormalities of gait and mobility: Secondary | ICD-10-CM | POA: Diagnosis not present

## 2021-03-15 DIAGNOSIS — Z96652 Presence of left artificial knee joint: Secondary | ICD-10-CM | POA: Diagnosis not present

## 2021-03-15 DIAGNOSIS — R278 Other lack of coordination: Secondary | ICD-10-CM | POA: Diagnosis not present

## 2021-03-15 DIAGNOSIS — M62562 Muscle wasting and atrophy, not elsewhere classified, left lower leg: Secondary | ICD-10-CM | POA: Diagnosis not present

## 2021-03-16 ENCOUNTER — Inpatient Hospital Stay: Payer: Medicare Other | Admitting: Hematology

## 2021-03-16 ENCOUNTER — Inpatient Hospital Stay: Payer: Medicare Other

## 2021-03-16 DIAGNOSIS — Z471 Aftercare following joint replacement surgery: Secondary | ICD-10-CM | POA: Diagnosis not present

## 2021-03-16 DIAGNOSIS — Z4789 Encounter for other orthopedic aftercare: Secondary | ICD-10-CM | POA: Diagnosis not present

## 2021-03-16 DIAGNOSIS — M62562 Muscle wasting and atrophy, not elsewhere classified, left lower leg: Secondary | ICD-10-CM | POA: Diagnosis not present

## 2021-03-16 DIAGNOSIS — R2689 Other abnormalities of gait and mobility: Secondary | ICD-10-CM | POA: Diagnosis not present

## 2021-03-16 DIAGNOSIS — M1712 Unilateral primary osteoarthritis, left knee: Secondary | ICD-10-CM | POA: Diagnosis not present

## 2021-03-16 DIAGNOSIS — R278 Other lack of coordination: Secondary | ICD-10-CM | POA: Diagnosis not present

## 2021-03-17 DIAGNOSIS — Z4789 Encounter for other orthopedic aftercare: Secondary | ICD-10-CM | POA: Diagnosis not present

## 2021-03-17 DIAGNOSIS — R278 Other lack of coordination: Secondary | ICD-10-CM | POA: Diagnosis not present

## 2021-03-17 DIAGNOSIS — R2689 Other abnormalities of gait and mobility: Secondary | ICD-10-CM | POA: Diagnosis not present

## 2021-03-17 DIAGNOSIS — M1712 Unilateral primary osteoarthritis, left knee: Secondary | ICD-10-CM | POA: Diagnosis not present

## 2021-03-17 DIAGNOSIS — Z96652 Presence of left artificial knee joint: Secondary | ICD-10-CM | POA: Diagnosis not present

## 2021-03-17 DIAGNOSIS — M62562 Muscle wasting and atrophy, not elsewhere classified, left lower leg: Secondary | ICD-10-CM | POA: Diagnosis not present

## 2021-03-17 DIAGNOSIS — Z471 Aftercare following joint replacement surgery: Secondary | ICD-10-CM | POA: Diagnosis not present

## 2021-03-17 DIAGNOSIS — R41840 Attention and concentration deficit: Secondary | ICD-10-CM | POA: Diagnosis not present

## 2021-03-17 DIAGNOSIS — M66862 Spontaneous rupture of other tendons, left lower leg: Secondary | ICD-10-CM | POA: Diagnosis not present

## 2021-03-20 DIAGNOSIS — Z4789 Encounter for other orthopedic aftercare: Secondary | ICD-10-CM | POA: Diagnosis not present

## 2021-03-20 DIAGNOSIS — R278 Other lack of coordination: Secondary | ICD-10-CM | POA: Diagnosis not present

## 2021-03-20 DIAGNOSIS — Z96652 Presence of left artificial knee joint: Secondary | ICD-10-CM | POA: Diagnosis not present

## 2021-03-20 DIAGNOSIS — M62562 Muscle wasting and atrophy, not elsewhere classified, left lower leg: Secondary | ICD-10-CM | POA: Diagnosis not present

## 2021-03-20 DIAGNOSIS — R2689 Other abnormalities of gait and mobility: Secondary | ICD-10-CM | POA: Diagnosis not present

## 2021-03-20 DIAGNOSIS — M66862 Spontaneous rupture of other tendons, left lower leg: Secondary | ICD-10-CM | POA: Diagnosis not present

## 2021-03-20 DIAGNOSIS — R41840 Attention and concentration deficit: Secondary | ICD-10-CM | POA: Diagnosis not present

## 2021-03-20 DIAGNOSIS — M1712 Unilateral primary osteoarthritis, left knee: Secondary | ICD-10-CM | POA: Diagnosis not present

## 2021-03-20 DIAGNOSIS — Z471 Aftercare following joint replacement surgery: Secondary | ICD-10-CM | POA: Diagnosis not present

## 2021-03-21 ENCOUNTER — Encounter: Payer: Self-pay | Admitting: Orthopedic Surgery

## 2021-03-21 ENCOUNTER — Non-Acute Institutional Stay (SKILLED_NURSING_FACILITY): Payer: Medicare Other | Admitting: Orthopedic Surgery

## 2021-03-21 DIAGNOSIS — M7989 Other specified soft tissue disorders: Secondary | ICD-10-CM

## 2021-03-21 DIAGNOSIS — R413 Other amnesia: Secondary | ICD-10-CM | POA: Diagnosis not present

## 2021-03-21 DIAGNOSIS — S86812S Strain of other muscle(s) and tendon(s) at lower leg level, left leg, sequela: Secondary | ICD-10-CM | POA: Diagnosis not present

## 2021-03-21 DIAGNOSIS — Z96652 Presence of left artificial knee joint: Secondary | ICD-10-CM

## 2021-03-21 DIAGNOSIS — R2689 Other abnormalities of gait and mobility: Secondary | ICD-10-CM | POA: Diagnosis not present

## 2021-03-21 DIAGNOSIS — I10 Essential (primary) hypertension: Secondary | ICD-10-CM | POA: Diagnosis not present

## 2021-03-21 DIAGNOSIS — R278 Other lack of coordination: Secondary | ICD-10-CM | POA: Diagnosis not present

## 2021-03-21 DIAGNOSIS — Z471 Aftercare following joint replacement surgery: Secondary | ICD-10-CM | POA: Diagnosis not present

## 2021-03-21 DIAGNOSIS — D508 Other iron deficiency anemias: Secondary | ICD-10-CM | POA: Diagnosis not present

## 2021-03-21 DIAGNOSIS — M1712 Unilateral primary osteoarthritis, left knee: Secondary | ICD-10-CM | POA: Diagnosis not present

## 2021-03-21 DIAGNOSIS — Z4789 Encounter for other orthopedic aftercare: Secondary | ICD-10-CM | POA: Diagnosis not present

## 2021-03-21 DIAGNOSIS — M62562 Muscle wasting and atrophy, not elsewhere classified, left lower leg: Secondary | ICD-10-CM | POA: Diagnosis not present

## 2021-03-21 NOTE — Progress Notes (Signed)
Location:  [facility] Room Number: 154-A Place of Service:  SNF [phone]) Provider:  Windell Moulding, AGNP-C  Virgie Dad, MD  Patient Care Team: Virgie Dad, MD as PCP - General (Internal Medicine)  Extended Emergency Contact Information Primary Emergency Contact: Radilla,[name] Address: [address] [place], [place] [address] [place] Phone: [phone] Mobile Phone: [phone] Relation: Spouse Secondary Emergency Contact: [place],Steve Mobile Phone: [phone] Relation: Son Interpreter needed? No  Code Status:  Full code Goals of care: Advanced Directive information Advanced Directives 02/09/2021  Does Patient Have a Medical Advance Directive? Yes  Type of Paramedic of [place];Living will  Does patient want to make changes to medical advance directive? No - Patient declined  Copy of [place] in Chart? Yes - validated most recent copy scanned in chart (See row information)  Would patient like information on creating a medical advance directive? -  Pre-existing out of facility DNR order (yellow form or pink MOST form) -     Chief Complaint  Patient presents with   Acute Visit    edema    HPI:  Pt is a 87 y.o. female seen today for acute visit due to left lower leg edema.   S/p left total knee replacement 12/12/20 by Dr. Lazarus Gowda. She tolerated procedure well and was discharged to [facility] rehabilitation for PT/OT. 01/10/21 she was seen by ortho for increased left knee pain after feeling a pop with PT.01/16/21 she underwent left patellar tendon repair. She has been a patient on the rehab unit since second surgery.   Today, nursing reports +1 edema to LLE. She denies increased pain to left knee, calf or ankle. WBAT. Ambulating about 200 ft with walker. Vitals stable. No recent falls. Scheduled to see Dr. Wynelle Link 03/28/20.   Anemia- Hgb  9.4 01/26/21, followed by oncology, remains on B12 and iron Memory loss- forgetful at times, MMSE 26/30 01/21/21, no behavioral outbursts HTN- BUN/creat 11.4/0.50 01/26/21, remains on Benicar and Norvasc   Past Medical History:  Diagnosis Date   Dementia ([place])    Gait abnormality 06/20/2020   Hypertension    Hyponatremia    Iron deficiency anemia    Major depression    OA (osteoarthritis)    Pre-diabetes    Vitamin B 12 deficiency    Past Surgical History:  Procedure Laterality Date   ABDOMINAL HYSTERECTOMY     COLONOSCOPY     HEMANGIOMA EXCISION     JOINT REPLACEMENT     PATELLAR TENDON REPAIR Left 01/16/2021   Procedure: Left patellar tendon reconstruction;  Surgeon: Gaynelle Arabian, MD;  [facility]: [facility];  Service: Orthopedics;  Laterality: Left;   TONSILLECTOMY     TOTAL HIP ARTHROPLASTY Right 02/18/2019   Procedure: TOTAL HIP ARTHROPLASTY ANTERIOR APPROACH;  Surgeon: Gaynelle Arabian, MD;  [facility]: [facility];  Service: Orthopedics;  Laterality: Right;  112min   TOTAL KNEE ARTHROPLASTY Right 05/27/2012   Procedure: TOTAL KNEE ARTHROPLASTY;  Surgeon: Meredith Pel, MD;  Location: [facility];  Service: Orthopedics;  Laterality: Right;  Right Total Knee Arthroplasty   TOTAL KNEE ARTHROPLASTY Left 12/12/2020   Procedure: TOTAL KNEE ARTHROPLASTY;  Surgeon: Gaynelle Arabian, MD;  [facility];  Service: Orthopedics;  Laterality: Left;   TOTAL KNEE REVISION Right 08/06/2018   Procedure: TOTAL KNEE REVISION;  Surgeon: Gaynelle Arabian, MD;  [facility];  Service: Orthopedics;  Laterality: Right;  TUBAL LIGATION      No Known Allergies  Outpatient Encounter Medications as of 03/21/2021  Medication Sig   acetaminophen (TYLENOL) 325 MG tablet Take 650 mg by mouth 2 (two) times daily.   acetaminophen (TYLENOL) 500 MG tablet Take 500 mg by mouth every 6 (six) hours as needed for moderate pain.   amLODipine (NORVASC) 5 MG tablet Take 5 mg by mouth in the morning.   aspirin EC 81 MG  tablet Take 81 mg by mouth daily.   cholecalciferol (VITAMIN D3) 25 MCG (1000 UT) tablet Take 1,000 Units by mouth daily.   COMBIGAN 0.2-0.5 % ophthalmic solution Place 1 drop into the right eye in the morning and at bedtime.   Cyanocobalamin (B-12) 2500 MCG TABS Take 1 tablet by mouth daily.   dorzolamide (TRUSOPT) 2 % ophthalmic solution Place 1 drop into the right eye 2 (two) times daily.    ferrous sulfate 325 (65 FE) MG tablet Take 325 mg by mouth daily with breakfast.   lactose free nutrition (BOOST) LIQD Take 237 mLs by mouth daily.   latanoprost (XALATAN) 0.005 % ophthalmic solution Place 1 drop into both eyes at bedtime.    methocarbamol (ROBAXIN) 500 MG tablet Take 250 mg by mouth every 6 (six) hours as needed for muscle spasms.   Multiple Vitamins-Minerals (PRESERVISION AREDS 2 PO) Take 1 capsule by mouth in the morning and at bedtime.   olmesartan (BENICAR) 40 MG tablet Take 1 tablet (40 mg total) by mouth daily.   polyethylene glycol (MIRALAX / GLYCOLAX) 17 g packet Take 17 g by mouth daily. In the morning between 8-11 am   No facility-administered encounter medications on file as of 03/21/2021.    Review of Systems  Constitutional:  Negative for activity change, appetite change, chills and fever.  HENT: Negative.    Eyes: Negative.   Respiratory:  Negative for cough, shortness of breath and wheezing.   Cardiovascular:  Positive for leg swelling. Negative for chest pain.  Gastrointestinal:  Negative for abdominal distention, abdominal pain, constipation, diarrhea and nausea.  Genitourinary: Negative.   Musculoskeletal:  Positive for arthralgias, gait problem and joint swelling.  Skin:  Negative for wound.  Neurological:  Positive for weakness. Negative for dizziness and headaches.  Psychiatric/Behavioral:  Positive for confusion. Negative for dysphoric mood and sleep disturbance. The patient is not nervous/anxious.    Immunization History  Administered Date(s) Administered    Fluad Quad(high Dose 65+) 12/28/2019   Influenza, High Dose Seasonal PF 12/07/2016, 12/21/2017   Influenza, Quadrivalent, Recombinant, Inj, Pf 12/07/2017, 12/02/2020   Influenza,inj,quad, With Preservative 11/27/2018   Influenza-Unspecified 12/20/2011, 12/25/2012, 12/01/2013, 12/31/2013, 11/26/2017   Moderna Covid-19 Vaccine Bivalent Booster 72yrs & up 12/09/2020   Moderna SARS-COV2 Booster Vaccination 01/12/2020, 12/09/2020   Moderna Sars-Covid-2 Vaccination 03/10/2019, 04/07/2019, 06/06/2020   Pneumococcal Conjugate-13 02/06/2016   Pneumococcal Polysaccharide-23 02/18/2002, 02/11/2017   Td (Adult) 03/02/2011   Unspecified SARS-COV-2 Vaccination 03/11/2019   Zoster Recombinat (Shingrix) 02/15/2020, 06/01/2020   Pertinent  Health Maintenance Due  Topic Date Due   DEXA SCAN  04/26/2021 (Originally 07/27/2001)   INFLUENZA VACCINE  Completed   Fall Risk 01/16/2021 01/16/2021 01/17/2021 01/17/2021 01/18/2021  Patient Fall Risk Level High fall risk High fall risk High fall risk High fall risk High fall risk   Functional Status Survey:    Vitals:   03/21/21 0920  BP: 122/84  Pulse: 71  Resp: 16  Temp: 98.5 F (36.9 C)  SpO2: 99%  Weight: 126 lb 9.6 oz (  57.4 kg)  Height: 5\' 4"  (1.626 m)   Body mass index is 21.73 kg/m. Physical Exam Vitals reviewed.  Constitutional:      General: She is not in acute distress. HENT:     Head: Normocephalic.  Eyes:     General:        Right eye: No discharge.        Left eye: No discharge.  Cardiovascular:     Rate and Rhythm: Normal rate and regular rhythm.     Pulses: Normal pulses.     Heart sounds: Normal heart sounds. No murmur heard. Pulmonary:     Effort: Pulmonary effort is normal. No respiratory distress.     Breath sounds: Normal breath sounds. No wheezing.  Abdominal:     General: Bowel sounds are normal. There is no distension.     Palpations: Abdomen is soft.     Tenderness: There is no abdominal tenderness.   Musculoskeletal:     Cervical back: Neck supple.     Left knee: Swelling and erythema present. No crepitus. Tenderness present over the MCL.     Right lower leg: No edema.     Left lower leg: Edema present.     Comments: Non pitting, left knee warm to touch, left knee surgical incision healed  Skin:    General: Skin is warm and dry.     Capillary Refill: Capillary refill takes less than 2 seconds.  Neurological:     General: No focal deficit present.     Mental Status: She is alert. Mental status is at baseline.     Motor: Weakness present.     Gait: Gait abnormal.     Comments: walker  Psychiatric:        Mood and Affect: Mood normal.        Behavior: Behavior normal.        Cognition and Memory: Memory is impaired.     Comments: Forgetful, alert to self/faces/place    Labs reviewed: Recent Labs    12/21/20 1721 12/22/20 1218 12/23/20 0433 12/26/20 0000 01/17/21 0310 01/18/21 0320 01/26/21 0000  NA 125*   < > 129*   < > 136 132* 138  K 4.6   < > 3.7   < > 3.5 3.3* 4.5  CL 94*   < > 95*   < > 103 100 104  CO2 24   < > 23   < > 28 27 24*  GLUCOSE 119*   < > 108*  --  90 103*  --   BUN 13   < > 14   < > 9 10 11   CREATININE 0.85   < > 0.75   < > 0.50 0.51 0.5  CALCIUM 8.5*   < > 8.6*   < > 8.2* 8.2* 8.7  MG 1.8  --   --   --   --   --   --   PHOS 2.2*  --   --   --   --   --   --    < > = values in this interval not displayed.   Recent Labs    11/23/20 1434 12/02/20 1443 12/21/20 1721 12/29/20 0000  AST 11* 14* 21 16  ALT 10 12 27 15   ALKPHOS 81 66 122 130*  BILITOT 0.4 0.3 0.5  --   PROT 7.2 7.1 6.9  --   ALBUMIN 3.2* 3.4* 3.4* 3.7   Recent Labs    11/23/20 1434  12/02/20 1443 12/21/20 1721 12/29/20 0000 01/17/21 0310 01/18/21 0320 01/26/21 0000  WBC 6.3   < > 11.6*   < > 6.5 6.5 5.1  NEUTROABS 3.9  --  9.2*  --   --   --   --   HGB 9.8*   < > 10.4*   < > 9.4* 8.3* 9.4*  HCT 29.6*   < > 31.9*   < > 28.4* 25.6* 30*  MCV 92.5   < > 94.4  --  97.3  97.0  --   PLT 347   < > 349   < > 228 214 327   < > = values in this interval not displayed.   Lab Results  Component Value Date   TSH 1.488 12/21/2020   No results found for: HGBA1C No results found for: CHOL, HDL, LDLCALC, LDLDIRECT, TRIG, CHOLHDL  Significant Diagnostic Results in last 30 days:  No results found.  Assessment/Plan 1. Left leg swelling - non pitting edema to LLE- mild - recommend leg elevation 2-4 hrs daily - recommend limiting sodium in diet - recommend ted hose  2. S/P Repair Patellar tendon rupture, left, sequela - followed by Dr. Wynelle Link - WBAT, ambulating 250ft - pain controlled with tylenol - moving bowels well - cont PT  3. Status post total left knee replacement - mild swelling  and warmth to left knee, afebrile - see above  4. Other iron deficiency anemia - hgb 9.4 01/26/21 - followed by oncology - cont B12 and iron  5. Short-term memory loss - MMSE 26/30 16/38/46 - postop complication?   6. Essential hypertension - controlled - cont Benicar and Norvasc    Family/ staff Communication: plans discussed with patient and nurse  Labs/tests ordered:  none

## 2021-03-22 DIAGNOSIS — M1712 Unilateral primary osteoarthritis, left knee: Secondary | ICD-10-CM | POA: Diagnosis not present

## 2021-03-22 DIAGNOSIS — Z471 Aftercare following joint replacement surgery: Secondary | ICD-10-CM | POA: Diagnosis not present

## 2021-03-22 DIAGNOSIS — M62562 Muscle wasting and atrophy, not elsewhere classified, left lower leg: Secondary | ICD-10-CM | POA: Diagnosis not present

## 2021-03-22 DIAGNOSIS — Z4789 Encounter for other orthopedic aftercare: Secondary | ICD-10-CM | POA: Diagnosis not present

## 2021-03-22 DIAGNOSIS — R2689 Other abnormalities of gait and mobility: Secondary | ICD-10-CM | POA: Diagnosis not present

## 2021-03-22 DIAGNOSIS — Z96652 Presence of left artificial knee joint: Secondary | ICD-10-CM | POA: Diagnosis not present

## 2021-03-22 DIAGNOSIS — M66862 Spontaneous rupture of other tendons, left lower leg: Secondary | ICD-10-CM | POA: Diagnosis not present

## 2021-03-22 DIAGNOSIS — R41840 Attention and concentration deficit: Secondary | ICD-10-CM | POA: Diagnosis not present

## 2021-03-22 DIAGNOSIS — R278 Other lack of coordination: Secondary | ICD-10-CM | POA: Diagnosis not present

## 2021-03-23 DIAGNOSIS — Z4789 Encounter for other orthopedic aftercare: Secondary | ICD-10-CM | POA: Diagnosis not present

## 2021-03-23 DIAGNOSIS — M1712 Unilateral primary osteoarthritis, left knee: Secondary | ICD-10-CM | POA: Diagnosis not present

## 2021-03-23 DIAGNOSIS — R278 Other lack of coordination: Secondary | ICD-10-CM | POA: Diagnosis not present

## 2021-03-23 DIAGNOSIS — Z471 Aftercare following joint replacement surgery: Secondary | ICD-10-CM | POA: Diagnosis not present

## 2021-03-23 DIAGNOSIS — R2689 Other abnormalities of gait and mobility: Secondary | ICD-10-CM | POA: Diagnosis not present

## 2021-03-23 DIAGNOSIS — M62562 Muscle wasting and atrophy, not elsewhere classified, left lower leg: Secondary | ICD-10-CM | POA: Diagnosis not present

## 2021-03-24 ENCOUNTER — Other Ambulatory Visit: Payer: Self-pay | Admitting: *Deleted

## 2021-03-24 DIAGNOSIS — Z471 Aftercare following joint replacement surgery: Secondary | ICD-10-CM | POA: Diagnosis not present

## 2021-03-24 DIAGNOSIS — Z4789 Encounter for other orthopedic aftercare: Secondary | ICD-10-CM | POA: Diagnosis not present

## 2021-03-24 DIAGNOSIS — R41840 Attention and concentration deficit: Secondary | ICD-10-CM | POA: Diagnosis not present

## 2021-03-24 DIAGNOSIS — D509 Iron deficiency anemia, unspecified: Secondary | ICD-10-CM

## 2021-03-24 DIAGNOSIS — M66862 Spontaneous rupture of other tendons, left lower leg: Secondary | ICD-10-CM | POA: Diagnosis not present

## 2021-03-24 DIAGNOSIS — M62562 Muscle wasting and atrophy, not elsewhere classified, left lower leg: Secondary | ICD-10-CM | POA: Diagnosis not present

## 2021-03-24 DIAGNOSIS — M1712 Unilateral primary osteoarthritis, left knee: Secondary | ICD-10-CM | POA: Diagnosis not present

## 2021-03-24 DIAGNOSIS — R278 Other lack of coordination: Secondary | ICD-10-CM | POA: Diagnosis not present

## 2021-03-24 DIAGNOSIS — Z96652 Presence of left artificial knee joint: Secondary | ICD-10-CM | POA: Diagnosis not present

## 2021-03-24 DIAGNOSIS — R2689 Other abnormalities of gait and mobility: Secondary | ICD-10-CM | POA: Diagnosis not present

## 2021-03-27 ENCOUNTER — Other Ambulatory Visit: Payer: Self-pay

## 2021-03-27 ENCOUNTER — Inpatient Hospital Stay: Payer: Medicare Other | Attending: Hematology

## 2021-03-27 ENCOUNTER — Inpatient Hospital Stay (HOSPITAL_BASED_OUTPATIENT_CLINIC_OR_DEPARTMENT_OTHER): Payer: Medicare Other | Admitting: Hematology

## 2021-03-27 VITALS — BP 124/70 | HR 82 | Temp 98.5°F | Resp 14

## 2021-03-27 DIAGNOSIS — R278 Other lack of coordination: Secondary | ICD-10-CM | POA: Diagnosis not present

## 2021-03-27 DIAGNOSIS — M62562 Muscle wasting and atrophy, not elsewhere classified, left lower leg: Secondary | ICD-10-CM | POA: Diagnosis not present

## 2021-03-27 DIAGNOSIS — Z96652 Presence of left artificial knee joint: Secondary | ICD-10-CM | POA: Diagnosis not present

## 2021-03-27 DIAGNOSIS — Z471 Aftercare following joint replacement surgery: Secondary | ICD-10-CM | POA: Diagnosis not present

## 2021-03-27 DIAGNOSIS — M1712 Unilateral primary osteoarthritis, left knee: Secondary | ICD-10-CM | POA: Diagnosis not present

## 2021-03-27 DIAGNOSIS — D509 Iron deficiency anemia, unspecified: Secondary | ICD-10-CM | POA: Diagnosis not present

## 2021-03-27 DIAGNOSIS — R2689 Other abnormalities of gait and mobility: Secondary | ICD-10-CM | POA: Diagnosis not present

## 2021-03-27 DIAGNOSIS — M66862 Spontaneous rupture of other tendons, left lower leg: Secondary | ICD-10-CM | POA: Diagnosis not present

## 2021-03-27 DIAGNOSIS — R41840 Attention and concentration deficit: Secondary | ICD-10-CM | POA: Diagnosis not present

## 2021-03-27 DIAGNOSIS — Z4789 Encounter for other orthopedic aftercare: Secondary | ICD-10-CM | POA: Diagnosis not present

## 2021-03-27 LAB — CBC WITH DIFFERENTIAL (CANCER CENTER ONLY)
Abs Immature Granulocytes: 0.02 10*3/uL (ref 0.00–0.07)
Basophils Absolute: 0 10*3/uL (ref 0.0–0.1)
Basophils Relative: 1 %
Eosinophils Absolute: 0.1 10*3/uL (ref 0.0–0.5)
Eosinophils Relative: 2 %
HCT: 34.4 % — ABNORMAL LOW (ref 36.0–46.0)
Hemoglobin: 11.3 g/dL — ABNORMAL LOW (ref 12.0–15.0)
Immature Granulocytes: 1 %
Lymphocytes Relative: 35 %
Lymphs Abs: 1.5 10*3/uL (ref 0.7–4.0)
MCH: 31.5 pg (ref 26.0–34.0)
MCHC: 32.8 g/dL (ref 30.0–36.0)
MCV: 95.8 fL (ref 80.0–100.0)
Monocytes Absolute: 0.4 10*3/uL (ref 0.1–1.0)
Monocytes Relative: 10 %
Neutro Abs: 2.2 10*3/uL (ref 1.7–7.7)
Neutrophils Relative %: 51 %
Platelet Count: 229 10*3/uL (ref 150–400)
RBC: 3.59 MIL/uL — ABNORMAL LOW (ref 3.87–5.11)
RDW: 13.1 % (ref 11.5–15.5)
WBC Count: 4.2 10*3/uL (ref 4.0–10.5)
nRBC: 0 % (ref 0.0–0.2)

## 2021-03-27 LAB — CMP (CANCER CENTER ONLY)
ALT: 19 U/L (ref 0–44)
AST: 17 U/L (ref 15–41)
Albumin: 4 g/dL (ref 3.5–5.0)
Alkaline Phosphatase: 60 U/L (ref 38–126)
Anion gap: 6 (ref 5–15)
BUN: 15 mg/dL (ref 8–23)
CO2: 29 mmol/L (ref 22–32)
Calcium: 9.1 mg/dL (ref 8.9–10.3)
Chloride: 104 mmol/L (ref 98–111)
Creatinine: 0.81 mg/dL (ref 0.44–1.00)
GFR, Estimated: >60 mL/min (ref >60)
Glucose, Bld: 94 mg/dL (ref 70–99)
Potassium: 4.4 mmol/L (ref 3.5–5.1)
Sodium: 139 mmol/L (ref 135–145)
Total Bilirubin: 0.3 mg/dL (ref 0.3–1.2)
Total Protein: 6.9 g/dL (ref 6.5–8.1)

## 2021-03-27 LAB — IRON AND IRON BINDING CAPACITY (CC-WL,HP ONLY)
Iron: 61 ug/dL (ref 28–170)
Saturation Ratios: 26 % (ref 10.4–31.8)
TIBC: 235 ug/dL — ABNORMAL LOW (ref 250–450)
UIBC: 174 ug/dL (ref 148–442)

## 2021-03-28 ENCOUNTER — Non-Acute Institutional Stay (SKILLED_NURSING_FACILITY): Payer: Medicare Other | Admitting: Orthopedic Surgery

## 2021-03-28 ENCOUNTER — Encounter: Payer: Self-pay | Admitting: Orthopedic Surgery

## 2021-03-28 DIAGNOSIS — R413 Other amnesia: Secondary | ICD-10-CM | POA: Diagnosis not present

## 2021-03-28 DIAGNOSIS — Z5189 Encounter for other specified aftercare: Secondary | ICD-10-CM | POA: Diagnosis not present

## 2021-03-28 DIAGNOSIS — S86812S Strain of other muscle(s) and tendon(s) at lower leg level, left leg, sequela: Secondary | ICD-10-CM

## 2021-03-28 DIAGNOSIS — D508 Other iron deficiency anemias: Secondary | ICD-10-CM | POA: Diagnosis not present

## 2021-03-28 DIAGNOSIS — Z96652 Presence of left artificial knee joint: Secondary | ICD-10-CM

## 2021-03-28 DIAGNOSIS — Z471 Aftercare following joint replacement surgery: Secondary | ICD-10-CM | POA: Diagnosis not present

## 2021-03-28 DIAGNOSIS — R2689 Other abnormalities of gait and mobility: Secondary | ICD-10-CM | POA: Diagnosis not present

## 2021-03-28 DIAGNOSIS — I10 Essential (primary) hypertension: Secondary | ICD-10-CM | POA: Diagnosis not present

## 2021-03-28 DIAGNOSIS — Z66 Do not resuscitate: Secondary | ICD-10-CM | POA: Diagnosis not present

## 2021-03-28 DIAGNOSIS — M62562 Muscle wasting and atrophy, not elsewhere classified, left lower leg: Secondary | ICD-10-CM | POA: Diagnosis not present

## 2021-03-28 DIAGNOSIS — M1712 Unilateral primary osteoarthritis, left knee: Secondary | ICD-10-CM | POA: Diagnosis not present

## 2021-03-28 DIAGNOSIS — Z4789 Encounter for other orthopedic aftercare: Secondary | ICD-10-CM | POA: Diagnosis not present

## 2021-03-28 DIAGNOSIS — R278 Other lack of coordination: Secondary | ICD-10-CM | POA: Diagnosis not present

## 2021-03-28 LAB — FERRITIN: Ferritin: 280 ng/mL (ref 11–307)

## 2021-03-28 NOTE — Progress Notes (Signed)
Location:  [facility] Room Number: 154-A Place of Service:  SNF [phone])  Provider: Yvonna Alanis, NP  PCP: Virgie Dad, MD Patient Care Team: Virgie Dad, MD as PCP - General (Internal Medicine)  Extended Emergency Contact Information Primary Emergency Contact: Philipp,[name] Address: [address] [place], [place] [address] Johnnette Litter of [place] Phone: [phone] Mobile Phone: [phone] Relation: Spouse Secondary Emergency Contact: Melaragno,Steve Mobile Phone: [phone] Relation: Son Interpreter needed? No  Code Status: DNR Goals of care:  Advanced Directive information Advanced Directives 03/28/2021  Does Patient Have a Medical Advance Directive? Yes  Type of Paramedic of [place];Living will;Out of facility DNR (pink MOST or yellow form)  Does patient want to make changes to medical advance directive? No - Patient declined  Copy of Center in Chart? Yes - validated most recent copy scanned in chart (See row information)  Would patient like information on creating a medical advance directive? -  Pre-existing out of facility DNR order (yellow form or pink MOST form) Yellow form placed in chart (order not valid for inpatient use)     No Known Allergies  Chief Complaint  Patient presents with   Discharge Note    Discharged from [facility].     HPI:  87 y.o. female seen today for discharge evaluation.   She currently resides on the rehab unit at [facility] PMH includes: HTN, multiple knee surgeries- including TKA and patella tendon repair, iron deficiency anemia, short term memory loss and depression.   S/p left total knee replacement 12/12/20 by Dr. Lazarus Gowda. She tolerated procedure well and was discharged to [facility] rehabilitation for PT/OT. 01/10/21 she was seen by ortho for increased left knee pain after feeling a pop with  PT.01/16/21 she underwent left patellar tendon repair. She has been a patient on the rehab unit since second surgery.   Today, she just finished PT. Denies pain after session. Taking tylenol and robaxin prn for increased pain/muscle spasms. Ambulating > 200 ft with walker. WBAT. Continues to wear knee brace. She plans to f/u with Dr. Wynelle Link this afternoon. Vitals stable. No recent falls. LBM 01/31. She is eating well. Weight has returned to normal.   Anemia- Hgb 11.3 03/27/2021, followed by oncology, remains on B12 and iron Memory loss- forgetful at times, MMSE 26/30 01/21/21, no behavioral outbursts HTN- BUN/creat 15/0.81 03/27/2021, remains on Benicar and Norvasc  Recent blood pressure:  01/31- 124/63  01/30- 111/68  01/29- 117/76  Recent weights:  01/31- 127.6 lbs  01/03- 123 lbs  12/01- 121.6 lbs  She plans to discharge home later on today. Husband will be helping at home. Home Health PT/OT ordered. She already has a walker. I have advised her to follow up with PCP in 2 weeks.    Past Medical History:  Diagnosis Date   Dementia ([place])    Gait abnormality 06/20/2020   Hypertension    Hyponatremia    Iron deficiency anemia    Major depression    OA (osteoarthritis)    Pre-diabetes    Vitamin B 12 deficiency     Past Surgical History:  Procedure Laterality Date   ABDOMINAL HYSTERECTOMY     COLONOSCOPY     HEMANGIOMA EXCISION     JOINT REPLACEMENT     PATELLAR TENDON REPAIR Left 01/16/2021   Procedure: Left patellar tendon reconstruction;  Surgeon: Gaynelle Arabian, MD;  Location:  [facility];  Service: Orthopedics;  Laterality: Left;   TONSILLECTOMY     TOTAL HIP ARTHROPLASTY Right 02/18/2019   Procedure: TOTAL HIP ARTHROPLASTY ANTERIOR APPROACH;  Surgeon: Gaynelle Arabian, MD;  Location: [facility];  Service: Orthopedics;  Laterality: Right;  144min   TOTAL KNEE ARTHROPLASTY Right 05/27/2012   Procedure: TOTAL KNEE ARTHROPLASTY;  Surgeon: Meredith Pel, MD;  Location: [facility];   Service: Orthopedics;  Laterality: Right;  Right Total Knee Arthroplasty   TOTAL KNEE ARTHROPLASTY Left 12/12/2020   Procedure: TOTAL KNEE ARTHROPLASTY;  Surgeon: Gaynelle Arabian, MD;  [facility]: [facility];  Service: Orthopedics;  Laterality: Left;   TOTAL KNEE REVISION Right 08/06/2018   Procedure: TOTAL KNEE REVISION;  Surgeon: Gaynelle Arabian, MD;  Location: [facility];  Service: Orthopedics;  Laterality: Right;   TUBAL LIGATION        reports that she quit smoking about 24 years ago. Her smoking use included cigarettes. She has never used smokeless tobacco. She reports current alcohol use of about 1.0 standard drink per week. She reports that she does not use drugs. Social History   Socioeconomic History   Marital status: Married    Spouse name: Shanon Brow   Number of children: Not on file   Years of education: Not on file   Highest education level: Not on file  Occupational History   Not on file  Tobacco Use   Smoking status: Former    Types: Cigarettes    Quit date: 1999    Years since quitting: 24.0   Smokeless tobacco: Never  Vaping Use   Vaping Use: Never used  Substance and Sexual Activity   Alcohol use: Yes    Alcohol/week: 1.0 standard drink    Types: 1 Glasses of wine per week    Comment: daily   Drug use: No   Sexual activity: Not on file  Other Topics Concern   Not on file  Social History Narrative   Lives with husband   Right Handed   Drinks caffeine rarely   Social Determinants of Health   Financial Resource Strain: Not on file  Food Insecurity: Not on file  Transportation Needs: Not on file  Physical Activity: Not on file  Stress: Not on file  Social Connections: Not on file  Intimate Partner Violence: Not on file   Functional Status Survey:    No Known Allergies  Pertinent  Health Maintenance Due  Topic Date Due   DEXA SCAN  04/26/2021 (Originally 07/27/2001)   INFLUENZA VACCINE  Completed    Medications: Outpatient Encounter Medications as of 03/28/2021   Medication Sig   acetaminophen (TYLENOL) 325 MG tablet Take 650 mg by mouth 2 (two) times daily.   acetaminophen (TYLENOL) 500 MG tablet Take 500 mg by mouth every 6 (six) hours as needed for moderate pain.   amLODipine (NORVASC) 5 MG tablet Take 5 mg by mouth in the morning.   aspirin EC 81 MG tablet Take 81 mg by mouth daily.   cholecalciferol (VITAMIN D3) 25 MCG (1000 UT) tablet Take 1,000 Units by mouth daily.   COMBIGAN 0.2-0.5 % ophthalmic solution Place 1 drop into the right eye in the morning and at bedtime.   Cyanocobalamin (B-12) 2500 MCG TABS Take 1 tablet by mouth daily.   dorzolamide (TRUSOPT) 2 % ophthalmic solution Place 1 drop into the right eye 2 (two) times daily.    ferrous sulfate 325 (65 FE) MG tablet Take 325 mg by mouth daily with breakfast.   lactose  free nutrition (BOOST) LIQD Take 237 mLs by mouth daily.   latanoprost (XALATAN) 0.005 % ophthalmic solution Place 1 drop into both eyes at bedtime.    methocarbamol (ROBAXIN) 500 MG tablet Take 250 mg by mouth every 6 (six) hours as needed for muscle spasms.   Multiple Vitamins-Minerals (PRESERVISION AREDS 2 PO) Take 1 capsule by mouth in the morning and at bedtime.   olmesartan (BENICAR) 40 MG tablet Take 1 tablet (40 mg total) by mouth daily.   polyethylene glycol (MIRALAX / GLYCOLAX) 17 g packet Take 17 g by mouth daily. In the morning between 8-11 am   No facility-administered encounter medications on file as of 03/28/2021.    Review of Systems  Constitutional:  Negative for activity change, appetite change, chills, fatigue and fever.  HENT:  Negative for congestion and trouble swallowing.   Eyes:  Negative for visual disturbance.  Respiratory:  Negative for cough, shortness of breath and wheezing.   Cardiovascular:  Negative for chest pain and leg swelling.  Gastrointestinal:  Negative for abdominal distention, abdominal pain, constipation, diarrhea, nausea and vomiting.  Genitourinary:  Negative for dysuria,  frequency and hematuria.  Musculoskeletal:  Positive for arthralgias, gait problem and joint swelling.  Skin:  Negative for wound.  Neurological:  Negative for dizziness, weakness and headaches.  Psychiatric/Behavioral:  Positive for confusion. Negative for dysphoric mood and sleep disturbance. The patient is not nervous/anxious.    Vitals:   03/28/21 1320  BP: 124/63  Pulse: 76  Resp: 14  SpO2: 97%  Weight: 127 lb 9.6 oz (57.9 kg)  Height: 5\' 4"  (1.626 m)   Body mass index is 21.9 kg/m. Physical Exam Vitals reviewed.  Constitutional:      General: She is not in acute distress. HENT:     Head: Normocephalic.     Right Ear: There is no impacted cerumen.     Left Ear: There is no impacted cerumen.     Nose: Nose normal.     Mouth/Throat:     Mouth: Mucous membranes are moist.  Eyes:     General:        Right eye: No discharge.        Left eye: No discharge.  Neck:     Vascular: No carotid bruit.  Cardiovascular:     Rate and Rhythm: Normal rate and regular rhythm.     Pulses: Normal pulses.     Heart sounds: Normal heart sounds. No murmur heard. Pulmonary:     Effort: Pulmonary effort is normal. No respiratory distress.     Breath sounds: Normal breath sounds. No wheezing.  Abdominal:     General: Bowel sounds are normal. There is no distension.     Palpations: Abdomen is soft.     Tenderness: There is no abdominal tenderness.  Musculoskeletal:     Cervical back: Neck supple.     Left knee: Swelling present. No deformity, erythema or ecchymosis. Decreased range of motion. No tenderness.     Right lower leg: No edema.     Left lower leg: No edema.     Comments: Left knee  with mild swelling over MCL, slightly warm to touch  Lymphadenopathy:     Cervical: No cervical adenopathy.  Skin:    General: Skin is warm and dry.     Capillary Refill: Capillary refill takes less than 2 seconds.     Comments: Left knee incision healed  Neurological:     General: No focal  deficit present.  Mental Status: She is alert and oriented to person, place, and time.     Motor: Weakness present.     Gait: Gait abnormal.  Psychiatric:        Mood and Affect: Mood normal.        Behavior: Behavior normal.    Labs reviewed: Basic Metabolic Panel: Recent Labs    12/21/20 1721 12/22/20 1218 01/17/21 0310 01/18/21 0320 01/26/21 0000 03/27/21 1454  NA 125*   < > 136 132* 138 139  K 4.6   < > 3.5 3.3* 4.5 4.4  CL 94*   < > 103 100 104 104  CO2 24   < > 28 27 24* 29  GLUCOSE 119*   < > 90 103*  --  94  BUN 13   < > 9 10 11 15   CREATININE 0.85   < > 0.50 0.51 0.5 0.81  CALCIUM 8.5*   < > 8.2* 8.2* 8.7 9.1  MG 1.8  --   --   --   --   --   PHOS 2.2*  --   --   --   --   --    < > = values in this interval not displayed.   Liver Function Tests: Recent Labs    12/02/20 1443 12/21/20 1721 12/29/20 0000 03/27/21 1454  AST 14* 21 16 17   ALT 12 27 15 19   ALKPHOS 66 122 130* 60  BILITOT 0.3 0.5  --  0.3  PROT 7.1 6.9  --  6.9  ALBUMIN 3.4* 3.4* 3.7 4.0   No results for input(s): LIPASE, AMYLASE in the last 8760 hours. No results for input(s): AMMONIA in the last 8760 hours. CBC: Recent Labs    11/23/20 1434 12/02/20 1443 12/21/20 1721 12/29/20 0000 01/17/21 0310 01/18/21 0320 01/26/21 0000 03/27/21 1454  WBC 6.3   < > 11.6*   < > 6.5 6.5 5.1 4.2  NEUTROABS 3.9  --  9.2*  --   --   --   --  2.2  HGB 9.8*   < > 10.4*   < > 9.4* 8.3* 9.4* 11.3*  HCT 29.6*   < > 31.9*   < > 28.4* 25.6* 30* 34.4*  MCV 92.5   < > 94.4  --  97.3 97.0  --  95.8  PLT 347   < > 349   < > 228 214 327 229   < > = values in this interval not displayed.   Cardiac Enzymes: Recent Labs    04/27/20 1559  CKTOTAL 25*   BNP: Invalid input(s): POCBNP CBG: No results for input(s): GLUCAP in the last 8760 hours.  Procedures and Imaging Studies During Stay: No results found.  Assessment/Plan:   1. Do not resuscitate  2. S/P Repair Patellar tendon rupture, left,  sequela - followed by dr. Wynelle Link- scheduled today - WBAT, ambulating > 200 ft - continues to wear knee brace - minimal pain - home health PT/OT - cont tylenol and robaxin prn for pain  3. Status post total left knee replacement - mild swelling and warmth to left knee- unchanged from last encounter  4. Other iron deficiency anemia - hgb 11.3 03/27/2021 - followed by oncology - cont B12 and iron  5. Short-term memory loss - MMSE 26/30 - ? Postop complication - appropriate today  6. Essential hypertension - controlled - cont Benicar and Norvasc     Patient is being discharged with the following home health services:  PT/OT  Patient is being discharged with the following durable medical equipment:  none  Patient has been advised to f/u with their PCP in 1-2 weeks to for a transitions of care visit. Social services at their facility was responsible for arranging this appointment.  Pt was provided with adequate prescriptions of noncontrolled medications to reach the scheduled appointment . For controlled substances, a limited supply was provided as appropriate for the individual patient.  If the pt normally receives these medications from a pain clinic or has a contract with another physician, these medications should be received from that clinic or physician only).    Future labs/tests needed:  none

## 2021-03-29 ENCOUNTER — Other Ambulatory Visit: Payer: Self-pay

## 2021-03-29 ENCOUNTER — Encounter (INDEPENDENT_AMBULATORY_CARE_PROVIDER_SITE_OTHER): Payer: Medicare Other | Admitting: Ophthalmology

## 2021-03-29 DIAGNOSIS — I10 Essential (primary) hypertension: Secondary | ICD-10-CM | POA: Diagnosis not present

## 2021-03-29 DIAGNOSIS — H35033 Hypertensive retinopathy, bilateral: Secondary | ICD-10-CM

## 2021-03-29 DIAGNOSIS — H353211 Exudative age-related macular degeneration, right eye, with active choroidal neovascularization: Secondary | ICD-10-CM

## 2021-03-29 DIAGNOSIS — M1712 Unilateral primary osteoarthritis, left knee: Secondary | ICD-10-CM | POA: Diagnosis not present

## 2021-03-29 DIAGNOSIS — Z4789 Encounter for other orthopedic aftercare: Secondary | ICD-10-CM | POA: Diagnosis not present

## 2021-03-29 DIAGNOSIS — H43813 Vitreous degeneration, bilateral: Secondary | ICD-10-CM | POA: Diagnosis not present

## 2021-03-29 DIAGNOSIS — H353122 Nonexudative age-related macular degeneration, left eye, intermediate dry stage: Secondary | ICD-10-CM | POA: Diagnosis not present

## 2021-03-29 DIAGNOSIS — M62562 Muscle wasting and atrophy, not elsewhere classified, left lower leg: Secondary | ICD-10-CM | POA: Diagnosis not present

## 2021-03-29 DIAGNOSIS — R2689 Other abnormalities of gait and mobility: Secondary | ICD-10-CM | POA: Diagnosis not present

## 2021-03-29 DIAGNOSIS — Z471 Aftercare following joint replacement surgery: Secondary | ICD-10-CM | POA: Diagnosis not present

## 2021-03-29 DIAGNOSIS — R278 Other lack of coordination: Secondary | ICD-10-CM | POA: Diagnosis not present

## 2021-03-30 ENCOUNTER — Other Ambulatory Visit: Payer: Medicare Other

## 2021-03-30 ENCOUNTER — Ambulatory Visit: Payer: Medicare Other | Admitting: Hematology

## 2021-03-30 ENCOUNTER — Other Ambulatory Visit: Payer: Self-pay

## 2021-03-30 DIAGNOSIS — Z4789 Encounter for other orthopedic aftercare: Secondary | ICD-10-CM | POA: Diagnosis not present

## 2021-03-30 DIAGNOSIS — M1712 Unilateral primary osteoarthritis, left knee: Secondary | ICD-10-CM | POA: Diagnosis not present

## 2021-03-30 DIAGNOSIS — Z471 Aftercare following joint replacement surgery: Secondary | ICD-10-CM | POA: Diagnosis not present

## 2021-03-30 DIAGNOSIS — M62562 Muscle wasting and atrophy, not elsewhere classified, left lower leg: Secondary | ICD-10-CM | POA: Diagnosis not present

## 2021-03-30 DIAGNOSIS — Z96652 Presence of left artificial knee joint: Secondary | ICD-10-CM | POA: Diagnosis not present

## 2021-03-30 DIAGNOSIS — R2689 Other abnormalities of gait and mobility: Secondary | ICD-10-CM | POA: Diagnosis not present

## 2021-03-30 DIAGNOSIS — M66862 Spontaneous rupture of other tendons, left lower leg: Secondary | ICD-10-CM | POA: Diagnosis not present

## 2021-03-30 DIAGNOSIS — R278 Other lack of coordination: Secondary | ICD-10-CM | POA: Diagnosis not present

## 2021-03-30 DIAGNOSIS — R41840 Attention and concentration deficit: Secondary | ICD-10-CM | POA: Diagnosis not present

## 2021-03-30 MED ORDER — OLMESARTAN MEDOXOMIL 40 MG PO TABS: 40.0000 mg | ORAL_TABLET | Freq: Every day | ORAL | 0 refills | Status: DC

## 2021-03-30 NOTE — Telephone Encounter (Signed)
Message left on clinical intake voicemail:   Patients husband called on her behalf requesting a [facility]  I returned call to patient and verified request as [facility] not [facility] active medication [facility] list, patient confirmed.  Rx sent

## 2021-03-31 DIAGNOSIS — Z471 Aftercare following joint replacement surgery: Secondary | ICD-10-CM | POA: Diagnosis not present

## 2021-03-31 DIAGNOSIS — M62562 Muscle wasting and atrophy, not elsewhere classified, left lower leg: Secondary | ICD-10-CM | POA: Diagnosis not present

## 2021-03-31 DIAGNOSIS — R41840 Attention and concentration deficit: Secondary | ICD-10-CM | POA: Diagnosis not present

## 2021-03-31 DIAGNOSIS — Z96652 Presence of left artificial knee joint: Secondary | ICD-10-CM | POA: Diagnosis not present

## 2021-03-31 DIAGNOSIS — R2689 Other abnormalities of gait and mobility: Secondary | ICD-10-CM | POA: Diagnosis not present

## 2021-03-31 DIAGNOSIS — M1712 Unilateral primary osteoarthritis, left knee: Secondary | ICD-10-CM | POA: Diagnosis not present

## 2021-03-31 DIAGNOSIS — M66862 Spontaneous rupture of other tendons, left lower leg: Secondary | ICD-10-CM | POA: Diagnosis not present

## 2021-03-31 DIAGNOSIS — Z4789 Encounter for other orthopedic aftercare: Secondary | ICD-10-CM | POA: Diagnosis not present

## 2021-03-31 DIAGNOSIS — R278 Other lack of coordination: Secondary | ICD-10-CM | POA: Diagnosis not present

## 2021-04-02 ENCOUNTER — Encounter: Payer: Self-pay | Admitting: Hematology

## 2021-04-02 NOTE — Progress Notes (Signed)
[facility]   HEMATOLOGY/ONCOLOGY CLINIC NOTE  Date of Service: .03/27/2021   Patient Care Team: Virgie Dad, MD as PCP - General (Internal Medicine)  CHIEF COMPLAINTS/PURPOSE OF CONSULTATION:  Follow-up for management of iron and B12 deficiency.  HISTORY OF PRESENTING ILLNESS:  Amy Carroll is a wonderful 87 y.o. female who has been referred to [place] by Dr .Lyndel Safe, Rene Kocher, MD  for evaluation and management of iron deficiency anemia to optimize her hemoglobin prior to potential knee replacement.  Patient has a history of hypertension, arthritis, prediabetes, B12 deficiency had recent labs with her primary care physician on 10/26/2020 including CBC which showed a hemoglobin of 11.1, MCV of 93.9 with a WBC count of 5.9k and platelets of 349k. Her recent vitamin B12 levels were 203. Iron level was noted to be 33 and we did not have access to an iron saturation or ferritin level. She was started on vitamin B12 to 50 mcg sublingual daily and 2 [place] Iron tablets daily over the last 2 to 3 weeks. Patient notes that her energy levels have improved significantly.  Patient notes no overt evidence of bleeding.  No melena no other overt evidence of GI bleeding no hematuria no epistaxis no gum bleeding. Fecal occult blood testing done with primary care physician on 11/09/2020 was negative.  She notes that she has been scheduled for left total knee arthroplasty on 12/12/2020 with Dr. Hector Shade.  Patient notes no other acute new symptoms.  Notes that other than her knee issues she has been quite active even at her age. No sudden weight loss or other acute new focal symptoms.  INTERVAL HISTORY  Amy Carroll is here for follow-up of her iron deficiency anemia.  She was able to have her left total knee arthroplasty on 12/12/2020 as planned with Dr. Maureen Ralphs.  She unfortunately had to have a repeat surgery on 01/16/2021 involving a left patellar tendon reconstruction.  Patient notes  no overt blood loss other than expected surgical blood loss.  No GI bleeding no gum bleeds no epistaxis no hematuria.  Labs today show improvement in her hemoglobin up to 11.3 from her previous low of 8.3.  Normal WBC count and platelets CMP within normal limits Ferritin 280 with an iron saturation of 26% up from 13%. Patient has no other acute new symptoms.  MEDICAL HISTORY:  Past Medical History:  Diagnosis Date   Dementia ([place])    Gait abnormality 06/20/2020   Hypertension    Hyponatremia    Iron deficiency anemia    Major depression    OA (osteoarthritis)    Pre-diabetes    Vitamin B 12 deficiency     SURGICAL HISTORY: Past Surgical History:  Procedure Laterality Date   ABDOMINAL HYSTERECTOMY     COLONOSCOPY     HEMANGIOMA EXCISION     JOINT REPLACEMENT     PATELLAR TENDON REPAIR Left 01/16/2021   Procedure: Left patellar tendon reconstruction;  Surgeon: Gaynelle Arabian, MD;  [facility];  Service: Orthopedics;  Laterality: Left;   TONSILLECTOMY     TOTAL HIP ARTHROPLASTY Right 02/18/2019   Procedure: TOTAL HIP ARTHROPLASTY ANTERIOR APPROACH;  Surgeon: Gaynelle Arabian, MD;  [facility];  Service: Orthopedics;  Laterality: Right;  171min   TOTAL KNEE ARTHROPLASTY Right 05/27/2012   Procedure: TOTAL KNEE ARTHROPLASTY;  Surgeon: Meredith Pel, MD;  Location: [facility];  Service: Orthopedics;  Laterality: Right;  Right Total Knee Arthroplasty   TOTAL KNEE ARTHROPLASTY Left 12/12/2020  Procedure: TOTAL KNEE ARTHROPLASTY;  Surgeon: Gaynelle Arabian, MD;  [facility];  Service: Orthopedics;  Laterality: Left;   TOTAL KNEE REVISION Right 08/06/2018   Procedure: TOTAL KNEE REVISION;  Surgeon: Gaynelle Arabian, MD;  [facility];  Service: Orthopedics;  Laterality: Right;   TUBAL LIGATION      SOCIAL HISTORY: Social History   Socioeconomic History   Marital status: Married    Spouse name: Shanon Brow   Number of children: Not on file   Years of education: Not on file    Highest education level: Not on file  Occupational History   Not on file  Tobacco Use   Smoking status: Former    Types: Cigarettes    Quit date: 1999    Years since quitting: 24.1   Smokeless tobacco: Never  Vaping Use   Vaping Use: Never used  Substance and Sexual Activity   Alcohol use: Yes    Alcohol/week: 1.0 standard drink    Types: 1 Glasses of wine per week    Comment: daily   Drug use: No   Sexual activity: Not on file  Other Topics Concern   Not on file  Social History Narrative   Lives with husband   Right Handed   Drinks caffeine rarely   Social Determinants of Health   Financial Resource Strain: Not on file  Food Insecurity: Not on file  Transportation Needs: Not on file  Physical Activity: Not on file  Stress: Not on file  Social Connections: Not on file  Intimate Partner Violence: Not on file    FAMILY HISTORY: Family History  Problem Relation Age of Onset   Cancer Mother    Heart disease Father    Cancer Brother    Asthma Brother     ALLERGIES:  has No Known Allergies.  MEDICATIONS:  Current Outpatient Medications  Medication Sig Dispense Refill   acetaminophen (TYLENOL) 325 MG tablet Take 650 mg by mouth 2 (two) times daily.     acetaminophen (TYLENOL) 500 MG tablet Take 500 mg by mouth every 6 (six) hours as needed for moderate pain.     amLODipine (NORVASC) 5 MG tablet Take 5 mg by mouth in the morning.     aspirin EC 81 MG tablet Take 81 mg by mouth daily.     cholecalciferol (VITAMIN D3) 25 MCG (1000 UT) tablet Take 1,000 Units by mouth daily.     COMBIGAN 0.2-0.5 % ophthalmic solution Place 1 drop into the right eye in the morning and at bedtime.     Cyanocobalamin (B-12) 2500 MCG TABS Take 1 tablet by mouth daily.     dorzolamide (TRUSOPT) 2 % ophthalmic solution Place 1 drop into the right eye 2 (two) times daily.      ferrous sulfate 325 (65 FE) MG tablet Take 325 mg by mouth daily with breakfast.     lactose free nutrition  (BOOST) LIQD Take 237 mLs by mouth daily.     latanoprost (XALATAN) 0.005 % ophthalmic solution Place 1 drop into both eyes at bedtime.      methocarbamol (ROBAXIN) 500 MG tablet Take 250 mg by mouth every 6 (six) hours as needed for muscle spasms.     Multiple Vitamins-Minerals (PRESERVISION AREDS 2 PO) Take 1 capsule by mouth in the morning and at bedtime.     olmesartan (BENICAR) 40 MG tablet Take 1 tablet (40 mg total) by mouth daily. 90 tablet 0   polyethylene glycol (MIRALAX / GLYCOLAX) 17 g packet  Take 17 g by mouth daily. In the morning between 8-11 am     No current facility-administered medications for this visit.    REVIEW OF SYSTEMS:    10 Point review of Systems was done is negative except as noted above.  PHYSICAL EXAMINATION:  .BP 124/70 (BP Location: Right Arm, Patient Position: Sitting)    Pulse 82    Temp 98.5 F (36.9 C) (Tympanic)    Resp 14    SpO2 100%  . GENERAL:alert, in no acute distress and comfortable SKIN: no acute rashes, no significant lesions EYES: conjunctiva are pink and non-injected, sclera anicteric OROPHARYNX: MMM, no exudates, no oropharyngeal erythema or ulceration NECK: supple, no JVD LYMPH:  no palpable lymphadenopathy in the cervical, axillary or inguinal regions LUNGS: clear to auscultation b/l with normal respiratory effort HEART: regular rate & rhythm ABDOMEN:  normoactive bowel sounds , non tender, not distended.  LABORATORY DATA:  I have reviewed the data as listed  .Marland Kitchen CBC Latest Ref Rng & Units 03/27/2021 01/26/2021 01/18/2021  WBC 4.0 - 10.5 K/uL 4.2 5.1 6.5  Hemoglobin 12.0 - 15.0 g/dL 11.3(L) 9.4(A) 8.3(L)  Hematocrit 36.0 - 46.0 % 34.4(L) 30(A) 25.6(L)  Platelets 150 - 400 K/uL 229 327 214   . CMP Latest Ref Rng & Units 03/27/2021 01/26/2021 01/18/2021  Glucose 70 - 99 mg/dL 94 - 103(H)  BUN 8 - 23 mg/dL 15 11 10   Creatinine 0.44 - 1.00 mg/dL 0.81 0.5 0.51  Sodium 135 - 145 mmol/L 139 138 132(L)  Potassium 3.5 - 5.1 mmol/L 4.4  4.5 3.3(L)  Chloride 98 - 111 mmol/L 104 104 100  CO2 22 - 32 mmol/L 29 24(A) 27  Calcium 8.9 - 10.3 mg/dL 9.1 8.7 8.2(L)  Total Protein 6.5 - 8.1 g/dL 6.9 - -  Total Bilirubin 0.3 - 1.2 mg/dL 0.3 - -  Alkaline Phos 38 - 126 U/L 60 - -  AST 15 - 41 U/L 17 - -  ALT 0 - 44 U/L 19 - -     B12 -- 203 on 10/26/2020  . Lab Results  Component Value Date   IRON 61 03/27/2021   TIBC 235 (L) 03/27/2021   IRONPCTSAT 26 03/27/2021   (Iron and TIBC)  Lab Results  Component Value Date   FERRITIN 280 03/27/2021     RADIOGRAPHIC STUDIES: I have personally reviewed the radiological images as listed and agreed with the findings in the report. No results found.  ASSESSMENT & PLAN:   87 year old female with history of hypertension, prediabetes, arthritis, gait disorder/weakness, mild memory issues referred for evaluation of  #1 Normocytic anemia Labs today show hemoglobin of 9.8 with an MCV of 92.5. (Has previously been macrocytic based on labs in 2020) She recently was noted to be B12 deficient with a level of 203 on 10/26/2020--repeat labs show improving B12 level with sublingual replacement. 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 Her stool occult blood testing x1 was negative with her primary care physician. Cannot rule out an element of MDS given the patient's age and also some anemia of chronic kidney disease though her creatinine number per se is within normal limits. PLAN -Patient's labs from today were reviewed hemoglobin is improved to 11.3.  Normal WBC count and platelets. -Iron labs are improved after her IV iron replacement. -Patient is appropriate to continue p.o. iron replacement and sublingual B12 at this point as per  her primary care physician. -If iron deficiency outside of expected blood loss from surgery were to arise will defer to her primary care physician  about need for GI work-up. -Would also recommend continuing her vitamin B complex 1 capsule p.o. daily. -At this point as per patient's wishes we will have her continue follow-up with her primary care physician for continued monitoring of her blood counts. -If she were to get progressively more anemic or develops other cytopenias despite optimal iron and B12 replacement then we shall be happy to reevaluate her. -In that situation she might need a potential bone marrow examination to rule out MDS in the context of her age. Return clinic with Dr. Irene Limbo as needed   Follow-up Follow-up with PCP return to clinic with Dr. Irene Limbo as needed.  All of the patients questions were answered with apparent satisfaction. The patient knows to call the clinic with any problems, questions or concerns.    Sullivan Lone MD [place] AAHIVMS [facility] Hematology/Oncology Physician [facility]

## 2021-04-03 DIAGNOSIS — R41840 Attention and concentration deficit: Secondary | ICD-10-CM | POA: Diagnosis not present

## 2021-04-03 DIAGNOSIS — R278 Other lack of coordination: Secondary | ICD-10-CM | POA: Diagnosis not present

## 2021-04-03 DIAGNOSIS — Z471 Aftercare following joint replacement surgery: Secondary | ICD-10-CM | POA: Diagnosis not present

## 2021-04-03 DIAGNOSIS — R2689 Other abnormalities of gait and mobility: Secondary | ICD-10-CM | POA: Diagnosis not present

## 2021-04-03 DIAGNOSIS — Z4789 Encounter for other orthopedic aftercare: Secondary | ICD-10-CM | POA: Diagnosis not present

## 2021-04-03 DIAGNOSIS — M66862 Spontaneous rupture of other tendons, left lower leg: Secondary | ICD-10-CM | POA: Diagnosis not present

## 2021-04-03 DIAGNOSIS — Z96652 Presence of left artificial knee joint: Secondary | ICD-10-CM | POA: Diagnosis not present

## 2021-04-03 DIAGNOSIS — M62562 Muscle wasting and atrophy, not elsewhere classified, left lower leg: Secondary | ICD-10-CM | POA: Diagnosis not present

## 2021-04-03 DIAGNOSIS — M1712 Unilateral primary osteoarthritis, left knee: Secondary | ICD-10-CM | POA: Diagnosis not present

## 2021-04-04 DIAGNOSIS — M1712 Unilateral primary osteoarthritis, left knee: Secondary | ICD-10-CM | POA: Diagnosis not present

## 2021-04-04 DIAGNOSIS — Z471 Aftercare following joint replacement surgery: Secondary | ICD-10-CM | POA: Diagnosis not present

## 2021-04-04 DIAGNOSIS — R278 Other lack of coordination: Secondary | ICD-10-CM | POA: Diagnosis not present

## 2021-04-04 DIAGNOSIS — R2689 Other abnormalities of gait and mobility: Secondary | ICD-10-CM | POA: Diagnosis not present

## 2021-04-04 DIAGNOSIS — M62562 Muscle wasting and atrophy, not elsewhere classified, left lower leg: Secondary | ICD-10-CM | POA: Diagnosis not present

## 2021-04-04 DIAGNOSIS — Z4789 Encounter for other orthopedic aftercare: Secondary | ICD-10-CM | POA: Diagnosis not present

## 2021-04-05 DIAGNOSIS — M66862 Spontaneous rupture of other tendons, left lower leg: Secondary | ICD-10-CM | POA: Diagnosis not present

## 2021-04-05 DIAGNOSIS — Z471 Aftercare following joint replacement surgery: Secondary | ICD-10-CM | POA: Diagnosis not present

## 2021-04-05 DIAGNOSIS — R278 Other lack of coordination: Secondary | ICD-10-CM | POA: Diagnosis not present

## 2021-04-05 DIAGNOSIS — M1712 Unilateral primary osteoarthritis, left knee: Secondary | ICD-10-CM | POA: Diagnosis not present

## 2021-04-05 DIAGNOSIS — R41840 Attention and concentration deficit: Secondary | ICD-10-CM | POA: Diagnosis not present

## 2021-04-05 DIAGNOSIS — Z96652 Presence of left artificial knee joint: Secondary | ICD-10-CM | POA: Diagnosis not present

## 2021-04-05 DIAGNOSIS — R2689 Other abnormalities of gait and mobility: Secondary | ICD-10-CM | POA: Diagnosis not present

## 2021-04-05 DIAGNOSIS — M62562 Muscle wasting and atrophy, not elsewhere classified, left lower leg: Secondary | ICD-10-CM | POA: Diagnosis not present

## 2021-04-05 DIAGNOSIS — Z4789 Encounter for other orthopedic aftercare: Secondary | ICD-10-CM | POA: Diagnosis not present

## 2021-04-06 DIAGNOSIS — Z471 Aftercare following joint replacement surgery: Secondary | ICD-10-CM | POA: Diagnosis not present

## 2021-04-06 DIAGNOSIS — Z4789 Encounter for other orthopedic aftercare: Secondary | ICD-10-CM | POA: Diagnosis not present

## 2021-04-06 DIAGNOSIS — R278 Other lack of coordination: Secondary | ICD-10-CM | POA: Diagnosis not present

## 2021-04-06 DIAGNOSIS — M62562 Muscle wasting and atrophy, not elsewhere classified, left lower leg: Secondary | ICD-10-CM | POA: Diagnosis not present

## 2021-04-06 DIAGNOSIS — M1712 Unilateral primary osteoarthritis, left knee: Secondary | ICD-10-CM | POA: Diagnosis not present

## 2021-04-06 DIAGNOSIS — R2689 Other abnormalities of gait and mobility: Secondary | ICD-10-CM | POA: Diagnosis not present

## 2021-04-06 DIAGNOSIS — Z96652 Presence of left artificial knee joint: Secondary | ICD-10-CM | POA: Diagnosis not present

## 2021-04-06 DIAGNOSIS — R41840 Attention and concentration deficit: Secondary | ICD-10-CM | POA: Diagnosis not present

## 2021-04-06 DIAGNOSIS — M66862 Spontaneous rupture of other tendons, left lower leg: Secondary | ICD-10-CM | POA: Diagnosis not present

## 2021-04-07 DIAGNOSIS — Z471 Aftercare following joint replacement surgery: Secondary | ICD-10-CM | POA: Diagnosis not present

## 2021-04-07 DIAGNOSIS — R41840 Attention and concentration deficit: Secondary | ICD-10-CM | POA: Diagnosis not present

## 2021-04-07 DIAGNOSIS — M66862 Spontaneous rupture of other tendons, left lower leg: Secondary | ICD-10-CM | POA: Diagnosis not present

## 2021-04-07 DIAGNOSIS — M1712 Unilateral primary osteoarthritis, left knee: Secondary | ICD-10-CM | POA: Diagnosis not present

## 2021-04-07 DIAGNOSIS — R2689 Other abnormalities of gait and mobility: Secondary | ICD-10-CM | POA: Diagnosis not present

## 2021-04-07 DIAGNOSIS — M62562 Muscle wasting and atrophy, not elsewhere classified, left lower leg: Secondary | ICD-10-CM | POA: Diagnosis not present

## 2021-04-07 DIAGNOSIS — R278 Other lack of coordination: Secondary | ICD-10-CM | POA: Diagnosis not present

## 2021-04-07 DIAGNOSIS — Z4789 Encounter for other orthopedic aftercare: Secondary | ICD-10-CM | POA: Diagnosis not present

## 2021-04-07 DIAGNOSIS — Z96652 Presence of left artificial knee joint: Secondary | ICD-10-CM | POA: Diagnosis not present

## 2021-04-10 DIAGNOSIS — Z96652 Presence of left artificial knee joint: Secondary | ICD-10-CM | POA: Diagnosis not present

## 2021-04-10 DIAGNOSIS — M1712 Unilateral primary osteoarthritis, left knee: Secondary | ICD-10-CM | POA: Diagnosis not present

## 2021-04-10 DIAGNOSIS — R41840 Attention and concentration deficit: Secondary | ICD-10-CM | POA: Diagnosis not present

## 2021-04-10 DIAGNOSIS — Z471 Aftercare following joint replacement surgery: Secondary | ICD-10-CM | POA: Diagnosis not present

## 2021-04-10 DIAGNOSIS — M66862 Spontaneous rupture of other tendons, left lower leg: Secondary | ICD-10-CM | POA: Diagnosis not present

## 2021-04-10 DIAGNOSIS — M62562 Muscle wasting and atrophy, not elsewhere classified, left lower leg: Secondary | ICD-10-CM | POA: Diagnosis not present

## 2021-04-10 DIAGNOSIS — Z4789 Encounter for other orthopedic aftercare: Secondary | ICD-10-CM | POA: Diagnosis not present

## 2021-04-10 DIAGNOSIS — R278 Other lack of coordination: Secondary | ICD-10-CM | POA: Diagnosis not present

## 2021-04-10 DIAGNOSIS — R2689 Other abnormalities of gait and mobility: Secondary | ICD-10-CM | POA: Diagnosis not present

## 2021-04-11 DIAGNOSIS — R278 Other lack of coordination: Secondary | ICD-10-CM | POA: Diagnosis not present

## 2021-04-11 DIAGNOSIS — R2689 Other abnormalities of gait and mobility: Secondary | ICD-10-CM | POA: Diagnosis not present

## 2021-04-11 DIAGNOSIS — M1712 Unilateral primary osteoarthritis, left knee: Secondary | ICD-10-CM | POA: Diagnosis not present

## 2021-04-11 DIAGNOSIS — Z96652 Presence of left artificial knee joint: Secondary | ICD-10-CM | POA: Diagnosis not present

## 2021-04-11 DIAGNOSIS — M66862 Spontaneous rupture of other tendons, left lower leg: Secondary | ICD-10-CM | POA: Diagnosis not present

## 2021-04-11 DIAGNOSIS — Z471 Aftercare following joint replacement surgery: Secondary | ICD-10-CM | POA: Diagnosis not present

## 2021-04-11 DIAGNOSIS — M62562 Muscle wasting and atrophy, not elsewhere classified, left lower leg: Secondary | ICD-10-CM | POA: Diagnosis not present

## 2021-04-11 DIAGNOSIS — Z4789 Encounter for other orthopedic aftercare: Secondary | ICD-10-CM | POA: Diagnosis not present

## 2021-04-11 DIAGNOSIS — R41840 Attention and concentration deficit: Secondary | ICD-10-CM | POA: Diagnosis not present

## 2021-04-11 DIAGNOSIS — Z5189 Encounter for other specified aftercare: Secondary | ICD-10-CM | POA: Diagnosis not present

## 2021-04-12 DIAGNOSIS — R278 Other lack of coordination: Secondary | ICD-10-CM | POA: Diagnosis not present

## 2021-04-12 DIAGNOSIS — M1712 Unilateral primary osteoarthritis, left knee: Secondary | ICD-10-CM | POA: Diagnosis not present

## 2021-04-12 DIAGNOSIS — M62562 Muscle wasting and atrophy, not elsewhere classified, left lower leg: Secondary | ICD-10-CM | POA: Diagnosis not present

## 2021-04-12 DIAGNOSIS — R2689 Other abnormalities of gait and mobility: Secondary | ICD-10-CM | POA: Diagnosis not present

## 2021-04-12 DIAGNOSIS — Z4789 Encounter for other orthopedic aftercare: Secondary | ICD-10-CM | POA: Diagnosis not present

## 2021-04-12 DIAGNOSIS — Z471 Aftercare following joint replacement surgery: Secondary | ICD-10-CM | POA: Diagnosis not present

## 2021-04-13 DIAGNOSIS — M66862 Spontaneous rupture of other tendons, left lower leg: Secondary | ICD-10-CM | POA: Diagnosis not present

## 2021-04-13 DIAGNOSIS — R41840 Attention and concentration deficit: Secondary | ICD-10-CM | POA: Diagnosis not present

## 2021-04-13 DIAGNOSIS — Z4789 Encounter for other orthopedic aftercare: Secondary | ICD-10-CM | POA: Diagnosis not present

## 2021-04-13 DIAGNOSIS — R278 Other lack of coordination: Secondary | ICD-10-CM | POA: Diagnosis not present

## 2021-04-13 DIAGNOSIS — M62562 Muscle wasting and atrophy, not elsewhere classified, left lower leg: Secondary | ICD-10-CM | POA: Diagnosis not present

## 2021-04-13 DIAGNOSIS — R2689 Other abnormalities of gait and mobility: Secondary | ICD-10-CM | POA: Diagnosis not present

## 2021-04-13 DIAGNOSIS — M1712 Unilateral primary osteoarthritis, left knee: Secondary | ICD-10-CM | POA: Diagnosis not present

## 2021-04-13 DIAGNOSIS — Z96652 Presence of left artificial knee joint: Secondary | ICD-10-CM | POA: Diagnosis not present

## 2021-04-13 DIAGNOSIS — Z471 Aftercare following joint replacement surgery: Secondary | ICD-10-CM | POA: Diagnosis not present

## 2021-04-14 ENCOUNTER — Other Ambulatory Visit: Payer: Self-pay

## 2021-04-14 MED ORDER — AMLODIPINE BESYLATE 5 MG PO TABS: 5.0000 mg | ORAL_TABLET | Freq: Every morning | ORAL | 1 refills | Status: DC

## 2021-04-14 NOTE — Telephone Encounter (Signed)
[facility] request [facility]ill of [facility]

## 2021-04-17 DIAGNOSIS — M62562 Muscle wasting and atrophy, not elsewhere classified, left lower leg: Secondary | ICD-10-CM | POA: Diagnosis not present

## 2021-04-17 DIAGNOSIS — Z471 Aftercare following joint replacement surgery: Secondary | ICD-10-CM | POA: Diagnosis not present

## 2021-04-17 DIAGNOSIS — R278 Other lack of coordination: Secondary | ICD-10-CM | POA: Diagnosis not present

## 2021-04-17 DIAGNOSIS — R41840 Attention and concentration deficit: Secondary | ICD-10-CM | POA: Diagnosis not present

## 2021-04-17 DIAGNOSIS — M66862 Spontaneous rupture of other tendons, left lower leg: Secondary | ICD-10-CM | POA: Diagnosis not present

## 2021-04-17 DIAGNOSIS — Z96652 Presence of left artificial knee joint: Secondary | ICD-10-CM | POA: Diagnosis not present

## 2021-04-17 DIAGNOSIS — M1712 Unilateral primary osteoarthritis, left knee: Secondary | ICD-10-CM | POA: Diagnosis not present

## 2021-04-19 DIAGNOSIS — Z4789 Encounter for other orthopedic aftercare: Secondary | ICD-10-CM | POA: Diagnosis not present

## 2021-04-19 DIAGNOSIS — M66862 Spontaneous rupture of other tendons, left lower leg: Secondary | ICD-10-CM | POA: Diagnosis not present

## 2021-04-19 DIAGNOSIS — Z96652 Presence of left artificial knee joint: Secondary | ICD-10-CM | POA: Diagnosis not present

## 2021-04-19 DIAGNOSIS — M1712 Unilateral primary osteoarthritis, left knee: Secondary | ICD-10-CM | POA: Diagnosis not present

## 2021-04-19 DIAGNOSIS — R2689 Other abnormalities of gait and mobility: Secondary | ICD-10-CM | POA: Diagnosis not present

## 2021-04-19 DIAGNOSIS — R41840 Attention and concentration deficit: Secondary | ICD-10-CM | POA: Diagnosis not present

## 2021-04-19 DIAGNOSIS — M62562 Muscle wasting and atrophy, not elsewhere classified, left lower leg: Secondary | ICD-10-CM | POA: Diagnosis not present

## 2021-04-19 DIAGNOSIS — Z471 Aftercare following joint replacement surgery: Secondary | ICD-10-CM | POA: Diagnosis not present

## 2021-04-19 DIAGNOSIS — R278 Other lack of coordination: Secondary | ICD-10-CM | POA: Diagnosis not present

## 2021-04-20 DIAGNOSIS — Z471 Aftercare following joint replacement surgery: Secondary | ICD-10-CM | POA: Diagnosis not present

## 2021-04-20 DIAGNOSIS — M62562 Muscle wasting and atrophy, not elsewhere classified, left lower leg: Secondary | ICD-10-CM | POA: Diagnosis not present

## 2021-04-20 DIAGNOSIS — M1712 Unilateral primary osteoarthritis, left knee: Secondary | ICD-10-CM | POA: Diagnosis not present

## 2021-04-20 DIAGNOSIS — Z4789 Encounter for other orthopedic aftercare: Secondary | ICD-10-CM | POA: Diagnosis not present

## 2021-04-20 DIAGNOSIS — R2689 Other abnormalities of gait and mobility: Secondary | ICD-10-CM | POA: Diagnosis not present

## 2021-04-20 DIAGNOSIS — R278 Other lack of coordination: Secondary | ICD-10-CM | POA: Diagnosis not present

## 2021-04-21 DIAGNOSIS — M1712 Unilateral primary osteoarthritis, left knee: Secondary | ICD-10-CM | POA: Diagnosis not present

## 2021-04-21 DIAGNOSIS — Z4789 Encounter for other orthopedic aftercare: Secondary | ICD-10-CM | POA: Diagnosis not present

## 2021-04-21 DIAGNOSIS — M66862 Spontaneous rupture of other tendons, left lower leg: Secondary | ICD-10-CM | POA: Diagnosis not present

## 2021-04-21 DIAGNOSIS — Z96652 Presence of left artificial knee joint: Secondary | ICD-10-CM | POA: Diagnosis not present

## 2021-04-21 DIAGNOSIS — Z471 Aftercare following joint replacement surgery: Secondary | ICD-10-CM | POA: Diagnosis not present

## 2021-04-21 DIAGNOSIS — R278 Other lack of coordination: Secondary | ICD-10-CM | POA: Diagnosis not present

## 2021-04-21 DIAGNOSIS — R41840 Attention and concentration deficit: Secondary | ICD-10-CM | POA: Diagnosis not present

## 2021-04-21 DIAGNOSIS — M62562 Muscle wasting and atrophy, not elsewhere classified, left lower leg: Secondary | ICD-10-CM | POA: Diagnosis not present

## 2021-04-21 DIAGNOSIS — R2689 Other abnormalities of gait and mobility: Secondary | ICD-10-CM | POA: Diagnosis not present

## 2021-04-24 DIAGNOSIS — Z4789 Encounter for other orthopedic aftercare: Secondary | ICD-10-CM | POA: Diagnosis not present

## 2021-04-24 DIAGNOSIS — M62562 Muscle wasting and atrophy, not elsewhere classified, left lower leg: Secondary | ICD-10-CM | POA: Diagnosis not present

## 2021-04-24 DIAGNOSIS — R2689 Other abnormalities of gait and mobility: Secondary | ICD-10-CM | POA: Diagnosis not present

## 2021-04-24 DIAGNOSIS — M1712 Unilateral primary osteoarthritis, left knee: Secondary | ICD-10-CM | POA: Diagnosis not present

## 2021-04-24 DIAGNOSIS — R278 Other lack of coordination: Secondary | ICD-10-CM | POA: Diagnosis not present

## 2021-04-24 DIAGNOSIS — Z471 Aftercare following joint replacement surgery: Secondary | ICD-10-CM | POA: Diagnosis not present

## 2021-04-25 DIAGNOSIS — M62562 Muscle wasting and atrophy, not elsewhere classified, left lower leg: Secondary | ICD-10-CM | POA: Diagnosis not present

## 2021-04-25 DIAGNOSIS — R41840 Attention and concentration deficit: Secondary | ICD-10-CM | POA: Diagnosis not present

## 2021-04-25 DIAGNOSIS — Z471 Aftercare following joint replacement surgery: Secondary | ICD-10-CM | POA: Diagnosis not present

## 2021-04-25 DIAGNOSIS — M1712 Unilateral primary osteoarthritis, left knee: Secondary | ICD-10-CM | POA: Diagnosis not present

## 2021-04-25 DIAGNOSIS — R278 Other lack of coordination: Secondary | ICD-10-CM | POA: Diagnosis not present

## 2021-04-25 DIAGNOSIS — Z96652 Presence of left artificial knee joint: Secondary | ICD-10-CM | POA: Diagnosis not present

## 2021-04-25 DIAGNOSIS — M66862 Spontaneous rupture of other tendons, left lower leg: Secondary | ICD-10-CM | POA: Diagnosis not present

## 2021-04-26 DIAGNOSIS — R278 Other lack of coordination: Secondary | ICD-10-CM | POA: Diagnosis not present

## 2021-04-26 DIAGNOSIS — R2689 Other abnormalities of gait and mobility: Secondary | ICD-10-CM | POA: Diagnosis not present

## 2021-04-26 DIAGNOSIS — Z471 Aftercare following joint replacement surgery: Secondary | ICD-10-CM | POA: Diagnosis not present

## 2021-04-26 DIAGNOSIS — M62562 Muscle wasting and atrophy, not elsewhere classified, left lower leg: Secondary | ICD-10-CM | POA: Diagnosis not present

## 2021-04-26 DIAGNOSIS — M1712 Unilateral primary osteoarthritis, left knee: Secondary | ICD-10-CM | POA: Diagnosis not present

## 2021-04-26 DIAGNOSIS — Z4789 Encounter for other orthopedic aftercare: Secondary | ICD-10-CM | POA: Diagnosis not present

## 2021-04-27 DIAGNOSIS — Z471 Aftercare following joint replacement surgery: Secondary | ICD-10-CM | POA: Diagnosis not present

## 2021-04-27 DIAGNOSIS — R278 Other lack of coordination: Secondary | ICD-10-CM | POA: Diagnosis not present

## 2021-04-27 DIAGNOSIS — M66862 Spontaneous rupture of other tendons, left lower leg: Secondary | ICD-10-CM | POA: Diagnosis not present

## 2021-04-27 DIAGNOSIS — R2689 Other abnormalities of gait and mobility: Secondary | ICD-10-CM | POA: Diagnosis not present

## 2021-04-27 DIAGNOSIS — Z96652 Presence of left artificial knee joint: Secondary | ICD-10-CM | POA: Diagnosis not present

## 2021-04-27 DIAGNOSIS — R41840 Attention and concentration deficit: Secondary | ICD-10-CM | POA: Diagnosis not present

## 2021-04-27 DIAGNOSIS — M62562 Muscle wasting and atrophy, not elsewhere classified, left lower leg: Secondary | ICD-10-CM | POA: Diagnosis not present

## 2021-04-27 DIAGNOSIS — Z4789 Encounter for other orthopedic aftercare: Secondary | ICD-10-CM | POA: Diagnosis not present

## 2021-04-27 DIAGNOSIS — M1712 Unilateral primary osteoarthritis, left knee: Secondary | ICD-10-CM | POA: Diagnosis not present

## 2021-05-01 DIAGNOSIS — Z471 Aftercare following joint replacement surgery: Secondary | ICD-10-CM | POA: Diagnosis not present

## 2021-05-01 DIAGNOSIS — R278 Other lack of coordination: Secondary | ICD-10-CM | POA: Diagnosis not present

## 2021-05-01 DIAGNOSIS — M1712 Unilateral primary osteoarthritis, left knee: Secondary | ICD-10-CM | POA: Diagnosis not present

## 2021-05-01 DIAGNOSIS — R2689 Other abnormalities of gait and mobility: Secondary | ICD-10-CM | POA: Diagnosis not present

## 2021-05-01 DIAGNOSIS — Z4789 Encounter for other orthopedic aftercare: Secondary | ICD-10-CM | POA: Diagnosis not present

## 2021-05-01 DIAGNOSIS — M62562 Muscle wasting and atrophy, not elsewhere classified, left lower leg: Secondary | ICD-10-CM | POA: Diagnosis not present

## 2021-05-02 DIAGNOSIS — M62562 Muscle wasting and atrophy, not elsewhere classified, left lower leg: Secondary | ICD-10-CM | POA: Diagnosis not present

## 2021-05-02 DIAGNOSIS — Z471 Aftercare following joint replacement surgery: Secondary | ICD-10-CM | POA: Diagnosis not present

## 2021-05-02 DIAGNOSIS — Z4789 Encounter for other orthopedic aftercare: Secondary | ICD-10-CM | POA: Diagnosis not present

## 2021-05-02 DIAGNOSIS — M1712 Unilateral primary osteoarthritis, left knee: Secondary | ICD-10-CM | POA: Diagnosis not present

## 2021-05-02 DIAGNOSIS — R278 Other lack of coordination: Secondary | ICD-10-CM | POA: Diagnosis not present

## 2021-05-02 DIAGNOSIS — R2689 Other abnormalities of gait and mobility: Secondary | ICD-10-CM | POA: Diagnosis not present

## 2021-05-03 ENCOUNTER — Other Ambulatory Visit: Payer: Self-pay

## 2021-05-03 ENCOUNTER — Encounter: Payer: Self-pay | Admitting: Internal Medicine

## 2021-05-03 ENCOUNTER — Non-Acute Institutional Stay: Payer: Medicare Other | Admitting: Internal Medicine

## 2021-05-03 VITALS — BP 122/72 | HR 78 | Temp 98.1°F | Ht 64.0 in

## 2021-05-03 DIAGNOSIS — R413 Other amnesia: Secondary | ICD-10-CM | POA: Diagnosis not present

## 2021-05-03 DIAGNOSIS — I10 Essential (primary) hypertension: Secondary | ICD-10-CM | POA: Diagnosis not present

## 2021-05-03 DIAGNOSIS — E871 Hypo-osmolality and hyponatremia: Secondary | ICD-10-CM | POA: Diagnosis not present

## 2021-05-03 DIAGNOSIS — Z96652 Presence of left artificial knee joint: Secondary | ICD-10-CM | POA: Diagnosis not present

## 2021-05-03 DIAGNOSIS — D508 Other iron deficiency anemias: Secondary | ICD-10-CM | POA: Diagnosis not present

## 2021-05-03 DIAGNOSIS — E538 Deficiency of other specified B group vitamins: Secondary | ICD-10-CM | POA: Diagnosis not present

## 2021-05-03 DIAGNOSIS — S86812S Strain of other muscle(s) and tendon(s) at lower leg level, left leg, sequela: Secondary | ICD-10-CM | POA: Diagnosis not present

## 2021-05-03 DIAGNOSIS — R350 Frequency of micturition: Secondary | ICD-10-CM | POA: Diagnosis not present

## 2021-05-03 MED ORDER — METHOCARBAMOL 500 MG PO TABS: 250.0000 mg | ORAL_TABLET | Freq: Every evening | ORAL | 0 refills | Status: DC | PRN

## 2021-05-04 DIAGNOSIS — R41840 Attention and concentration deficit: Secondary | ICD-10-CM | POA: Diagnosis not present

## 2021-05-04 DIAGNOSIS — M1712 Unilateral primary osteoarthritis, left knee: Secondary | ICD-10-CM | POA: Diagnosis not present

## 2021-05-04 DIAGNOSIS — R278 Other lack of coordination: Secondary | ICD-10-CM | POA: Diagnosis not present

## 2021-05-04 DIAGNOSIS — M66862 Spontaneous rupture of other tendons, left lower leg: Secondary | ICD-10-CM | POA: Diagnosis not present

## 2021-05-04 DIAGNOSIS — Z471 Aftercare following joint replacement surgery: Secondary | ICD-10-CM | POA: Diagnosis not present

## 2021-05-04 DIAGNOSIS — R2689 Other abnormalities of gait and mobility: Secondary | ICD-10-CM | POA: Diagnosis not present

## 2021-05-04 DIAGNOSIS — Z96652 Presence of left artificial knee joint: Secondary | ICD-10-CM | POA: Diagnosis not present

## 2021-05-04 DIAGNOSIS — M62562 Muscle wasting and atrophy, not elsewhere classified, left lower leg: Secondary | ICD-10-CM | POA: Diagnosis not present

## 2021-05-04 DIAGNOSIS — Z4789 Encounter for other orthopedic aftercare: Secondary | ICD-10-CM | POA: Diagnosis not present

## 2021-05-05 NOTE — Progress Notes (Signed)
Location: [facility] Service:  Clinic (12)  Provider:   Code Status: DNR Goals of Care:  Advanced Directives 05/03/2021  Does Patient Have a Medical Advance Directive? Yes  Type of Paramedic of [place];Living will;Out of facility DNR (pink MOST or yellow form)  Does patient want to make changes to medical advance directive? No - Patient declined  Copy of [place] in Chart? Yes - validated most recent copy scanned in chart (See row information)  Would patient like information on creating a medical advance directive? -  Pre-existing out of facility DNR order (yellow form or pink MOST form) Yellow form placed in chart (order not valid for inpatient use)     Chief Complaint  Patient presents with   Medical Management of Chronic Issues    Patient here today with her husband to establish care, recently discharged from rehab. Unable to confirm mediations.     HPI: Patient is a 87 y.o. female seen today for an establish Care  She Underwent Left Patellar Repair on 11/21  Admitted in the hospital from 10/17-10/19 for left  total knee arthroplasty Also admitted in the hospital from 10/26 through 10/28 for hyponatremia and left knee swelling  Patient also has history of anemia multifactorial due to iron deficiency and B12 follows with hematology and gets iron infusions as needed Also has history of hypertension and arthritis   She was electively admitted in the hospital for left TKA. Was sent back to the hospital for left leg swelling with worsening pain But was found to be hyponatremic.  Thought to be secondary to hydrochlorothiazide and decreased appetite.  She was hydrated and HCTZ was discontinued. And then she also  Underwent Patellar Repair  She is now in a brace.  Is able to bend her knee 90 degrees.  Wears a soft brace at night.  Takes Tylenol as needed.   Robaxin at night and wanted a refill on  that. Needing assist with her ADLs.  Husband provides help with the shower.  Is able to do some of her transfers.  Walking with therapy and walker.  She is also currently complaining of urinary frequency.  States she sometimes has to go every 2-3 hours.  Does not seem to be a new issue.  Is using pull-ups as needed. Does have some short-term memory loss.  Husband helps her  Past Medical History:  Diagnosis Date   Dementia ([place])    Gait abnormality 06/20/2020   Hypertension    Hyponatremia    Iron deficiency anemia    Major depression    OA (osteoarthritis)    Pre-diabetes    Vitamin B 12 deficiency     Past Surgical History:  Procedure Laterality Date   ABDOMINAL HYSTERECTOMY     COLONOSCOPY     HEMANGIOMA EXCISION     JOINT REPLACEMENT     PATELLAR TENDON REPAIR Left 01/16/2021   Procedure: Left patellar tendon reconstruction;  Surgeon: Gaynelle Arabian, MD;  Location: [facility];  Service: Orthopedics;  Laterality: Left;   TONSILLECTOMY     TOTAL HIP ARTHROPLASTY Right 02/18/2019   Procedure: TOTAL HIP ARTHROPLASTY ANTERIOR APPROACH;  Surgeon: Gaynelle Arabian, MD;  Location: [facility];  Service: Orthopedics;  Laterality: Right;  121mn   TOTAL KNEE ARTHROPLASTY Right 05/27/2012   Procedure: TOTAL KNEE ARTHROPLASTY;  Surgeon: GMeredith Pel MD;  Location: [facility]  Service: Orthopedics;  Laterality: Right;  Right Total Knee Arthroplasty  TOTAL KNEE ARTHROPLASTY Left 12/12/2020   Procedure: TOTAL KNEE ARTHROPLASTY;  Surgeon: Gaynelle Arabian, MD;  Location: [facility];  Service: Orthopedics;  Laterality: Left;   TOTAL KNEE REVISION Right 08/06/2018   Procedure: TOTAL KNEE REVISION;  Surgeon: Gaynelle Arabian, MD;  Location: [facility];  Service: Orthopedics;  Laterality: Right;   TUBAL LIGATION      No Known Allergies  Outpatient Encounter Medications as of 05/03/2021  Medication Sig   acetaminophen (TYLENOL) 325 MG tablet Take 650 mg by mouth 2 (two) times daily.   acetaminophen (TYLENOL) 500  MG tablet Take 500 mg by mouth every 6 (six) hours as needed for moderate pain.   amLODipine (NORVASC) 5 MG tablet Take 1 tablet (5 mg total) by mouth in the morning.   aspirin EC 81 MG tablet Take 81 mg by mouth daily.   cholecalciferol (VITAMIN D3) 25 MCG (1000 UT) tablet Take 1,000 Units by mouth daily.   COMBIGAN 0.2-0.5 % ophthalmic solution Place 1 drop into the right eye in the morning and at bedtime.   Cyanocobalamin (B-12) 2500 MCG TABS Take 1 tablet by mouth daily.   dorzolamide (TRUSOPT) 2 % ophthalmic solution Place 1 drop into the right eye 2 (two) times daily.    ferrous sulfate 325 (65 FE) MG tablet Take 325 mg by mouth daily with breakfast.   lactose free nutrition (BOOST) LIQD Take 237 mLs by mouth daily.   latanoprost (XALATAN) 0.005 % ophthalmic solution Place 1 drop into both eyes at bedtime.    methocarbamol (ROBAXIN) 500 MG tablet Take 0.5 tablets (250 mg total) by mouth at bedtime as needed for muscle spasms.   Multiple Vitamins-Minerals (PRESERVISION AREDS 2 PO) Take 1 capsule by mouth in the morning and at bedtime.   olmesartan (BENICAR) 40 MG tablet Take 1 tablet (40 mg total) by mouth daily.   polyethylene glycol (MIRALAX / GLYCOLAX) 17 g packet Take 17 g by mouth daily. In the morning between 8-11 am   [DISCONTINUED] methocarbamol (ROBAXIN) 500 MG tablet Take 250 mg by mouth every 6 (six) hours as needed for muscle spasms.   No facility-administered encounter medications on file as of 05/03/2021.    Review of Systems:  Review of Systems  Constitutional:  Positive for activity change. Negative for appetite change.  HENT: Negative.    Respiratory:  Negative for cough and shortness of breath.   Cardiovascular:  Negative for leg swelling.  Gastrointestinal:  Negative for constipation.  Genitourinary:  Positive for frequency and urgency.  Musculoskeletal:  Positive for gait problem. Negative for arthralgias and myalgias.  Skin: Negative.   Neurological:  Negative for  dizziness and weakness.  Psychiatric/Behavioral:  Positive for confusion. Negative for dysphoric mood and sleep disturbance.    Health Maintenance  Topic Date Due   TETANUS/TDAP  Never done   DEXA SCAN  Never done   COVID-19 Vaccine (5 - Booster) 02/03/2021   Pneumonia Vaccine 60+ Years old  Completed   INFLUENZA VACCINE  Completed   Zoster Vaccines- Shingrix  Completed   HPV VACCINES  Aged Out    Physical Exam: Vitals:   05/03/21 1133  BP: 122/72  Pulse: 78  Temp: 98.1 F (36.7 C)  SpO2: 99%  Height: '5\' 4"'$  (1.626 m)   Body mass index is 21.9 kg/m. Physical Exam Vitals reviewed.  Constitutional:      Appearance: Normal appearance.  HENT:     Head: Normocephalic.     Nose: Nose normal.  Mouth/Throat:     Mouth: Mucous membranes are moist.     Pharynx: Oropharynx is clear.  Eyes:     Pupils: Pupils are equal, round, and reactive to light.  Cardiovascular:     Rate and Rhythm: Normal rate and regular rhythm.     Pulses: Normal pulses.     Heart sounds: Normal heart sounds. No murmur heard. Pulmonary:     Effort: Pulmonary effort is normal.     Breath sounds: Normal breath sounds.  Abdominal:     General: Abdomen is flat. Bowel sounds are normal.     Palpations: Abdomen is soft.  Musculoskeletal:        General: No swelling.     Cervical back: Neck supple.     Comments: Left Leg in Brace  Skin:    General: Skin is warm.  Neurological:     General: No focal deficit present.     Mental Status: She is alert.  Psychiatric:        Mood and Affect: Mood normal.        Thought Content: Thought content normal.    Labs reviewed: Basic Metabolic Panel: Recent Labs    12/21/20 1721 12/21/20 2050 12/22/20 1218 01/17/21 0310 01/18/21 0320 01/26/21 0000 03/27/21 1454  NA 125*  --    < > 136 132* 138 139  K 4.6  --    < > 3.5 3.3* 4.5 4.4  CL 94*  --    < > 103 100 104 104  CO2 24  --    < > 28 27 24* 29  GLUCOSE 119*  --    < > 90 103*  --  94  BUN 13   --    < > '9 10 11 15  '$ CREATININE 0.85  --    < > 0.50 0.51 0.5 0.81  CALCIUM 8.5*  --    < > 8.2* 8.2* 8.7 9.1  MG 1.8  --   --   --   --   --   --   PHOS 2.2*  --   --   --   --   --   --   TSH  --  1.488  --   --   --   --   --    < > = values in this interval not displayed.   Liver Function Tests: Recent Labs    12/02/20 1443 12/21/20 1721 12/29/20 0000 03/27/21 1454  AST 14* '21 16 17  '$ ALT '12 27 15 19  '$ ALKPHOS 66 122 130* 60  BILITOT 0.3 0.5  --  0.3  PROT 7.1 6.9  --  6.9  ALBUMIN 3.4* 3.4* 3.7 4.0   No results for input(s): LIPASE, AMYLASE in the last 8760 hours. No results for input(s): AMMONIA in the last 8760 hours. CBC: Recent Labs    11/23/20 1434 12/02/20 1443 12/21/20 1721 12/29/20 0000 01/17/21 0310 01/18/21 0320 01/26/21 0000 03/27/21 1454  WBC 6.3   < > 11.6*   < > 6.5 6.5 5.1 4.2  NEUTROABS 3.9  --  9.2*  --   --   --   --  2.2  HGB 9.8*   < > 10.4*   < > 9.4* 8.3* 9.4* 11.3*  HCT 29.6*   < > 31.9*   < > 28.4* 25.6* 30* 34.4*  MCV 92.5   < > 94.4  --  97.3 97.0  --  95.8  PLT 347   < >  349   < > 228 214 327 229   < > = values in this interval not displayed.   Lipid Panel: No results for input(s): CHOL, HDL, LDLCALC, TRIG, CHOLHDL, LDLDIRECT in the last 8760 hours. No results found for: HGBA1C  Procedures since last visit: No results found.  Assessment/Plan 1. S/P Repair Patellar tendon rupture, left, sequela Doing really well Robaxin renewed Follows with Dr Maureen Ralphs Continue Low dose of Aspirin Consider stopping when more active  2. Status post total left knee replacement Continue working with therapy Still needing help with her ADLS  3. Other iron deficiency anemia Per Dr Irene Limbo note She had Occult tested by her Previous PCP was negative Hgb stable on Iron and B 12 I discussed GI work-up with her and her husband today as suggested by Dr. Grier Mitts note.  They do not seem to be interested right now.  We will readdress in next  visit Hematology said to follow labs for now.  They are going to see her as needed.  4. Essential hypertension On Benicar and Norvasc  5. B12 deficiency Continue to supplement  6. Short-term memory loss Will need follow-up. MMSe in Rehab was 49/30 Husband is managing the meds  7. Hyponatremia Sodium checked in 01/22 was normal We will repeat before her next visit  8. Urinary frequency Same combination of stress and urge She is going to do some Kegel exercises with her OT Is not interested in any medicines right now    Labs/tests ordered:  * No order type specified * Next appt:  07/31/2021

## 2021-05-08 DIAGNOSIS — M62562 Muscle wasting and atrophy, not elsewhere classified, left lower leg: Secondary | ICD-10-CM | POA: Diagnosis not present

## 2021-05-08 DIAGNOSIS — R278 Other lack of coordination: Secondary | ICD-10-CM | POA: Diagnosis not present

## 2021-05-08 DIAGNOSIS — Z4789 Encounter for other orthopedic aftercare: Secondary | ICD-10-CM | POA: Diagnosis not present

## 2021-05-08 DIAGNOSIS — M1712 Unilateral primary osteoarthritis, left knee: Secondary | ICD-10-CM | POA: Diagnosis not present

## 2021-05-08 DIAGNOSIS — Z471 Aftercare following joint replacement surgery: Secondary | ICD-10-CM | POA: Diagnosis not present

## 2021-05-08 DIAGNOSIS — R2689 Other abnormalities of gait and mobility: Secondary | ICD-10-CM | POA: Diagnosis not present

## 2021-05-09 DIAGNOSIS — Z471 Aftercare following joint replacement surgery: Secondary | ICD-10-CM | POA: Diagnosis not present

## 2021-05-09 DIAGNOSIS — Z96652 Presence of left artificial knee joint: Secondary | ICD-10-CM | POA: Diagnosis not present

## 2021-05-09 DIAGNOSIS — R41840 Attention and concentration deficit: Secondary | ICD-10-CM | POA: Diagnosis not present

## 2021-05-09 DIAGNOSIS — M66862 Spontaneous rupture of other tendons, left lower leg: Secondary | ICD-10-CM | POA: Diagnosis not present

## 2021-05-09 DIAGNOSIS — M1712 Unilateral primary osteoarthritis, left knee: Secondary | ICD-10-CM | POA: Diagnosis not present

## 2021-05-09 DIAGNOSIS — R278 Other lack of coordination: Secondary | ICD-10-CM | POA: Diagnosis not present

## 2021-05-09 DIAGNOSIS — M62562 Muscle wasting and atrophy, not elsewhere classified, left lower leg: Secondary | ICD-10-CM | POA: Diagnosis not present

## 2021-05-10 DIAGNOSIS — M1712 Unilateral primary osteoarthritis, left knee: Secondary | ICD-10-CM | POA: Diagnosis not present

## 2021-05-10 DIAGNOSIS — R278 Other lack of coordination: Secondary | ICD-10-CM | POA: Diagnosis not present

## 2021-05-10 DIAGNOSIS — Z4789 Encounter for other orthopedic aftercare: Secondary | ICD-10-CM | POA: Diagnosis not present

## 2021-05-10 DIAGNOSIS — M62562 Muscle wasting and atrophy, not elsewhere classified, left lower leg: Secondary | ICD-10-CM | POA: Diagnosis not present

## 2021-05-10 DIAGNOSIS — Z471 Aftercare following joint replacement surgery: Secondary | ICD-10-CM | POA: Diagnosis not present

## 2021-05-10 DIAGNOSIS — R2689 Other abnormalities of gait and mobility: Secondary | ICD-10-CM | POA: Diagnosis not present

## 2021-05-22 DIAGNOSIS — R2689 Other abnormalities of gait and mobility: Secondary | ICD-10-CM | POA: Diagnosis not present

## 2021-05-22 DIAGNOSIS — Z4789 Encounter for other orthopedic aftercare: Secondary | ICD-10-CM | POA: Diagnosis not present

## 2021-05-22 DIAGNOSIS — M1712 Unilateral primary osteoarthritis, left knee: Secondary | ICD-10-CM | POA: Diagnosis not present

## 2021-05-22 DIAGNOSIS — M62562 Muscle wasting and atrophy, not elsewhere classified, left lower leg: Secondary | ICD-10-CM | POA: Diagnosis not present

## 2021-05-22 DIAGNOSIS — R278 Other lack of coordination: Secondary | ICD-10-CM | POA: Diagnosis not present

## 2021-05-22 DIAGNOSIS — Z471 Aftercare following joint replacement surgery: Secondary | ICD-10-CM | POA: Diagnosis not present

## 2021-05-23 DIAGNOSIS — M66862 Spontaneous rupture of other tendons, left lower leg: Secondary | ICD-10-CM | POA: Diagnosis not present

## 2021-05-23 DIAGNOSIS — R41840 Attention and concentration deficit: Secondary | ICD-10-CM | POA: Diagnosis not present

## 2021-05-23 DIAGNOSIS — Z96652 Presence of left artificial knee joint: Secondary | ICD-10-CM | POA: Diagnosis not present

## 2021-05-23 DIAGNOSIS — M1712 Unilateral primary osteoarthritis, left knee: Secondary | ICD-10-CM | POA: Diagnosis not present

## 2021-05-23 DIAGNOSIS — Z471 Aftercare following joint replacement surgery: Secondary | ICD-10-CM | POA: Diagnosis not present

## 2021-05-23 DIAGNOSIS — M62562 Muscle wasting and atrophy, not elsewhere classified, left lower leg: Secondary | ICD-10-CM | POA: Diagnosis not present

## 2021-05-23 DIAGNOSIS — R278 Other lack of coordination: Secondary | ICD-10-CM | POA: Diagnosis not present

## 2021-05-25 DIAGNOSIS — Z471 Aftercare following joint replacement surgery: Secondary | ICD-10-CM | POA: Diagnosis not present

## 2021-05-25 DIAGNOSIS — M1712 Unilateral primary osteoarthritis, left knee: Secondary | ICD-10-CM | POA: Diagnosis not present

## 2021-05-25 DIAGNOSIS — Z96652 Presence of left artificial knee joint: Secondary | ICD-10-CM | POA: Diagnosis not present

## 2021-05-25 DIAGNOSIS — R41840 Attention and concentration deficit: Secondary | ICD-10-CM | POA: Diagnosis not present

## 2021-05-25 DIAGNOSIS — M62562 Muscle wasting and atrophy, not elsewhere classified, left lower leg: Secondary | ICD-10-CM | POA: Diagnosis not present

## 2021-05-25 DIAGNOSIS — R278 Other lack of coordination: Secondary | ICD-10-CM | POA: Diagnosis not present

## 2021-05-25 DIAGNOSIS — M66862 Spontaneous rupture of other tendons, left lower leg: Secondary | ICD-10-CM | POA: Diagnosis not present

## 2021-05-29 DIAGNOSIS — M62562 Muscle wasting and atrophy, not elsewhere classified, left lower leg: Secondary | ICD-10-CM | POA: Diagnosis not present

## 2021-05-29 DIAGNOSIS — R2689 Other abnormalities of gait and mobility: Secondary | ICD-10-CM | POA: Diagnosis not present

## 2021-05-29 DIAGNOSIS — M1712 Unilateral primary osteoarthritis, left knee: Secondary | ICD-10-CM | POA: Diagnosis not present

## 2021-05-29 DIAGNOSIS — Z4789 Encounter for other orthopedic aftercare: Secondary | ICD-10-CM | POA: Diagnosis not present

## 2021-05-29 DIAGNOSIS — Z471 Aftercare following joint replacement surgery: Secondary | ICD-10-CM | POA: Diagnosis not present

## 2021-05-29 DIAGNOSIS — R278 Other lack of coordination: Secondary | ICD-10-CM | POA: Diagnosis not present

## 2021-05-30 DIAGNOSIS — R41840 Attention and concentration deficit: Secondary | ICD-10-CM | POA: Diagnosis not present

## 2021-05-30 DIAGNOSIS — M66862 Spontaneous rupture of other tendons, left lower leg: Secondary | ICD-10-CM | POA: Diagnosis not present

## 2021-05-30 DIAGNOSIS — M62562 Muscle wasting and atrophy, not elsewhere classified, left lower leg: Secondary | ICD-10-CM | POA: Diagnosis not present

## 2021-05-30 DIAGNOSIS — Z471 Aftercare following joint replacement surgery: Secondary | ICD-10-CM | POA: Diagnosis not present

## 2021-05-30 DIAGNOSIS — M1712 Unilateral primary osteoarthritis, left knee: Secondary | ICD-10-CM | POA: Diagnosis not present

## 2021-05-30 DIAGNOSIS — Z96652 Presence of left artificial knee joint: Secondary | ICD-10-CM | POA: Diagnosis not present

## 2021-05-30 DIAGNOSIS — R278 Other lack of coordination: Secondary | ICD-10-CM | POA: Diagnosis not present

## 2021-05-31 ENCOUNTER — Encounter (INDEPENDENT_AMBULATORY_CARE_PROVIDER_SITE_OTHER): Payer: Medicare Other | Admitting: Ophthalmology

## 2021-05-31 DIAGNOSIS — H353211 Exudative age-related macular degeneration, right eye, with active choroidal neovascularization: Secondary | ICD-10-CM

## 2021-05-31 DIAGNOSIS — H35033 Hypertensive retinopathy, bilateral: Secondary | ICD-10-CM | POA: Diagnosis not present

## 2021-05-31 DIAGNOSIS — H43813 Vitreous degeneration, bilateral: Secondary | ICD-10-CM

## 2021-05-31 DIAGNOSIS — Z4789 Encounter for other orthopedic aftercare: Secondary | ICD-10-CM | POA: Diagnosis not present

## 2021-05-31 DIAGNOSIS — I10 Essential (primary) hypertension: Secondary | ICD-10-CM | POA: Diagnosis not present

## 2021-05-31 DIAGNOSIS — Z471 Aftercare following joint replacement surgery: Secondary | ICD-10-CM | POA: Diagnosis not present

## 2021-05-31 DIAGNOSIS — H353122 Nonexudative age-related macular degeneration, left eye, intermediate dry stage: Secondary | ICD-10-CM | POA: Diagnosis not present

## 2021-05-31 DIAGNOSIS — R278 Other lack of coordination: Secondary | ICD-10-CM | POA: Diagnosis not present

## 2021-05-31 DIAGNOSIS — M62562 Muscle wasting and atrophy, not elsewhere classified, left lower leg: Secondary | ICD-10-CM | POA: Diagnosis not present

## 2021-05-31 DIAGNOSIS — R2689 Other abnormalities of gait and mobility: Secondary | ICD-10-CM | POA: Diagnosis not present

## 2021-05-31 DIAGNOSIS — M1712 Unilateral primary osteoarthritis, left knee: Secondary | ICD-10-CM | POA: Diagnosis not present

## 2021-06-01 DIAGNOSIS — Z471 Aftercare following joint replacement surgery: Secondary | ICD-10-CM | POA: Diagnosis not present

## 2021-06-01 DIAGNOSIS — M1712 Unilateral primary osteoarthritis, left knee: Secondary | ICD-10-CM | POA: Diagnosis not present

## 2021-06-01 DIAGNOSIS — Z96652 Presence of left artificial knee joint: Secondary | ICD-10-CM | POA: Diagnosis not present

## 2021-06-01 DIAGNOSIS — M66862 Spontaneous rupture of other tendons, left lower leg: Secondary | ICD-10-CM | POA: Diagnosis not present

## 2021-06-01 DIAGNOSIS — R278 Other lack of coordination: Secondary | ICD-10-CM | POA: Diagnosis not present

## 2021-06-01 DIAGNOSIS — M62562 Muscle wasting and atrophy, not elsewhere classified, left lower leg: Secondary | ICD-10-CM | POA: Diagnosis not present

## 2021-06-01 DIAGNOSIS — R41840 Attention and concentration deficit: Secondary | ICD-10-CM | POA: Diagnosis not present

## 2021-06-05 DIAGNOSIS — R2689 Other abnormalities of gait and mobility: Secondary | ICD-10-CM | POA: Diagnosis not present

## 2021-06-05 DIAGNOSIS — Z4789 Encounter for other orthopedic aftercare: Secondary | ICD-10-CM | POA: Diagnosis not present

## 2021-06-05 DIAGNOSIS — M62562 Muscle wasting and atrophy, not elsewhere classified, left lower leg: Secondary | ICD-10-CM | POA: Diagnosis not present

## 2021-06-05 DIAGNOSIS — M1712 Unilateral primary osteoarthritis, left knee: Secondary | ICD-10-CM | POA: Diagnosis not present

## 2021-06-05 DIAGNOSIS — Z471 Aftercare following joint replacement surgery: Secondary | ICD-10-CM | POA: Diagnosis not present

## 2021-06-05 DIAGNOSIS — R278 Other lack of coordination: Secondary | ICD-10-CM | POA: Diagnosis not present

## 2021-06-06 DIAGNOSIS — M66862 Spontaneous rupture of other tendons, left lower leg: Secondary | ICD-10-CM | POA: Diagnosis not present

## 2021-06-06 DIAGNOSIS — Z471 Aftercare following joint replacement surgery: Secondary | ICD-10-CM | POA: Diagnosis not present

## 2021-06-06 DIAGNOSIS — R278 Other lack of coordination: Secondary | ICD-10-CM | POA: Diagnosis not present

## 2021-06-06 DIAGNOSIS — M62562 Muscle wasting and atrophy, not elsewhere classified, left lower leg: Secondary | ICD-10-CM | POA: Diagnosis not present

## 2021-06-06 DIAGNOSIS — R41840 Attention and concentration deficit: Secondary | ICD-10-CM | POA: Diagnosis not present

## 2021-06-06 DIAGNOSIS — M1712 Unilateral primary osteoarthritis, left knee: Secondary | ICD-10-CM | POA: Diagnosis not present

## 2021-06-06 DIAGNOSIS — Z96652 Presence of left artificial knee joint: Secondary | ICD-10-CM | POA: Diagnosis not present

## 2021-06-07 DIAGNOSIS — R2689 Other abnormalities of gait and mobility: Secondary | ICD-10-CM | POA: Diagnosis not present

## 2021-06-07 DIAGNOSIS — M62562 Muscle wasting and atrophy, not elsewhere classified, left lower leg: Secondary | ICD-10-CM | POA: Diagnosis not present

## 2021-06-07 DIAGNOSIS — Z471 Aftercare following joint replacement surgery: Secondary | ICD-10-CM | POA: Diagnosis not present

## 2021-06-07 DIAGNOSIS — Z4789 Encounter for other orthopedic aftercare: Secondary | ICD-10-CM | POA: Diagnosis not present

## 2021-06-07 DIAGNOSIS — R278 Other lack of coordination: Secondary | ICD-10-CM | POA: Diagnosis not present

## 2021-06-07 DIAGNOSIS — M1712 Unilateral primary osteoarthritis, left knee: Secondary | ICD-10-CM | POA: Diagnosis not present

## 2021-06-08 DIAGNOSIS — M66862 Spontaneous rupture of other tendons, left lower leg: Secondary | ICD-10-CM | POA: Diagnosis not present

## 2021-06-08 DIAGNOSIS — M62562 Muscle wasting and atrophy, not elsewhere classified, left lower leg: Secondary | ICD-10-CM | POA: Diagnosis not present

## 2021-06-08 DIAGNOSIS — Z471 Aftercare following joint replacement surgery: Secondary | ICD-10-CM | POA: Diagnosis not present

## 2021-06-08 DIAGNOSIS — R278 Other lack of coordination: Secondary | ICD-10-CM | POA: Diagnosis not present

## 2021-06-08 DIAGNOSIS — M1712 Unilateral primary osteoarthritis, left knee: Secondary | ICD-10-CM | POA: Diagnosis not present

## 2021-06-08 DIAGNOSIS — R41840 Attention and concentration deficit: Secondary | ICD-10-CM | POA: Diagnosis not present

## 2021-06-08 DIAGNOSIS — Z96652 Presence of left artificial knee joint: Secondary | ICD-10-CM | POA: Diagnosis not present

## 2021-06-12 DIAGNOSIS — R2689 Other abnormalities of gait and mobility: Secondary | ICD-10-CM | POA: Diagnosis not present

## 2021-06-12 DIAGNOSIS — Z471 Aftercare following joint replacement surgery: Secondary | ICD-10-CM | POA: Diagnosis not present

## 2021-06-12 DIAGNOSIS — M1712 Unilateral primary osteoarthritis, left knee: Secondary | ICD-10-CM | POA: Diagnosis not present

## 2021-06-12 DIAGNOSIS — M62562 Muscle wasting and atrophy, not elsewhere classified, left lower leg: Secondary | ICD-10-CM | POA: Diagnosis not present

## 2021-06-12 DIAGNOSIS — Z4789 Encounter for other orthopedic aftercare: Secondary | ICD-10-CM | POA: Diagnosis not present

## 2021-06-12 DIAGNOSIS — R278 Other lack of coordination: Secondary | ICD-10-CM | POA: Diagnosis not present

## 2021-06-13 DIAGNOSIS — M1712 Unilateral primary osteoarthritis, left knee: Secondary | ICD-10-CM | POA: Diagnosis not present

## 2021-06-13 DIAGNOSIS — Z96652 Presence of left artificial knee joint: Secondary | ICD-10-CM | POA: Diagnosis not present

## 2021-06-13 DIAGNOSIS — R278 Other lack of coordination: Secondary | ICD-10-CM | POA: Diagnosis not present

## 2021-06-13 DIAGNOSIS — M66862 Spontaneous rupture of other tendons, left lower leg: Secondary | ICD-10-CM | POA: Diagnosis not present

## 2021-06-13 DIAGNOSIS — M62562 Muscle wasting and atrophy, not elsewhere classified, left lower leg: Secondary | ICD-10-CM | POA: Diagnosis not present

## 2021-06-13 DIAGNOSIS — R41840 Attention and concentration deficit: Secondary | ICD-10-CM | POA: Diagnosis not present

## 2021-06-13 DIAGNOSIS — Z471 Aftercare following joint replacement surgery: Secondary | ICD-10-CM | POA: Diagnosis not present

## 2021-06-14 DIAGNOSIS — M62562 Muscle wasting and atrophy, not elsewhere classified, left lower leg: Secondary | ICD-10-CM | POA: Diagnosis not present

## 2021-06-14 DIAGNOSIS — Z471 Aftercare following joint replacement surgery: Secondary | ICD-10-CM | POA: Diagnosis not present

## 2021-06-14 DIAGNOSIS — R278 Other lack of coordination: Secondary | ICD-10-CM | POA: Diagnosis not present

## 2021-06-14 DIAGNOSIS — R2689 Other abnormalities of gait and mobility: Secondary | ICD-10-CM | POA: Diagnosis not present

## 2021-06-14 DIAGNOSIS — M1712 Unilateral primary osteoarthritis, left knee: Secondary | ICD-10-CM | POA: Diagnosis not present

## 2021-06-14 DIAGNOSIS — Z4789 Encounter for other orthopedic aftercare: Secondary | ICD-10-CM | POA: Diagnosis not present

## 2021-06-15 DIAGNOSIS — Z96652 Presence of left artificial knee joint: Secondary | ICD-10-CM | POA: Diagnosis not present

## 2021-06-15 DIAGNOSIS — R278 Other lack of coordination: Secondary | ICD-10-CM | POA: Diagnosis not present

## 2021-06-15 DIAGNOSIS — R41840 Attention and concentration deficit: Secondary | ICD-10-CM | POA: Diagnosis not present

## 2021-06-15 DIAGNOSIS — Z471 Aftercare following joint replacement surgery: Secondary | ICD-10-CM | POA: Diagnosis not present

## 2021-06-15 DIAGNOSIS — M62562 Muscle wasting and atrophy, not elsewhere classified, left lower leg: Secondary | ICD-10-CM | POA: Diagnosis not present

## 2021-06-15 DIAGNOSIS — M1712 Unilateral primary osteoarthritis, left knee: Secondary | ICD-10-CM | POA: Diagnosis not present

## 2021-06-15 DIAGNOSIS — M66862 Spontaneous rupture of other tendons, left lower leg: Secondary | ICD-10-CM | POA: Diagnosis not present

## 2021-06-22 DIAGNOSIS — R41840 Attention and concentration deficit: Secondary | ICD-10-CM | POA: Diagnosis not present

## 2021-06-22 DIAGNOSIS — M1712 Unilateral primary osteoarthritis, left knee: Secondary | ICD-10-CM | POA: Diagnosis not present

## 2021-06-22 DIAGNOSIS — R278 Other lack of coordination: Secondary | ICD-10-CM | POA: Diagnosis not present

## 2021-06-22 DIAGNOSIS — Z96652 Presence of left artificial knee joint: Secondary | ICD-10-CM | POA: Diagnosis not present

## 2021-06-22 DIAGNOSIS — M66862 Spontaneous rupture of other tendons, left lower leg: Secondary | ICD-10-CM | POA: Diagnosis not present

## 2021-06-22 DIAGNOSIS — Z471 Aftercare following joint replacement surgery: Secondary | ICD-10-CM | POA: Diagnosis not present

## 2021-06-22 DIAGNOSIS — M62562 Muscle wasting and atrophy, not elsewhere classified, left lower leg: Secondary | ICD-10-CM | POA: Diagnosis not present

## 2021-06-27 DIAGNOSIS — Z96652 Presence of left artificial knee joint: Secondary | ICD-10-CM | POA: Diagnosis not present

## 2021-06-27 DIAGNOSIS — M66862 Spontaneous rupture of other tendons, left lower leg: Secondary | ICD-10-CM | POA: Diagnosis not present

## 2021-06-27 DIAGNOSIS — R41840 Attention and concentration deficit: Secondary | ICD-10-CM | POA: Diagnosis not present

## 2021-06-27 DIAGNOSIS — M1712 Unilateral primary osteoarthritis, left knee: Secondary | ICD-10-CM | POA: Diagnosis not present

## 2021-06-27 DIAGNOSIS — R278 Other lack of coordination: Secondary | ICD-10-CM | POA: Diagnosis not present

## 2021-06-27 DIAGNOSIS — Z471 Aftercare following joint replacement surgery: Secondary | ICD-10-CM | POA: Diagnosis not present

## 2021-06-27 DIAGNOSIS — M62562 Muscle wasting and atrophy, not elsewhere classified, left lower leg: Secondary | ICD-10-CM | POA: Diagnosis not present

## 2021-06-29 DIAGNOSIS — Z96652 Presence of left artificial knee joint: Secondary | ICD-10-CM | POA: Diagnosis not present

## 2021-06-29 DIAGNOSIS — Z471 Aftercare following joint replacement surgery: Secondary | ICD-10-CM | POA: Diagnosis not present

## 2021-06-29 DIAGNOSIS — R41840 Attention and concentration deficit: Secondary | ICD-10-CM | POA: Diagnosis not present

## 2021-06-29 DIAGNOSIS — M1712 Unilateral primary osteoarthritis, left knee: Secondary | ICD-10-CM | POA: Diagnosis not present

## 2021-06-29 DIAGNOSIS — R278 Other lack of coordination: Secondary | ICD-10-CM | POA: Diagnosis not present

## 2021-06-29 DIAGNOSIS — M66862 Spontaneous rupture of other tendons, left lower leg: Secondary | ICD-10-CM | POA: Diagnosis not present

## 2021-06-29 DIAGNOSIS — M62562 Muscle wasting and atrophy, not elsewhere classified, left lower leg: Secondary | ICD-10-CM | POA: Diagnosis not present

## 2021-07-04 DIAGNOSIS — R278 Other lack of coordination: Secondary | ICD-10-CM | POA: Diagnosis not present

## 2021-07-04 DIAGNOSIS — M1712 Unilateral primary osteoarthritis, left knee: Secondary | ICD-10-CM | POA: Diagnosis not present

## 2021-07-04 DIAGNOSIS — M62562 Muscle wasting and atrophy, not elsewhere classified, left lower leg: Secondary | ICD-10-CM | POA: Diagnosis not present

## 2021-07-04 DIAGNOSIS — Z471 Aftercare following joint replacement surgery: Secondary | ICD-10-CM | POA: Diagnosis not present

## 2021-07-04 DIAGNOSIS — R41840 Attention and concentration deficit: Secondary | ICD-10-CM | POA: Diagnosis not present

## 2021-07-04 DIAGNOSIS — M66862 Spontaneous rupture of other tendons, left lower leg: Secondary | ICD-10-CM | POA: Diagnosis not present

## 2021-07-04 DIAGNOSIS — Z96652 Presence of left artificial knee joint: Secondary | ICD-10-CM | POA: Diagnosis not present

## 2021-07-06 DIAGNOSIS — Z96652 Presence of left artificial knee joint: Secondary | ICD-10-CM | POA: Diagnosis not present

## 2021-07-06 DIAGNOSIS — M62562 Muscle wasting and atrophy, not elsewhere classified, left lower leg: Secondary | ICD-10-CM | POA: Diagnosis not present

## 2021-07-06 DIAGNOSIS — M1712 Unilateral primary osteoarthritis, left knee: Secondary | ICD-10-CM | POA: Diagnosis not present

## 2021-07-06 DIAGNOSIS — R41840 Attention and concentration deficit: Secondary | ICD-10-CM | POA: Diagnosis not present

## 2021-07-06 DIAGNOSIS — R278 Other lack of coordination: Secondary | ICD-10-CM | POA: Diagnosis not present

## 2021-07-06 DIAGNOSIS — M66862 Spontaneous rupture of other tendons, left lower leg: Secondary | ICD-10-CM | POA: Diagnosis not present

## 2021-07-06 DIAGNOSIS — Z471 Aftercare following joint replacement surgery: Secondary | ICD-10-CM | POA: Diagnosis not present

## 2021-07-10 DIAGNOSIS — Z471 Aftercare following joint replacement surgery: Secondary | ICD-10-CM | POA: Diagnosis not present

## 2021-07-10 DIAGNOSIS — M62562 Muscle wasting and atrophy, not elsewhere classified, left lower leg: Secondary | ICD-10-CM | POA: Diagnosis not present

## 2021-07-10 DIAGNOSIS — M1712 Unilateral primary osteoarthritis, left knee: Secondary | ICD-10-CM | POA: Diagnosis not present

## 2021-07-10 DIAGNOSIS — M66862 Spontaneous rupture of other tendons, left lower leg: Secondary | ICD-10-CM | POA: Diagnosis not present

## 2021-07-10 DIAGNOSIS — R278 Other lack of coordination: Secondary | ICD-10-CM | POA: Diagnosis not present

## 2021-07-10 DIAGNOSIS — R41840 Attention and concentration deficit: Secondary | ICD-10-CM | POA: Diagnosis not present

## 2021-07-10 DIAGNOSIS — Z96652 Presence of left artificial knee joint: Secondary | ICD-10-CM | POA: Diagnosis not present

## 2021-07-11 DIAGNOSIS — H6123 Impacted cerumen, bilateral: Secondary | ICD-10-CM | POA: Insufficient documentation

## 2021-07-12 DIAGNOSIS — M62562 Muscle wasting and atrophy, not elsewhere classified, left lower leg: Secondary | ICD-10-CM | POA: Diagnosis not present

## 2021-07-12 DIAGNOSIS — Z96652 Presence of left artificial knee joint: Secondary | ICD-10-CM | POA: Diagnosis not present

## 2021-07-12 DIAGNOSIS — R278 Other lack of coordination: Secondary | ICD-10-CM | POA: Diagnosis not present

## 2021-07-12 DIAGNOSIS — R41840 Attention and concentration deficit: Secondary | ICD-10-CM | POA: Diagnosis not present

## 2021-07-12 DIAGNOSIS — M1712 Unilateral primary osteoarthritis, left knee: Secondary | ICD-10-CM | POA: Diagnosis not present

## 2021-07-12 DIAGNOSIS — M66862 Spontaneous rupture of other tendons, left lower leg: Secondary | ICD-10-CM | POA: Diagnosis not present

## 2021-07-12 DIAGNOSIS — Z471 Aftercare following joint replacement surgery: Secondary | ICD-10-CM | POA: Diagnosis not present

## 2021-07-14 DIAGNOSIS — R41840 Attention and concentration deficit: Secondary | ICD-10-CM | POA: Diagnosis not present

## 2021-07-14 DIAGNOSIS — Z471 Aftercare following joint replacement surgery: Secondary | ICD-10-CM | POA: Diagnosis not present

## 2021-07-14 DIAGNOSIS — M66862 Spontaneous rupture of other tendons, left lower leg: Secondary | ICD-10-CM | POA: Diagnosis not present

## 2021-07-14 DIAGNOSIS — M1712 Unilateral primary osteoarthritis, left knee: Secondary | ICD-10-CM | POA: Diagnosis not present

## 2021-07-14 DIAGNOSIS — M62562 Muscle wasting and atrophy, not elsewhere classified, left lower leg: Secondary | ICD-10-CM | POA: Diagnosis not present

## 2021-07-14 DIAGNOSIS — Z96652 Presence of left artificial knee joint: Secondary | ICD-10-CM | POA: Diagnosis not present

## 2021-07-14 DIAGNOSIS — R278 Other lack of coordination: Secondary | ICD-10-CM | POA: Diagnosis not present

## 2021-07-17 DIAGNOSIS — R41840 Attention and concentration deficit: Secondary | ICD-10-CM | POA: Diagnosis not present

## 2021-07-17 DIAGNOSIS — M1712 Unilateral primary osteoarthritis, left knee: Secondary | ICD-10-CM | POA: Diagnosis not present

## 2021-07-17 DIAGNOSIS — Z471 Aftercare following joint replacement surgery: Secondary | ICD-10-CM | POA: Diagnosis not present

## 2021-07-17 DIAGNOSIS — R278 Other lack of coordination: Secondary | ICD-10-CM | POA: Diagnosis not present

## 2021-07-17 DIAGNOSIS — Z96652 Presence of left artificial knee joint: Secondary | ICD-10-CM | POA: Diagnosis not present

## 2021-07-17 DIAGNOSIS — M66862 Spontaneous rupture of other tendons, left lower leg: Secondary | ICD-10-CM | POA: Diagnosis not present

## 2021-07-17 DIAGNOSIS — M62562 Muscle wasting and atrophy, not elsewhere classified, left lower leg: Secondary | ICD-10-CM | POA: Diagnosis not present

## 2021-07-19 DIAGNOSIS — M62562 Muscle wasting and atrophy, not elsewhere classified, left lower leg: Secondary | ICD-10-CM | POA: Diagnosis not present

## 2021-07-19 DIAGNOSIS — R278 Other lack of coordination: Secondary | ICD-10-CM | POA: Diagnosis not present

## 2021-07-19 DIAGNOSIS — Z96652 Presence of left artificial knee joint: Secondary | ICD-10-CM | POA: Diagnosis not present

## 2021-07-19 DIAGNOSIS — R41840 Attention and concentration deficit: Secondary | ICD-10-CM | POA: Diagnosis not present

## 2021-07-19 DIAGNOSIS — Z471 Aftercare following joint replacement surgery: Secondary | ICD-10-CM | POA: Diagnosis not present

## 2021-07-19 DIAGNOSIS — M66862 Spontaneous rupture of other tendons, left lower leg: Secondary | ICD-10-CM | POA: Diagnosis not present

## 2021-07-19 DIAGNOSIS — M1712 Unilateral primary osteoarthritis, left knee: Secondary | ICD-10-CM | POA: Diagnosis not present

## 2021-07-21 DIAGNOSIS — Z96652 Presence of left artificial knee joint: Secondary | ICD-10-CM | POA: Diagnosis not present

## 2021-07-21 DIAGNOSIS — M1712 Unilateral primary osteoarthritis, left knee: Secondary | ICD-10-CM | POA: Diagnosis not present

## 2021-07-21 DIAGNOSIS — M62562 Muscle wasting and atrophy, not elsewhere classified, left lower leg: Secondary | ICD-10-CM | POA: Diagnosis not present

## 2021-07-21 DIAGNOSIS — R278 Other lack of coordination: Secondary | ICD-10-CM | POA: Diagnosis not present

## 2021-07-21 DIAGNOSIS — Z471 Aftercare following joint replacement surgery: Secondary | ICD-10-CM | POA: Diagnosis not present

## 2021-07-21 DIAGNOSIS — M66862 Spontaneous rupture of other tendons, left lower leg: Secondary | ICD-10-CM | POA: Diagnosis not present

## 2021-07-21 DIAGNOSIS — R41840 Attention and concentration deficit: Secondary | ICD-10-CM | POA: Diagnosis not present

## 2021-07-26 DIAGNOSIS — Z471 Aftercare following joint replacement surgery: Secondary | ICD-10-CM | POA: Diagnosis not present

## 2021-07-26 DIAGNOSIS — R278 Other lack of coordination: Secondary | ICD-10-CM | POA: Diagnosis not present

## 2021-07-26 DIAGNOSIS — M1712 Unilateral primary osteoarthritis, left knee: Secondary | ICD-10-CM | POA: Diagnosis not present

## 2021-07-26 DIAGNOSIS — M62562 Muscle wasting and atrophy, not elsewhere classified, left lower leg: Secondary | ICD-10-CM | POA: Diagnosis not present

## 2021-07-26 DIAGNOSIS — R41840 Attention and concentration deficit: Secondary | ICD-10-CM | POA: Diagnosis not present

## 2021-07-26 DIAGNOSIS — Z96652 Presence of left artificial knee joint: Secondary | ICD-10-CM | POA: Diagnosis not present

## 2021-07-26 DIAGNOSIS — M66862 Spontaneous rupture of other tendons, left lower leg: Secondary | ICD-10-CM | POA: Diagnosis not present

## 2021-07-27 DIAGNOSIS — D509 Iron deficiency anemia, unspecified: Secondary | ICD-10-CM | POA: Diagnosis not present

## 2021-07-27 DIAGNOSIS — E538 Deficiency of other specified B group vitamins: Secondary | ICD-10-CM | POA: Diagnosis not present

## 2021-07-27 DIAGNOSIS — I10 Essential (primary) hypertension: Secondary | ICD-10-CM | POA: Diagnosis not present

## 2021-07-27 LAB — HEPATIC FUNCTION PANEL
ALT: 9 U/L (ref 7–35)
AST: 13 (ref 13–35)
Alkaline Phosphatase: 61 (ref 25–125)
Bilirubin, Total: 0.4

## 2021-07-27 LAB — COMPREHENSIVE METABOLIC PANEL
Albumin: 4.2 (ref 3.5–5.0)
Calcium: 8.8 (ref 8.7–10.7)
Globulin: 2.1

## 2021-07-27 LAB — CBC AND DIFFERENTIAL
HCT: 36 (ref 36–46)
Hemoglobin: 12.1 (ref 12.0–16.0)
Platelets: 241 10*3/uL (ref 150–400)
WBC: 3.7

## 2021-07-27 LAB — BASIC METABOLIC PANEL
BUN: 19 (ref 4–21)
CO2: 25 — AB (ref 13–22)
Chloride: 106 (ref 99–108)
Creatinine: 0.6 (ref 0.5–1.1)
Glucose: 102
Potassium: 3.9 mEq/L (ref 3.5–5.1)
Sodium: 142 (ref 137–147)

## 2021-07-27 LAB — LIPID PANEL
Cholesterol: 122 (ref 0–200)
HDL: 71 — AB (ref 35–70)
LDL Cholesterol: 40
LDl/HDL Ratio: 1.7
Triglycerides: 54 (ref 40–160)

## 2021-07-27 LAB — VITAMIN B12: Vitamin B-12: 2000

## 2021-07-27 LAB — TSH: TSH: 1.54 (ref 0.41–5.90)

## 2021-07-27 LAB — CBC: RBC: 3.76 — AB (ref 3.87–5.11)

## 2021-07-28 ENCOUNTER — Encounter: Payer: Self-pay | Admitting: Adult Health

## 2021-07-28 DIAGNOSIS — M62562 Muscle wasting and atrophy, not elsewhere classified, left lower leg: Secondary | ICD-10-CM | POA: Diagnosis not present

## 2021-07-28 DIAGNOSIS — Z96652 Presence of left artificial knee joint: Secondary | ICD-10-CM | POA: Diagnosis not present

## 2021-07-28 DIAGNOSIS — R278 Other lack of coordination: Secondary | ICD-10-CM | POA: Diagnosis not present

## 2021-07-28 DIAGNOSIS — Z471 Aftercare following joint replacement surgery: Secondary | ICD-10-CM | POA: Diagnosis not present

## 2021-07-28 DIAGNOSIS — M1712 Unilateral primary osteoarthritis, left knee: Secondary | ICD-10-CM | POA: Diagnosis not present

## 2021-07-28 DIAGNOSIS — M66862 Spontaneous rupture of other tendons, left lower leg: Secondary | ICD-10-CM | POA: Diagnosis not present

## 2021-07-28 DIAGNOSIS — R41840 Attention and concentration deficit: Secondary | ICD-10-CM | POA: Diagnosis not present

## 2021-07-31 ENCOUNTER — Non-Acute Institutional Stay: Payer: Medicare Other | Admitting: Adult Health

## 2021-07-31 ENCOUNTER — Encounter: Payer: Self-pay | Admitting: Adult Health

## 2021-07-31 VITALS — BP 118/70 | HR 86 | Temp 98.1°F | Ht 64.0 in | Wt 136.8 lb

## 2021-07-31 DIAGNOSIS — S86812D Strain of other muscle(s) and tendon(s) at lower leg level, left leg, subsequent encounter: Secondary | ICD-10-CM | POA: Diagnosis not present

## 2021-07-31 DIAGNOSIS — M1712 Unilateral primary osteoarthritis, left knee: Secondary | ICD-10-CM | POA: Diagnosis not present

## 2021-07-31 DIAGNOSIS — R413 Other amnesia: Secondary | ICD-10-CM

## 2021-07-31 DIAGNOSIS — R278 Other lack of coordination: Secondary | ICD-10-CM | POA: Diagnosis not present

## 2021-07-31 DIAGNOSIS — D509 Iron deficiency anemia, unspecified: Secondary | ICD-10-CM

## 2021-07-31 DIAGNOSIS — M62562 Muscle wasting and atrophy, not elsewhere classified, left lower leg: Secondary | ICD-10-CM | POA: Diagnosis not present

## 2021-07-31 DIAGNOSIS — I10 Essential (primary) hypertension: Secondary | ICD-10-CM

## 2021-07-31 DIAGNOSIS — M542 Cervicalgia: Secondary | ICD-10-CM | POA: Diagnosis not present

## 2021-07-31 DIAGNOSIS — R41840 Attention and concentration deficit: Secondary | ICD-10-CM | POA: Diagnosis not present

## 2021-07-31 DIAGNOSIS — Z23 Encounter for immunization: Secondary | ICD-10-CM | POA: Diagnosis not present

## 2021-07-31 DIAGNOSIS — M66862 Spontaneous rupture of other tendons, left lower leg: Secondary | ICD-10-CM | POA: Diagnosis not present

## 2021-07-31 DIAGNOSIS — Z96652 Presence of left artificial knee joint: Secondary | ICD-10-CM | POA: Diagnosis not present

## 2021-07-31 DIAGNOSIS — Z471 Aftercare following joint replacement surgery: Secondary | ICD-10-CM | POA: Diagnosis not present

## 2021-07-31 NOTE — Progress Notes (Signed)
Location:  [facility]  POS: Clinic  Provider: Royal Hawthorn, ANP  Code Status: *** Goals of Care:     05/03/2021   11:32 AM  Advanced Directives  Does Patient Have a Medical Advance Directive? Yes  Type of Paramedic of [place];Living will;Out of facility DNR (pink MOST or yellow form)  Does patient want to make changes to medical advance directive? No - Patient declined  Copy of [place] in Chart? Yes - validated most recent copy scanned in chart (See row information)  Pre-existing out of facility DNR order (yellow form or pink MOST form) Yellow form placed in chart (order not valid for inpatient use)     Chief Complaint  Patient presents with  . Medical Management of Chronic Issues    Patient returns to the clinic for 3 month follow up.   . Quality Metric Gaps    Verified [facility] and [facility] patient is due for TDAP and dexa    HPI: Patient is a 87 y.o. female seen today for medical management of chronic diseases.    Reports some swelling in the back of her head and a funny feeling going down her neck Has some limitations in her left arm she says with some  She Underwent Left Patellar Repair on 11/21  Admitted in the hospital from 10/17-10/19 for left  total knee arthroplasty Last seen in April, will f/u in three months. Still has some warmth and swelling  Past Medical History:  Diagnosis Date  . Dementia ([place])   . Gait abnormality 06/20/2020  . Hypertension   . Hyponatremia   . Iron deficiency anemia   . Major depression   . OA (osteoarthritis)   . Pre-diabetes   . Vitamin B 12 deficiency     Past Surgical History:  Procedure Laterality Date  . ABDOMINAL HYSTERECTOMY    . COLONOSCOPY    . HEMANGIOMA EXCISION    . JOINT REPLACEMENT    . PATELLAR TENDON REPAIR Left 01/16/2021   Procedure: Left patellar tendon reconstruction;  Surgeon: Gaynelle Arabian, MD;  Location: [facility];  Service: Orthopedics;  Laterality: Left;  .  TONSILLECTOMY    . TOTAL HIP ARTHROPLASTY Right 02/18/2019   Procedure: TOTAL HIP ARTHROPLASTY ANTERIOR APPROACH;  Surgeon: Gaynelle Arabian, MD;  Location: [facility];  Service: Orthopedics;  Laterality: Right;  153mn  . TOTAL KNEE ARTHROPLASTY Right 05/27/2012   Procedure: TOTAL KNEE ARTHROPLASTY;  Surgeon: GMeredith Pel MD;  [facility]  Service: Orthopedics;  Laterality: Right;  Right Total Knee Arthroplasty  . TOTAL KNEE ARTHROPLASTY Left 12/12/2020   Procedure: TOTAL KNEE ARTHROPLASTY;  Surgeon: AGaynelle Arabian MD;  [facility];  Service: Orthopedics;  Laterality: Left;  . TOTAL KNEE REVISION Right 08/06/2018   Procedure: TOTAL KNEE REVISION;  Surgeon: AGaynelle Arabian MD;  [facility];  Service: Orthopedics;  Laterality: Right;  . TUBAL LIGATION      No Known Allergies  Outpatient Encounter Medications as of 07/31/2021  Medication Sig  . acetaminophen (TYLENOL) 325 MG tablet Take 650 mg by mouth 2 (two) times daily.  .Marland Kitchenacetaminophen (TYLENOL) 500 MG tablet Take 500 mg by mouth every 6 (six) hours as needed for moderate pain.  .Marland KitchenamLODipine (NORVASC) 5 MG tablet Take 1 tablet (5 mg total) by mouth in the morning.  .Marland Kitchenaspirin EC 81 MG tablet Take 81 mg by mouth daily.  . cholecalciferol (VITAMIN D3) 25 MCG (1000 UT) tablet Take 1,000 Units by mouth  daily.  . COMBIGAN 0.2-0.5 % ophthalmic solution Place 1 drop into the right eye in the morning and at bedtime.  . Cyanocobalamin (B-12) 2500 MCG TABS Take 1 tablet by mouth daily.  . dorzolamide (TRUSOPT) 2 % ophthalmic solution Place 1 drop into the right eye 2 (two) times daily.   . ferrous sulfate 325 (65 FE) MG tablet Take 325 mg by mouth daily with breakfast.  . lactose free nutrition (BOOST) LIQD Take 237 mLs by mouth daily.  Marland Kitchen latanoprost (XALATAN) 0.005 % ophthalmic solution Place 1 drop into both eyes at bedtime.   . methocarbamol (ROBAXIN) 500 MG tablet Take 0.5 tablets (250 mg total) by mouth at bedtime as needed for  muscle spasms.  . Multiple Vitamins-Minerals (PRESERVISION AREDS 2 PO) Take 1 capsule by mouth in the morning and at bedtime.  Marland Kitchen olmesartan (BENICAR) 40 MG tablet Take 1 tablet (40 mg total) by mouth daily.  . polyethylene glycol (MIRALAX / GLYCOLAX) 17 g packet Take 17 g by mouth daily. In the morning between 8-11 am   No facility-administered encounter medications on file as of 07/31/2021.    Review of Systems:  Review of Systems  Health Maintenance  Topic Date Due  . TETANUS/TDAP  Never done  . DEXA SCAN  Never done  . COVID-19 Vaccine (5 - Booster) 01/17/2022 (Originally 02/03/2021)  . INFLUENZA VACCINE  09/26/2021  . Pneumonia Vaccine 49+ Years old  Completed  . Zoster Vaccines- Shingrix  Completed  . HPV VACCINES  Aged Out    Physical Exam: Vitals:   07/31/21 1413  BP: 118/70  Pulse: 86  Temp: 98.1 F (36.7 C)  SpO2: 97%  Weight: 136 lb 12.8 oz (62.1 kg)  Height: '5\' 4"'$  (1.626 m)   Body mass index is 23.48 kg/m. Physical Exam  Labs reviewed: Basic Metabolic Panel: Recent Labs    12/21/20 1721 12/21/20 2050 12/22/20 1218 01/17/21 0310 01/18/21 0320 01/26/21 0000 03/27/21 1454 07/27/21 0000  NA 125*  --    < > 136 132* 138 139 142  K 4.6  --    < > 3.5 3.3* 4.5 4.4 3.9  CL 94*  --    < > 103 100 104 104 106  CO2 24  --    < > 28 27 24* 29 25*  GLUCOSE 119*  --    < > 90 103*  --  94  --   BUN 13  --    < > '9 10 11 15 19  '$ CREATININE 0.85  --    < > 0.50 0.51 0.5 0.81 0.6  CALCIUM 8.5*  --    < > 8.2* 8.2* 8.7 9.1 8.8  MG 1.8  --   --   --   --   --   --   --   PHOS 2.2*  --   --   --   --   --   --   --   TSH  --  1.488  --   --   --   --   --  1.54   < > = values in this interval not displayed.   Liver Function Tests: Recent Labs    12/02/20 1443 12/21/20 1721 12/29/20 0000 03/27/21 1454 07/27/21 0000  AST 14* '21 16 17 13  '$ ALT '12 27 15 19 9  '$ ALKPHOS 66 122 130* 60 61  BILITOT 0.3 0.5  --  0.3  --   PROT 7.1 6.9  --  6.9  --   ALBUMIN 3.4*  3.4* 3.7 4.0 4.2   No results for input(s): LIPASE, AMYLASE in the last 8760 hours. No results for input(s): AMMONIA in the last 8760 hours. CBC: Recent Labs    11/23/20 1434 12/02/20 1443 12/21/20 1721 12/29/20 0000 01/17/21 0310 01/18/21 0320 01/26/21 0000 03/27/21 1454 07/27/21 0000  WBC 6.3   < > 11.6*   < > 6.5 6.5 5.1 4.2 3.7  NEUTROABS 3.9  --  9.2*  --   --   --   --  2.2  --   HGB 9.8*   < > 10.4*   < > 9.4* 8.3* 9.4* 11.3* 12.1  HCT 29.6*   < > 31.9*   < > 28.4* 25.6* 30* 34.4* 36  MCV 92.5   < > 94.4  --  97.3 97.0  --  95.8  --   PLT 347   < > 349   < > 228 214 327 229 241   < > = values in this interval not displayed.   Lipid Panel: Recent Labs    07/27/21 0000  CHOL 122  HDL 71*  LDLCALC 40  TRIG 54   No results found for: HGBA1C  Procedures since last visit: No results found.  Assessment/Plan There are no diagnoses linked to this encounter.   Labs/tests ordered:  * No order type specified * Next appt:  Visit date not found   Total time **:  time greater than 50% of total time spent doing pt counseling and coordination of care    Tdap given

## 2021-07-31 NOTE — Patient Instructions (Signed)
Reduce iron to three times weekly and come back and we will check your lab work in three months.

## 2021-08-02 ENCOUNTER — Encounter: Payer: Self-pay | Admitting: Adult Health

## 2021-08-02 ENCOUNTER — Encounter (INDEPENDENT_AMBULATORY_CARE_PROVIDER_SITE_OTHER): Payer: Medicare Other | Admitting: Ophthalmology

## 2021-08-02 DIAGNOSIS — H353122 Nonexudative age-related macular degeneration, left eye, intermediate dry stage: Secondary | ICD-10-CM

## 2021-08-02 DIAGNOSIS — H43813 Vitreous degeneration, bilateral: Secondary | ICD-10-CM

## 2021-08-02 DIAGNOSIS — M62562 Muscle wasting and atrophy, not elsewhere classified, left lower leg: Secondary | ICD-10-CM | POA: Diagnosis not present

## 2021-08-02 DIAGNOSIS — M66862 Spontaneous rupture of other tendons, left lower leg: Secondary | ICD-10-CM | POA: Diagnosis not present

## 2021-08-02 DIAGNOSIS — I10 Essential (primary) hypertension: Secondary | ICD-10-CM

## 2021-08-02 DIAGNOSIS — H35033 Hypertensive retinopathy, bilateral: Secondary | ICD-10-CM

## 2021-08-02 DIAGNOSIS — Z96652 Presence of left artificial knee joint: Secondary | ICD-10-CM | POA: Diagnosis not present

## 2021-08-02 DIAGNOSIS — Z471 Aftercare following joint replacement surgery: Secondary | ICD-10-CM | POA: Diagnosis not present

## 2021-08-02 DIAGNOSIS — H353211 Exudative age-related macular degeneration, right eye, with active choroidal neovascularization: Secondary | ICD-10-CM

## 2021-08-02 DIAGNOSIS — R41840 Attention and concentration deficit: Secondary | ICD-10-CM | POA: Diagnosis not present

## 2021-08-02 DIAGNOSIS — M1712 Unilateral primary osteoarthritis, left knee: Secondary | ICD-10-CM | POA: Diagnosis not present

## 2021-08-02 DIAGNOSIS — R278 Other lack of coordination: Secondary | ICD-10-CM | POA: Diagnosis not present

## 2021-08-07 DIAGNOSIS — M1712 Unilateral primary osteoarthritis, left knee: Secondary | ICD-10-CM | POA: Diagnosis not present

## 2021-08-07 DIAGNOSIS — M62562 Muscle wasting and atrophy, not elsewhere classified, left lower leg: Secondary | ICD-10-CM | POA: Diagnosis not present

## 2021-08-07 DIAGNOSIS — R41840 Attention and concentration deficit: Secondary | ICD-10-CM | POA: Diagnosis not present

## 2021-08-07 DIAGNOSIS — Z471 Aftercare following joint replacement surgery: Secondary | ICD-10-CM | POA: Diagnosis not present

## 2021-08-07 DIAGNOSIS — R278 Other lack of coordination: Secondary | ICD-10-CM | POA: Diagnosis not present

## 2021-08-07 DIAGNOSIS — M66862 Spontaneous rupture of other tendons, left lower leg: Secondary | ICD-10-CM | POA: Diagnosis not present

## 2021-08-07 DIAGNOSIS — Z96652 Presence of left artificial knee joint: Secondary | ICD-10-CM | POA: Diagnosis not present

## 2021-08-09 DIAGNOSIS — Z96652 Presence of left artificial knee joint: Secondary | ICD-10-CM | POA: Diagnosis not present

## 2021-08-09 DIAGNOSIS — M66862 Spontaneous rupture of other tendons, left lower leg: Secondary | ICD-10-CM | POA: Diagnosis not present

## 2021-08-09 DIAGNOSIS — M62562 Muscle wasting and atrophy, not elsewhere classified, left lower leg: Secondary | ICD-10-CM | POA: Diagnosis not present

## 2021-08-09 DIAGNOSIS — R41840 Attention and concentration deficit: Secondary | ICD-10-CM | POA: Diagnosis not present

## 2021-08-09 DIAGNOSIS — Z471 Aftercare following joint replacement surgery: Secondary | ICD-10-CM | POA: Diagnosis not present

## 2021-08-09 DIAGNOSIS — M1712 Unilateral primary osteoarthritis, left knee: Secondary | ICD-10-CM | POA: Diagnosis not present

## 2021-08-09 DIAGNOSIS — R278 Other lack of coordination: Secondary | ICD-10-CM | POA: Diagnosis not present

## 2021-08-10 DIAGNOSIS — M62562 Muscle wasting and atrophy, not elsewhere classified, left lower leg: Secondary | ICD-10-CM | POA: Diagnosis not present

## 2021-08-10 DIAGNOSIS — Z471 Aftercare following joint replacement surgery: Secondary | ICD-10-CM | POA: Diagnosis not present

## 2021-08-10 DIAGNOSIS — R41840 Attention and concentration deficit: Secondary | ICD-10-CM | POA: Diagnosis not present

## 2021-08-10 DIAGNOSIS — R278 Other lack of coordination: Secondary | ICD-10-CM | POA: Diagnosis not present

## 2021-08-10 DIAGNOSIS — M66862 Spontaneous rupture of other tendons, left lower leg: Secondary | ICD-10-CM | POA: Diagnosis not present

## 2021-08-10 DIAGNOSIS — Z96652 Presence of left artificial knee joint: Secondary | ICD-10-CM | POA: Diagnosis not present

## 2021-08-10 DIAGNOSIS — M1712 Unilateral primary osteoarthritis, left knee: Secondary | ICD-10-CM | POA: Diagnosis not present

## 2021-08-14 DIAGNOSIS — M62562 Muscle wasting and atrophy, not elsewhere classified, left lower leg: Secondary | ICD-10-CM | POA: Diagnosis not present

## 2021-08-14 DIAGNOSIS — R41840 Attention and concentration deficit: Secondary | ICD-10-CM | POA: Diagnosis not present

## 2021-08-14 DIAGNOSIS — Z96652 Presence of left artificial knee joint: Secondary | ICD-10-CM | POA: Diagnosis not present

## 2021-08-14 DIAGNOSIS — R278 Other lack of coordination: Secondary | ICD-10-CM | POA: Diagnosis not present

## 2021-08-14 DIAGNOSIS — M1712 Unilateral primary osteoarthritis, left knee: Secondary | ICD-10-CM | POA: Diagnosis not present

## 2021-08-14 DIAGNOSIS — M66862 Spontaneous rupture of other tendons, left lower leg: Secondary | ICD-10-CM | POA: Diagnosis not present

## 2021-08-14 DIAGNOSIS — Z471 Aftercare following joint replacement surgery: Secondary | ICD-10-CM | POA: Diagnosis not present

## 2021-08-16 DIAGNOSIS — M66862 Spontaneous rupture of other tendons, left lower leg: Secondary | ICD-10-CM | POA: Diagnosis not present

## 2021-08-16 DIAGNOSIS — Z471 Aftercare following joint replacement surgery: Secondary | ICD-10-CM | POA: Diagnosis not present

## 2021-08-16 DIAGNOSIS — R278 Other lack of coordination: Secondary | ICD-10-CM | POA: Diagnosis not present

## 2021-08-16 DIAGNOSIS — M1712 Unilateral primary osteoarthritis, left knee: Secondary | ICD-10-CM | POA: Diagnosis not present

## 2021-08-16 DIAGNOSIS — R41840 Attention and concentration deficit: Secondary | ICD-10-CM | POA: Diagnosis not present

## 2021-08-16 DIAGNOSIS — Z96652 Presence of left artificial knee joint: Secondary | ICD-10-CM | POA: Diagnosis not present

## 2021-08-16 DIAGNOSIS — M62562 Muscle wasting and atrophy, not elsewhere classified, left lower leg: Secondary | ICD-10-CM | POA: Diagnosis not present

## 2021-08-17 DIAGNOSIS — M62562 Muscle wasting and atrophy, not elsewhere classified, left lower leg: Secondary | ICD-10-CM | POA: Diagnosis not present

## 2021-08-17 DIAGNOSIS — Z471 Aftercare following joint replacement surgery: Secondary | ICD-10-CM | POA: Diagnosis not present

## 2021-08-17 DIAGNOSIS — Z96652 Presence of left artificial knee joint: Secondary | ICD-10-CM | POA: Diagnosis not present

## 2021-08-17 DIAGNOSIS — R278 Other lack of coordination: Secondary | ICD-10-CM | POA: Diagnosis not present

## 2021-08-17 DIAGNOSIS — M1712 Unilateral primary osteoarthritis, left knee: Secondary | ICD-10-CM | POA: Diagnosis not present

## 2021-08-17 DIAGNOSIS — R41840 Attention and concentration deficit: Secondary | ICD-10-CM | POA: Diagnosis not present

## 2021-08-17 DIAGNOSIS — M66862 Spontaneous rupture of other tendons, left lower leg: Secondary | ICD-10-CM | POA: Diagnosis not present

## 2021-08-21 DIAGNOSIS — M62562 Muscle wasting and atrophy, not elsewhere classified, left lower leg: Secondary | ICD-10-CM | POA: Diagnosis not present

## 2021-08-21 DIAGNOSIS — Z471 Aftercare following joint replacement surgery: Secondary | ICD-10-CM | POA: Diagnosis not present

## 2021-08-21 DIAGNOSIS — R278 Other lack of coordination: Secondary | ICD-10-CM | POA: Diagnosis not present

## 2021-08-21 DIAGNOSIS — M1712 Unilateral primary osteoarthritis, left knee: Secondary | ICD-10-CM | POA: Diagnosis not present

## 2021-08-21 DIAGNOSIS — R41840 Attention and concentration deficit: Secondary | ICD-10-CM | POA: Diagnosis not present

## 2021-08-21 DIAGNOSIS — Z96652 Presence of left artificial knee joint: Secondary | ICD-10-CM | POA: Diagnosis not present

## 2021-08-21 DIAGNOSIS — M66862 Spontaneous rupture of other tendons, left lower leg: Secondary | ICD-10-CM | POA: Diagnosis not present

## 2021-08-25 DIAGNOSIS — Z96652 Presence of left artificial knee joint: Secondary | ICD-10-CM | POA: Diagnosis not present

## 2021-08-25 DIAGNOSIS — M62562 Muscle wasting and atrophy, not elsewhere classified, left lower leg: Secondary | ICD-10-CM | POA: Diagnosis not present

## 2021-08-25 DIAGNOSIS — M66862 Spontaneous rupture of other tendons, left lower leg: Secondary | ICD-10-CM | POA: Diagnosis not present

## 2021-08-25 DIAGNOSIS — R278 Other lack of coordination: Secondary | ICD-10-CM | POA: Diagnosis not present

## 2021-08-25 DIAGNOSIS — M1712 Unilateral primary osteoarthritis, left knee: Secondary | ICD-10-CM | POA: Diagnosis not present

## 2021-08-25 DIAGNOSIS — R41840 Attention and concentration deficit: Secondary | ICD-10-CM | POA: Diagnosis not present

## 2021-08-25 DIAGNOSIS — Z471 Aftercare following joint replacement surgery: Secondary | ICD-10-CM | POA: Diagnosis not present

## 2021-08-28 DIAGNOSIS — Z471 Aftercare following joint replacement surgery: Secondary | ICD-10-CM | POA: Diagnosis not present

## 2021-08-28 DIAGNOSIS — R41840 Attention and concentration deficit: Secondary | ICD-10-CM | POA: Diagnosis not present

## 2021-08-28 DIAGNOSIS — M66862 Spontaneous rupture of other tendons, left lower leg: Secondary | ICD-10-CM | POA: Diagnosis not present

## 2021-08-28 DIAGNOSIS — Z96652 Presence of left artificial knee joint: Secondary | ICD-10-CM | POA: Diagnosis not present

## 2021-08-28 DIAGNOSIS — M1712 Unilateral primary osteoarthritis, left knee: Secondary | ICD-10-CM | POA: Diagnosis not present

## 2021-08-28 DIAGNOSIS — M62562 Muscle wasting and atrophy, not elsewhere classified, left lower leg: Secondary | ICD-10-CM | POA: Diagnosis not present

## 2021-08-28 DIAGNOSIS — R278 Other lack of coordination: Secondary | ICD-10-CM | POA: Diagnosis not present

## 2021-08-30 DIAGNOSIS — R41840 Attention and concentration deficit: Secondary | ICD-10-CM | POA: Diagnosis not present

## 2021-08-30 DIAGNOSIS — Z471 Aftercare following joint replacement surgery: Secondary | ICD-10-CM | POA: Diagnosis not present

## 2021-08-30 DIAGNOSIS — M66862 Spontaneous rupture of other tendons, left lower leg: Secondary | ICD-10-CM | POA: Diagnosis not present

## 2021-08-30 DIAGNOSIS — M1712 Unilateral primary osteoarthritis, left knee: Secondary | ICD-10-CM | POA: Diagnosis not present

## 2021-08-30 DIAGNOSIS — Z96652 Presence of left artificial knee joint: Secondary | ICD-10-CM | POA: Diagnosis not present

## 2021-08-30 DIAGNOSIS — R278 Other lack of coordination: Secondary | ICD-10-CM | POA: Diagnosis not present

## 2021-08-30 DIAGNOSIS — M62562 Muscle wasting and atrophy, not elsewhere classified, left lower leg: Secondary | ICD-10-CM | POA: Diagnosis not present

## 2021-09-01 ENCOUNTER — Ambulatory Visit (INDEPENDENT_AMBULATORY_CARE_PROVIDER_SITE_OTHER): Payer: Medicare Other | Admitting: Nurse Practitioner

## 2021-09-01 ENCOUNTER — Encounter: Payer: Self-pay | Admitting: Nurse Practitioner

## 2021-09-01 VITALS — BP 128/72 | HR 90 | Temp 96.0°F

## 2021-09-01 DIAGNOSIS — R41840 Attention and concentration deficit: Secondary | ICD-10-CM | POA: Diagnosis not present

## 2021-09-01 DIAGNOSIS — Z471 Aftercare following joint replacement surgery: Secondary | ICD-10-CM | POA: Diagnosis not present

## 2021-09-01 DIAGNOSIS — M1712 Unilateral primary osteoarthritis, left knee: Secondary | ICD-10-CM | POA: Diagnosis not present

## 2021-09-01 DIAGNOSIS — M66862 Spontaneous rupture of other tendons, left lower leg: Secondary | ICD-10-CM | POA: Diagnosis not present

## 2021-09-01 DIAGNOSIS — R600 Localized edema: Secondary | ICD-10-CM

## 2021-09-01 DIAGNOSIS — I10 Essential (primary) hypertension: Secondary | ICD-10-CM | POA: Diagnosis not present

## 2021-09-01 DIAGNOSIS — M62562 Muscle wasting and atrophy, not elsewhere classified, left lower leg: Secondary | ICD-10-CM | POA: Diagnosis not present

## 2021-09-01 DIAGNOSIS — Z96652 Presence of left artificial knee joint: Secondary | ICD-10-CM | POA: Diagnosis not present

## 2021-09-01 DIAGNOSIS — R278 Other lack of coordination: Secondary | ICD-10-CM | POA: Diagnosis not present

## 2021-09-01 MED ORDER — NEBIVOLOL HCL 5 MG PO TABS: 5.0000 mg | ORAL_TABLET | Freq: Every day | ORAL | 0 refills | Status: DC

## 2021-09-01 NOTE — Progress Notes (Signed)
Careteam: Patient Care Team: Virgie Dad, MD as PCP - General (Internal Medicine)  PLACE OF SERVICE:  [place] Directive information    No Known Allergies  Chief Complaint  Patient presents with   Acute Visit    Patient presents today for bilateral ankle swelling and right leg. She denies any pain but reports right lower leg weeping/ drainage for 1 day.     HPI: Patient is a 87 y.o. female  to swelling in lower legs.  No pain, or worsening redness noted - she has some chronic venous changes noted.  Reports she has had swelling in legs, comes an does.  She reports she went to a vascular doctor in the past due to issues with veins. Has had some redness to right leg chronically.  They recommended that she wear compression hose to her leg but she has not done this.  Does admit to sodium use and adding salt to her food.  No chest pains or shortness of breath.  She has had left knee replacement which has made mobility hard- reports she has had two surgeries on that knee.  Yesterday swelling became worse and had weeping over night that got her sheets wet which was concerning.   Review of Systems:  Review of Systems  Constitutional:  Negative for chills, fever and weight loss.  Cardiovascular:  Positive for leg swelling. Negative for chest pain and palpitations.  Neurological:  Positive for weakness (limited mobility).  Psychiatric/Behavioral:  Positive for memory loss.     Past Medical History:  Diagnosis Date   Dementia ([place])    Gait abnormality 06/20/2020   Hypertension    Hyponatremia    Iron deficiency anemia    Major depression    OA (osteoarthritis)    Pre-diabetes    Vitamin B 12 deficiency    Past Surgical History:  Procedure Laterality Date   ABDOMINAL HYSTERECTOMY     COLONOSCOPY     HEMANGIOMA EXCISION     JOINT REPLACEMENT     PATELLAR TENDON REPAIR Left 01/16/2021   Procedure: Left patellar tendon reconstruction;  Surgeon: Gaynelle Arabian, MD;  Location: [facility];  Service: Orthopedics;  Laterality: Left;   TONSILLECTOMY     TOTAL HIP ARTHROPLASTY Right 02/18/2019   Procedure: TOTAL HIP ARTHROPLASTY ANTERIOR APPROACH;  Surgeon: Gaynelle Arabian, MD;  Location: [facility];  Service: Orthopedics;  Laterality: Right;  171mn   TOTAL KNEE ARTHROPLASTY Right 05/27/2012   Procedure: TOTAL KNEE ARTHROPLASTY;  Surgeon: GMeredith Pel MD;  [place] [facility]  Service: Orthopedics;  Laterality: Right;  Right Total Knee Arthroplasty   TOTAL KNEE ARTHROPLASTY Left 12/12/2020   Procedure: TOTAL KNEE ARTHROPLASTY;  Surgeon: AGaynelle Arabian MD;  [facility];  Service: Orthopedics;  Laterality: Left;   TOTAL KNEE REVISION Right 08/06/2018   Procedure: TOTAL KNEE REVISION;  Surgeon: AGaynelle Arabian MD;  Location: [facility];  Service: Orthopedics;  Laterality: Right;   TUBAL LIGATION     Social History:   reports that she quit smoking about 24 years ago. Her smoking use included cigarettes. She has never used smokeless tobacco. She reports current alcohol use of about 1.0 standard drink of alcohol per week. She reports that she does not use drugs.  Family History  Problem Relation Age of Onset   Cancer Mother    Heart disease Father    Cancer Brother    Asthma Brother     Medications: Patient's Medications  New Prescriptions  NEBIVOLOL (BYSTOLIC) 5 MG TABLET    Take 1 tablet (5 mg total) by mouth daily.  Previous Medications   ACETAMINOPHEN (TYLENOL) 325 MG TABLET    Take 650 mg by mouth 2 (two) times daily.   ACETAMINOPHEN (TYLENOL) 500 MG TABLET    Take 500 mg by mouth every 6 (six) hours as needed for moderate pain.   ASPIRIN EC 81 MG TABLET    Take 81 mg by mouth daily.   CHOLECALCIFEROL (VITAMIN D3) 25 MCG (1000 UT) TABLET    Take 1,000 Units by mouth daily.   COMBIGAN 0.2-0.5 % OPHTHALMIC SOLUTION    Place 1 drop into the right eye in the morning and at bedtime.   CYANOCOBALAMIN (B-12) 2500 MCG TABS    Take 1 tablet by mouth  daily.   DORZOLAMIDE (TRUSOPT) 2 % OPHTHALMIC SOLUTION    Place 1 drop into the right eye 2 (two) times daily.    FERROUS SULFATE 325 (65 FE) MG TABLET    Take 325 mg by mouth 3 (three) times a week.   LACTOSE FREE NUTRITION (BOOST) LIQD    Take 237 mLs by mouth daily.   LATANOPROST (XALATAN) 0.005 % OPHTHALMIC SOLUTION    Place 1 drop into both eyes at bedtime.    METHOCARBAMOL (ROBAXIN) 500 MG TABLET    Take 0.5 tablets (250 mg total) by mouth at bedtime as needed for muscle spasms.   MULTIPLE VITAMINS-MINERALS (PRESERVISION AREDS 2 PO)    Take 1 capsule by mouth in the morning and at bedtime.   OLMESARTAN (BENICAR) 40 MG TABLET    Take 1 tablet (40 mg total) by mouth daily.   POLYETHYLENE GLYCOL (MIRALAX / GLYCOLAX) 17 G PACKET    Take 17 g by mouth daily. In the morning between 8-11 am  Modified Medications   No medications on file  Discontinued Medications   AMLODIPINE (NORVASC) 5 MG TABLET    Take 1 tablet (5 mg total) by mouth in the morning.    Physical Exam:  Vitals:   09/01/21 1409  BP: 128/72  Pulse: 90  Temp: (!) 96 F (35.6 C)  SpO2: 98%   There is no height or weight on file to calculate BMI. Wt Readings from Last 3 Encounters:  07/31/21 136 lb 12.8 oz (62.1 kg)  03/28/21 127 lb 9.6 oz (57.9 kg)  03/21/21 126 lb 9.6 oz (57.4 kg)    Physical Exam Constitutional:      General: She is not in acute distress.    Appearance: She is well-developed. She is not diaphoretic.  HENT:     Head: Normocephalic and atraumatic.     Mouth/Throat:     Pharynx: No oropharyngeal exudate.  Eyes:     Conjunctiva/sclera: Conjunctivae normal.     Pupils: Pupils are equal, round, and reactive to light.  Cardiovascular:     Rate and Rhythm: Normal rate and regular rhythm.     Heart sounds: Normal heart sounds.  Pulmonary:     Effort: Pulmonary effort is normal.     Breath sounds: Normal breath sounds.  Abdominal:     General: Bowel sounds are normal.     Palpations: Abdomen is  soft.  Musculoskeletal:     Cervical back: Normal range of motion and neck supple.     Right lower leg: Edema (2+, pitting with small area with clear drainage) present.     Left lower leg: Edema (1+) present.  Skin:    General: Skin is  warm and dry.  Neurological:     Mental Status: She is alert.  Psychiatric:        Mood and Affect: Mood normal.     Labs reviewed: Basic Metabolic Panel: Recent Labs    12/21/20 1721 12/21/20 2050 12/22/20 1218 01/17/21 0310 01/18/21 0320 01/26/21 0000 03/27/21 1454 07/27/21 0000  NA 125*  --    < > 136 132* 138 139 142  K 4.6  --    < > 3.5 3.3* 4.5 4.4 3.9  CL 94*  --    < > 103 100 104 104 106  CO2 24  --    < > 28 27 24* 29 25*  GLUCOSE 119*  --    < > 90 103*  --  94  --   BUN 13  --    < > '9 10 11 15 19  '$ CREATININE 0.85  --    < > 0.50 0.51 0.5 0.81 0.6  CALCIUM 8.5*  --    < > 8.2* 8.2* 8.7 9.1 8.8  MG 1.8  --   --   --   --   --   --   --   PHOS 2.2*  --   --   --   --   --   --   --   TSH  --  1.488  --   --   --   --   --  1.54   < > = values in this interval not displayed.   Liver Function Tests: Recent Labs    12/02/20 1443 12/21/20 1721 12/29/20 0000 03/27/21 1454 07/27/21 0000  AST 14* '21 16 17 13  '$ ALT '12 27 15 19 9  '$ ALKPHOS 66 122 130* 60 61  BILITOT 0.3 0.5  --  0.3  --   PROT 7.1 6.9  --  6.9  --   ALBUMIN 3.4* 3.4* 3.7 4.0 4.2   No results for input(s): "LIPASE", "AMYLASE" in the last 8760 hours. No results for input(s): "AMMONIA" in the last 8760 hours. CBC: Recent Labs    11/23/20 1434 12/02/20 1443 12/21/20 1721 12/29/20 0000 01/17/21 0310 01/18/21 0320 01/26/21 0000 03/27/21 1454 07/27/21 0000  WBC 6.3   < > 11.6*   < > 6.5 6.5 5.1 4.2 3.7  NEUTROABS 3.9  --  9.2*  --   --   --   --  2.2  --   HGB 9.8*   < > 10.4*   < > 9.4* 8.3* 9.4* 11.3* 12.1  HCT 29.6*   < > 31.9*   < > 28.4* 25.6* 30* 34.4* 36  MCV 92.5   < > 94.4  --  97.3 97.0  --  95.8  --   PLT 347   < > 349   < > 228 214 327 229  241   < > = values in this interval not displayed.   Lipid Panel: Recent Labs    07/27/21 0000  CHOL 122  HDL 71*  LDLCALC 40  TRIG 54   TSH: Recent Labs    12/21/20 2050 07/27/21 0000  TSH 1.488 1.54   A1C: No results found for: "HGBA1C"   Assessment/Plan 1. Essential hypertension -will stop norvasc due to LE edema and add bystolic - nebivolol (BYSTOLIC) 5 MG tablet; Take 1 tablet (5 mg total) by mouth daily.  Dispense: 30 tablet; Refill: 0  2. Lower leg edema Worse on right vs left with small open  area causing drainage to right leg.  -skin very frail appearing to bilateral legs.  4x4s applied to area then wrapped right lower leg with kerlex to help with edema and drainge. To change at least twice daily or more if needed -to monitor for increase in redness, pain, swelling.  -will stop Norvasc as this could be making swelling worse.  -she also eats foods high in sodium, educated her on diet and no added salt/low sodium diet.  -to elevated legs when sitting -also discussed not crossing her legs while sitting which she does frequently.  -compression hose once weeping/drainage has stopped    To follow up in 4 days with Alyse Low, Np at [facility] on bp and LE edema.  Carlos American. [place], [facility] [phone]

## 2021-09-01 NOTE — Patient Instructions (Addendum)
-  encouraged to elevate legs above level of heart as tolerates  Make sure you are eating 3 meals daily with good nutrition and protein.  low sodium diet compression hose as tolerates (on in am, off in pm)  Can use kerlex to wrap leg to help with swelling and help absorb weeping.   To wrap legs, if becomes wet change.    Stop norvasc Start bystolic

## 2021-09-04 ENCOUNTER — Encounter: Payer: Self-pay | Admitting: Adult Health

## 2021-09-04 ENCOUNTER — Non-Acute Institutional Stay: Payer: Medicare Other | Admitting: Adult Health

## 2021-09-04 VITALS — BP 128/76 | HR 59 | Temp 98.4°F | Ht 64.0 in | Wt 134.6 lb

## 2021-09-04 DIAGNOSIS — M62562 Muscle wasting and atrophy, not elsewhere classified, left lower leg: Secondary | ICD-10-CM | POA: Diagnosis not present

## 2021-09-04 DIAGNOSIS — M66862 Spontaneous rupture of other tendons, left lower leg: Secondary | ICD-10-CM | POA: Diagnosis not present

## 2021-09-04 DIAGNOSIS — Z471 Aftercare following joint replacement surgery: Secondary | ICD-10-CM | POA: Diagnosis not present

## 2021-09-04 DIAGNOSIS — R278 Other lack of coordination: Secondary | ICD-10-CM | POA: Diagnosis not present

## 2021-09-04 DIAGNOSIS — R41840 Attention and concentration deficit: Secondary | ICD-10-CM | POA: Diagnosis not present

## 2021-09-04 DIAGNOSIS — R600 Localized edema: Secondary | ICD-10-CM

## 2021-09-04 DIAGNOSIS — I10 Essential (primary) hypertension: Secondary | ICD-10-CM | POA: Diagnosis not present

## 2021-09-04 DIAGNOSIS — M1712 Unilateral primary osteoarthritis, left knee: Secondary | ICD-10-CM | POA: Diagnosis not present

## 2021-09-04 DIAGNOSIS — Z96652 Presence of left artificial knee joint: Secondary | ICD-10-CM | POA: Diagnosis not present

## 2021-09-04 NOTE — Progress Notes (Signed)
Location:  [facility]  POS: Clinic  Provider: Royal Hawthorn, ANP  Code Status: DNR Goals of Care:     05/03/2021   11:32 AM  Advanced Directives  Does Patient Have a Medical Advance Directive? Yes  Type of Paramedic of [place];Living will;Out of facility DNR (pink MOST or yellow form)  Does patient want to make changes to medical advance directive? No - Patient declined  Copy of [place] in Chart? Yes - validated most recent copy scanned in chart (See row information)  Pre-existing out of facility DNR order (yellow form or pink MOST form) Yellow form placed in chart (order not valid for inpatient use)     Chief Complaint  Patient presents with   Medical Management of Chronic Issues    Patient returns to the clinic for follow up.    Quality Metric Gaps    Discuss need for dexa scan    HPI: Patient is a 87 y.o. female seen today for f/u regarding edema.   She has been using zip up compression hose and reports the swelling has improved. She Is no longer having weeping. No ulcerations or pain. Denies any sob. Denies calf pain.  Also was changed to bystolic from norvasc. BP is 128/76.   Her husband reports that several years ago he was told she had valve incompetence.  The swelling is worse on the right. I reviewed two ultrasounds of the left lower ext. No issues were noted except a bakers cyst on the left.  She has reduced mobility due to a left total knee with patellar tendon rupture and subsequent repair in 2021. She can walk with a walker in her home. Uses a motorized chair for longer distances.  Past Medical History:  Diagnosis Date   Dementia ([place])    Gait abnormality 06/20/2020   Hypertension    Hyponatremia    Iron deficiency anemia    Major depression    OA (osteoarthritis)    Pre-diabetes    Vitamin B 12 deficiency     Past Surgical History:  Procedure Laterality Date   ABDOMINAL HYSTERECTOMY     COLONOSCOPY      HEMANGIOMA EXCISION     JOINT REPLACEMENT     PATELLAR TENDON REPAIR Left 01/16/2021   Procedure: Left patellar tendon reconstruction;  Surgeon: Gaynelle Arabian, MD;  Location: [facility];  Service: Orthopedics;  Laterality: Left;   TONSILLECTOMY     TOTAL HIP ARTHROPLASTY Right 02/18/2019   Procedure: TOTAL HIP ARTHROPLASTY ANTERIOR APPROACH;  Surgeon: Gaynelle Arabian, MD;  Location: [facility];  Service: Orthopedics;  Laterality: Right;  127mn   TOTAL KNEE ARTHROPLASTY Right 05/27/2012   Procedure: TOTAL KNEE ARTHROPLASTY;  Surgeon: GMeredith Pel MD;  [facility]  Service: Orthopedics;  Laterality: Right;  Right Total Knee Arthroplasty   TOTAL KNEE ARTHROPLASTY Left 12/12/2020   Procedure: TOTAL KNEE ARTHROPLASTY;  Surgeon: AGaynelle Arabian MD;  [facility];  Service: Orthopedics;  Laterality: Left;   TOTAL KNEE REVISION Right 08/06/2018   Procedure: TOTAL KNEE REVISION;  Surgeon: AGaynelle Arabian MD;  [facility];  Service: Orthopedics;  Laterality: Right;   TUBAL LIGATION      No Known Allergies  Outpatient Encounter Medications as of 09/04/2021  Medication Sig   acetaminophen (TYLENOL) 500 MG tablet Take 500 mg by mouth every 6 (six) hours as needed for moderate pain.   aspirin EC 81 MG tablet Take 81 mg by mouth daily.   cholecalciferol (  VITAMIN D3) 25 MCG (1000 UT) tablet Take 1,000 Units by mouth daily.   COMBIGAN 0.2-0.5 % ophthalmic solution Place 1 drop into the right eye in the morning and at bedtime.   Cyanocobalamin (B-12) 2500 MCG TABS Take 1 tablet by mouth daily.   dorzolamide (TRUSOPT) 2 % ophthalmic solution Place 1 drop into the right eye 2 (two) times daily.    ferrous sulfate 325 (65 FE) MG tablet Take 325 mg by mouth 3 (three) times a week.   latanoprost (XALATAN) 0.005 % ophthalmic solution Place 1 drop into both eyes at bedtime.    methocarbamol (ROBAXIN) 500 MG tablet Take 0.5 tablets (250 mg total) by mouth at bedtime as needed for muscle spasms.    Multiple Vitamins-Minerals (PRESERVISION AREDS 2 PO) Take 1 capsule by mouth in the morning and at bedtime.   nebivolol (BYSTOLIC) 5 MG tablet Take 1 tablet (5 mg total) by mouth daily.   olmesartan (BENICAR) 40 MG tablet Take 1 tablet (40 mg total) by mouth daily.   polyethylene glycol (MIRALAX / GLYCOLAX) 17 g packet Take 17 g by mouth daily. In the morning between 8-11 am   [DISCONTINUED] acetaminophen (TYLENOL) 325 MG tablet Take 650 mg by mouth 2 (two) times daily.   [DISCONTINUED] lactose free nutrition (BOOST) LIQD Take 237 mLs by mouth daily.   No facility-administered encounter medications on file as of 09/04/2021.    Review of Systems:  Review of Systems  Constitutional:  Negative for activity change, appetite change, chills, diaphoresis, fatigue, fever and unexpected weight change.  HENT:  Negative for congestion.   Respiratory:  Negative for cough, shortness of breath and wheezing.   Cardiovascular:  Positive for leg swelling. Negative for chest pain and palpitations.  Gastrointestinal:  Negative for abdominal distention, abdominal pain, constipation and diarrhea.  Genitourinary:  Negative for difficulty urinating and dysuria.  Musculoskeletal:  Positive for gait problem. Negative for arthralgias, back pain, joint swelling and myalgias.  Neurological:  Negative for dizziness, tremors, seizures, syncope, facial asymmetry, speech difficulty, weakness, light-headedness, numbness and headaches.  Psychiatric/Behavioral:  Negative for agitation, behavioral problems and confusion.        Memory loss    Health Maintenance  Topic Date Due   DEXA SCAN  Never done   COVID-19 Vaccine (5 - Booster) 01/17/2022 (Originally 02/03/2021)   INFLUENZA VACCINE  09/26/2021   TETANUS/TDAP  08/01/2031   Pneumonia Vaccine 58+ Years old  Completed   Zoster Vaccines- Shingrix  Completed   HPV VACCINES  Aged Out    Physical Exam: Vitals:   09/04/21 1529  BP: 128/76  Pulse: (!) 59  Temp: 98.4 F  (36.9 C)  SpO2: 96%  Weight: 134 lb 9.6 oz (61.1 kg)  Height: '5\' 4"'$  (1.626 m)   Body mass index is 23.1 kg/m. Physical Exam Vitals and nursing note reviewed.  Constitutional:      Appearance: Normal appearance.  Cardiovascular:     Rate and Rhythm: Normal rate and regular rhythm.  Pulmonary:     Effort: Pulmonary effort is normal.     Breath sounds: Normal breath sounds.  Musculoskeletal:     Right lower leg: Edema (+1) present.     Left lower leg: Edema (trace) present.  Skin:    General: Skin is warm and dry.     Findings: No erythema.     Comments: Neg homans sign. No redness or TTP to either calf.   Neurological:     General: No focal deficit present.  Mental Status: She is alert. Mental status is at baseline.  Psychiatric:        Mood and Affect: Mood normal.     Labs reviewed: Basic Metabolic Panel: Recent Labs    12/21/20 1721 12/21/20 2050 12/22/20 1218 01/17/21 0310 01/18/21 0320 01/26/21 0000 03/27/21 1454 07/27/21 0000  NA 125*  --    < > 136 132* 138 139 142  K 4.6  --    < > 3.5 3.3* 4.5 4.4 3.9  CL 94*  --    < > 103 100 104 104 106  CO2 24  --    < > 28 27 24* 29 25*  GLUCOSE 119*  --    < > 90 103*  --  94  --   BUN 13  --    < > '9 10 11 15 19  '$ CREATININE 0.85  --    < > 0.50 0.51 0.5 0.81 0.6  CALCIUM 8.5*  --    < > 8.2* 8.2* 8.7 9.1 8.8  MG 1.8  --   --   --   --   --   --   --   PHOS 2.2*  --   --   --   --   --   --   --   TSH  --  1.488  --   --   --   --   --  1.54   < > = values in this interval not displayed.   Liver Function Tests: Recent Labs    12/02/20 1443 12/21/20 1721 12/29/20 0000 03/27/21 1454 07/27/21 0000  AST 14* '21 16 17 13  '$ ALT '12 27 15 19 9  '$ ALKPHOS 66 122 130* 60 61  BILITOT 0.3 0.5  --  0.3  --   PROT 7.1 6.9  --  6.9  --   ALBUMIN 3.4* 3.4* 3.7 4.0 4.2   No results for input(s): "LIPASE", "AMYLASE" in the last 8760 hours. No results for input(s): "AMMONIA" in the last 8760 hours. CBC: Recent Labs     11/23/20 1434 12/02/20 1443 12/21/20 1721 12/29/20 0000 01/17/21 0310 01/18/21 0320 01/26/21 0000 03/27/21 1454 07/27/21 0000  WBC 6.3   < > 11.6*   < > 6.5 6.5 5.1 4.2 3.7  NEUTROABS 3.9  --  9.2*  --   --   --   --  2.2  --   HGB 9.8*   < > 10.4*   < > 9.4* 8.3* 9.4* 11.3* 12.1  HCT 29.6*   < > 31.9*   < > 28.4* 25.6* 30* 34.4* 36  MCV 92.5   < > 94.4  --  97.3 97.0  --  95.8  --   PLT 347   < > 349   < > 228 214 327 229 241   < > = values in this interval not displayed.   Lipid Panel: Recent Labs    07/27/21 0000  CHOL 122  HDL 71*  LDLCALC 40  TRIG 54   No results found for: "HGBA1C"  Procedures since last visit: No results found.  Assessment/Plan  1. Localized edema Improved Continue compression hose Low salt diet Keep legs elevated If worsening notify [place] for possible diuretic. Reviewed labs recently done WIll order more prior to next apt   2. Essential hypertension Controlled Continue bystolic and benicar  Labs/tests ordered:  * No order type specified * CBC BMP liver function prior to next apt.  Next appt:  11/08/2021   Total time 37mn:  time greater than 50% of total time spent doing pt counseling and coordination of care

## 2021-09-11 DIAGNOSIS — M62562 Muscle wasting and atrophy, not elsewhere classified, left lower leg: Secondary | ICD-10-CM | POA: Diagnosis not present

## 2021-09-11 DIAGNOSIS — M66862 Spontaneous rupture of other tendons, left lower leg: Secondary | ICD-10-CM | POA: Diagnosis not present

## 2021-09-11 DIAGNOSIS — Z96652 Presence of left artificial knee joint: Secondary | ICD-10-CM | POA: Diagnosis not present

## 2021-09-11 DIAGNOSIS — M1712 Unilateral primary osteoarthritis, left knee: Secondary | ICD-10-CM | POA: Diagnosis not present

## 2021-09-11 DIAGNOSIS — R41840 Attention and concentration deficit: Secondary | ICD-10-CM | POA: Diagnosis not present

## 2021-09-11 DIAGNOSIS — Z471 Aftercare following joint replacement surgery: Secondary | ICD-10-CM | POA: Diagnosis not present

## 2021-09-11 DIAGNOSIS — R278 Other lack of coordination: Secondary | ICD-10-CM | POA: Diagnosis not present

## 2021-09-13 ENCOUNTER — Encounter (INDEPENDENT_AMBULATORY_CARE_PROVIDER_SITE_OTHER): Payer: Medicare Other | Admitting: Ophthalmology

## 2021-09-13 DIAGNOSIS — H353122 Nonexudative age-related macular degeneration, left eye, intermediate dry stage: Secondary | ICD-10-CM | POA: Diagnosis not present

## 2021-09-13 DIAGNOSIS — I10 Essential (primary) hypertension: Secondary | ICD-10-CM

## 2021-09-13 DIAGNOSIS — H35033 Hypertensive retinopathy, bilateral: Secondary | ICD-10-CM | POA: Diagnosis not present

## 2021-09-13 DIAGNOSIS — H353211 Exudative age-related macular degeneration, right eye, with active choroidal neovascularization: Secondary | ICD-10-CM | POA: Diagnosis not present

## 2021-09-13 DIAGNOSIS — H43813 Vitreous degeneration, bilateral: Secondary | ICD-10-CM | POA: Diagnosis not present

## 2021-09-15 DIAGNOSIS — R278 Other lack of coordination: Secondary | ICD-10-CM | POA: Diagnosis not present

## 2021-09-15 DIAGNOSIS — R41840 Attention and concentration deficit: Secondary | ICD-10-CM | POA: Diagnosis not present

## 2021-09-15 DIAGNOSIS — Z471 Aftercare following joint replacement surgery: Secondary | ICD-10-CM | POA: Diagnosis not present

## 2021-09-15 DIAGNOSIS — M62562 Muscle wasting and atrophy, not elsewhere classified, left lower leg: Secondary | ICD-10-CM | POA: Diagnosis not present

## 2021-09-15 DIAGNOSIS — Z96652 Presence of left artificial knee joint: Secondary | ICD-10-CM | POA: Diagnosis not present

## 2021-09-15 DIAGNOSIS — M66862 Spontaneous rupture of other tendons, left lower leg: Secondary | ICD-10-CM | POA: Diagnosis not present

## 2021-09-15 DIAGNOSIS — M1712 Unilateral primary osteoarthritis, left knee: Secondary | ICD-10-CM | POA: Diagnosis not present

## 2021-09-18 ENCOUNTER — Telehealth: Payer: Self-pay

## 2021-09-18 DIAGNOSIS — R278 Other lack of coordination: Secondary | ICD-10-CM | POA: Diagnosis not present

## 2021-09-18 DIAGNOSIS — M62562 Muscle wasting and atrophy, not elsewhere classified, left lower leg: Secondary | ICD-10-CM | POA: Diagnosis not present

## 2021-09-18 DIAGNOSIS — Z96652 Presence of left artificial knee joint: Secondary | ICD-10-CM | POA: Diagnosis not present

## 2021-09-18 DIAGNOSIS — R41840 Attention and concentration deficit: Secondary | ICD-10-CM | POA: Diagnosis not present

## 2021-09-18 DIAGNOSIS — M66862 Spontaneous rupture of other tendons, left lower leg: Secondary | ICD-10-CM | POA: Diagnosis not present

## 2021-09-18 DIAGNOSIS — M1712 Unilateral primary osteoarthritis, left knee: Secondary | ICD-10-CM | POA: Diagnosis not present

## 2021-09-18 DIAGNOSIS — Z471 Aftercare following joint replacement surgery: Secondary | ICD-10-CM | POA: Diagnosis not present

## 2021-09-18 NOTE — Telephone Encounter (Signed)
The norvasc was stopped due to swelling. Bystolic was added to maintain the blood pressure due to a hx of HTN. I saw her and explained it all but due to her memory problems she likely forgot.

## 2021-09-18 NOTE — Telephone Encounter (Signed)
Patient was a walk-in to the clinic and would like to know why her blood pressure medication was changed in the first place. She wants to be sure it was a good idea to make the change.   According to the patient-Last week she was seen by someone and her blood pressure was high. She states she doesn't remember the reading but remembers it was very high which was abnormal for her recently.   Per Francena Hanly, NP  "Essential hypertension -will stop norvasc due to LE edema and add bystolic - nebivolol (BYSTOLIC) 5 MG tablet; Take 1 tablet (5 mg total) by mouth daily.  Dispense: 30 tablet; Refill: 0"  Last seen by Melvyn Novas, NP.

## 2021-09-20 DIAGNOSIS — M1712 Unilateral primary osteoarthritis, left knee: Secondary | ICD-10-CM | POA: Diagnosis not present

## 2021-09-20 DIAGNOSIS — Z96652 Presence of left artificial knee joint: Secondary | ICD-10-CM | POA: Diagnosis not present

## 2021-09-20 DIAGNOSIS — Z471 Aftercare following joint replacement surgery: Secondary | ICD-10-CM | POA: Diagnosis not present

## 2021-09-20 DIAGNOSIS — M66862 Spontaneous rupture of other tendons, left lower leg: Secondary | ICD-10-CM | POA: Diagnosis not present

## 2021-09-20 DIAGNOSIS — M62562 Muscle wasting and atrophy, not elsewhere classified, left lower leg: Secondary | ICD-10-CM | POA: Diagnosis not present

## 2021-09-20 DIAGNOSIS — R41840 Attention and concentration deficit: Secondary | ICD-10-CM | POA: Diagnosis not present

## 2021-09-20 DIAGNOSIS — R278 Other lack of coordination: Secondary | ICD-10-CM | POA: Diagnosis not present

## 2021-09-20 NOTE — Telephone Encounter (Signed)
Discussed with the patient. She will see Nira Conn RN to help her keep an eye on her readings.

## 2021-09-23 ENCOUNTER — Other Ambulatory Visit: Payer: Self-pay | Admitting: Nurse Practitioner

## 2021-09-23 DIAGNOSIS — I10 Essential (primary) hypertension: Secondary | ICD-10-CM

## 2021-09-25 DIAGNOSIS — M66862 Spontaneous rupture of other tendons, left lower leg: Secondary | ICD-10-CM | POA: Diagnosis not present

## 2021-09-25 DIAGNOSIS — M62562 Muscle wasting and atrophy, not elsewhere classified, left lower leg: Secondary | ICD-10-CM | POA: Diagnosis not present

## 2021-09-25 DIAGNOSIS — Z96652 Presence of left artificial knee joint: Secondary | ICD-10-CM | POA: Diagnosis not present

## 2021-09-25 DIAGNOSIS — Z471 Aftercare following joint replacement surgery: Secondary | ICD-10-CM | POA: Diagnosis not present

## 2021-09-25 DIAGNOSIS — M1712 Unilateral primary osteoarthritis, left knee: Secondary | ICD-10-CM | POA: Diagnosis not present

## 2021-09-25 DIAGNOSIS — R41840 Attention and concentration deficit: Secondary | ICD-10-CM | POA: Diagnosis not present

## 2021-09-25 DIAGNOSIS — R278 Other lack of coordination: Secondary | ICD-10-CM | POA: Diagnosis not present

## 2021-09-27 DIAGNOSIS — R278 Other lack of coordination: Secondary | ICD-10-CM | POA: Diagnosis not present

## 2021-09-27 DIAGNOSIS — M1712 Unilateral primary osteoarthritis, left knee: Secondary | ICD-10-CM | POA: Diagnosis not present

## 2021-09-27 DIAGNOSIS — Z96652 Presence of left artificial knee joint: Secondary | ICD-10-CM | POA: Diagnosis not present

## 2021-09-27 DIAGNOSIS — R41840 Attention and concentration deficit: Secondary | ICD-10-CM | POA: Diagnosis not present

## 2021-09-27 DIAGNOSIS — M62562 Muscle wasting and atrophy, not elsewhere classified, left lower leg: Secondary | ICD-10-CM | POA: Diagnosis not present

## 2021-09-27 DIAGNOSIS — Z471 Aftercare following joint replacement surgery: Secondary | ICD-10-CM | POA: Diagnosis not present

## 2021-09-27 DIAGNOSIS — M66862 Spontaneous rupture of other tendons, left lower leg: Secondary | ICD-10-CM | POA: Diagnosis not present

## 2021-10-04 DIAGNOSIS — M1712 Unilateral primary osteoarthritis, left knee: Secondary | ICD-10-CM | POA: Diagnosis not present

## 2021-10-04 DIAGNOSIS — R41840 Attention and concentration deficit: Secondary | ICD-10-CM | POA: Diagnosis not present

## 2021-10-04 DIAGNOSIS — Z96652 Presence of left artificial knee joint: Secondary | ICD-10-CM | POA: Diagnosis not present

## 2021-10-04 DIAGNOSIS — M62562 Muscle wasting and atrophy, not elsewhere classified, left lower leg: Secondary | ICD-10-CM | POA: Diagnosis not present

## 2021-10-04 DIAGNOSIS — M66862 Spontaneous rupture of other tendons, left lower leg: Secondary | ICD-10-CM | POA: Diagnosis not present

## 2021-10-04 DIAGNOSIS — Z471 Aftercare following joint replacement surgery: Secondary | ICD-10-CM | POA: Diagnosis not present

## 2021-10-04 DIAGNOSIS — R278 Other lack of coordination: Secondary | ICD-10-CM | POA: Diagnosis not present

## 2021-10-06 DIAGNOSIS — Z471 Aftercare following joint replacement surgery: Secondary | ICD-10-CM | POA: Diagnosis not present

## 2021-10-06 DIAGNOSIS — R41840 Attention and concentration deficit: Secondary | ICD-10-CM | POA: Diagnosis not present

## 2021-10-06 DIAGNOSIS — M66862 Spontaneous rupture of other tendons, left lower leg: Secondary | ICD-10-CM | POA: Diagnosis not present

## 2021-10-06 DIAGNOSIS — M1712 Unilateral primary osteoarthritis, left knee: Secondary | ICD-10-CM | POA: Diagnosis not present

## 2021-10-06 DIAGNOSIS — R278 Other lack of coordination: Secondary | ICD-10-CM | POA: Diagnosis not present

## 2021-10-06 DIAGNOSIS — Z96652 Presence of left artificial knee joint: Secondary | ICD-10-CM | POA: Diagnosis not present

## 2021-10-06 DIAGNOSIS — M62562 Muscle wasting and atrophy, not elsewhere classified, left lower leg: Secondary | ICD-10-CM | POA: Diagnosis not present

## 2021-10-09 DIAGNOSIS — Z471 Aftercare following joint replacement surgery: Secondary | ICD-10-CM | POA: Diagnosis not present

## 2021-10-09 DIAGNOSIS — M1712 Unilateral primary osteoarthritis, left knee: Secondary | ICD-10-CM | POA: Diagnosis not present

## 2021-10-09 DIAGNOSIS — M62562 Muscle wasting and atrophy, not elsewhere classified, left lower leg: Secondary | ICD-10-CM | POA: Diagnosis not present

## 2021-10-09 DIAGNOSIS — Z96652 Presence of left artificial knee joint: Secondary | ICD-10-CM | POA: Diagnosis not present

## 2021-10-09 DIAGNOSIS — M66862 Spontaneous rupture of other tendons, left lower leg: Secondary | ICD-10-CM | POA: Diagnosis not present

## 2021-10-09 DIAGNOSIS — R278 Other lack of coordination: Secondary | ICD-10-CM | POA: Diagnosis not present

## 2021-10-09 DIAGNOSIS — R41840 Attention and concentration deficit: Secondary | ICD-10-CM | POA: Diagnosis not present

## 2021-10-11 DIAGNOSIS — M66862 Spontaneous rupture of other tendons, left lower leg: Secondary | ICD-10-CM | POA: Diagnosis not present

## 2021-10-11 DIAGNOSIS — R41840 Attention and concentration deficit: Secondary | ICD-10-CM | POA: Diagnosis not present

## 2021-10-11 DIAGNOSIS — Z471 Aftercare following joint replacement surgery: Secondary | ICD-10-CM | POA: Diagnosis not present

## 2021-10-11 DIAGNOSIS — M1712 Unilateral primary osteoarthritis, left knee: Secondary | ICD-10-CM | POA: Diagnosis not present

## 2021-10-11 DIAGNOSIS — R278 Other lack of coordination: Secondary | ICD-10-CM | POA: Diagnosis not present

## 2021-10-11 DIAGNOSIS — M62562 Muscle wasting and atrophy, not elsewhere classified, left lower leg: Secondary | ICD-10-CM | POA: Diagnosis not present

## 2021-10-11 DIAGNOSIS — Z96652 Presence of left artificial knee joint: Secondary | ICD-10-CM | POA: Diagnosis not present

## 2021-10-16 DIAGNOSIS — M1712 Unilateral primary osteoarthritis, left knee: Secondary | ICD-10-CM | POA: Diagnosis not present

## 2021-10-16 DIAGNOSIS — R278 Other lack of coordination: Secondary | ICD-10-CM | POA: Diagnosis not present

## 2021-10-16 DIAGNOSIS — Z471 Aftercare following joint replacement surgery: Secondary | ICD-10-CM | POA: Diagnosis not present

## 2021-10-16 DIAGNOSIS — Z96652 Presence of left artificial knee joint: Secondary | ICD-10-CM | POA: Diagnosis not present

## 2021-10-16 DIAGNOSIS — R41840 Attention and concentration deficit: Secondary | ICD-10-CM | POA: Diagnosis not present

## 2021-10-16 DIAGNOSIS — M66862 Spontaneous rupture of other tendons, left lower leg: Secondary | ICD-10-CM | POA: Diagnosis not present

## 2021-10-16 DIAGNOSIS — M62562 Muscle wasting and atrophy, not elsewhere classified, left lower leg: Secondary | ICD-10-CM | POA: Diagnosis not present

## 2021-10-18 ENCOUNTER — Other Ambulatory Visit: Payer: Self-pay | Admitting: Internal Medicine

## 2021-10-18 DIAGNOSIS — Z471 Aftercare following joint replacement surgery: Secondary | ICD-10-CM | POA: Diagnosis not present

## 2021-10-18 DIAGNOSIS — M62562 Muscle wasting and atrophy, not elsewhere classified, left lower leg: Secondary | ICD-10-CM | POA: Diagnosis not present

## 2021-10-18 DIAGNOSIS — R41840 Attention and concentration deficit: Secondary | ICD-10-CM | POA: Diagnosis not present

## 2021-10-18 DIAGNOSIS — M66862 Spontaneous rupture of other tendons, left lower leg: Secondary | ICD-10-CM | POA: Diagnosis not present

## 2021-10-18 DIAGNOSIS — R278 Other lack of coordination: Secondary | ICD-10-CM | POA: Diagnosis not present

## 2021-10-18 DIAGNOSIS — Z96652 Presence of left artificial knee joint: Secondary | ICD-10-CM | POA: Diagnosis not present

## 2021-10-18 DIAGNOSIS — M1712 Unilateral primary osteoarthritis, left knee: Secondary | ICD-10-CM | POA: Diagnosis not present

## 2021-10-23 DIAGNOSIS — M66862 Spontaneous rupture of other tendons, left lower leg: Secondary | ICD-10-CM | POA: Diagnosis not present

## 2021-10-23 DIAGNOSIS — Z471 Aftercare following joint replacement surgery: Secondary | ICD-10-CM | POA: Diagnosis not present

## 2021-10-23 DIAGNOSIS — Z96652 Presence of left artificial knee joint: Secondary | ICD-10-CM | POA: Diagnosis not present

## 2021-10-23 DIAGNOSIS — R278 Other lack of coordination: Secondary | ICD-10-CM | POA: Diagnosis not present

## 2021-10-23 DIAGNOSIS — M1712 Unilateral primary osteoarthritis, left knee: Secondary | ICD-10-CM | POA: Diagnosis not present

## 2021-10-23 DIAGNOSIS — M62562 Muscle wasting and atrophy, not elsewhere classified, left lower leg: Secondary | ICD-10-CM | POA: Diagnosis not present

## 2021-10-23 DIAGNOSIS — R41840 Attention and concentration deficit: Secondary | ICD-10-CM | POA: Diagnosis not present

## 2021-10-25 DIAGNOSIS — M66862 Spontaneous rupture of other tendons, left lower leg: Secondary | ICD-10-CM | POA: Diagnosis not present

## 2021-10-25 DIAGNOSIS — M62562 Muscle wasting and atrophy, not elsewhere classified, left lower leg: Secondary | ICD-10-CM | POA: Diagnosis not present

## 2021-10-25 DIAGNOSIS — R278 Other lack of coordination: Secondary | ICD-10-CM | POA: Diagnosis not present

## 2021-10-25 DIAGNOSIS — Z471 Aftercare following joint replacement surgery: Secondary | ICD-10-CM | POA: Diagnosis not present

## 2021-10-25 DIAGNOSIS — M1712 Unilateral primary osteoarthritis, left knee: Secondary | ICD-10-CM | POA: Diagnosis not present

## 2021-10-25 DIAGNOSIS — R41840 Attention and concentration deficit: Secondary | ICD-10-CM | POA: Diagnosis not present

## 2021-10-25 DIAGNOSIS — Z96652 Presence of left artificial knee joint: Secondary | ICD-10-CM | POA: Diagnosis not present

## 2021-11-01 ENCOUNTER — Encounter (INDEPENDENT_AMBULATORY_CARE_PROVIDER_SITE_OTHER): Payer: Medicare Other | Admitting: Ophthalmology

## 2021-11-01 DIAGNOSIS — H43813 Vitreous degeneration, bilateral: Secondary | ICD-10-CM | POA: Diagnosis not present

## 2021-11-01 DIAGNOSIS — H35033 Hypertensive retinopathy, bilateral: Secondary | ICD-10-CM

## 2021-11-01 DIAGNOSIS — H353211 Exudative age-related macular degeneration, right eye, with active choroidal neovascularization: Secondary | ICD-10-CM | POA: Diagnosis not present

## 2021-11-01 DIAGNOSIS — H353122 Nonexudative age-related macular degeneration, left eye, intermediate dry stage: Secondary | ICD-10-CM | POA: Diagnosis not present

## 2021-11-01 DIAGNOSIS — I10 Essential (primary) hypertension: Secondary | ICD-10-CM

## 2021-11-02 DIAGNOSIS — D509 Iron deficiency anemia, unspecified: Secondary | ICD-10-CM | POA: Diagnosis not present

## 2021-11-02 LAB — BASIC METABOLIC PANEL
BUN: 18 (ref 4–21)
CO2: 21 (ref 13–22)
Chloride: 104 (ref 99–108)
Creatinine: 0.7 (ref 0.5–1.1)
Glucose: 102
Potassium: 4 mEq/L (ref 3.5–5.1)
Sodium: 141 (ref 137–147)

## 2021-11-02 LAB — COMPREHENSIVE METABOLIC PANEL
Calcium: 9 (ref 8.7–10.7)
eGFR: 85

## 2021-11-03 ENCOUNTER — Encounter: Payer: Self-pay | Admitting: Internal Medicine

## 2021-11-03 DIAGNOSIS — Z471 Aftercare following joint replacement surgery: Secondary | ICD-10-CM | POA: Diagnosis not present

## 2021-11-03 DIAGNOSIS — M1712 Unilateral primary osteoarthritis, left knee: Secondary | ICD-10-CM | POA: Diagnosis not present

## 2021-11-03 DIAGNOSIS — R41840 Attention and concentration deficit: Secondary | ICD-10-CM | POA: Diagnosis not present

## 2021-11-03 DIAGNOSIS — M66862 Spontaneous rupture of other tendons, left lower leg: Secondary | ICD-10-CM | POA: Diagnosis not present

## 2021-11-03 DIAGNOSIS — M62562 Muscle wasting and atrophy, not elsewhere classified, left lower leg: Secondary | ICD-10-CM | POA: Diagnosis not present

## 2021-11-03 DIAGNOSIS — R278 Other lack of coordination: Secondary | ICD-10-CM | POA: Diagnosis not present

## 2021-11-03 DIAGNOSIS — Z96652 Presence of left artificial knee joint: Secondary | ICD-10-CM | POA: Diagnosis not present

## 2021-11-06 DIAGNOSIS — R278 Other lack of coordination: Secondary | ICD-10-CM | POA: Diagnosis not present

## 2021-11-06 DIAGNOSIS — M1712 Unilateral primary osteoarthritis, left knee: Secondary | ICD-10-CM | POA: Diagnosis not present

## 2021-11-06 DIAGNOSIS — Z96652 Presence of left artificial knee joint: Secondary | ICD-10-CM | POA: Diagnosis not present

## 2021-11-06 DIAGNOSIS — Z471 Aftercare following joint replacement surgery: Secondary | ICD-10-CM | POA: Diagnosis not present

## 2021-11-06 DIAGNOSIS — R41840 Attention and concentration deficit: Secondary | ICD-10-CM | POA: Diagnosis not present

## 2021-11-06 DIAGNOSIS — M66862 Spontaneous rupture of other tendons, left lower leg: Secondary | ICD-10-CM | POA: Diagnosis not present

## 2021-11-06 DIAGNOSIS — M62562 Muscle wasting and atrophy, not elsewhere classified, left lower leg: Secondary | ICD-10-CM | POA: Diagnosis not present

## 2021-11-08 ENCOUNTER — Encounter: Payer: Medicare Other | Admitting: Internal Medicine

## 2021-11-09 NOTE — Progress Notes (Signed)
Patient was not seen, canceled appointment

## 2021-11-10 DIAGNOSIS — Z471 Aftercare following joint replacement surgery: Secondary | ICD-10-CM | POA: Diagnosis not present

## 2021-11-10 DIAGNOSIS — R41840 Attention and concentration deficit: Secondary | ICD-10-CM | POA: Diagnosis not present

## 2021-11-10 DIAGNOSIS — M62562 Muscle wasting and atrophy, not elsewhere classified, left lower leg: Secondary | ICD-10-CM | POA: Diagnosis not present

## 2021-11-10 DIAGNOSIS — Z96652 Presence of left artificial knee joint: Secondary | ICD-10-CM | POA: Diagnosis not present

## 2021-11-10 DIAGNOSIS — M66862 Spontaneous rupture of other tendons, left lower leg: Secondary | ICD-10-CM | POA: Diagnosis not present

## 2021-11-10 DIAGNOSIS — M1712 Unilateral primary osteoarthritis, left knee: Secondary | ICD-10-CM | POA: Diagnosis not present

## 2021-11-10 DIAGNOSIS — R278 Other lack of coordination: Secondary | ICD-10-CM | POA: Diagnosis not present

## 2021-11-13 ENCOUNTER — Encounter: Payer: Self-pay | Admitting: Internal Medicine

## 2021-11-13 DIAGNOSIS — Z96652 Presence of left artificial knee joint: Secondary | ICD-10-CM | POA: Diagnosis not present

## 2021-11-13 DIAGNOSIS — R41840 Attention and concentration deficit: Secondary | ICD-10-CM | POA: Diagnosis not present

## 2021-11-13 DIAGNOSIS — M1712 Unilateral primary osteoarthritis, left knee: Secondary | ICD-10-CM | POA: Diagnosis not present

## 2021-11-13 DIAGNOSIS — M66862 Spontaneous rupture of other tendons, left lower leg: Secondary | ICD-10-CM | POA: Diagnosis not present

## 2021-11-13 DIAGNOSIS — R278 Other lack of coordination: Secondary | ICD-10-CM | POA: Diagnosis not present

## 2021-11-13 DIAGNOSIS — Z471 Aftercare following joint replacement surgery: Secondary | ICD-10-CM | POA: Diagnosis not present

## 2021-11-13 DIAGNOSIS — M62562 Muscle wasting and atrophy, not elsewhere classified, left lower leg: Secondary | ICD-10-CM | POA: Diagnosis not present

## 2021-11-14 DIAGNOSIS — Z96652 Presence of left artificial knee joint: Secondary | ICD-10-CM | POA: Diagnosis not present

## 2021-11-15 ENCOUNTER — Encounter: Payer: Self-pay | Admitting: Internal Medicine

## 2021-11-15 ENCOUNTER — Non-Acute Institutional Stay: Payer: Medicare Other | Admitting: Internal Medicine

## 2021-11-15 VITALS — BP 136/82 | HR 65 | Temp 97.4°F | Ht 64.0 in | Wt 134.0 lb

## 2021-11-15 DIAGNOSIS — M1712 Unilateral primary osteoarthritis, left knee: Secondary | ICD-10-CM | POA: Diagnosis not present

## 2021-11-15 DIAGNOSIS — D509 Iron deficiency anemia, unspecified: Secondary | ICD-10-CM | POA: Diagnosis not present

## 2021-11-15 DIAGNOSIS — R600 Localized edema: Secondary | ICD-10-CM | POA: Diagnosis not present

## 2021-11-15 DIAGNOSIS — E538 Deficiency of other specified B group vitamins: Secondary | ICD-10-CM | POA: Diagnosis not present

## 2021-11-15 DIAGNOSIS — Z96652 Presence of left artificial knee joint: Secondary | ICD-10-CM | POA: Diagnosis not present

## 2021-11-15 DIAGNOSIS — R350 Frequency of micturition: Secondary | ICD-10-CM

## 2021-11-15 DIAGNOSIS — M66862 Spontaneous rupture of other tendons, left lower leg: Secondary | ICD-10-CM | POA: Diagnosis not present

## 2021-11-15 DIAGNOSIS — R413 Other amnesia: Secondary | ICD-10-CM | POA: Diagnosis not present

## 2021-11-15 DIAGNOSIS — I10 Essential (primary) hypertension: Secondary | ICD-10-CM

## 2021-11-15 DIAGNOSIS — Z471 Aftercare following joint replacement surgery: Secondary | ICD-10-CM | POA: Diagnosis not present

## 2021-11-15 DIAGNOSIS — M62562 Muscle wasting and atrophy, not elsewhere classified, left lower leg: Secondary | ICD-10-CM | POA: Diagnosis not present

## 2021-11-15 DIAGNOSIS — R41840 Attention and concentration deficit: Secondary | ICD-10-CM | POA: Diagnosis not present

## 2021-11-15 DIAGNOSIS — R278 Other lack of coordination: Secondary | ICD-10-CM | POA: Diagnosis not present

## 2021-11-16 NOTE — Progress Notes (Signed)
Location:  [place] of Service:  Clinic (12)  Provider:   Code Status:  Goals of Care:     05/03/2021   11:32 AM  Advanced Directives  Does Patient Have a Medical Advance Directive? Yes  Type of Paramedic of [place];Living will;Out of facility DNR (pink MOST or yellow form)  Does patient want to make changes to medical advance directive? No - Patient declined  Copy of [place] in Chart? Yes - validated most recent copy scanned in chart (See row information)  Pre-existing out of facility DNR order (yellow form or pink MOST form) Yellow form placed in chart (order not valid for inpatient use)     Chief Complaint  Patient presents with   Medical Management of Chronic Issues    Medical Management of Chronic Issues. 3 Month Follow up. Discuss need for Dexascan and Flu or postpone if patient refuses.     HPI: Patient is a 87 y.o. female seen today for medical management of chronic diseases.    Patient came for follow-up.  [place]  Patient has a history of left TKA, history of hyponatremia Patient also has history of anemia multifactorial due to iron deficiency and B12 follows with hematology and gets iron infusions as needed Also has history of hypertension and arthritis Urinary incontinence Cognitive impairment  Now using power chair. Continue sot have issues with Ambulation Uses walker inside the apartment Husband mostly helps Did not have any acute complaints. Past Medical History:  Diagnosis Date   Dementia ([place])    Gait abnormality 06/20/2020   Hypertension    Hyponatremia    Iron deficiency anemia    Major depression    OA (osteoarthritis)    Pre-diabetes    Vitamin B 12 deficiency     Past Surgical History:  Procedure Laterality Date   ABDOMINAL HYSTERECTOMY     COLONOSCOPY     HEMANGIOMA EXCISION     JOINT REPLACEMENT     PATELLAR TENDON REPAIR Left  01/16/2021   Procedure: Left patellar tendon reconstruction;  Surgeon: Gaynelle Arabian, MD;  Location: [facility];  Service: Orthopedics;  Laterality: Left;   TONSILLECTOMY     TOTAL HIP ARTHROPLASTY Right 02/18/2019   Procedure: TOTAL HIP ARTHROPLASTY ANTERIOR APPROACH;  Surgeon: Gaynelle Arabian, MD;  Location: [facility];  Service: Orthopedics;  Laterality: Right;  156mn   TOTAL KNEE ARTHROPLASTY Right 05/27/2012   Procedure: TOTAL KNEE ARTHROPLASTY;  Surgeon: GMeredith Pel MD;  Location: [facility]  Service: Orthopedics;  Laterality: Right;  Right Total Knee Arthroplasty   TOTAL KNEE ARTHROPLASTY Left 12/12/2020   Procedure: TOTAL KNEE ARTHROPLASTY;  Surgeon: AGaynelle Arabian MD;  Location: [facility];  Service: Orthopedics;  Laterality: Left;   TOTAL KNEE REVISION Right 08/06/2018   Procedure: TOTAL KNEE REVISION;  Surgeon: AGaynelle Arabian MD;  Location: [facility];  Service: Orthopedics;  Laterality: Right;   TUBAL LIGATION      No Known Allergies  Outpatient Encounter Medications as of 11/15/2021  Medication Sig   acetaminophen (TYLENOL) 500 MG tablet Take 500 mg by mouth every 6 (six) hours as needed for moderate pain.   aspirin EC 81 MG tablet Take 81 mg by mouth daily.   cholecalciferol (VITAMIN D3) 25 MCG (1000 UT) tablet Take 1,000 Units by mouth daily.   COMBIGAN 0.2-0.5 % ophthalmic solution Place 1 drop into the right eye in the morning and at bedtime.  Cyanocobalamin (B-12) 2500 MCG TABS Take 1 tablet by mouth daily.   dorzolamide (TRUSOPT) 2 % ophthalmic solution Place 1 drop into the right eye 2 (two) times daily.    ferrous sulfate 325 (65 FE) MG tablet Take 325 mg by mouth 3 (three) times a week.   latanoprost (XALATAN) 0.005 % ophthalmic solution Place 1 drop into both eyes at bedtime.    Multiple Vitamins-Minerals (PRESERVISION AREDS 2 PO) Take 1 capsule by mouth in the morning and at bedtime.   nebivolol (BYSTOLIC) 5 MG tablet TAKE 1 TABLET (5 MG TOTAL) BY MOUTH DAILY.   olmesartan  (BENICAR) 40 MG tablet TAKE 1 TABLET DAILY   polyethylene glycol (MIRALAX / GLYCOLAX) 17 g packet Take 17 g by mouth daily. In the morning between 8-11 am   [DISCONTINUED] methocarbamol (ROBAXIN) 500 MG tablet Take 0.5 tablets (250 mg total) by mouth at bedtime as needed for muscle spasms.   No facility-administered encounter medications on file as of 11/15/2021.    Review of Systems:  Review of Systems  Constitutional:  Negative for activity change and appetite change.  HENT: Negative.    Respiratory:  Negative for cough and shortness of breath.   Cardiovascular:  Negative for leg swelling.  Gastrointestinal:  Negative for constipation.  Genitourinary:  Positive for frequency.  Musculoskeletal:  Positive for arthralgias, gait problem and myalgias.  Skin: Negative.   Neurological:  Negative for dizziness and weakness.  Psychiatric/Behavioral:  Positive for confusion. Negative for dysphoric mood and sleep disturbance.     Health Maintenance  Topic Date Due   DEXA SCAN  Never done   INFLUENZA VACCINE  09/26/2021   COVID-19 Vaccine (5 - Mixed Product risk series) 01/17/2022 (Originally 02/03/2021)   TETANUS/TDAP  08/01/2031   Pneumonia Vaccine 56+ Years old  Completed   Zoster Vaccines- Shingrix  Completed   HPV VACCINES  Aged Out    Physical Exam: Vitals:   11/15/21 0843  BP: 136/82  Pulse: 65  Temp: (!) 97.4 F (36.3 C)  SpO2: 97%  Weight: 134 lb (60.8 kg)  Height: '5\' 4"'$  (1.626 m)   Body mass index is 23 kg/m. Physical Exam Vitals reviewed.  Constitutional:      Appearance: Normal appearance.  HENT:     Head: Normocephalic.     Nose: Nose normal.     Mouth/Throat:     Mouth: Mucous membranes are moist.     Pharynx: Oropharynx is clear.  Eyes:     Pupils: Pupils are equal, round, and reactive to light.  Cardiovascular:     Rate and Rhythm: Normal rate and regular rhythm.     Pulses: Normal pulses.     Heart sounds: Normal heart sounds. No murmur  heard. Pulmonary:     Effort: Pulmonary effort is normal.     Breath sounds: Normal breath sounds.  Abdominal:     General: Abdomen is flat. Bowel sounds are normal.     Palpations: Abdomen is soft.  Musculoskeletal:        General: No swelling.     Cervical back: Neck supple.     Comments: Mild Swelling in Left knee  Skin:    General: Skin is warm.  Neurological:     General: No focal deficit present.     Mental Status: She is alert.  Psychiatric:        Mood and Affect: Mood normal.        Thought Content: Thought content normal.  Labs reviewed: Basic Metabolic Panel: Recent Labs    12/21/20 1721 12/21/20 2050 12/22/20 1218 01/17/21 0310 01/18/21 0320 01/26/21 0000 03/27/21 1454 07/27/21 0000 11/02/21 0000  NA 125*  --    < > 136 132*   < > 139 142 141  K 4.6  --    < > 3.5 3.3*   < > 4.4 3.9 4.0  CL 94*  --    < > 103 100   < > 104 106 104  CO2 24  --    < > 28 27   < > 29 25* 21  GLUCOSE 119*  --    < > 90 103*  --  94  --   --   BUN 13  --    < > 9 10   < > '15 19 18  '$ CREATININE 0.85  --    < > 0.50 0.51   < > 0.81 0.6 0.7  CALCIUM 8.5*  --    < > 8.2* 8.2*   < > 9.1 8.8 9.0  MG 1.8  --   --   --   --   --   --   --   --   PHOS 2.2*  --   --   --   --   --   --   --   --   TSH  --  1.488  --   --   --   --   --  1.54  --    < > = values in this interval not displayed.   Liver Function Tests: Recent Labs    12/02/20 1443 12/21/20 1721 12/29/20 0000 03/27/21 1454 07/27/21 0000  AST 14* '21 16 17 13  '$ ALT '12 27 15 19 9  '$ ALKPHOS 66 122 130* 60 61  BILITOT 0.3 0.5  --  0.3  --   PROT 7.1 6.9  --  6.9  --   ALBUMIN 3.4* 3.4* 3.7 4.0 4.2   No results for input(s): "LIPASE", "AMYLASE" in the last 8760 hours. No results for input(s): "AMMONIA" in the last 8760 hours. CBC: Recent Labs    11/23/20 1434 12/02/20 1443 12/21/20 1721 12/29/20 0000 01/17/21 0310 01/18/21 0320 01/26/21 0000 03/27/21 1454 07/27/21 0000  WBC 6.3   < > 11.6*   < > 6.5 6.5  5.1 4.2 3.7  NEUTROABS 3.9  --  9.2*  --   --   --   --  2.2  --   HGB 9.8*   < > 10.4*   < > 9.4* 8.3* 9.4* 11.3* 12.1  HCT 29.6*   < > 31.9*   < > 28.4* 25.6* 30* 34.4* 36  MCV 92.5   < > 94.4  --  97.3 97.0  --  95.8  --   PLT 347   < > 349   < > 228 214 327 229 241   < > = values in this interval not displayed.   Lipid Panel: Recent Labs    07/27/21 0000  CHOL 122  HDL 71*  LDLCALC 40  TRIG 54   No results found for: "HGBA1C"  Procedures since last visit: No results found.  Assessment/Plan 1. Essential hypertension Norvasc was changed to Bystolic due to Lower Extremities edema Her BP is good today Also oN Benicar  2. Lower leg edema Mostly resolved Not on Lasix anymore  3. Iron deficiency anemia, unspecified iron deficiency anemia type Per Dr Irene Limbo note She  had Occult tested by her Previous PCP was negative Hgb stable on Iron and B 12 I discussed GI work-up with her and her husband on last visit and they are not interested right now Will repeat CBC before next visit  They do not seem to be interested right now.  Hematology said to follow labs for now.  They are going to see her as needed.    4. Urinary frequency Refused Any Treatment  Wants to continue with Pull ups  5. Status post total left knee replacement Following with Dr Maureen Ralphs Continues to have ambulation issues Using power chair now  6. B12 deficiency On Supplement  7. Short-term memory loss Repeat MMSE next visit   Labs/tests ordered:  CBC,CMP,LIpid Next appt:  02/13/2022

## 2021-11-17 DIAGNOSIS — R262 Difficulty in walking, not elsewhere classified: Secondary | ICD-10-CM | POA: Diagnosis not present

## 2021-11-17 DIAGNOSIS — M25662 Stiffness of left knee, not elsewhere classified: Secondary | ICD-10-CM | POA: Diagnosis not present

## 2021-11-17 DIAGNOSIS — R278 Other lack of coordination: Secondary | ICD-10-CM | POA: Diagnosis not present

## 2021-11-17 DIAGNOSIS — M62562 Muscle wasting and atrophy, not elsewhere classified, left lower leg: Secondary | ICD-10-CM | POA: Diagnosis not present

## 2021-11-17 DIAGNOSIS — M66862 Spontaneous rupture of other tendons, left lower leg: Secondary | ICD-10-CM | POA: Diagnosis not present

## 2021-11-17 DIAGNOSIS — S76112D Strain of left quadriceps muscle, fascia and tendon, subsequent encounter: Secondary | ICD-10-CM | POA: Diagnosis not present

## 2021-11-17 DIAGNOSIS — M1712 Unilateral primary osteoarthritis, left knee: Secondary | ICD-10-CM | POA: Diagnosis not present

## 2021-11-17 DIAGNOSIS — Z471 Aftercare following joint replacement surgery: Secondary | ICD-10-CM | POA: Diagnosis not present

## 2021-11-17 DIAGNOSIS — R531 Weakness: Secondary | ICD-10-CM | POA: Diagnosis not present

## 2021-11-17 DIAGNOSIS — Z96652 Presence of left artificial knee joint: Secondary | ICD-10-CM | POA: Diagnosis not present

## 2021-11-17 DIAGNOSIS — R41840 Attention and concentration deficit: Secondary | ICD-10-CM | POA: Diagnosis not present

## 2021-11-22 DIAGNOSIS — M62562 Muscle wasting and atrophy, not elsewhere classified, left lower leg: Secondary | ICD-10-CM | POA: Diagnosis not present

## 2021-11-22 DIAGNOSIS — Z471 Aftercare following joint replacement surgery: Secondary | ICD-10-CM | POA: Diagnosis not present

## 2021-11-22 DIAGNOSIS — M1712 Unilateral primary osteoarthritis, left knee: Secondary | ICD-10-CM | POA: Diagnosis not present

## 2021-11-22 DIAGNOSIS — R41840 Attention and concentration deficit: Secondary | ICD-10-CM | POA: Diagnosis not present

## 2021-11-22 DIAGNOSIS — Z96652 Presence of left artificial knee joint: Secondary | ICD-10-CM | POA: Diagnosis not present

## 2021-11-22 DIAGNOSIS — M66862 Spontaneous rupture of other tendons, left lower leg: Secondary | ICD-10-CM | POA: Diagnosis not present

## 2021-11-22 DIAGNOSIS — R278 Other lack of coordination: Secondary | ICD-10-CM | POA: Diagnosis not present

## 2021-11-24 DIAGNOSIS — R41840 Attention and concentration deficit: Secondary | ICD-10-CM | POA: Diagnosis not present

## 2021-11-24 DIAGNOSIS — Z471 Aftercare following joint replacement surgery: Secondary | ICD-10-CM | POA: Diagnosis not present

## 2021-11-24 DIAGNOSIS — M62562 Muscle wasting and atrophy, not elsewhere classified, left lower leg: Secondary | ICD-10-CM | POA: Diagnosis not present

## 2021-11-24 DIAGNOSIS — S76112D Strain of left quadriceps muscle, fascia and tendon, subsequent encounter: Secondary | ICD-10-CM | POA: Diagnosis not present

## 2021-11-24 DIAGNOSIS — M1712 Unilateral primary osteoarthritis, left knee: Secondary | ICD-10-CM | POA: Diagnosis not present

## 2021-11-24 DIAGNOSIS — R531 Weakness: Secondary | ICD-10-CM | POA: Diagnosis not present

## 2021-11-24 DIAGNOSIS — R262 Difficulty in walking, not elsewhere classified: Secondary | ICD-10-CM | POA: Diagnosis not present

## 2021-11-24 DIAGNOSIS — Z96652 Presence of left artificial knee joint: Secondary | ICD-10-CM | POA: Diagnosis not present

## 2021-11-24 DIAGNOSIS — M66862 Spontaneous rupture of other tendons, left lower leg: Secondary | ICD-10-CM | POA: Diagnosis not present

## 2021-11-24 DIAGNOSIS — R278 Other lack of coordination: Secondary | ICD-10-CM | POA: Diagnosis not present

## 2021-11-24 DIAGNOSIS — M25662 Stiffness of left knee, not elsewhere classified: Secondary | ICD-10-CM | POA: Diagnosis not present

## 2021-11-27 ENCOUNTER — Encounter: Payer: Self-pay | Admitting: Hematology

## 2021-11-27 ENCOUNTER — Other Ambulatory Visit (HOSPITAL_BASED_OUTPATIENT_CLINIC_OR_DEPARTMENT_OTHER): Payer: Self-pay

## 2021-11-27 DIAGNOSIS — R278 Other lack of coordination: Secondary | ICD-10-CM | POA: Diagnosis not present

## 2021-11-27 DIAGNOSIS — R41840 Attention and concentration deficit: Secondary | ICD-10-CM | POA: Diagnosis not present

## 2021-11-27 DIAGNOSIS — M1712 Unilateral primary osteoarthritis, left knee: Secondary | ICD-10-CM | POA: Diagnosis not present

## 2021-11-27 DIAGNOSIS — M66862 Spontaneous rupture of other tendons, left lower leg: Secondary | ICD-10-CM | POA: Diagnosis not present

## 2021-11-27 DIAGNOSIS — Z471 Aftercare following joint replacement surgery: Secondary | ICD-10-CM | POA: Diagnosis not present

## 2021-11-27 DIAGNOSIS — M62562 Muscle wasting and atrophy, not elsewhere classified, left lower leg: Secondary | ICD-10-CM | POA: Diagnosis not present

## 2021-11-27 DIAGNOSIS — Z96652 Presence of left artificial knee joint: Secondary | ICD-10-CM | POA: Diagnosis not present

## 2021-11-27 MED ORDER — FLUAD QUADRIVALENT 0.5 ML IM PRSY
PREFILLED_SYRINGE | INTRAMUSCULAR | 0 refills | Status: AC
Start: 2021-11-27 — End: ?
  Filled 2021-11-27: qty 0.5, 1d supply, fill #0

## 2021-11-30 DIAGNOSIS — R262 Difficulty in walking, not elsewhere classified: Secondary | ICD-10-CM | POA: Diagnosis not present

## 2021-11-30 DIAGNOSIS — R531 Weakness: Secondary | ICD-10-CM | POA: Diagnosis not present

## 2021-11-30 DIAGNOSIS — S76112D Strain of left quadriceps muscle, fascia and tendon, subsequent encounter: Secondary | ICD-10-CM | POA: Diagnosis not present

## 2021-11-30 DIAGNOSIS — M25662 Stiffness of left knee, not elsewhere classified: Secondary | ICD-10-CM | POA: Diagnosis not present

## 2021-12-14 ENCOUNTER — Other Ambulatory Visit: Payer: Self-pay | Admitting: Internal Medicine

## 2021-12-20 DIAGNOSIS — H401131 Primary open-angle glaucoma, bilateral, mild stage: Secondary | ICD-10-CM | POA: Diagnosis not present

## 2021-12-20 DIAGNOSIS — H353121 Nonexudative age-related macular degeneration, left eye, early dry stage: Secondary | ICD-10-CM | POA: Diagnosis not present

## 2021-12-20 DIAGNOSIS — H3530 Unspecified macular degeneration: Secondary | ICD-10-CM | POA: Diagnosis not present

## 2021-12-20 DIAGNOSIS — H26491 Other secondary cataract, right eye: Secondary | ICD-10-CM | POA: Diagnosis not present

## 2021-12-20 DIAGNOSIS — Z961 Presence of intraocular lens: Secondary | ICD-10-CM | POA: Diagnosis not present

## 2021-12-27 ENCOUNTER — Encounter (INDEPENDENT_AMBULATORY_CARE_PROVIDER_SITE_OTHER): Payer: Medicare Other | Admitting: Ophthalmology

## 2021-12-27 DIAGNOSIS — H43813 Vitreous degeneration, bilateral: Secondary | ICD-10-CM

## 2021-12-27 DIAGNOSIS — H353211 Exudative age-related macular degeneration, right eye, with active choroidal neovascularization: Secondary | ICD-10-CM | POA: Diagnosis not present

## 2021-12-27 DIAGNOSIS — H353122 Nonexudative age-related macular degeneration, left eye, intermediate dry stage: Secondary | ICD-10-CM

## 2021-12-27 DIAGNOSIS — I10 Essential (primary) hypertension: Secondary | ICD-10-CM | POA: Diagnosis not present

## 2021-12-27 DIAGNOSIS — H35033 Hypertensive retinopathy, bilateral: Secondary | ICD-10-CM | POA: Diagnosis not present

## 2022-01-04 DIAGNOSIS — Z96653 Presence of artificial knee joint, bilateral: Secondary | ICD-10-CM | POA: Diagnosis not present

## 2022-01-11 DIAGNOSIS — R531 Weakness: Secondary | ICD-10-CM | POA: Diagnosis not present

## 2022-01-11 DIAGNOSIS — R262 Difficulty in walking, not elsewhere classified: Secondary | ICD-10-CM | POA: Diagnosis not present

## 2022-01-11 DIAGNOSIS — S76112D Strain of left quadriceps muscle, fascia and tendon, subsequent encounter: Secondary | ICD-10-CM | POA: Diagnosis not present

## 2022-01-11 DIAGNOSIS — M25662 Stiffness of left knee, not elsewhere classified: Secondary | ICD-10-CM | POA: Diagnosis not present

## 2022-01-25 DIAGNOSIS — R531 Weakness: Secondary | ICD-10-CM | POA: Diagnosis not present

## 2022-01-25 DIAGNOSIS — M25662 Stiffness of left knee, not elsewhere classified: Secondary | ICD-10-CM | POA: Diagnosis not present

## 2022-01-25 DIAGNOSIS — R262 Difficulty in walking, not elsewhere classified: Secondary | ICD-10-CM | POA: Diagnosis not present

## 2022-01-25 DIAGNOSIS — S76112D Strain of left quadriceps muscle, fascia and tendon, subsequent encounter: Secondary | ICD-10-CM | POA: Diagnosis not present

## 2022-02-13 ENCOUNTER — Encounter: Payer: Self-pay | Admitting: Internal Medicine

## 2022-02-13 ENCOUNTER — Non-Acute Institutional Stay: Payer: Medicare Other | Admitting: Internal Medicine

## 2022-02-13 VITALS — BP 128/84 | HR 74 | Temp 97.8°F | Resp 17 | Ht 64.0 in | Wt 134.0 lb

## 2022-02-13 DIAGNOSIS — D509 Iron deficiency anemia, unspecified: Secondary | ICD-10-CM

## 2022-02-13 DIAGNOSIS — E538 Deficiency of other specified B group vitamins: Secondary | ICD-10-CM | POA: Diagnosis not present

## 2022-02-13 DIAGNOSIS — R350 Frequency of micturition: Secondary | ICD-10-CM | POA: Diagnosis not present

## 2022-02-13 DIAGNOSIS — I10 Essential (primary) hypertension: Secondary | ICD-10-CM

## 2022-02-13 DIAGNOSIS — R413 Other amnesia: Secondary | ICD-10-CM

## 2022-02-13 DIAGNOSIS — R600 Localized edema: Secondary | ICD-10-CM

## 2022-02-13 DIAGNOSIS — Z96652 Presence of left artificial knee joint: Secondary | ICD-10-CM

## 2022-02-13 MED ORDER — MIRABEGRON ER 25 MG PO TB24: 25.0000 mg | ORAL_TABLET | Freq: Every day | ORAL | 3 refills | Status: DC

## 2022-02-15 ENCOUNTER — Encounter (INDEPENDENT_AMBULATORY_CARE_PROVIDER_SITE_OTHER): Payer: Medicare Other | Admitting: Ophthalmology

## 2022-02-15 DIAGNOSIS — H353211 Exudative age-related macular degeneration, right eye, with active choroidal neovascularization: Secondary | ICD-10-CM

## 2022-02-15 DIAGNOSIS — H353122 Nonexudative age-related macular degeneration, left eye, intermediate dry stage: Secondary | ICD-10-CM

## 2022-02-15 DIAGNOSIS — H43813 Vitreous degeneration, bilateral: Secondary | ICD-10-CM | POA: Diagnosis not present

## 2022-02-15 DIAGNOSIS — I10 Essential (primary) hypertension: Secondary | ICD-10-CM | POA: Diagnosis not present

## 2022-02-15 DIAGNOSIS — H35033 Hypertensive retinopathy, bilateral: Secondary | ICD-10-CM

## 2022-02-15 NOTE — Progress Notes (Signed)
Location:  [place] of Service:  Clinic (12)  Provider:   Code Status:  Goals of Care:     02/13/2022   10:10 AM  Advanced Directives  Does Patient Have a Medical Advance Directive? Yes  Type of Paramedic of [place];Out of facility DNR (pink MOST or yellow form);Living will  Does patient want to make changes to medical advance directive? No - Patient declined  Copy of [place] in Chart? Yes - validated most recent copy scanned in chart (See row information)     Chief Complaint  Patient presents with   Medical Management of Chronic Issues    3 month follow up with MMSE   Quality Metric Gaps    Discussed the need for AWV, Dexa scan and    HPI: Patient is a 87 y.o. female seen today for medical management of chronic diseases.    Patient came for follow-up.  [place]   Patient has a history of left TKA, history of hyponatremia Patient also has history of anemia multifactorial due to iron deficiency and B12 follows with hematology and gets iron infusions as needed Also has history of hypertension and arthritis Urinary incontinence Cognitive impairment  S/p Left TKA Patient is not able to ambulate anymore due to weakness in that leg and feels unstable Ortho has discharged her  She does her transfers and manages with her husband Uses Wheelchair mostly for ambulation  Urinary frequency Wants to try something Has both urge and Stress incontinence Uses pullups [facility] Husband Helps Wants to discuss [place] for safety     Past Medical History:  Diagnosis Date   Dementia ([place])    Gait abnormality 06/20/2020   Hypertension    Hyponatremia    Iron deficiency anemia    Major depression    OA (osteoarthritis)    Pre-diabetes    Vitamin B 12 deficiency     Past Surgical History:  Procedure Laterality Date   ABDOMINAL HYSTERECTOMY     COLONOSCOPY      HEMANGIOMA EXCISION     JOINT REPLACEMENT     PATELLAR TENDON REPAIR Left 01/16/2021   Procedure: Left patellar tendon reconstruction;  Surgeon: Gaynelle Arabian, MD;  Location: [facility];  Service: Orthopedics;  Laterality: Left;   TONSILLECTOMY     TOTAL HIP ARTHROPLASTY Right 02/18/2019   Procedure: TOTAL HIP ARTHROPLASTY ANTERIOR APPROACH;  Surgeon: Gaynelle Arabian, MD;  Location: [facility];  Service: Orthopedics;  Laterality: Right;  158mn   TOTAL KNEE ARTHROPLASTY Right 05/27/2012   Procedure: TOTAL KNEE ARTHROPLASTY;  Surgeon: GMeredith Pel MD;  Location: [facility]  Service: Orthopedics;  Laterality: Right;  Right Total Knee Arthroplasty   TOTAL KNEE ARTHROPLASTY Left 12/12/2020   Procedure: TOTAL KNEE ARTHROPLASTY;  Surgeon: AGaynelle Arabian MD;  [facility]: [facility];  Service: Orthopedics;  Laterality: Left;   TOTAL KNEE REVISION Right 08/06/2018   Procedure: TOTAL KNEE REVISION;  Surgeon: AGaynelle Arabian MD;  [facility]: [facility];  Service: Orthopedics;  Laterality: Right;   TUBAL LIGATION      No Known Allergies  Outpatient Encounter Medications as of 02/13/2022  Medication Sig   acetaminophen (TYLENOL) 500 MG tablet Take 500 mg by mouth every 6 (six) hours as needed for moderate pain.   aspirin EC 81 MG tablet Take 81 mg by mouth daily.   cholecalciferol (VITAMIN D3) 25 MCG (1000 UT) tablet Take 1,000 Units by mouth daily.  COMBIGAN 0.2-0.5 % ophthalmic solution Place 1 drop into the right eye in the morning and at bedtime.   Cyanocobalamin (B-12) 2500 MCG TABS Take 1 tablet by mouth daily.   ferrous sulfate 325 (65 FE) MG tablet Take 325 mg by mouth 3 (three) times a week.   influenza vaccine adjuvanted (FLUAD QUADRIVALENT) 0.5 ML injection Inject into the muscle.   latanoprost (XALATAN) 0.005 % ophthalmic solution Place 1 drop into both eyes at bedtime.    mirabegron ER (MYRBETRIQ) 25 MG TB24 tablet Take 1 tablet (25 mg total) by mouth daily.   Multiple Vitamins-Minerals (PRESERVISION  AREDS 2 PO) Take 1 capsule by mouth in the morning and at bedtime.   nebivolol (BYSTOLIC) 5 MG tablet TAKE 1 TABLET (5 MG TOTAL) BY MOUTH DAILY.   olmesartan (BENICAR) 40 MG tablet TAKE 1 TABLET DAILY   [DISCONTINUED] polyethylene glycol (MIRALAX / GLYCOLAX) 17 g packet Take 17 g by mouth daily. In the morning between 8-11 am   dorzolamide (TRUSOPT) 2 % ophthalmic solution Place 1 drop into the right eye 2 (two) times daily.    No facility-administered encounter medications on file as of 02/13/2022.    Review of Systems:  Review of Systems  Constitutional:  Positive for activity change. Negative for appetite change.  HENT: Negative.    Respiratory:  Negative for cough and shortness of breath.   Cardiovascular:  Negative for leg swelling.  Gastrointestinal:  Negative for constipation.  Genitourinary:  Positive for frequency. Negative for dysuria.  Musculoskeletal:  Positive for gait problem. Negative for arthralgias and myalgias.  Skin: Negative.   Neurological:  Negative for dizziness and weakness.  Psychiatric/Behavioral:  Positive for confusion. Negative for dysphoric mood and sleep disturbance.     Health Maintenance  Topic Date Due   DEXA SCAN  Never done   Medicare Annual Wellness (AWV)  07/21/2021   COVID-19 Vaccine (5 - 2023-24 season) 10/27/2021   DTaP/Tdap/Td (2 - Td or Tdap) 08/01/2031   Pneumonia Vaccine 70+ Years old  Completed   INFLUENZA VACCINE  Completed   Zoster Vaccines- Shingrix  Completed   HPV VACCINES  Aged Out    Physical Exam: Vitals:   02/13/22 1001  BP: 128/84  Pulse: 74  Resp: 17  Temp: 97.8 F (36.6 C)  TempSrc: Temporal  SpO2: 94%  Weight: 134 lb (60.8 kg)  Height: '5\' 4"'$  (1.626 m)   Body mass index is 23 kg/m. Physical Exam Vitals reviewed.  Constitutional:      Appearance: Normal appearance.  HENT:     Head: Normocephalic.     Nose: Nose normal.     Mouth/Throat:     Mouth: Mucous membranes are moist.     Pharynx: Oropharynx is  clear.  Eyes:     Pupils: Pupils are equal, round, and reactive to light.  Cardiovascular:     Rate and Rhythm: Normal rate and regular rhythm.     Pulses: Normal pulses.     Heart sounds: Normal heart sounds. No murmur heard. Pulmonary:     Effort: Pulmonary effort is normal.     Breath sounds: Normal breath sounds.  Abdominal:     General: Abdomen is flat. Bowel sounds are normal.     Palpations: Abdomen is soft.  Musculoskeletal:        General: No swelling.     Cervical back: Neck supple.     Comments: Left Knee slightly swollen  Skin:    General: Skin is warm.  Neurological:  General: No focal deficit present.     Mental Status: She is alert and oriented to person, place, and time.  Psychiatric:        Mood and Affect: Mood normal.        Thought Content: Thought content normal.       02/13/2022   10:15 AM  MMSE - Mini Mental State Exam  Orientation to time 5  Orientation to Place 5  Registration 3  Attention/ Calculation 5  Recall 3  Language- name 2 objects 2  Language- repeat 1  Language- follow 3 step command 3  Language- read & follow direction 1  Write a sentence 1  Copy design 1  Total score 30     Labs reviewed: Basic Metabolic Panel: Recent Labs    03/27/21 1454 07/27/21 0000 11/02/21 0000  NA 139 142 141  K 4.4 3.9 4.0  CL 104 106 104  CO2 29 25* 21  GLUCOSE 94  --   --   BUN '15 19 18  '$ CREATININE 0.81 0.6 0.7  CALCIUM 9.1 8.8 9.0  TSH  --  1.54  --    Liver Function Tests: Recent Labs    03/27/21 1454 07/27/21 0000  AST 17 13  ALT 19 9  ALKPHOS 60 61  BILITOT 0.3  --   PROT 6.9  --   ALBUMIN 4.0 4.2   No results for input(s): "LIPASE", "AMYLASE" in the last 8760 hours. No results for input(s): "AMMONIA" in the last 8760 hours. CBC: Recent Labs    03/27/21 1454 07/27/21 0000  WBC 4.2 3.7  NEUTROABS 2.2  --   HGB 11.3* 12.1  HCT 34.4* 36  MCV 95.8  --   PLT 229 241   Lipid Panel: Recent Labs    07/27/21 0000   CHOL 122  HDL 71*  LDLCALC 40  TRIG 54   No results found for: "HGBA1C"  Procedures since last visit: No results found.  Assessment/Plan 1. Essential hypertension Benicar and Bystolic  2. Lower leg edema Resolved for now  3. Iron deficiency anemia, unspecified iron deficiency anemia type Per Dr Irene Limbo note She had Occult tested by her Previous PCP was negative Hgb stable on Iron and B 12 I discussed GI work-up with her and her husband on last visit and they are not interested right now HGB needs to be checked again  4. Urinary frequency Will Try Mybetriq Husband worried about the cost  5. Status post total left knee replacement Non Ambulatory now Uses Wheelschair  6. B12 deficiency On supplement  7. Short-term memory loss MMSE 30/30 Failed her Clock drawing   Refused labs this morning Will reschedeule before next visit  Labs/tests ordered:  CBC,CMP,Tsh Lipid  Next appt:  05/14/2022

## 2022-03-15 ENCOUNTER — Encounter: Payer: Self-pay | Admitting: Hematology

## 2022-03-23 ENCOUNTER — Encounter: Payer: Self-pay | Admitting: Internal Medicine

## 2022-03-23 ENCOUNTER — Other Ambulatory Visit: Payer: Self-pay

## 2022-03-23 DIAGNOSIS — I10 Essential (primary) hypertension: Secondary | ICD-10-CM

## 2022-03-23 MED ORDER — NEBIVOLOL HCL 5 MG PO TABS: 5.0000 mg | ORAL_TABLET | Freq: Every day | ORAL | 1 refills | Status: DC

## 2022-03-24 ENCOUNTER — Other Ambulatory Visit: Payer: Self-pay | Admitting: Internal Medicine

## 2022-03-24 DIAGNOSIS — I10 Essential (primary) hypertension: Secondary | ICD-10-CM

## 2022-03-26 MED ORDER — NEBIVOLOL HCL 5 MG PO TABS: 5.0000 mg | ORAL_TABLET | Freq: Every day | ORAL | 1 refills | Status: DC

## 2022-03-26 NOTE — Telephone Encounter (Signed)
Left message on voicemail for patient to return call when available , reason for call:  We filled rx at mail order pharmacy on 03/24/23 (3 days ago) and we received a refill request today from the local pharmacy

## 2022-03-27 ENCOUNTER — Encounter: Payer: Self-pay | Admitting: Hematology

## 2022-04-04 ENCOUNTER — Encounter (INDEPENDENT_AMBULATORY_CARE_PROVIDER_SITE_OTHER): Payer: Medicare PPO | Admitting: Ophthalmology

## 2022-04-04 DIAGNOSIS — H35033 Hypertensive retinopathy, bilateral: Secondary | ICD-10-CM | POA: Diagnosis not present

## 2022-04-04 DIAGNOSIS — I10 Essential (primary) hypertension: Secondary | ICD-10-CM | POA: Diagnosis not present

## 2022-04-04 DIAGNOSIS — H353122 Nonexudative age-related macular degeneration, left eye, intermediate dry stage: Secondary | ICD-10-CM | POA: Diagnosis not present

## 2022-04-04 DIAGNOSIS — H43813 Vitreous degeneration, bilateral: Secondary | ICD-10-CM

## 2022-04-04 DIAGNOSIS — H353211 Exudative age-related macular degeneration, right eye, with active choroidal neovascularization: Secondary | ICD-10-CM | POA: Diagnosis not present

## 2022-05-14 ENCOUNTER — Encounter: Payer: Self-pay | Admitting: Adult Health

## 2022-05-14 ENCOUNTER — Non-Acute Institutional Stay: Payer: Medicare PPO | Admitting: Adult Health

## 2022-05-14 VITALS — BP 126/82 | HR 84 | Temp 97.8°F | Resp 16 | Ht 64.0 in

## 2022-05-14 DIAGNOSIS — E2839 Other primary ovarian failure: Secondary | ICD-10-CM | POA: Diagnosis not present

## 2022-05-14 DIAGNOSIS — D508 Other iron deficiency anemias: Secondary | ICD-10-CM | POA: Diagnosis not present

## 2022-05-14 DIAGNOSIS — E871 Hypo-osmolality and hyponatremia: Secondary | ICD-10-CM | POA: Diagnosis not present

## 2022-05-14 DIAGNOSIS — G3184 Mild cognitive impairment, so stated: Secondary | ICD-10-CM

## 2022-05-14 DIAGNOSIS — I10 Essential (primary) hypertension: Secondary | ICD-10-CM

## 2022-05-14 DIAGNOSIS — B353 Tinea pedis: Secondary | ICD-10-CM

## 2022-05-14 DIAGNOSIS — N3281 Overactive bladder: Secondary | ICD-10-CM

## 2022-05-14 MED ORDER — CLOTRIMAZOLE 1 % EX CREA: 1.0000 | TOPICAL_CREAM | Freq: Two times a day (BID) | CUTANEOUS | 0 refills | Status: AC

## 2022-05-14 NOTE — Progress Notes (Signed)
Location:  [facility]  POS: Clinic  Provider: Royal Hawthorn, ANP  Code Status: DNR Goals of Care:     05/14/2022    2:05 PM  Advanced Directives  Does Patient Have a Medical Advance Directive? Yes  Type of Paramedic of [place];Living will;Out of facility DNR (pink MOST or yellow form)     Chief Complaint  Patient presents with   Medical Management of Chronic Issues    3 month follow up. Patient would like her big toes looked at    HPI: Patient is a 88 y.o. female seen today for medical management of chronic diseases.    Reports she has nail fungus and wants me to look at it.   Reports she has right ear wax. Hearing loss worse in right ear. Does not wear hearing aides.   Has urge incontinence and overactive bladder. Gets up frequently at night. Manages it well per her husband. Did not take myrbetriq when prescribed due to cost.   She underwent left patellar repair on 11/21 10/17-10/19 for left  total knee arthroplasty Continues to have issues with ambulation and is working with therapy outpatient. She walks with a walker in her home. For longer distances she uses a motorized scooter.   HX of IDA on iron [facility] with heme neg stool  Has short term memory loss.  Lives with her husband who manages her meds.     02/13/2022   10:15 AM  MMSE - Mini Mental State Exam  Orientation to time 5  Orientation to Place 5  Registration 3  Attention/ Calculation 5  Recall 3  Language- name 2 objects 2  Language- repeat 1  Language- follow 3 step command 3  Language- read & follow direction 1  Write a sentence 1  Copy design 1  Total score 30     BP controlled   Past Medical History:  Diagnosis Date   Dementia ([place])    Gait abnormality 06/20/2020   Hypertension    Hyponatremia    Iron deficiency anemia    Major depression    OA (osteoarthritis)    Pre-diabetes    Vitamin B 12 deficiency     Past Surgical History:  Procedure Laterality  Date   ABDOMINAL HYSTERECTOMY     COLONOSCOPY     HEMANGIOMA EXCISION     JOINT REPLACEMENT     PATELLAR TENDON REPAIR Left 01/16/2021   Procedure: Left patellar tendon reconstruction;  Surgeon: Gaynelle Arabian, MD;  Location: [facility];  Service: Orthopedics;  Laterality: Left;   TONSILLECTOMY     TOTAL HIP ARTHROPLASTY Right 02/18/2019   Procedure: TOTAL HIP ARTHROPLASTY ANTERIOR APPROACH;  Surgeon: Gaynelle Arabian, MD;  Location: [facility];  Service: Orthopedics;  Laterality: Right;  180min   TOTAL KNEE ARTHROPLASTY Right 05/27/2012   Procedure: TOTAL KNEE ARTHROPLASTY;  Surgeon: Meredith Pel, MD;  Location: [place];  Service: Orthopedics;  Laterality: Right;  Right Total Knee Arthroplasty   TOTAL KNEE ARTHROPLASTY Left 12/12/2020   Procedure: TOTAL KNEE ARTHROPLASTY;  Surgeon: Gaynelle Arabian, MD;  [facility];  Service: Orthopedics;  Laterality: Left;   TOTAL KNEE REVISION Right 08/06/2018   Procedure: TOTAL KNEE REVISION;  Surgeon: Gaynelle Arabian, MD;  [facility]: [facility];  Service: Orthopedics;  Laterality: Right;   TUBAL LIGATION      No Known Allergies  Outpatient Encounter Medications as of 05/14/2022  Medication Sig   acetaminophen (TYLENOL) 500 MG tablet Take 500 mg by mouth  every 6 (six) hours as needed for moderate pain.   aspirin EC 81 MG tablet Take 81 mg by mouth daily.   cholecalciferol (VITAMIN D3) 25 MCG (1000 UT) tablet Take 1,000 Units by mouth daily.   COMBIGAN 0.2-0.5 % ophthalmic solution Place 1 drop into the right eye in the morning and at bedtime.   Cyanocobalamin (B-12) 2500 MCG TABS Take 1 tablet by mouth daily.   dorzolamide (TRUSOPT) 2 % ophthalmic solution Place 1 drop into the right eye 2 (two) times daily.    ferrous sulfate 325 (65 FE) MG tablet Take 325 mg by mouth 3 (three) times a week.   influenza vaccine adjuvanted (FLUAD QUADRIVALENT) 0.5 ML injection Inject into the muscle.   latanoprost (XALATAN) 0.005 % ophthalmic solution Place 1 drop into both  eyes at bedtime.    mirabegron ER (MYRBETRIQ) 25 MG TB24 tablet Take 1 tablet (25 mg total) by mouth daily.   Multiple Vitamins-Minerals (PRESERVISION AREDS 2 PO) Take 1 capsule by mouth in the morning and at bedtime.   nebivolol (BYSTOLIC) 5 MG tablet Take 1 tablet (5 mg total) by mouth daily.   olmesartan (BENICAR) 40 MG tablet TAKE 1 TABLET DAILY   No facility-administered encounter medications on file as of 05/14/2022.    Review of Systems:  Review of Systems  Constitutional:  Negative for activity change, appetite change, chills, diaphoresis, fatigue, fever and unexpected weight change.  HENT:  Positive for hearing loss. Negative for congestion.   Respiratory:  Negative for cough, shortness of breath and wheezing.   Cardiovascular:  Negative for chest pain, palpitations and leg swelling.  Gastrointestinal:  Negative for abdominal distention, abdominal pain, constipation and diarrhea.  Genitourinary:  Positive for frequency. Negative for difficulty urinating, dysuria, flank pain and hematuria.  Musculoskeletal:  Positive for gait problem. Negative for arthralgias, back pain, joint swelling and myalgias.       Right knee has some pain at times mild.   Skin:        Great toe nails are thick. Wonders if she has fungus.   Neurological:  Negative for dizziness, tremors, seizures, syncope, facial asymmetry, speech difficulty, weakness, light-headedness, numbness and headaches.  Psychiatric/Behavioral:  Negative for agitation, behavioral problems and confusion.     Health Maintenance  Topic Date Due   DEXA SCAN  Never done   Medicare Annual Wellness (AWV)  07/21/2021   COVID-19 Vaccine (5 - 2023-24 season) 10/27/2021   DTaP/Tdap/Td (2 - Td or Tdap) 08/01/2031   Pneumonia Vaccine 24+ Years old  Completed   INFLUENZA VACCINE  Completed   Zoster Vaccines- Shingrix  Completed   HPV VACCINES  Aged Out    Physical Exam: Vitals:   05/14/22 1400  BP: 126/82  Pulse: 84  Resp: 16  Temp:  97.8 F (36.6 C)  TempSrc: Temporal  SpO2: 96%  Height: 5\' 4"  (1.626 m)   Body mass index is 23 kg/m. Wt Readings from Last 3 Encounters:  02/13/22 134 lb (60.8 kg)  11/15/21 134 lb (60.8 kg)  09/04/21 134 lb 9.6 oz (61.1 kg)    Physical Exam Vitals and nursing note reviewed.  Constitutional:      General: She is not in acute distress.    Appearance: She is not diaphoretic.  HENT:     Head: Normocephalic and atraumatic.     Right Ear: Tympanic membrane normal.     Left Ear: Tympanic membrane normal.     Nose: Nose normal.     Mouth/Throat:  Mouth: Mucous membranes are moist.     Pharynx: Oropharynx is clear.  Eyes:     Conjunctiva/sclera: Conjunctivae normal.     Pupils: Pupils are equal, round, and reactive to light.  Neck:     Vascular: No JVD.  Cardiovascular:     Rate and Rhythm: Normal rate and regular rhythm.     Heart sounds: No murmur heard. Pulmonary:     Effort: Pulmonary effort is normal. No respiratory distress.     Breath sounds: Normal breath sounds. No wheezing.  Abdominal:     General: Bowel sounds are normal. There is no distension.     Palpations: Abdomen is soft.  Musculoskeletal:     Cervical back: No rigidity or tenderness.     Comments: BLE edema RLE +1 LLE trace  Lymphadenopathy:     Cervical: No cervical adenopathy.  Skin:    General: Skin is warm and dry.     Comments: Thick great toenails. White discharge and odor in between toes  Neurological:     Mental Status: She is alert and oriented to person, place, and time.  Psychiatric:        Mood and Affect: Mood normal.     Labs reviewed: Basic Metabolic Panel: Recent Labs    07/27/21 0000 11/02/21 0000  NA 142 141  K 3.9 4.0  CL 106 104  CO2 25* 21  BUN 19 18  CREATININE 0.6 0.7  CALCIUM 8.8 9.0  TSH 1.54  --    Liver Function Tests: Recent Labs    07/27/21 0000  AST 13  ALT 9  ALKPHOS 61  ALBUMIN 4.2   No results for input(s): "LIPASE", "AMYLASE" in the last  8760 hours. No results for input(s): "AMMONIA" in the last 8760 hours. CBC: Recent Labs    07/27/21 0000  WBC 3.7  HGB 12.1  HCT 36  PLT 241   Lipid Panel: Recent Labs    07/27/21 0000  CHOL 122  HDL 71*  LDLCALC 40  TRIG 54   No results found for: "HGBA1C"  Procedures since last visit: No results found.  Assessment/Plan  1. Tinea pedis of both feet Clean feet with warm water and soap twice a day. Dry well in between toes. Apply lotrimin cream twice a day for 1 month.   - clotrimazole (LOTRIMIN AF) 1 % cream; Apply 1 Application topically 2 (two) times daily. Apply to feet after cleansing with soap and water and drying well.  Dispense: 60 g; Refill: 0  2. Estrogen deficiency  - DG BONE DENSITY (DXA); Future  3. Other iron deficiency anemia Needs CBC Continue Iron, if Hgb normal can d/c and recheck prior to next visit.   4. Hyponatremia Hx of this not currently on meds Check CMP  5. Mild cognitive impairment with memory loss Doing well in [place] MMSE 30/30, has short term memory loss.   6. Essential hypertension Controlled  7. Overactive bladder Try voiding twice before bed to avoid frequency.  Avoid fluid after 6pm  Avoid caffeine and alcohol in the afternoon   Labs/tests ordered:  * No order type specified * CBC CMP TSH Lipid 05/22/22 Next appt:  4 months with Dr Lyndel Safe    Total time 71min:  time greater than 50% of total time spent doing pt counseling and coordination of care      Recommend RSV vaccine  Had Covid vaccine in the fall need date.

## 2022-05-14 NOTE — Patient Instructions (Addendum)
Come in the am 05/22/22 to have a CBC CMP TSH Lipid drawn. Come fasting to have your blood drawn.   Try voiding twice before bed to avoid frequency.  Avoid fluid after 6pm  Avoid caffeine and alcohol in the afternoon  Recommend RSV vaccine  Recommend bone density test. Order placed.  If they do not call you within 3 days please give [facility] a call.   Clean feet with warm water and soap twice a day. Dry well in between toes. Apply lotrimin cream twice a day for 1 month.

## 2022-05-24 LAB — BASIC METABOLIC PANEL
BUN: 20 (ref 4–21)
CO2: 22 (ref 13–22)
Chloride: 105 (ref 99–108)
Creatinine: 0.7 (ref 0.5–1.1)
Glucose: 103
Potassium: 4.3 mEq/L (ref 3.5–5.1)
Sodium: 139 (ref 137–147)

## 2022-05-24 LAB — CBC: RBC: 3.93 (ref 3.87–5.11)

## 2022-05-24 LAB — COMPREHENSIVE METABOLIC PANEL
Albumin: 4 (ref 3.5–5.0)
Calcium: 9.1 (ref 8.7–10.7)
eGFR: 81

## 2022-05-24 LAB — HEPATIC FUNCTION PANEL
ALT: 14 U/L (ref 7–35)
AST: 18 (ref 13–35)
Alkaline Phosphatase: 67 (ref 25–125)
Bilirubin, Total: 0.5

## 2022-05-24 LAB — TSH: TSH: 2.65 (ref 0.41–5.90)

## 2022-05-24 LAB — CBC AND DIFFERENTIAL
HCT: 38 (ref 36–46)
Hemoglobin: 12.6 (ref 12.0–16.0)
Platelets: 231 10*3/uL (ref 150–400)
WBC: 4.4

## 2022-05-24 LAB — LIPID PANEL
Cholesterol: 123 (ref 0–200)
HDL: 67 (ref 35–70)
LDL Cholesterol: 41
Triglycerides: 73 (ref 40–160)

## 2022-05-24 LAB — LAB REPORT - SCANNED: EGFR: 81

## 2022-05-30 ENCOUNTER — Encounter (INDEPENDENT_AMBULATORY_CARE_PROVIDER_SITE_OTHER): Payer: Medicare PPO | Admitting: Ophthalmology

## 2022-05-30 DIAGNOSIS — H353122 Nonexudative age-related macular degeneration, left eye, intermediate dry stage: Secondary | ICD-10-CM | POA: Diagnosis not present

## 2022-05-30 DIAGNOSIS — H353211 Exudative age-related macular degeneration, right eye, with active choroidal neovascularization: Secondary | ICD-10-CM

## 2022-05-30 DIAGNOSIS — H43813 Vitreous degeneration, bilateral: Secondary | ICD-10-CM

## 2022-05-30 DIAGNOSIS — H35033 Hypertensive retinopathy, bilateral: Secondary | ICD-10-CM | POA: Diagnosis not present

## 2022-05-30 DIAGNOSIS — I10 Essential (primary) hypertension: Secondary | ICD-10-CM

## 2022-06-28 ENCOUNTER — Telehealth: Payer: Medicare PPO

## 2022-06-28 NOTE — Telephone Encounter (Signed)
Fax received from pharmacy requesting refill on amlodipine 5 mg tablet. Medication is not on active medication list. Looks like medication was discontinued July 2023 due to leg swelling. Should patient be taking this medication.Please advise.  Message sent to Dr. Einar Crow

## 2022-07-02 NOTE — Telephone Encounter (Signed)
No She does not need it

## 2022-07-13 ENCOUNTER — Telehealth: Payer: Self-pay

## 2022-07-13 NOTE — Telephone Encounter (Signed)
Patient called stating she needs a refill on Amlodipine 5 mg.  I informed patient that medication is not on her active medication list and was discontinued in July 2023 and changed to nebivolol. I asked patient if she had been taking amlodipine prior to her call today, she did not answer the question straight forward, and responded by saying " Well my husband said I am on a lot of medications and was out of this one, so that is why I called."  Patient verbalized understanding that amlodipine was replaced with nebivolol.  I will send a message to the [place] provider as a FYI

## 2022-07-13 NOTE — Telephone Encounter (Signed)
Agree she does not need it.  She does have some memory loss and her husband helps manage her care.

## 2022-07-25 ENCOUNTER — Encounter (INDEPENDENT_AMBULATORY_CARE_PROVIDER_SITE_OTHER): Payer: Medicare PPO | Admitting: Ophthalmology

## 2022-07-25 DIAGNOSIS — H353122 Nonexudative age-related macular degeneration, left eye, intermediate dry stage: Secondary | ICD-10-CM | POA: Diagnosis not present

## 2022-07-25 DIAGNOSIS — H353211 Exudative age-related macular degeneration, right eye, with active choroidal neovascularization: Secondary | ICD-10-CM

## 2022-07-25 DIAGNOSIS — I10 Essential (primary) hypertension: Secondary | ICD-10-CM | POA: Diagnosis not present

## 2022-07-25 DIAGNOSIS — H43813 Vitreous degeneration, bilateral: Secondary | ICD-10-CM | POA: Diagnosis not present

## 2022-07-25 DIAGNOSIS — H35033 Hypertensive retinopathy, bilateral: Secondary | ICD-10-CM | POA: Diagnosis not present

## 2022-09-05 ENCOUNTER — Other Ambulatory Visit: Payer: Self-pay | Admitting: Internal Medicine

## 2022-09-05 DIAGNOSIS — I10 Essential (primary) hypertension: Secondary | ICD-10-CM

## 2022-09-13 ENCOUNTER — Other Ambulatory Visit: Payer: Self-pay | Admitting: Internal Medicine

## 2022-09-18 ENCOUNTER — Non-Acute Institutional Stay: Payer: Medicare PPO | Admitting: Internal Medicine

## 2022-09-18 ENCOUNTER — Encounter: Payer: Self-pay | Admitting: Internal Medicine

## 2022-09-18 VITALS — BP 132/82 | HR 66 | Temp 98.0°F | Resp 17 | Ht 64.0 in | Wt 135.0 lb

## 2022-09-18 DIAGNOSIS — N3281 Overactive bladder: Secondary | ICD-10-CM

## 2022-09-18 DIAGNOSIS — D509 Iron deficiency anemia, unspecified: Secondary | ICD-10-CM

## 2022-09-18 DIAGNOSIS — E538 Deficiency of other specified B group vitamins: Secondary | ICD-10-CM | POA: Diagnosis not present

## 2022-09-18 DIAGNOSIS — Z96652 Presence of left artificial knee joint: Secondary | ICD-10-CM | POA: Diagnosis not present

## 2022-09-18 DIAGNOSIS — I10 Essential (primary) hypertension: Secondary | ICD-10-CM | POA: Diagnosis not present

## 2022-09-18 DIAGNOSIS — G3184 Mild cognitive impairment, so stated: Secondary | ICD-10-CM | POA: Diagnosis not present

## 2022-09-18 NOTE — Progress Notes (Signed)
Location:  [facility] [employer] features editor of Service:  Clinic (12)  Provider:   Code Status:  Goals of Care:     09/18/2022    1:24 PM  Advanced Directives  Does Patient Have a Medical Advance Directive? Yes  Type of Advance Directive Living will;Out of facility DNR (pink MOST or yellow form)  Does patient want to make changes to medical advance directive? No - Patient declined     Chief Complaint  Patient presents with   Medical Management of Chronic Issues    4 month follow up   Immunizations    Discussed the need for updated covid vaccine    Health Maintenance    Discuss need for AWV    HPI: Patient is a 88 y.o. female seen today for medical management of chronic diseases.   Patient came for follow-up.  Lives with her [facility] in [facility]   Patient has a history of left TKA, history of hyponatremia Patient also has history of anemia multifactorial due to iron deficiency and B12 follows with hematology and gets iron infusions as needed Also has history of hypertension and arthritis Urinary incontinence Cognitive impairment   S/p Left TKA Patient is not able to ambulate anymore due to weakness in that leg  Uses Power chair and Wheelchair  Manages with the help of her husband [facility] MMSE 30/30  Manages well  Had no acute issues No Falls Weight stable No pain   Past Medical History:  Diagnosis Date   Dementia ([facility])    Gait abnormality 06/20/2020   Hypertension    Hyponatremia    Iron deficiency anemia    Major depression    OA (osteoarthritis)    Pre-diabetes    Vitamin B 12 deficiency     Past Surgical History:  Procedure Laterality Date   ABDOMINAL HYSTERECTOMY     COLONOSCOPY     HEMANGIOMA EXCISION     JOINT REPLACEMENT     PATELLAR TENDON REPAIR Left 01/16/2021   Procedure: Left patellar tendon reconstruction;  Surgeon: Ollen Gross, MD;  Location: [facility];  Service: Orthopedics;  Laterality: Left;   TONSILLECTOMY     TOTAL HIP  ARTHROPLASTY Right 02/18/2019   Procedure: TOTAL HIP ARTHROPLASTY ANTERIOR APPROACH;  Surgeon: Ollen Gross, MD;  Location: [facility];  Service: Orthopedics;  Laterality: Right;    TOTAL KNEE ARTHROPLASTY Right 05/27/2012   Procedure: TOTAL KNEE ARTHROPLASTY;  Surgeon: Cammy Copa, MD;  Location: [facility];  Service: Orthopedics;  Laterality: Right;  Right Total Knee Arthroplasty   TOTAL KNEE ARTHROPLASTY Left 12/12/2020   Procedure: TOTAL KNEE ARTHROPLASTY;  Surgeon: Ollen Gross, MD;  [facility]: [facility];  Service: Orthopedics;  Laterality: Left;   TOTAL KNEE REVISION Right 08/06/2018   Procedure: TOTAL KNEE REVISION;  Surgeon: Ollen Gross, MD;  Location: [facility];  Service: Orthopedics;  Laterality: Right;   TUBAL LIGATION      No Known Allergies  Outpatient Encounter Medications as of 09/18/2022  Medication Sig   acetaminophen (TYLENOL) 500 MG tablet Take 500 mg by mouth every 6 (six) hours as needed for moderate pain.   aspirin EC 81 MG tablet Take 81 mg by mouth daily.   cholecalciferol (VITAMIN D3) 25 MCG (1000 UT) tablet Take 1,000 Units by mouth daily.   COMBIGAN 0.2-0.5 % ophthalmic solution Place 1 drop into the right eye in the morning and at bedtime.   Cyanocobalamin (B-12) 2500 MCG TABS Take 1 tablet by mouth daily.  dorzolamide (TRUSOPT) 2 % ophthalmic solution Place 1 drop into the right eye 2 (two) times daily.    ferrous sulfate 325 (65 FE) MG tablet Take 325 mg by mouth 3 (three) times a week.   influenza vaccine adjuvanted (FLUAD QUADRIVALENT) 0.5 ML injection Inject into the muscle.   latanoprost (XALATAN) 0.005 % ophthalmic solution Place 1 drop into both eyes at bedtime.    Multiple Vitamins-Minerals (PRESERVISION AREDS 2 PO) Take 1 capsule by mouth in the morning and at bedtime.   nebivolol (BYSTOLIC) 5 MG tablet TAKE 1 TABLET EVERY DAY   olmesartan (BENICAR) 40 MG tablet TAKE 1 TABLET DAILY   No facility-administered encounter medications on file as of  09/18/2022.    Review of Systems:  Review of Systems  Constitutional:  Negative for activity change and appetite change.  HENT: Negative.    Respiratory:  Negative for cough and shortness of breath.   Cardiovascular:  Negative for leg swelling.  Gastrointestinal:  Negative for constipation.  Genitourinary: Negative.   Musculoskeletal:  Positive for gait problem. Negative for arthralgias and myalgias.  Skin: Negative.   Neurological:  Negative for dizziness and weakness.  Psychiatric/Behavioral:  Negative for confusion, dysphoric mood and sleep disturbance.     Health Maintenance  Topic Date Due   DEXA SCAN  Never done   Medicare Annual Wellness (AWV)  07/21/2021   COVID-19 Vaccine (6 - 2023-24 season) 10/27/2021   INFLUENZA VACCINE  09/27/2022   DTaP/Tdap/Td (2 - Td or Tdap) 08/01/2031   Pneumonia Vaccine 9+ Years old  Completed   Zoster Vaccines- Shingrix  Completed   HPV VACCINES  Aged Out    Physical Exam: Vitals:   09/18/22 1326  BP: 132/82  Pulse: 66  Resp: 17  Temp: 98 F (36.7 C)  TempSrc: Temporal  SpO2: 97%  Weight: 135 lb (61.2 kg)  Height: 5\' 4"  (1.626 m)   Body mass index is 23.17 kg/m. Physical Exam Vitals reviewed.  Constitutional:      Appearance: Normal appearance.  HENT:     Head: Normocephalic.     Nose: Nose normal.     Mouth/Throat:     Mouth: Mucous membranes are moist.     Pharynx: Oropharynx is clear.  Eyes:     Pupils: Pupils are equal, round, and reactive to light.  Cardiovascular:     Rate and Rhythm: Normal rate and regular rhythm.     Pulses: Normal pulses.     Heart sounds: Normal heart sounds. No murmur heard. Pulmonary:     Effort: Pulmonary effort is normal.     Breath sounds: Normal breath sounds.  Abdominal:     General: Abdomen is flat. Bowel sounds are normal.     Palpations: Abdomen is soft.  Musculoskeletal:        General: No swelling.     Cervical back: Neck supple.     Comments: Mild edema Bilateral   Skin:    General: Skin is warm.  Neurological:     General: No focal deficit present.     Mental Status: She is alert and oriented to person, place, and time.  Psychiatric:        Mood and Affect: Mood normal.        Thought Content: Thought content normal.     Labs reviewed: Basic Metabolic Panel: Recent Labs    11/02/21 0000 05/24/22 0000  NA 141 139  K 4.0 4.3  CL 104 105  CO2 21 22  BUN  18 20  CREATININE 0.7 0.7  CALCIUM 9.0 9.1  TSH  --  2.65   Liver Function Tests: Recent Labs    05/24/22 0000  AST 18  ALT 14  ALKPHOS 67  ALBUMIN 4.0   No results for input(s): "LIPASE", "AMYLASE" in the last 8760 hours. No results for input(s): "AMMONIA" in the last 8760 hours. CBC: Recent Labs    05/24/22 0000  WBC 4.4  HGB 12.6  HCT 38  PLT 231   Lipid Panel: Recent Labs    05/24/22 0000  CHOL 123  HDL 67  LDLCALC 41  TRIG 73   No results found for: "HGBA1C"  Procedures since last visit: No results found.  Assessment/Plan 1. Essential hypertension Benicar and Bystolic  2. Overactive bladder Refuses meds  3. Iron deficiency anemia, unspecified iron deficiency anemia type Per Dr Candise Che note She had Occult tested by her Previous PCP was negative Hgb stable on Iron and B 12 GI work up refused  4. B12 deficiency On Supplement  5. Status post total left knee replacement Cannot Ambulate  6. Mild cognitive impairment with memory loss MMSE 30/30 Failed her clock Manages well    Labs/tests ordered:  CBC,CMP,TSH, Next appt:  Visit date not found

## 2022-09-19 ENCOUNTER — Encounter (INDEPENDENT_AMBULATORY_CARE_PROVIDER_SITE_OTHER): Payer: Medicare PPO | Admitting: Ophthalmology

## 2022-09-19 DIAGNOSIS — H353211 Exudative age-related macular degeneration, right eye, with active choroidal neovascularization: Secondary | ICD-10-CM | POA: Diagnosis not present

## 2022-09-19 DIAGNOSIS — H353122 Nonexudative age-related macular degeneration, left eye, intermediate dry stage: Secondary | ICD-10-CM | POA: Diagnosis not present

## 2022-09-19 DIAGNOSIS — I10 Essential (primary) hypertension: Secondary | ICD-10-CM | POA: Diagnosis not present

## 2022-09-19 DIAGNOSIS — H43813 Vitreous degeneration, bilateral: Secondary | ICD-10-CM | POA: Diagnosis not present

## 2022-09-19 DIAGNOSIS — H35033 Hypertensive retinopathy, bilateral: Secondary | ICD-10-CM

## 2022-10-31 DIAGNOSIS — H26491 Other secondary cataract, right eye: Secondary | ICD-10-CM | POA: Diagnosis not present

## 2022-10-31 DIAGNOSIS — H353121 Nonexudative age-related macular degeneration, left eye, early dry stage: Secondary | ICD-10-CM | POA: Diagnosis not present

## 2022-10-31 DIAGNOSIS — H401131 Primary open-angle glaucoma, bilateral, mild stage: Secondary | ICD-10-CM | POA: Diagnosis not present

## 2022-10-31 DIAGNOSIS — Z961 Presence of intraocular lens: Secondary | ICD-10-CM | POA: Diagnosis not present

## 2022-10-31 DIAGNOSIS — H3530 Unspecified macular degeneration: Secondary | ICD-10-CM | POA: Diagnosis not present

## 2022-11-07 ENCOUNTER — Encounter: Payer: Self-pay | Admitting: Hematology

## 2022-11-07 ENCOUNTER — Other Ambulatory Visit (HOSPITAL_BASED_OUTPATIENT_CLINIC_OR_DEPARTMENT_OTHER): Payer: Self-pay

## 2022-11-07 MED ORDER — COMIRNATY 30 MCG/0.3ML IM SUSY
0.3000 mL | PREFILLED_SYRINGE | Freq: Once | INTRAMUSCULAR | 0 refills | Status: AC
  Filled 2022-11-07: qty 0.3, 1d supply, fill #0

## 2022-11-09 ENCOUNTER — Encounter: Payer: Self-pay | Admitting: Hematology

## 2022-11-13 DIAGNOSIS — H903 Sensorineural hearing loss, bilateral: Secondary | ICD-10-CM | POA: Diagnosis not present

## 2022-11-14 ENCOUNTER — Encounter (INDEPENDENT_AMBULATORY_CARE_PROVIDER_SITE_OTHER): Payer: Medicare PPO | Admitting: Ophthalmology

## 2022-11-14 DIAGNOSIS — H43813 Vitreous degeneration, bilateral: Secondary | ICD-10-CM

## 2022-11-14 DIAGNOSIS — H35033 Hypertensive retinopathy, bilateral: Secondary | ICD-10-CM

## 2022-11-14 DIAGNOSIS — H353211 Exudative age-related macular degeneration, right eye, with active choroidal neovascularization: Secondary | ICD-10-CM

## 2022-11-14 DIAGNOSIS — I10 Essential (primary) hypertension: Secondary | ICD-10-CM

## 2022-11-14 DIAGNOSIS — H353122 Nonexudative age-related macular degeneration, left eye, intermediate dry stage: Secondary | ICD-10-CM

## 2022-11-16 ENCOUNTER — Ambulatory Visit
Admission: RE | Admit: 2022-11-16 | Discharge: 2022-11-16 | Disposition: A | Discharge status: Home / Self Care | Payer: Medicare PPO | Source: Ambulatory Visit | Attending: Adult Health

## 2022-11-16 DIAGNOSIS — E349 Endocrine disorder, unspecified: Secondary | ICD-10-CM | POA: Diagnosis not present

## 2022-11-16 DIAGNOSIS — E2839 Other primary ovarian failure: Secondary | ICD-10-CM

## 2022-11-16 DIAGNOSIS — M8588 Other specified disorders of bone density and structure, other site: Secondary | ICD-10-CM | POA: Diagnosis not present

## 2022-11-16 DIAGNOSIS — N958 Other specified menopausal and perimenopausal disorders: Secondary | ICD-10-CM | POA: Diagnosis not present

## 2022-12-07 ENCOUNTER — Other Ambulatory Visit (HOSPITAL_BASED_OUTPATIENT_CLINIC_OR_DEPARTMENT_OTHER): Payer: Self-pay

## 2022-12-07 MED ORDER — INFLUENZA VAC A&B SURF ANT ADJ 0.5 ML IM SUSY
0.5000 mL | PREFILLED_SYRINGE | Freq: Once | INTRAMUSCULAR | 0 refills | Status: AC
  Filled 2022-12-07: qty 0.5, 1d supply, fill #0

## 2022-12-14 DIAGNOSIS — H903 Sensorineural hearing loss, bilateral: Secondary | ICD-10-CM | POA: Diagnosis not present

## 2023-01-09 ENCOUNTER — Encounter (INDEPENDENT_AMBULATORY_CARE_PROVIDER_SITE_OTHER): Payer: Medicare PPO | Admitting: Ophthalmology

## 2023-01-09 DIAGNOSIS — I10 Essential (primary) hypertension: Secondary | ICD-10-CM

## 2023-01-09 DIAGNOSIS — H353211 Exudative age-related macular degeneration, right eye, with active choroidal neovascularization: Secondary | ICD-10-CM

## 2023-01-09 DIAGNOSIS — H43813 Vitreous degeneration, bilateral: Secondary | ICD-10-CM | POA: Diagnosis not present

## 2023-01-09 DIAGNOSIS — H353122 Nonexudative age-related macular degeneration, left eye, intermediate dry stage: Secondary | ICD-10-CM | POA: Diagnosis not present

## 2023-01-09 DIAGNOSIS — H35033 Hypertensive retinopathy, bilateral: Secondary | ICD-10-CM | POA: Diagnosis not present

## 2023-01-28 ENCOUNTER — Non-Acute Institutional Stay: Payer: Medicare PPO | Admitting: Adult Health

## 2023-01-28 ENCOUNTER — Encounter: Payer: Self-pay | Admitting: Adult Health

## 2023-01-28 VITALS — BP 136/84 | HR 63 | Temp 97.7°F | Resp 17 | Ht 64.0 in | Wt 135.0 lb

## 2023-01-28 DIAGNOSIS — D508 Other iron deficiency anemias: Secondary | ICD-10-CM | POA: Diagnosis not present

## 2023-01-28 DIAGNOSIS — G3184 Mild cognitive impairment, so stated: Secondary | ICD-10-CM | POA: Diagnosis not present

## 2023-01-28 DIAGNOSIS — R051 Acute cough: Secondary | ICD-10-CM | POA: Diagnosis not present

## 2023-01-28 DIAGNOSIS — I10 Essential (primary) hypertension: Secondary | ICD-10-CM

## 2023-01-28 DIAGNOSIS — R0789 Other chest pain: Secondary | ICD-10-CM

## 2023-01-28 MED ORDER — NEBIVOLOL HCL 5 MG PO TABS
5.0000 mg | ORAL_TABLET | Freq: Every day | ORAL | 1 refills | Status: DC
Start: 2023-01-28 — End: 2023-07-17

## 2023-01-28 NOTE — Progress Notes (Signed)
Location:  [facility]  POS: Clinic  Provider: Fletcher Anon, ANP  Code Status: DNR Goals of Care:     01/28/2023    2:03 PM  Advanced Directives  Does Patient Have a Medical Advance Directive? Yes  Type of Advance Directive Living will;Healthcare Power of [place];Out of facility DNR (pink MOST or yellow form)  Does patient want to make changes to medical advance directive? No - Patient declined  Copy of Healthcare Power of Attorney in Chart? No - copy requested     Chief Complaint  Patient presents with   Medical Management of Chronic Issues    Patient is being seen for a follow up   Health Maintenance    Patient is due for a AWV    HPI: Patient is a 88 y.o. female seen today for medical management of chronic diseases.    Reports she has something with her heart and connected with her teeth. She has read about it but doesn't know. She said some times there is pain in the chest and goes up into the jaw. Has happened twice. She is worried and wants to see a cardiologist. The issue is not present now. She has memory problems and has trouble repeating the history. Her husband did not come to the apt today  Reports coughing and sneezing with a meal. Does not feel like she is choking. No esophageal discomfort with eating. No runny nose.   She says she has trouble getting her coats and shirts on herself now. Wonders why she is having trouble dressing.  Also wants me to look at red spots on her arms and dry skin issues. No itching or rash.   She underwent left patellar repair on 01/16/21 10/17-10/19/22 for left  total knee arthroplasty Continues to have issues with ambulation and uses a walker and wheelchair. Also has a PWC.  HX of IDA on iron [facility] with heme neg stool. Has seen heme/onc in the past.   Has short term memory loss.  Lives with her husband who manages her meds.     02/13/2022   10:15 AM  MMSE - Mini Mental State Exam  Orientation to time 5  Orientation to  Place 5  Registration 3  Attention/ Calculation 5  Recall 3  Language- name 2 objects 2  Language- repeat 1  Language- follow 3 step command 3  Language- read & follow direction 1  Write a sentence 1  Copy design 1  Total score 30     BP controlled   Past Medical History:  Diagnosis Date   Dementia (HCC)    Gait abnormality 06/20/2020   Hypertension    Hyponatremia    Iron deficiency anemia    Major depression    OA (osteoarthritis)    Pre-diabetes    Vitamin B 12 deficiency     Past Surgical History:  Procedure Laterality Date   ABDOMINAL HYSTERECTOMY     COLONOSCOPY     HEMANGIOMA EXCISION     JOINT REPLACEMENT     PATELLAR TENDON REPAIR Left 01/16/2021   Procedure: Left patellar tendon reconstruction;  Surgeon: Ollen Gross, MD;  Location: [facility];  Service: Orthopedics;  Laterality: Left;   TONSILLECTOMY     TOTAL HIP ARTHROPLASTY Right 02/18/2019   Procedure: TOTAL HIP ARTHROPLASTY ANTERIOR APPROACH;  Surgeon: Ollen Gross, MD;  Location: [facility];  Service: Orthopedics;  Laterality: Right;    TOTAL KNEE ARTHROPLASTY Right 05/27/2012   Procedure: TOTAL KNEE ARTHROPLASTY;  Surgeon: Cammy Copa, MD;  Location: [facility];  Service: Orthopedics;  Laterality: Right;  Right Total Knee Arthroplasty   TOTAL KNEE ARTHROPLASTY Left 12/12/2020   Procedure: TOTAL KNEE ARTHROPLASTY;  Surgeon: Ollen Gross, MD;  [facility]: [facility];  Service: Orthopedics;  Laterality: Left;   TOTAL KNEE REVISION Right 08/06/2018   Procedure: TOTAL KNEE REVISION;  Surgeon: Ollen Gross, MD;  Location: [facility];  Service: Orthopedics;  Laterality: Right;   TUBAL LIGATION      Not on File  Outpatient Encounter Medications as of 01/28/2023  Medication Sig   acetaminophen (TYLENOL) 500 MG tablet Take 500 mg by mouth every 6 (six) hours as needed for moderate pain.   aspirin EC 81 MG tablet Take 81 mg by mouth daily.   cholecalciferol (VITAMIN D3) 25 MCG (1000 UT) tablet Take 1,000 Units  by mouth daily.   COMBIGAN 0.2-0.5 % ophthalmic solution Place 1 drop into the right eye in the morning and at bedtime.   Cyanocobalamin (B-12) 2500 MCG TABS Take 1 tablet by mouth daily.   dorzolamide (TRUSOPT) 2 % ophthalmic solution Place 1 drop into the right eye 2 (two) times daily.    ferrous sulfate 325 (65 FE) MG tablet Take 325 mg by mouth 3 (three) times a week.   influenza vaccine adjuvanted (FLUAD QUADRIVALENT) 0.5 ML injection Inject into the muscle.   latanoprost (XALATAN) 0.005 % ophthalmic solution Place 1 drop into both eyes at bedtime.    Multiple Vitamins-Minerals (PRESERVISION AREDS 2 PO) Take 1 capsule by mouth in the morning and at bedtime.   nebivolol (BYSTOLIC) 5 MG tablet TAKE 1 TABLET EVERY DAY   olmesartan (BENICAR) 40 MG tablet TAKE 1 TABLET DAILY   No facility-administered encounter medications on file as of 01/28/2023.    Review of Systems:  Review of Systems  Constitutional:  Negative for activity change, appetite change, chills, diaphoresis, fatigue, fever and unexpected weight change.  HENT:  Positive for hearing loss and sneezing. Negative for congestion.   Respiratory:  Positive for cough. Negative for shortness of breath and wheezing.   Cardiovascular:  Positive for chest pain. Negative for palpitations and leg swelling.  Gastrointestinal:  Negative for abdominal distention, abdominal pain, constipation and diarrhea.  Genitourinary:  Negative for difficulty urinating, dysuria, flank pain and hematuria.  Musculoskeletal:  Positive for gait problem. Negative for arthralgias, back pain, joint swelling and myalgias.       Right knee has some pain at times mild.   Neurological:  Negative for dizziness, tremors, seizures, syncope, facial asymmetry, speech difficulty, weakness, light-headedness, numbness and headaches.  Psychiatric/Behavioral:  Positive for confusion. Negative for agitation and behavioral problems.     Health Maintenance  Topic Date Due    Medicare Annual Wellness (AWV)  07/21/2021   COVID-19 Vaccine (8 - 2023-24 season) 01/02/2023   DTaP/Tdap/Td (2 - Td or Tdap) 08/01/2031   Pneumonia Vaccine 49+ Years old  Completed   INFLUENZA VACCINE  Completed   DEXA SCAN  Completed   Zoster Vaccines- Shingrix  Completed   HPV VACCINES  Aged Out    Physical Exam: Vitals:   01/28/23 1401  BP: 136/84  Pulse: 63  Resp: 17  Temp: 97.7 F (36.5 C)  TempSrc: Temporal  SpO2: 98%  Weight: 135 lb (61.2 kg)  Height: 5\' 4"  (1.626 m)   Body mass index is 23.17 kg/m. Wt Readings from Last 3 Encounters:  01/28/23 135 lb (61.2 kg)  09/18/22 135 lb (61.2 kg)  02/13/22  134 lb (60.8 kg)    Physical Exam Vitals and nursing note reviewed.  Constitutional:      General: She is not in acute distress.    Appearance: She is not diaphoretic.  HENT:     Head: Normocephalic and atraumatic.     Right Ear: Tympanic membrane normal.     Left Ear: Tympanic membrane normal.     Nose: Nose normal.     Mouth/Throat:     Mouth: Mucous membranes are moist.     Pharynx: Oropharynx is clear.  Eyes:     Conjunctiva/sclera: Conjunctivae normal.     Pupils: Pupils are equal, round, and reactive to light.  Neck:     Vascular: No JVD.  Cardiovascular:     Rate and Rhythm: Normal rate and regular rhythm.     Heart sounds: No murmur heard. Pulmonary:     Effort: Pulmonary effort is normal. No respiratory distress.     Breath sounds: Normal breath sounds. No wheezing.  Abdominal:     General: Bowel sounds are normal. There is no distension.     Palpations: Abdomen is soft.  Musculoskeletal:     Cervical back: No rigidity or tenderness.     Comments: Trace edema to both ankles.   Lymphadenopathy:     Cervical: No cervical adenopathy.  Skin:    General: Skin is warm and dry.     Comments: Thick great toenails  Neurological:     Mental Status: She is alert and oriented to person, place, and time.  Psychiatric:        Mood and Affect: Mood  normal.     Labs reviewed: Basic Metabolic Panel: Recent Labs    05/24/22 0000  NA 139  K 4.3  CL 105  CO2 22  BUN 20  CREATININE 0.7  CALCIUM 9.1  TSH 2.65   Liver Function Tests: Recent Labs    05/24/22 0000  AST 18  ALT 14  ALKPHOS 67  ALBUMIN 4.0   No results for input(s): "LIPASE", "AMYLASE" in the last 8760 hours. No results for input(s): "AMMONIA" in the last 8760 hours. CBC: Recent Labs    05/24/22 0000  WBC 4.4  HGB 12.6  HCT 38  PLT 231   Lipid Panel: Recent Labs    05/24/22 0000  CHOL 123  HDL 67  LDLCALC 41  TRIG 73   No results found for: "HGBA1C" No results found.  Assessment/Plan   1. Essential hypertension Controlled Requested refill for Bystolic Continue Benicar.   2. Other chest pain She is not currently having symptoms.  Did have symptoms concerning for angina in the past few months but her history was difficult to obtain due to underlying cognitive impairment.  Check labs  - Ambulatory referral to Cardiology  3. Mild cognitive impairment with memory loss MMSE 30/30 Having trouble focusing on one topic in the visit.  Does remember details of her care.  Recommend AWV for f/u MMSE, she declined.   4. Other iron deficiency anemia Check CBC  Continue Iron and B12   5. Acute cough She reported a cough/sneeze with meals.  Discussed aspiration vs vasomotor rhinitis. I recommended Speech therapy for an evaluation but she declined.     Labs/tests ordered:  * No order type specified * CBC CMP TSH Lipid 01/31/23 Next appt:  3 months with Dr Chales Abrahams    Total time :  time greater than 50% of total time spent doing pt counseling and coordination of  care

## 2023-01-28 NOTE — Patient Instructions (Signed)
You need blood work Thursday 01/31/23. The clinic nurse will reach out for an apt.

## 2023-01-31 DIAGNOSIS — I10 Essential (primary) hypertension: Secondary | ICD-10-CM | POA: Diagnosis not present

## 2023-01-31 DIAGNOSIS — R0789 Other chest pain: Secondary | ICD-10-CM | POA: Diagnosis not present

## 2023-01-31 LAB — BASIC METABOLIC PANEL
BUN: 20 (ref 4–21)
CO2: 24 — AB (ref 13–22)
Chloride: 104 (ref 99–108)
Creatinine: 0.7 (ref 0.5–1.1)
Glucose: 121
Potassium: 4.1 meq/L (ref 3.5–5.1)
Sodium: 139 (ref 137–147)

## 2023-01-31 LAB — TSH: TSH: 2.39 (ref 0.41–5.90)

## 2023-01-31 LAB — COMPREHENSIVE METABOLIC PANEL
Albumin: 3.7 (ref 3.5–5.0)
Calcium: 8.7 (ref 8.7–10.7)
Globulin: 2.6
eGFR: 84

## 2023-01-31 LAB — HEPATIC FUNCTION PANEL
ALT: 26 U/L (ref 7–35)
AST: 21 (ref 13–35)
Alkaline Phosphatase: 100 (ref 25–125)
Bilirubin, Total: 0.3

## 2023-03-06 ENCOUNTER — Encounter (INDEPENDENT_AMBULATORY_CARE_PROVIDER_SITE_OTHER): Payer: Medicare PPO | Admitting: Ophthalmology

## 2023-03-06 DIAGNOSIS — I10 Essential (primary) hypertension: Secondary | ICD-10-CM | POA: Diagnosis not present

## 2023-03-06 DIAGNOSIS — H353122 Nonexudative age-related macular degeneration, left eye, intermediate dry stage: Secondary | ICD-10-CM | POA: Diagnosis not present

## 2023-03-06 DIAGNOSIS — H43813 Vitreous degeneration, bilateral: Secondary | ICD-10-CM | POA: Diagnosis not present

## 2023-03-06 DIAGNOSIS — H35033 Hypertensive retinopathy, bilateral: Secondary | ICD-10-CM | POA: Diagnosis not present

## 2023-03-06 DIAGNOSIS — H353211 Exudative age-related macular degeneration, right eye, with active choroidal neovascularization: Secondary | ICD-10-CM | POA: Diagnosis not present

## 2023-03-19 DIAGNOSIS — E785 Hyperlipidemia, unspecified: Secondary | ICD-10-CM | POA: Diagnosis not present

## 2023-03-19 DIAGNOSIS — G3184 Mild cognitive impairment, so stated: Secondary | ICD-10-CM | POA: Diagnosis not present

## 2023-03-19 DIAGNOSIS — I10 Essential (primary) hypertension: Secondary | ICD-10-CM | POA: Diagnosis not present

## 2023-03-19 LAB — BASIC METABOLIC PANEL
BUN: 19 (ref 4–21)
CO2: 22 (ref 13–22)
Chloride: 105 (ref 99–108)
Creatinine: 0.7 (ref 0.5–1.1)
Glucose: 118
Potassium: 4.3 meq/L (ref 3.5–5.1)
Sodium: 139 (ref 137–147)

## 2023-03-19 LAB — LIPID PANEL
Cholesterol: 109 (ref 0–200)
HDL: 63 (ref 35–70)
LDL Cholesterol: 36
LDl/HDL Ratio: 1.7
Triglycerides: 48 (ref 40–160)

## 2023-03-19 LAB — HEPATIC FUNCTION PANEL
ALT: 26 U/L (ref 7–35)
AST: 22 (ref 13–35)
Alkaline Phosphatase: 97 (ref 25–125)
Bilirubin, Total: 0.4

## 2023-03-19 LAB — CBC AND DIFFERENTIAL
HCT: 36 (ref 36–46)
Hemoglobin: 12.3 (ref 12.0–16.0)
Platelets: 288 10*3/uL (ref 150–400)
WBC: 6.1

## 2023-03-19 LAB — CBC: RBC: 3.79 — AB (ref 3.87–5.11)

## 2023-03-19 LAB — COMPREHENSIVE METABOLIC PANEL
Albumin: 3.8 (ref 3.5–5.0)
Calcium: 8.8 (ref 8.7–10.7)
Globulin: 3.1
eGFR: 82

## 2023-03-19 LAB — TSH: TSH: 2.1 (ref 0.41–5.90)

## 2023-03-25 ENCOUNTER — Encounter: Payer: Medicare PPO | Admitting: Adult Health

## 2023-04-19 ENCOUNTER — Encounter (HOSPITAL_BASED_OUTPATIENT_CLINIC_OR_DEPARTMENT_OTHER): Payer: Self-pay | Admitting: Cardiology

## 2023-04-19 ENCOUNTER — Ambulatory Visit (HOSPITAL_BASED_OUTPATIENT_CLINIC_OR_DEPARTMENT_OTHER): Payer: Medicare PPO | Admitting: Cardiology

## 2023-04-19 VITALS — BP 138/72 | HR 70 | Ht 64.0 in | Wt 139.0 lb

## 2023-04-19 DIAGNOSIS — R6884 Jaw pain: Secondary | ICD-10-CM | POA: Diagnosis not present

## 2023-04-19 DIAGNOSIS — I10 Essential (primary) hypertension: Secondary | ICD-10-CM

## 2023-04-19 NOTE — Patient Instructions (Signed)
 Medication Instructions:  Your physician recommends that you continue on your current medications as directed. Please refer to the Current Medication list given to you today.  Follow-Up: At [facility], you and your health needs are our priority.  As part of our continuing mission to provide you with exceptional heart care, we have created designated Provider Care Teams.  These Care Teams include your primary Cardiologist (physician) and Advanced Practice Providers (APPs -  Physician Assistants and Nurse Practitioners) who all work together to provide you with the care you need, when you need it.  We recommend signing up for the patient portal called "MyChart".  Sign up information is provided on this After Visit Summary.  MyChart is used to connect with patients for Virtual Visits (Telemedicine).  Patients are able to view lab/test results, encounter notes, upcoming appointments, etc.  Non-urgent messages can be sent to your provider as well.   To learn more about what you can do with MyChart, go to [email or web address].    Your next appointment:   As needed  Provider:   Jodelle Red, MD

## 2023-04-19 NOTE — Progress Notes (Signed)
  Cardiology Office Note:  .   Date:  04/19/2023  ID:  Amy Carroll, DOB [birth year], MRN [id number] PCP: Amy Gammon, MD  [facility] Providers Cardiologist:  None {  History of Present Illness: .   Amy Carroll is a 89 y.o. female with PMH hypertension, memory impairment seen as a new consult for chest pain at the request of Amy Anon, NP.  Today: Note from 01/28/23 reviewed. Noted intermittent chest pain that moves up to the jaw on two occasions. Referred to cardiology for further evaluation.  Here with her husband today. She does not feel that this is chest pain. She feels that this is a syndrome that only occurs in women related to jaw pain, has occurred twice. She isn't sure but thinks it starts in the arm. Lasts only seconds. No triggers. More in the right side. No other associated symptoms.  Denies chest pressure/heaviness ever. Denies shortness of breath. No palpitations. No syncope.  She has never been told that she has any issues with her heart in the past.  FH: father died of MI in his 41s.  ROS: Denies chest pain, shortness of breath at rest or with normal exertion. No PND, orthopnea, LE edema or unexpected weight gain. No syncope or palpitations. ROS otherwise negative except as noted.   Studies Reviewed: Marland Kitchen    EKG:  EKG Interpretation Date/Time:  Friday April 19 2023 15:53:39 EST Ventricular Rate:  66 PR Interval:  168 QRS Duration:  82 QT Interval:  438 QTC Calculation: 459 R Axis:   1  Text Interpretation: Normal sinus rhythm Nonspecific T wave abnormality Confirmed by Amy Carroll [phone] on 04/19/2023 4:27:51 PM    Physical Exam:   VS:  BP 138/72   Pulse 70   Ht 5\' 4"  (1.626 m)   Wt 139 lb (63 kg)   SpO2 96%   BMI 23.86 kg/m    Wt Readings from Last 3 Encounters:  04/19/23 139 lb (63 kg)  01/28/23 135 lb (61.2 kg)  09/18/22 135 lb (61.2 kg)    GEN: Well nourished, well developed in no acute distress HEENT:  Normal, moist mucous membranes NECK: No JVD CARDIAC: regular rhythm, normal S1 and S2, no rubs or gallops. 1/6 systolic murmur murmur. VASCULAR: Radial and DP pulses 2+ bilaterally. No carotid bruits RESPIRATORY:  Clear to auscultation without rales, wheezing or rhonchi  ABDOMEN: Soft, non-tender, non-distended MUSCULOSKELETAL:  Ambulates independently with rollator SKIN: Warm and dry, no edema NEUROLOGIC:  Alert and oriented x 3. No focal neuro deficits noted. PSYCHIATRIC:  Normal affect    ASSESSMENT AND PLAN: .    Jaw pain: -describes as brief, mild, nonlimiting. She is unclear where it starts, possibly her arm, but cannot recall -denies any chest pain -we reviewed typical signs of angina. Though her symptoms are atypical, we did discuss further workup. She declines at this time but will contact me if symptoms worsen -ECG without high risk findings -reviewed [place] flag warning signs that need immediate medical attention  Hypertension -within range given her age. Continue nebivolol 5 mg daily and olmesartan 40 mg daily  Dispo: I would be happy to see her back as needed  Signed, Amy Red, MD   Amy Red, MD, PhD, [facility]  [facility]  [place] [address] [place], [place] [address] [phone]

## 2023-04-30 DIAGNOSIS — Z961 Presence of intraocular lens: Secondary | ICD-10-CM | POA: Diagnosis not present

## 2023-04-30 DIAGNOSIS — H353121 Nonexudative age-related macular degeneration, left eye, early dry stage: Secondary | ICD-10-CM | POA: Diagnosis not present

## 2023-04-30 DIAGNOSIS — H26491 Other secondary cataract, right eye: Secondary | ICD-10-CM | POA: Diagnosis not present

## 2023-04-30 DIAGNOSIS — H401131 Primary open-angle glaucoma, bilateral, mild stage: Secondary | ICD-10-CM | POA: Diagnosis not present

## 2023-04-30 DIAGNOSIS — H3530 Unspecified macular degeneration: Secondary | ICD-10-CM | POA: Diagnosis not present

## 2023-05-07 ENCOUNTER — Non-Acute Institutional Stay: Payer: Medicare PPO | Admitting: Internal Medicine

## 2023-05-07 ENCOUNTER — Encounter: Payer: Self-pay | Admitting: Internal Medicine

## 2023-05-07 VITALS — BP 130/80 | Temp 98.1°F | Resp 21 | Ht 64.0 in | Wt 138.0 lb

## 2023-05-07 DIAGNOSIS — D508 Other iron deficiency anemias: Secondary | ICD-10-CM | POA: Diagnosis not present

## 2023-05-07 DIAGNOSIS — I10 Essential (primary) hypertension: Secondary | ICD-10-CM

## 2023-05-07 DIAGNOSIS — R0789 Other chest pain: Secondary | ICD-10-CM

## 2023-05-07 DIAGNOSIS — E538 Deficiency of other specified B group vitamins: Secondary | ICD-10-CM | POA: Diagnosis not present

## 2023-05-07 DIAGNOSIS — G3184 Mild cognitive impairment, so stated: Secondary | ICD-10-CM | POA: Diagnosis not present

## 2023-05-07 NOTE — Progress Notes (Signed)
 Location:  [facility] features editor of Service:  Clinic (12)  Provider:   Code Status: DNR Goals of Care:     01/28/2023    2:03 PM  Advanced Directives  Does Patient Have a Medical Advance Directive? Yes  Type of Advance Directive Living will;Healthcare Power of [place];Out of facility DNR (pink MOST or yellow form)  Does patient want to make changes to medical advance directive? No - Patient declined  Copy of Healthcare Power of Attorney in Chart? No - copy requested     Chief Complaint  Patient presents with   Medical Management of Chronic Issues    3 month follow up.    HPI: Patient is a 89 y.o. female seen today for medical management of chronic diseases.   [place] with her husband in [place]  Patient has a history of left TKA, history of hyponatremia Patient also has history of anemia multifactorial due to iron deficiency and B12 follows with hematology and gets iron infusions as needed Also has history of hypertension and arthritis Urinary incontinence Cognitive impairment  S/p Left TKA Patient is not able to ambulate anymore due to weakness in that leg  Uses Power chair and Wheelchair  Manages with the help of her husband [facility] MMSE 30/30  Manages well  Discussed the use of AI scribe software for clinical note transcription with the patient, who gave verbal consent to proceed.  History of Present Illness   Has Been Having some Left Arm pain/Jaw Pain  She thinks her pain is due to the way she walks  She has previously seen a cardiologist for jaw and chest pain, which she believes are symptoms more common in women than men.  She reports having these symptoms several times in recent years, with the pain starting in the arm, moving to the jaw, and then to the teeth. She denies any recent episodes of this pain.   The patient also mentions a history of hearing issues, with a recent hearing test revealing some problems in one ear. She  describes a feeling of fullness or blockage in the ear, but denies any pain. She reports that she was offered an MRI for further evaluation, but does not recall the details of this discussion.      Having More memory issues       02/13/2022   10:15 AM  MMSE - Mini Mental State Exam  Orientation to time 5  Orientation to Place 5  Registration 3  Attention/ Calculation 5  Recall 3  Language- name 2 objects 2  Language- repeat 1  Language- follow 3 step command 3  Language- read & follow direction 1  Write a sentence 1  Copy design 1  Total score 30         Past Medical History:  Diagnosis Date   Dementia (HCC)    Gait abnormality 06/20/2020   Hypertension    Hyponatremia    Iron deficiency anemia    Major depression    OA (osteoarthritis)    Pre-diabetes    Vitamin B 12 deficiency     Past Surgical History:  Procedure Laterality Date   ABDOMINAL HYSTERECTOMY     COLONOSCOPY     HEMANGIOMA EXCISION     JOINT REPLACEMENT     PATELLAR TENDON REPAIR Left 01/16/2021   Procedure: Left patellar tendon reconstruction;  Surgeon: Ollen Gross, MD;  Location: [facility];  Service: Orthopedics;  Laterality: Left;   TONSILLECTOMY  TOTAL HIP ARTHROPLASTY Right 02/18/2019   Procedure: TOTAL HIP ARTHROPLASTY ANTERIOR APPROACH;  Surgeon: Ollen Gross, MD;  Location: [facility];  Service: Orthopedics;  Laterality: Right;    TOTAL KNEE ARTHROPLASTY Right 05/27/2012   Procedure: TOTAL KNEE ARTHROPLASTY;  Surgeon: Cammy Copa, MD;  Location: [facility];  Service: Orthopedics;  Laterality: Right;  Right Total Knee Arthroplasty   TOTAL KNEE ARTHROPLASTY Left 12/12/2020   Procedure: TOTAL KNEE ARTHROPLASTY;  Surgeon: Ollen Gross, MD;  Location: [facility];  Service: Orthopedics;  Laterality: Left;   TOTAL KNEE REVISION Right 08/06/2018   Procedure: TOTAL KNEE REVISION;  Surgeon: Ollen Gross, MD;  Location: [facility];  Service: Orthopedics;  Laterality: Right;   TUBAL LIGATION       No Known Allergies  Outpatient Encounter Medications as of 05/07/2023  Medication Sig   acetaminophen (TYLENOL) 500 MG tablet Take 500 mg by mouth every 6 (six) hours as needed for moderate pain.   aspirin EC 81 MG tablet Take 81 mg by mouth daily.   cholecalciferol (VITAMIN D3) 25 MCG (1000 UT) tablet Take 1,000 Units by mouth daily.   COMBIGAN 0.2-0.5 % ophthalmic solution Place 1 drop into the right eye in the morning and at bedtime.   Cyanocobalamin (B-12) 2500 MCG TABS Take 1 tablet by mouth daily.   dorzolamide (TRUSOPT) 2 % ophthalmic solution Place 1 drop into the right eye 2 (two) times daily.    ferrous sulfate 325 (65 FE) MG tablet Take 325 mg by mouth 3 (three) times a week.   influenza vaccine adjuvanted (FLUAD QUADRIVALENT) 0.5 ML injection Inject into the muscle. (Patient not taking: Reported on 04/19/2023)   latanoprost (XALATAN) 0.005 % ophthalmic solution Place 1 drop into both eyes at bedtime.    Multiple Vitamins-Minerals (PRESERVISION AREDS 2 PO) Take 1 capsule by mouth in the morning and at bedtime.   nebivolol (BYSTOLIC) 5 MG tablet Take 1 tablet (5 mg total) by mouth daily.   olmesartan (BENICAR) 40 MG tablet TAKE 1 TABLET DAILY   No facility-administered encounter medications on file as of 05/07/2023.    Review of Systems:  Review of Systems  Constitutional:  Negative for activity change and appetite change.  HENT: Negative.    Respiratory:  Negative for cough and shortness of breath.   Cardiovascular:  Negative for leg swelling.  Gastrointestinal:  Negative for constipation.  Genitourinary: Negative.   Musculoskeletal:  Positive for arthralgias and gait problem. Negative for myalgias.  Skin: Negative.   Neurological:  Negative for dizziness and weakness.  Psychiatric/Behavioral:  Positive for confusion. Negative for dysphoric mood and sleep disturbance.     Health Maintenance  Topic Date Due   COVID-19 Vaccine (8 - 2024-25 season) 05/23/2023  (Originally 01/02/2023)   Medicare Annual Wellness (AWV)  09/12/2023 (Originally 07/21/2021)   DTaP/Tdap/Td (2 - Td or Tdap) 08/01/2031   Pneumonia Vaccine 59+ Years old  Completed   INFLUENZA VACCINE  Completed   DEXA SCAN  Completed   Zoster Vaccines- Shingrix  Completed   HPV VACCINES  Aged Out    Physical Exam: Vitals:   05/07/23 0831  Height: 5\' 4"  (1.626 m)   Body mass index is 23.86 kg/m. Physical Exam Vitals reviewed.  Constitutional:      Appearance: Normal appearance.  HENT:     Head: Normocephalic.     Right Ear: Tympanic membrane normal.     Left Ear: Tympanic membrane normal.     Nose: Nose normal.  Mouth/Throat:     Mouth: Mucous membranes are moist.     Pharynx: Oropharynx is clear.  Eyes:     Pupils: Pupils are equal, round, and reactive to light.  Cardiovascular:     Rate and Rhythm: Normal rate and regular rhythm.     Pulses: Normal pulses.     Heart sounds: Normal heart sounds. No murmur heard. Pulmonary:     Effort: Pulmonary effort is normal.     Breath sounds: Normal breath sounds.  Abdominal:     General: Abdomen is flat. Bowel sounds are normal.     Palpations: Abdomen is soft.  Musculoskeletal:        General: No swelling.     Cervical back: Neck supple.  Skin:    General: Skin is warm.  Neurological:     General: No focal deficit present.     Mental Status: She is alert.  Psychiatric:        Mood and Affect: Mood normal.        Thought Content: Thought content normal.     Labs reviewed: Basic Metabolic Panel: Recent Labs    05/24/22 0000 01/31/23 0000 03/19/23 0000  NA 139 139 139  K 4.3 4.1 4.3  CL 105 104 105  CO2 22 24* 22  BUN 20 20 19   CREATININE 0.7 0.7 0.7  CALCIUM 9.1 8.7 8.8  TSH 2.65 2.39 2.10   Liver Function Tests: Recent Labs    05/24/22 0000 01/31/23 0000 03/19/23 0000  AST 18 21 22   ALT 14 26 26   ALKPHOS 67 100 97  ALBUMIN 4.0 3.7 3.8   No results for input(s): "LIPASE", "AMYLASE" in the last  8760 hours. No results for input(s): "AMMONIA" in the last 8760 hours. CBC: Recent Labs    05/24/22 0000  WBC 4.4  HGB 12.6  HCT 38  PLT 231   Lipid Panel: Recent Labs    05/24/22 0000 03/19/23 0000  CHOL 123 109  HDL 67 63  LDLCALC 41 36  TRIG 73 48   No results found for: "HGBA1C"  Procedures since last visit: No results found.  Assessment/Plan        Assessment and Plan    Intermittent chest and jaw pain Intermittent chest and jaw pain,  Cardiologist seen She declined further work up  - Advise emergency care if pain persists. - Discuss further cardiac evaluation with cardiologist if desired.  Bursitis Bursitis in left arm due to overuse. Swelling and heat present, no significant pain or limitation. - Avoid exacerbating activities. - Adjust walker use to reduce arm pressure.  Nocturia Increased nighttime urination, possibly due to evening fluid intake, especially wine. - Advise reducing evening fluid intake, especially wine.  Hearing loss Hearing loss in one ear with fullness sensation. Previous test indicated issue, declined MRI follow-up. - Discuss further evaluation with audiologist or ENT if symptoms worsen.  General Health Maintenance Discussed vaccinations, mammograms, and bone density. Declined further mammograms. Bone density good as of September 2024. - Continue iron supplementation 3/ a week. - Consider MMSE next visit    Essential hypertension Benicar and Bystolic   Overactive bladder Refuses meds    Iron deficiency anemia, unspecified iron deficiency anemia type HGB normal now Per Dr Candise Che note She had Occult tested by her Previous PCP was negative Hgb stable on Iron and B 12 GI work up refused    B12 deficiency On Supplement   Status post total left knee replacement Cannot Ambulate  Labs/tests ordered:  No Labs needed Next appt:  Visit date not found

## 2023-05-08 ENCOUNTER — Encounter (INDEPENDENT_AMBULATORY_CARE_PROVIDER_SITE_OTHER): Payer: Medicare PPO | Admitting: Ophthalmology

## 2023-05-08 DIAGNOSIS — H35033 Hypertensive retinopathy, bilateral: Secondary | ICD-10-CM

## 2023-05-08 DIAGNOSIS — H43813 Vitreous degeneration, bilateral: Secondary | ICD-10-CM | POA: Diagnosis not present

## 2023-05-08 DIAGNOSIS — H353211 Exudative age-related macular degeneration, right eye, with active choroidal neovascularization: Secondary | ICD-10-CM

## 2023-05-08 DIAGNOSIS — H353122 Nonexudative age-related macular degeneration, left eye, intermediate dry stage: Secondary | ICD-10-CM | POA: Diagnosis not present

## 2023-05-08 DIAGNOSIS — I10 Essential (primary) hypertension: Secondary | ICD-10-CM

## 2023-06-11 DIAGNOSIS — M2012 Hallux valgus (acquired), left foot: Secondary | ICD-10-CM | POA: Diagnosis not present

## 2023-06-11 DIAGNOSIS — M2041 Other hammer toe(s) (acquired), right foot: Secondary | ICD-10-CM | POA: Diagnosis not present

## 2023-06-11 DIAGNOSIS — M2042 Other hammer toe(s) (acquired), left foot: Secondary | ICD-10-CM | POA: Diagnosis not present

## 2023-06-11 DIAGNOSIS — M79671 Pain in right foot: Secondary | ICD-10-CM | POA: Diagnosis not present

## 2023-06-11 DIAGNOSIS — M2011 Hallux valgus (acquired), right foot: Secondary | ICD-10-CM | POA: Diagnosis not present

## 2023-06-11 DIAGNOSIS — M79672 Pain in left foot: Secondary | ICD-10-CM | POA: Diagnosis not present

## 2023-06-11 DIAGNOSIS — B351 Tinea unguium: Secondary | ICD-10-CM | POA: Diagnosis not present

## 2023-07-03 ENCOUNTER — Other Ambulatory Visit: Payer: Self-pay | Admitting: Internal Medicine

## 2023-07-10 ENCOUNTER — Encounter (INDEPENDENT_AMBULATORY_CARE_PROVIDER_SITE_OTHER): Admitting: Ophthalmology

## 2023-07-10 DIAGNOSIS — H353211 Exudative age-related macular degeneration, right eye, with active choroidal neovascularization: Secondary | ICD-10-CM

## 2023-07-10 DIAGNOSIS — H353122 Nonexudative age-related macular degeneration, left eye, intermediate dry stage: Secondary | ICD-10-CM | POA: Diagnosis not present

## 2023-07-10 DIAGNOSIS — H35033 Hypertensive retinopathy, bilateral: Secondary | ICD-10-CM | POA: Diagnosis not present

## 2023-07-10 DIAGNOSIS — H43813 Vitreous degeneration, bilateral: Secondary | ICD-10-CM

## 2023-07-10 DIAGNOSIS — I10 Essential (primary) hypertension: Secondary | ICD-10-CM

## 2023-07-17 ENCOUNTER — Other Ambulatory Visit: Payer: Self-pay | Admitting: Adult Health

## 2023-07-17 DIAGNOSIS — I10 Essential (primary) hypertension: Secondary | ICD-10-CM

## 2023-08-13 ENCOUNTER — Encounter: Admitting: Internal Medicine

## 2023-09-09 ENCOUNTER — Encounter: Payer: Self-pay | Admitting: Adult Health

## 2023-09-09 ENCOUNTER — Non-Acute Institutional Stay: Admitting: Adult Health

## 2023-09-09 VITALS — BP 144/76 | HR 66 | Temp 97.7°F | Ht 64.0 in | Wt 138.0 lb

## 2023-09-09 DIAGNOSIS — F0390 Unspecified dementia without behavioral disturbance: Secondary | ICD-10-CM | POA: Diagnosis not present

## 2023-09-09 DIAGNOSIS — K5901 Slow transit constipation: Secondary | ICD-10-CM | POA: Diagnosis not present

## 2023-09-09 DIAGNOSIS — D508 Other iron deficiency anemias: Secondary | ICD-10-CM | POA: Diagnosis not present

## 2023-09-09 MED ORDER — POLYETHYLENE GLYCOL 3350 17 G PO PACK: 17.0000 g | PACK | Freq: Every day | ORAL | Status: AC

## 2023-09-09 NOTE — Progress Notes (Signed)
 Location:  [facility]  POS: Clinic  Provider: Tawni America, ANP  Code Status: DNR Goals of Care:     09/09/2023    1:35 PM  Advanced Directives  Does Patient Have a Medical Advance Directive? Yes  Type of Advance Directive Out of facility DNR (pink MOST or yellow form)  Does patient want to make changes to medical advance directive? No - Patient declined     Chief Complaint  Patient presents with   Medical Management of Chronic Issues    4 month follow up. Patient wants to speak about her pain medications.    HPI: Patient is a 89 y.o. female seen today for medical management of chronic diseases.   She underwent left patellar repair on 01/16/21 10/17-10/19/22 for left  total knee arthroplasty Continues to have issues with ambulation and uses a walker and wheelchair. Also has a PWC.  HX of IDA on iron [facility] with heme neg stool. Has seen heme/onc in the past.    Memory is worsening, SLUMS 17/30 09/09/2023 Able to dress and bathe. No wandering. Lives [place] in [place]  BP controlled  T score -2.2 osteopenia 10/2022 Mammograms aged out  Past Medical History:  Diagnosis Date   Dementia (HCC)    Gait abnormality 06/20/2020   Hypertension    Hyponatremia    Iron deficiency anemia    Major depression    OA (osteoarthritis)    Pre-diabetes    Vitamin B 12 deficiency     Past Surgical History:  Procedure Laterality Date   ABDOMINAL HYSTERECTOMY     COLONOSCOPY     HEMANGIOMA EXCISION     JOINT REPLACEMENT     PATELLAR TENDON REPAIR Left 01/16/2021   Procedure: Left patellar tendon reconstruction;  Surgeon: Melodi Lerner, MD;  Location: [facility];  Service: Orthopedics;  Laterality: Left;   TONSILLECTOMY     TOTAL HIP ARTHROPLASTY Right 02/18/2019   Procedure: TOTAL HIP ARTHROPLASTY ANTERIOR APPROACH;  Surgeon: Melodi Lerner, MD;  Location: [facility];  Service: Orthopedics;  Laterality: Right;    TOTAL KNEE ARTHROPLASTY Right 05/27/2012   Procedure:  TOTAL KNEE ARTHROPLASTY;  Surgeon: Cordella Glendia Hutchinson, MD;  Location: [facility] OR;  Service: Orthopedics;  Laterality: Right;  Right Total Knee Arthroplasty   TOTAL KNEE ARTHROPLASTY Left 12/12/2020   Procedure: TOTAL KNEE ARTHROPLASTY;  Surgeon: Melodi Lerner, MD;  Location: [facility];  Service: Orthopedics;  Laterality: Left;   TOTAL KNEE REVISION Right 08/06/2018   Procedure: TOTAL KNEE REVISION;  Surgeon: Melodi Lerner, MD;  Location: [facility];  Service: Orthopedics;  Laterality: Right;   TUBAL LIGATION      No Known Allergies  Outpatient Encounter Medications as of 09/09/2023  Medication Sig   acetaminophen  (TYLENOL ) 500 MG tablet Take 500 mg by mouth every 6 (six) hours as needed for moderate pain.   aspirin  EC 81 MG tablet Take 81 mg by mouth daily.   cholecalciferol (VITAMIN D3) 25 MCG (1000 UT) tablet Take 1,000 Units by mouth daily.   COMBIGAN  0.2-0.5 % ophthalmic solution Place 1 drop into the right eye in the morning and at bedtime.   Cyanocobalamin (B-12) 2500 MCG TABS Take 1 tablet by mouth daily.   dorzolamide  (TRUSOPT ) 2 % ophthalmic solution Place 1 drop into the right eye 2 (two) times daily.    ferrous sulfate  325 (65 FE) MG tablet Take 325 mg by mouth 3 (three) times a week.   influenza vaccine adjuvanted (FLUAD  QUADRIVALENT) 0.5 ML  injection Inject into the muscle.   latanoprost  (XALATAN ) 0.005 % ophthalmic solution Place 1 drop into both eyes at bedtime.    Multiple Vitamins-Minerals (PRESERVISION AREDS 2 PO) Take 1 capsule by mouth in the morning and at bedtime.   nebivolol  (BYSTOLIC ) 5 MG tablet TAKE 1 TABLET EVERY DAY   olmesartan  (BENICAR ) 40 MG tablet TAKE 1 TABLET EVERY DAY   No facility-administered encounter medications on file as of 09/09/2023.    Review of Systems:  Review of Systems  Constitutional:  Negative for activity change, appetite change, chills, diaphoresis, fatigue, fever and unexpected weight change.  HENT:  Positive for hearing loss and sneezing.  Negative for congestion.   Respiratory:  Negative for cough, shortness of breath and wheezing.   Cardiovascular:  Negative for chest pain, palpitations and leg swelling.  Gastrointestinal:  Negative for abdominal distention, abdominal pain, constipation and diarrhea.  Genitourinary:  Negative for difficulty urinating, dysuria, flank pain and hematuria.  Musculoskeletal:  Positive for gait problem. Negative for arthralgias, back pain, joint swelling and myalgias.       Right knee has some pain at times mild.   Neurological:  Negative for dizziness, tremors, seizures, syncope, facial asymmetry, speech difficulty, weakness, light-headedness, numbness and headaches.  Psychiatric/Behavioral:  Negative for agitation, behavioral problems and confusion.     Health Maintenance  Topic Date Due   COVID-19 Vaccine (8 - Mixed Product risk 2024-25 season) 05/07/2023   Medicare Annual Wellness (AWV)  09/12/2023 (Originally 07/21/2021)   INFLUENZA VACCINE  09/27/2023   DTaP/Tdap/Td (2 - Td or Tdap) 08/01/2031   Pneumococcal Vaccine: 50+ Years  Completed   DEXA SCAN  Completed   Zoster Vaccines- Shingrix  Completed   Hepatitis B Vaccines  Aged Out   HPV VACCINES  Aged Out   Meningococcal B Vaccine  Aged Out    Physical Exam: Vitals:   09/09/23 1325 09/09/23 1329  BP: (!) 140/90 (!) 144/76  Pulse: 66   Temp: 97.7 F (36.5 C)   SpO2: 96%   Weight: 138 lb (62.6 kg)   Height: 5' 4 (1.626 m)     Body mass index is 23.69 kg/m. Wt Readings from Last 3 Encounters:  09/09/23 138 lb (62.6 kg)  05/07/23 138 lb (62.6 kg)  04/19/23 139 lb (63 kg)    Physical Exam Vitals and nursing note reviewed.  Constitutional:      General: She is not in acute distress.    Appearance: She is not diaphoretic.  HENT:     Head: Normocephalic and atraumatic.     Right Ear: Tympanic membrane normal.     Left Ear: Tympanic membrane normal.     Nose: Nose normal.     Mouth/Throat:     Mouth: Mucous membranes are  moist.     Pharynx: Oropharynx is clear.  Eyes:     Conjunctiva/sclera: Conjunctivae normal.     Pupils: Pupils are equal, round, and reactive to light.  Neck:     Vascular: No JVD.  Cardiovascular:     Rate and Rhythm: Normal rate and regular rhythm.     Heart sounds: No murmur heard. Pulmonary:     Effort: Pulmonary effort is normal. No respiratory distress.     Breath sounds: Normal breath sounds. No wheezing.  Abdominal:     General: Bowel sounds are normal. There is no distension.     Palpations: Abdomen is soft.  Musculoskeletal:     Cervical back: No rigidity or tenderness.  Comments: Trace edema to both ankles.   Lymphadenopathy:     Cervical: No cervical adenopathy.  Skin:    General: Skin is warm and dry.     Comments: Thick great toenails  Neurological:     General: No focal deficit present.     Mental Status: She is alert. Mental status is at baseline.  Psychiatric:        Mood and Affect: Mood normal.     Labs reviewed: Basic Metabolic Panel: Recent Labs    01/31/23 0000 03/19/23 0000  NA 139 139  K 4.1 4.3  CL 104 105  CO2 24* 22  BUN 20 19  CREATININE 0.7 0.7  CALCIUM 8.7 8.8  TSH 2.39 2.10   Liver Function Tests: Recent Labs    01/31/23 0000 03/19/23 0000  AST 21 22  ALT 26 26  ALKPHOS 100 97  ALBUMIN 3.7 3.8   No results for input(s): LIPASE, AMYLASE in the last 8760 hours. No results for input(s): AMMONIA in the last 8760 hours. CBC: Recent Labs    03/19/23 0000  WBC 6.1  HGB 12.3  HCT 36  PLT 288   Lipid Panel: Recent Labs    03/19/23 0000  CHOL 109  HDL 63  LDLCALC 36  TRIG 48   No results found for: HGBA1C No results found.  Assessment/Plan   1. Essential hypertension Controlled Continue benicar  and bystolic    2. Gait instability  Using PWC most of the time due to prior failed knee TKA   3. Dementia SLUMS 17/30 09/09/23 Memory is progressing Her husband feels they are still managing well at  home Could consider namenda at future visit  4. Other iron deficiency anemia Check CBC    Labs/tests ordered:  * No order type specified * CBC CMP 09/12/23 Discussed with her husband when he came by after th evisit.  Next appt:  3 months with Dr Charlanne    Total time :  time greater than 50% of total time spent doing pt counseling and coordination of care

## 2023-09-09 NOTE — Patient Instructions (Signed)
 Please bring your pill bottles to your next apt

## 2023-09-11 ENCOUNTER — Encounter (INDEPENDENT_AMBULATORY_CARE_PROVIDER_SITE_OTHER): Admitting: Ophthalmology

## 2023-09-11 DIAGNOSIS — H353211 Exudative age-related macular degeneration, right eye, with active choroidal neovascularization: Secondary | ICD-10-CM

## 2023-09-11 DIAGNOSIS — H35033 Hypertensive retinopathy, bilateral: Secondary | ICD-10-CM | POA: Diagnosis not present

## 2023-09-11 DIAGNOSIS — I10 Essential (primary) hypertension: Secondary | ICD-10-CM

## 2023-09-11 DIAGNOSIS — H353122 Nonexudative age-related macular degeneration, left eye, intermediate dry stage: Secondary | ICD-10-CM

## 2023-09-11 DIAGNOSIS — H43813 Vitreous degeneration, bilateral: Secondary | ICD-10-CM

## 2023-09-12 DIAGNOSIS — E785 Hyperlipidemia, unspecified: Secondary | ICD-10-CM | POA: Diagnosis not present

## 2023-09-12 DIAGNOSIS — I10 Essential (primary) hypertension: Secondary | ICD-10-CM | POA: Diagnosis not present

## 2023-09-12 LAB — COMPREHENSIVE METABOLIC PANEL WITH GFR
Calcium: 8.9 (ref 8.7–10.7)
Calcium: 8.9 (ref 8.7–10.7)
eGFR: 84

## 2023-09-12 LAB — CBC AND DIFFERENTIAL
HCT: 34 — AB (ref 36–46)
Hemoglobin: 11.1 — AB (ref 12.0–16.0)
Platelets: 342 K/uL (ref 150–400)
WBC: 6

## 2023-09-12 LAB — BASIC METABOLIC PANEL WITH GFR
BUN: 18 (ref 4–21)
CO2: 23 — AB (ref 13–22)
Chloride: 103 (ref 99–108)
Creatinine: 0.7 (ref 0.5–1.1)
Glucose: 126
Glucose: 126
Potassium: 4 meq/L (ref 3.5–5.1)
Sodium: 138 (ref 137–147)
Sodium: 138 (ref 137–147)

## 2023-09-12 LAB — CBC: RBC: 3.68 — AB (ref 3.87–5.11)

## 2023-11-12 DIAGNOSIS — Z961 Presence of intraocular lens: Secondary | ICD-10-CM | POA: Diagnosis not present

## 2023-11-12 DIAGNOSIS — H401131 Primary open-angle glaucoma, bilateral, mild stage: Secondary | ICD-10-CM | POA: Diagnosis not present

## 2023-11-12 DIAGNOSIS — H353121 Nonexudative age-related macular degeneration, left eye, early dry stage: Secondary | ICD-10-CM | POA: Diagnosis not present

## 2023-11-13 ENCOUNTER — Encounter (INDEPENDENT_AMBULATORY_CARE_PROVIDER_SITE_OTHER): Admitting: Ophthalmology

## 2023-11-13 DIAGNOSIS — H353122 Nonexudative age-related macular degeneration, left eye, intermediate dry stage: Secondary | ICD-10-CM

## 2023-11-13 DIAGNOSIS — H353211 Exudative age-related macular degeneration, right eye, with active choroidal neovascularization: Secondary | ICD-10-CM

## 2023-11-13 DIAGNOSIS — I10 Essential (primary) hypertension: Secondary | ICD-10-CM | POA: Diagnosis not present

## 2023-11-13 DIAGNOSIS — H35033 Hypertensive retinopathy, bilateral: Secondary | ICD-10-CM

## 2023-11-13 DIAGNOSIS — H43813 Vitreous degeneration, bilateral: Secondary | ICD-10-CM | POA: Diagnosis not present

## 2023-11-27 ENCOUNTER — Other Ambulatory Visit: Payer: Self-pay | Admitting: Internal Medicine

## 2023-12-03 DIAGNOSIS — B351 Tinea unguium: Secondary | ICD-10-CM | POA: Diagnosis not present

## 2023-12-03 DIAGNOSIS — M79672 Pain in left foot: Secondary | ICD-10-CM | POA: Diagnosis not present

## 2023-12-03 DIAGNOSIS — M79671 Pain in right foot: Secondary | ICD-10-CM | POA: Diagnosis not present

## 2023-12-18 ENCOUNTER — Other Ambulatory Visit (HOSPITAL_BASED_OUTPATIENT_CLINIC_OR_DEPARTMENT_OTHER): Payer: Self-pay

## 2023-12-18 MED ORDER — FLUZONE HIGH-DOSE 0.5 ML IM SUSY
0.5000 mL | PREFILLED_SYRINGE | Freq: Once | INTRAMUSCULAR | 0 refills | Status: AC
  Filled 2023-12-18: qty 0.5, 1d supply, fill #0

## 2024-01-06 ENCOUNTER — Other Ambulatory Visit: Payer: Self-pay | Admitting: Internal Medicine

## 2024-01-06 DIAGNOSIS — I10 Essential (primary) hypertension: Secondary | ICD-10-CM

## 2024-01-07 ENCOUNTER — Encounter: Admitting: Internal Medicine

## 2024-01-14 ENCOUNTER — Encounter (INDEPENDENT_AMBULATORY_CARE_PROVIDER_SITE_OTHER): Admitting: Ophthalmology

## 2024-01-14 DIAGNOSIS — H353211 Exudative age-related macular degeneration, right eye, with active choroidal neovascularization: Secondary | ICD-10-CM

## 2024-01-14 DIAGNOSIS — H43813 Vitreous degeneration, bilateral: Secondary | ICD-10-CM

## 2024-01-14 DIAGNOSIS — H353122 Nonexudative age-related macular degeneration, left eye, intermediate dry stage: Secondary | ICD-10-CM

## 2024-01-14 DIAGNOSIS — I10 Essential (primary) hypertension: Secondary | ICD-10-CM

## 2024-01-14 DIAGNOSIS — H35033 Hypertensive retinopathy, bilateral: Secondary | ICD-10-CM

## 2024-01-21 ENCOUNTER — Non-Acute Institutional Stay: Payer: Self-pay | Admitting: Internal Medicine

## 2024-01-21 ENCOUNTER — Encounter: Payer: Self-pay | Admitting: Internal Medicine

## 2024-01-21 VITALS — BP 126/80 | HR 65 | Temp 97.9°F | Ht 64.0 in | Wt 138.0 lb

## 2024-01-21 DIAGNOSIS — F0390 Unspecified dementia without behavioral disturbance: Secondary | ICD-10-CM

## 2024-01-21 DIAGNOSIS — D508 Other iron deficiency anemias: Secondary | ICD-10-CM

## 2024-01-21 DIAGNOSIS — I10 Essential (primary) hypertension: Secondary | ICD-10-CM | POA: Diagnosis not present

## 2024-01-21 DIAGNOSIS — M79602 Pain in left arm: Secondary | ICD-10-CM | POA: Diagnosis not present

## 2024-01-21 NOTE — Patient Instructions (Addendum)
 You need to get Prevnar 20 and RSV  vaccine  Can use OTC Rewetting Drops for your Eyes

## 2024-01-21 NOTE — Progress Notes (Signed)
 Location:  [facility] of Service:  Clinic (12)  Provider:   Code Status:  Goals of Care:     09/09/2023    1:35 PM  Advanced Directives  Does Patient Have a Medical Advance Directive? Yes  Type of Advance Directive Out of facility DNR (pink MOST or yellow form)  Does patient want to make changes to medical advance directive? No - Patient declined     Chief Complaint  Patient presents with   Medical Management of Chronic Issues    HTN Patient has concerns about left arm hurting. Patient has concerns about arthritis.    HPI: Patient is a 89 y.o. female seen today for medical management of chronic diseases.    [place] in [place]   Patient has a history of left TKA, history of hyponatremia  Patient also has history of anemia multifactorial due to iron deficiency and B12  Has Seen Hematology before  Urinary incontinence    S/p Left TKA Patient is not able to ambulate anymore due to weakness in that leg  Uses Power chair and Wheelchair  Manages with the help of her husband  Was seen by cardiologist for intermittent chest and jaw pain but declined further workup  MCI She had Slums done last visit 17/30 It  Shows Progressive Dementia  Discussed the use of AI scribe software for clinical note transcription with the patient, who gave verbal consent to proceed.  History of Present Illness   Amy Carroll is an 89 year old female who presents with pain in her hands and left forearm, and concerns about skin discoloration.  Pain in her Left Arm She has morning aching pain in both hands that improves by afternoon and takes Tylenol  for relief, though she is unsure of the dose. She has left forearm pain from elbow to forearm when using her walker, which limits walker use for short distances and does not affect the right arm. Large Bruises She is concerned about large, dark bruises on her legs and arms that she  notices on waking. She takes aspirin  and is worried these bruises may be related.  She gets up to use the bathroom two to three times a night, which is less frequent than before.         Past Medical History:  Diagnosis Date   Dementia (HCC)    Gait abnormality 06/20/2020   Hypertension    Hyponatremia    Iron deficiency anemia    Major depression    OA (osteoarthritis)    Pre-diabetes    Vitamin B 12 deficiency     Past Surgical History:  Procedure Laterality Date   ABDOMINAL HYSTERECTOMY     COLONOSCOPY     HEMANGIOMA EXCISION     JOINT REPLACEMENT     PATELLAR TENDON REPAIR Left 01/16/2021   Procedure: Left patellar tendon reconstruction;  Surgeon: Melodi Lerner, MD;  Location: [facility];  Service: Orthopedics;  Laterality: Left;   TONSILLECTOMY     TOTAL HIP ARTHROPLASTY Right 02/18/2019   Procedure: TOTAL HIP ARTHROPLASTY ANTERIOR APPROACH;  Surgeon: Melodi Lerner, MD;  Location: [facility];  Service: Orthopedics;  Laterality: Right;    TOTAL KNEE ARTHROPLASTY Right 05/27/2012   Procedure: TOTAL KNEE ARTHROPLASTY;  Surgeon: Cordella Glendia Hutchinson, MD;  Location: [facility];  Service: Orthopedics;  Laterality: Right;  Right Total Knee Arthroplasty   TOTAL KNEE ARTHROPLASTY Left 12/12/2020   Procedure: TOTAL KNEE ARTHROPLASTY;  Surgeon: Melodi Lerner, MD;  Location: [facility];  Service: Orthopedics;  Laterality: Left;   TOTAL KNEE REVISION Right 08/06/2018   Procedure: TOTAL KNEE REVISION;  Surgeon: Melodi Lerner, MD;  Location: [facility];  Service: Orthopedics;  Laterality: Right;   TUBAL LIGATION      No Known Allergies  Outpatient Encounter Medications as of 01/21/2024  Medication Sig   acetaminophen  (TYLENOL ) 500 MG tablet Take 500 mg by mouth every 6 (six) hours as needed for moderate pain.   aspirin  EC 81 MG tablet Take 81 mg by mouth daily.   cholecalciferol (VITAMIN D3) 25 MCG (1000 UT) tablet Take 1,000 Units by mouth daily.   COMBIGAN  0.2-0.5 % ophthalmic solution Place 1  drop into the right eye in the morning and at bedtime.   Cyanocobalamin (B-12) 2500 MCG TABS Take 1 tablet by mouth daily.   dorzolamide  (TRUSOPT ) 2 % ophthalmic solution Place 1 drop into the right eye 2 (two) times daily.    ferrous sulfate  325 (65 FE) MG tablet Take 325 mg by mouth 3 (three) times a week.   influenza vaccine adjuvanted (FLUAD  QUADRIVALENT) 0.5 ML injection Inject into the muscle.   latanoprost  (XALATAN ) 0.005 % ophthalmic solution Place 1 drop into both eyes at bedtime.    Multiple Vitamins-Minerals (PRESERVISION AREDS 2 PO) Take 1 capsule by mouth in the morning and at bedtime.   nebivolol  (BYSTOLIC ) 5 MG tablet TAKE 1 TABLET EVERY DAY   olmesartan  (BENICAR ) 40 MG tablet TAKE 1 TABLET EVERY DAY   polyethylene glycol (MIRALAX ) 17 g packet Take 17 g by mouth daily. (Patient not taking: Reported on 01/21/2024)   No facility-administered encounter medications on file as of 01/21/2024.    Review of Systems:  Review of Systems  Constitutional:  Negative for activity change and appetite change.  HENT: Negative.    Respiratory:  Negative for cough and shortness of breath.   Cardiovascular:  Negative for leg swelling.  Gastrointestinal:  Negative for constipation.  Genitourinary: Negative.   Musculoskeletal:  Positive for arthralgias and gait problem. Negative for myalgias.  Skin: Negative.   Neurological:  Negative for dizziness and weakness.  Psychiatric/Behavioral:  Positive for confusion. Negative for dysphoric mood and sleep disturbance.     Health Maintenance  Topic Date Due   Medicare Annual Wellness (AWV)  07/21/2021   COVID-19 Vaccine [id number] - 2025-26 season) 05/25/2024   DTaP/Tdap/Td (2 - Td or Tdap) 08/01/2031   Pneumococcal Vaccine: 50+ Years  Completed   Influenza Vaccine  Completed   Bone Density Scan  Completed   Zoster Vaccines- Shingrix  Completed   Meningococcal B Vaccine  Aged Out    Physical Exam: Vitals:   01/21/24 1156  BP: 126/80  Pulse: 65   Temp: 97.9 F (36.6 C)  SpO2: 97%  Weight: 138 lb (62.6 kg)  Height: 5' 4 (1.626 m)   Body mass index is 23.69 kg/m. Physical Exam Vitals reviewed.  Constitutional:      Appearance: Normal appearance.  HENT:     Head: Normocephalic.     Nose: Nose normal.     Mouth/Throat:     Mouth: Mucous membranes are moist.     Pharynx: Oropharynx is clear.  Eyes:     Pupils: Pupils are equal, round, and reactive to light.  Cardiovascular:     Rate and Rhythm: Normal rate and regular rhythm.     Pulses: Normal pulses.     Heart sounds: Normal heart sounds. No murmur heard. Pulmonary:  Effort: Pulmonary effort is normal.     Breath sounds: Normal breath sounds.  Abdominal:     General: Abdomen is flat. Bowel sounds are normal.     Palpations: Abdomen is soft.  Musculoskeletal:        General: No swelling.     Cervical back: Neck supple.     Comments: Left Arm Exam was normal She was not tender in her Left Arm   Skin:    General: Skin is warm.     Comments: Chronic Changes in Skin with Discoloration with Some Bruises  Neurological:     Mental Status: She is alert.  Psychiatric:        Mood and Affect: Mood normal.        Thought Content: Thought content normal.     Labs reviewed: Basic Metabolic Panel: Recent Labs    01/31/23 0000 03/19/23 0000 09/12/23 0000  NA 139 139 138  138  K 4.1 4.3 4.0  CL 104 105 103  CO2 24* 22 23*  BUN 20 19 18   CREATININE 0.7 0.7 0.7  CALCIUM 8.7 8.8 8.9  8.9  TSH 2.39 2.10  --    Liver Function Tests: Recent Labs    01/31/23 0000 03/19/23 0000  AST 21 22  ALT 26 26  ALKPHOS 100 97  ALBUMIN 3.7 3.8   No results for input(s): LIPASE, AMYLASE in the last 8760 hours. No results for input(s): AMMONIA in the last 8760 hours. CBC: Recent Labs    03/19/23 0000 09/12/23 0000  WBC 6.1 6.0  HGB 12.3 11.1*  HCT 36 34*  PLT 288 342   Lipid Panel: Recent Labs    03/19/23 0000  CHOL 109  HDL 63  LDLCALC 36  TRIG  48   No results found for: HGBA1C  Procedures since last visit: No results found.  Assessment/Plan 1. Essential hypertension (Primary) Controlled on Benicar  and Bystolic   2. Other iron deficiency anemia HGB stable On Iron    3. Dementia without behavioral disturbance (HCC) Slums 17/30 Managing with help of her husband       4 Musculoskeletal pain of hands and left forearm Chronic musculoskeletal pain in hands and left forearm, exacerbated by walker use. Muscular pain, no joint involvement or numbness. Relieved by acetaminophen . - Continue acetaminophen  as needed, up to four tablets per day, minimum six hours between doses. - Consider referral to physical therapy if pain persists or worsens.  5 Chronic skin discoloration of arms and legs Chronic skin discoloration due to aging and aspirin  use. No acute intervention needed. - Advised wearing long pants to cover discoloration.  6 Nocturia Improved, current frequency two to three times per night.  7 Dry eyes with morning crusting Dry eyes with morning crusting likely due to age-related tear production changes. Current drops for macular degeneration and glaucoma. - Recommended over-the-counter rewetting drops such as Systane.   General Health Maintenance Up to date on COVID and flu vaccinations. Due for pneumonia and RSV vaccinations. Discussed RSV vaccine benefits in preventing severe infections in elderly. - Administer Prevnar 20 pneumonia vaccine. - Administer RSV vaccine. - Ensure vaccinations are covered by insurance before administration.       Labs/tests ordered:  CBC,CMP,TSH,Vit D and B12  Next appt:  05/18/2024

## 2024-03-25 ENCOUNTER — Encounter (INDEPENDENT_AMBULATORY_CARE_PROVIDER_SITE_OTHER): Admitting: Ophthalmology

## 2024-03-25 DIAGNOSIS — H353122 Nonexudative age-related macular degeneration, left eye, intermediate dry stage: Secondary | ICD-10-CM

## 2024-03-25 DIAGNOSIS — H43813 Vitreous degeneration, bilateral: Secondary | ICD-10-CM | POA: Diagnosis not present

## 2024-03-25 DIAGNOSIS — H353211 Exudative age-related macular degeneration, right eye, with active choroidal neovascularization: Secondary | ICD-10-CM | POA: Diagnosis not present

## 2024-03-25 DIAGNOSIS — H35033 Hypertensive retinopathy, bilateral: Secondary | ICD-10-CM | POA: Diagnosis not present

## 2024-03-25 DIAGNOSIS — I10 Essential (primary) hypertension: Secondary | ICD-10-CM

## 2024-05-18 ENCOUNTER — Encounter: Admitting: Adult Health

## 2024-06-03 ENCOUNTER — Encounter (INDEPENDENT_AMBULATORY_CARE_PROVIDER_SITE_OTHER): Admitting: Ophthalmology
